# Patient Record
Sex: Female | Born: 1935 | Race: White | Hispanic: No | Smoking: Former smoker
Health system: Southern US, Community
[De-identification: ages and names within clinical notes are randomized; demographics above are authoritative.]

## PROBLEM LIST (undated history)

## (undated) DIAGNOSIS — R0600 Dyspnea, unspecified: Secondary | ICD-10-CM

## (undated) DIAGNOSIS — F419 Anxiety disorder, unspecified: Secondary | ICD-10-CM

## (undated) DIAGNOSIS — I714 Abdominal aortic aneurysm, without rupture, unspecified: Secondary | ICD-10-CM

## (undated) DIAGNOSIS — M1990 Unspecified osteoarthritis, unspecified site: Secondary | ICD-10-CM

## (undated) DIAGNOSIS — I10 Essential (primary) hypertension: Secondary | ICD-10-CM

## (undated) DIAGNOSIS — J302 Other seasonal allergic rhinitis: Secondary | ICD-10-CM

## (undated) DIAGNOSIS — Z8709 Personal history of other diseases of the respiratory system: Secondary | ICD-10-CM

## (undated) DIAGNOSIS — I499 Cardiac arrhythmia, unspecified: Secondary | ICD-10-CM

## (undated) DIAGNOSIS — E785 Hyperlipidemia, unspecified: Secondary | ICD-10-CM

## (undated) HISTORY — PX: ABDOMINAL AORTIC ANEURYSM REPAIR: SUR1152

## (undated) HISTORY — PX: INCONTINENCE SURGERY: SHX676

## (undated) HISTORY — PX: OTHER SURGICAL HISTORY: SHX169

## (undated) HISTORY — DX: Abdominal aortic aneurysm, without rupture, unspecified: I71.40

## (undated) HISTORY — PX: CARDIAC CATHETERIZATION: SHX172

## (undated) HISTORY — DX: Abdominal aortic aneurysm, without rupture: I71.4

---

## 1998-04-27 ENCOUNTER — Ambulatory Visit (HOSPITAL_COMMUNITY): Admission: RE | Admit: 1998-04-27 | Discharge: 1998-04-27 | Payer: Self-pay | Admitting: Unknown Physician Specialty

## 2006-04-16 ENCOUNTER — Encounter: Admission: RE | Admit: 2006-04-16 | Discharge: 2006-04-16 | Payer: Self-pay | Admitting: Vascular Surgery

## 2006-10-08 ENCOUNTER — Ambulatory Visit: Payer: Self-pay | Admitting: Vascular Surgery

## 2006-10-08 ENCOUNTER — Encounter: Admission: RE | Admit: 2006-10-08 | Discharge: 2006-10-08 | Payer: Self-pay | Admitting: Vascular Surgery

## 2007-04-07 ENCOUNTER — Ambulatory Visit: Payer: Self-pay | Admitting: Vascular Surgery

## 2007-09-01 ENCOUNTER — Ambulatory Visit: Payer: Self-pay | Admitting: Vascular Surgery

## 2008-03-15 ENCOUNTER — Encounter: Admission: RE | Admit: 2008-03-15 | Discharge: 2008-03-15 | Payer: Self-pay | Admitting: Vascular Surgery

## 2008-03-15 ENCOUNTER — Ambulatory Visit: Payer: Self-pay | Admitting: Vascular Surgery

## 2008-04-21 ENCOUNTER — Ambulatory Visit: Payer: Self-pay | Admitting: *Deleted

## 2008-05-02 ENCOUNTER — Ambulatory Visit: Admission: RE | Admit: 2008-05-02 | Discharge: 2008-05-02 | Payer: Self-pay | Admitting: *Deleted

## 2008-05-05 ENCOUNTER — Ambulatory Visit: Payer: Self-pay | Admitting: *Deleted

## 2008-07-11 ENCOUNTER — Ambulatory Visit (HOSPITAL_COMMUNITY): Admission: RE | Admit: 2008-07-11 | Discharge: 2008-07-11 | Payer: Self-pay | Admitting: Vascular Surgery

## 2008-07-11 ENCOUNTER — Ambulatory Visit: Payer: Self-pay | Admitting: Vascular Surgery

## 2008-08-31 ENCOUNTER — Inpatient Hospital Stay (HOSPITAL_COMMUNITY): Admission: RE | Admit: 2008-08-31 | Discharge: 2008-09-06 | Payer: Self-pay | Admitting: *Deleted

## 2008-08-31 ENCOUNTER — Ambulatory Visit: Payer: Self-pay | Admitting: Vascular Surgery

## 2008-08-31 ENCOUNTER — Encounter (INDEPENDENT_AMBULATORY_CARE_PROVIDER_SITE_OTHER): Payer: Self-pay | Admitting: *Deleted

## 2008-09-13 ENCOUNTER — Ambulatory Visit: Payer: Self-pay | Admitting: Vascular Surgery

## 2008-10-25 ENCOUNTER — Ambulatory Visit: Payer: Self-pay | Admitting: Vascular Surgery

## 2008-12-13 ENCOUNTER — Ambulatory Visit (HOSPITAL_BASED_OUTPATIENT_CLINIC_OR_DEPARTMENT_OTHER): Admission: RE | Admit: 2008-12-13 | Discharge: 2008-12-13 | Payer: Self-pay | Admitting: Urology

## 2009-04-02 ENCOUNTER — Emergency Department (HOSPITAL_BASED_OUTPATIENT_CLINIC_OR_DEPARTMENT_OTHER): Admission: EM | Admit: 2009-04-02 | Discharge: 2009-04-02 | Payer: Self-pay | Admitting: Emergency Medicine

## 2009-04-02 ENCOUNTER — Ambulatory Visit: Payer: Self-pay | Admitting: Diagnostic Radiology

## 2009-10-26 ENCOUNTER — Ambulatory Visit: Payer: Self-pay | Admitting: Vascular Surgery

## 2010-10-04 LAB — BASIC METABOLIC PANEL
BUN: 13 mg/dL (ref 6–23)
CO2: 27 mEq/L (ref 19–32)
CO2: 28 mEq/L (ref 19–32)
Calcium: 7.4 mg/dL — ABNORMAL LOW (ref 8.4–10.5)
Calcium: 7.6 mg/dL — ABNORMAL LOW (ref 8.4–10.5)
Chloride: 102 mEq/L (ref 96–112)
Chloride: 102 mEq/L (ref 96–112)
Creatinine, Ser: 0.75 mg/dL (ref 0.4–1.2)
Creatinine, Ser: 0.88 mg/dL (ref 0.4–1.2)
GFR calc Af Amer: >60 mL/min (ref >60)
GFR calc Af Amer: >60 mL/min (ref >60)
GFR calc Af Amer: >60 mL/min (ref >60)
Glucose, Bld: 125 mg/dL — ABNORMAL HIGH (ref 70–99)
Potassium: 3.2 mEq/L — ABNORMAL LOW (ref 3.5–5.1)
Sodium: 135 mEq/L (ref 135–145)

## 2010-10-04 LAB — GLUCOSE, CAPILLARY
Glucose-Capillary: 106 mg/dL — ABNORMAL HIGH (ref 70–99)
Glucose-Capillary: 113 mg/dL — ABNORMAL HIGH (ref 70–99)
Glucose-Capillary: 125 mg/dL — ABNORMAL HIGH (ref 70–99)
Glucose-Capillary: 142 mg/dL — ABNORMAL HIGH (ref 70–99)
Glucose-Capillary: 162 mg/dL — ABNORMAL HIGH (ref 70–99)
Glucose-Capillary: 179 mg/dL — ABNORMAL HIGH (ref 70–99)
Glucose-Capillary: 208 mg/dL — ABNORMAL HIGH (ref 70–99)
Glucose-Capillary: 97 mg/dL (ref 70–99)

## 2010-10-04 LAB — BLOOD GAS, ARTERIAL
Bicarbonate: 26.7 mEq/L — ABNORMAL HIGH (ref 20.0–24.0)
FIO2: 0.21 %
pCO2 arterial: 36.1 mmHg (ref 35.0–45.0)
pH, Arterial: 7.482 — ABNORMAL HIGH (ref 7.350–7.400)
pO2, Arterial: 68.6 mmHg — ABNORMAL LOW (ref 80.0–100.0)

## 2010-10-04 LAB — COMPREHENSIVE METABOLIC PANEL
ALT: 14 U/L (ref 0–35)
Alkaline Phosphatase: 39 U/L (ref 39–117)
Alkaline Phosphatase: 63 U/L (ref 39–117)
BUN: 11 mg/dL (ref 6–23)
CO2: 23 mEq/L (ref 19–32)
CO2: 24 mEq/L (ref 19–32)
Chloride: 100 mEq/L (ref 96–112)
Creatinine, Ser: 0.84 mg/dL (ref 0.4–1.2)
GFR calc non Af Amer: >60 mL/min (ref >60)
Glucose, Bld: 101 mg/dL — ABNORMAL HIGH (ref 70–99)
Glucose, Bld: 199 mg/dL — ABNORMAL HIGH (ref 70–99)
Potassium: 3 mEq/L — ABNORMAL LOW (ref 3.5–5.1)
Potassium: 3.6 mEq/L (ref 3.5–5.1)
Sodium: 137 mEq/L (ref 135–145)
Total Bilirubin: 0.7 mg/dL (ref 0.3–1.2)
Total Protein: 6.5 g/dL (ref 6.0–8.3)

## 2010-10-04 LAB — TYPE AND SCREEN

## 2010-10-04 LAB — POCT I-STAT 3, ART BLOOD GAS (G3+)
Bicarbonate: 23.2 mEq/L (ref 20.0–24.0)
O2 Saturation: 97 %
Patient temperature: 35.6
TCO2: 24 mmol/L (ref 0–100)
pCO2 arterial: 33.9 mmHg — ABNORMAL LOW (ref 35.0–45.0)
pCO2 arterial: 40.9 mmHg (ref 35.0–45.0)
pH, Arterial: 7.389 (ref 7.350–7.400)
pH, Arterial: 7.443 — ABNORMAL HIGH (ref 7.350–7.400)
pO2, Arterial: 102 mmHg — ABNORMAL HIGH (ref 80.0–100.0)
pO2, Arterial: 65 mmHg — ABNORMAL LOW (ref 80.0–100.0)

## 2010-10-04 LAB — CBC
HCT: 26.5 % — ABNORMAL LOW (ref 36.0–46.0)
HCT: 33.2 % — ABNORMAL LOW (ref 36.0–46.0)
Hemoglobin: 11.8 g/dL — ABNORMAL LOW (ref 12.0–15.0)
Hemoglobin: 14.3 g/dL (ref 12.0–15.0)
Hemoglobin: 9.2 g/dL — ABNORMAL LOW (ref 12.0–15.0)
MCHC: 34.8 g/dL (ref 30.0–36.0)
MCHC: 35.3 g/dL (ref 30.0–36.0)
MCHC: 35.3 g/dL (ref 30.0–36.0)
MCV: 88.8 fL (ref 78.0–100.0)
MCV: 89.4 fL (ref 78.0–100.0)
MCV: 90.3 fL (ref 78.0–100.0)
Platelets: 165 10*3/uL (ref 150–400)
RBC: 2.94 MIL/uL — ABNORMAL LOW (ref 3.87–5.11)
RBC: 3.38 MIL/uL — ABNORMAL LOW (ref 3.87–5.11)
RBC: 3.73 MIL/uL — ABNORMAL LOW (ref 3.87–5.11)
RBC: 3.8 MIL/uL — ABNORMAL LOW (ref 3.87–5.11)
RBC: 4.55 MIL/uL (ref 3.87–5.11)
RDW: 13.1 % (ref 11.5–15.5)
RDW: 13.8 % (ref 11.5–15.5)
WBC: 10.9 10*3/uL — ABNORMAL HIGH (ref 4.0–10.5)
WBC: 11.7 10*3/uL — ABNORMAL HIGH (ref 4.0–10.5)
WBC: 14.6 10*3/uL — ABNORMAL HIGH (ref 4.0–10.5)

## 2010-10-04 LAB — APTT
aPTT: 34 seconds (ref 24–37)
aPTT: 38 seconds — ABNORMAL HIGH (ref 24–37)

## 2010-10-04 LAB — POCT I-STAT 7, (LYTES, BLD GAS, ICA,H+H)
Acid-Base Excess: 2 mmol/L (ref 0.0–2.0)
Bicarbonate: 27.5 mEq/L — ABNORMAL HIGH (ref 20.0–24.0)
O2 Saturation: 100 %
Sodium: 137 mEq/L (ref 135–145)
TCO2: 29 mmol/L (ref 0–100)

## 2010-10-04 LAB — PROTIME-INR
INR: 1.1 (ref 0.00–1.49)
INR: 1.2 (ref 0.00–1.49)
Prothrombin Time: 14 seconds (ref 11.6–15.2)
Prothrombin Time: 15.7 seconds — ABNORMAL HIGH (ref 11.6–15.2)
Prothrombin Time: 16.2 seconds — ABNORMAL HIGH (ref 11.6–15.2)

## 2010-10-04 LAB — URINALYSIS, ROUTINE W REFLEX MICROSCOPIC
Glucose, UA: NEGATIVE mg/dL
Ketones, ur: NEGATIVE mg/dL
Protein, ur: NEGATIVE mg/dL

## 2010-10-04 LAB — AMYLASE: Amylase: 86 U/L (ref 27–131)

## 2010-10-08 LAB — POCT I-STAT, CHEM 8
BUN: 13 mg/dL (ref 6–23)
Chloride: 102 mEq/L (ref 96–112)
Creatinine, Ser: 0.8 mg/dL (ref 0.4–1.2)
Glucose, Bld: 100 mg/dL — ABNORMAL HIGH (ref 70–99)
HCT: 43 % (ref 36.0–46.0)
Potassium: 3.1 mEq/L — ABNORMAL LOW (ref 3.5–5.1)

## 2010-11-06 NOTE — Assessment & Plan Note (Signed)
OFFICE VISIT   Mary Merritt, Mary Merritt  DOB:  11-19-1927                                       04/21/2008  EAVWU#:[id number]   The patient is a 77 year old female followed by Dr. Edilia Bo since 2006  with a juxtarenal abdominal aortic aneurysm.  At that time her aneurysm  measured 4.2 cm in maximal diameter.  The aneurysm has shown steady  enlargement and now measures 5.7 cm in maximal diameter.  She has  remained free of any symptoms.  Denies significant abdominal or back  discomfort.  No recent chest pain, shortness of breath, palpitations or  chest pressure.   No tobacco use since 1990.   Dr. Edilia Bo did hear a carotid bruit and carotid Doppler carried out at  this time reveals mild 40-59% bilateral carotid stenosis and left  subclavian steal is noted.   PHYSICAL EXAMINATION:  Evaluation today reveals a well-appearing 79-year-  old female.  Moderate obesity.  BP 152/80, pulse 64 per minute.  Heart  sounds normal without murmurs.  Chest is clear.  Abdomen soft,  nontender.  AAA palpable.  No organomegaly or other masses felt.  2+  femoral pulses bilaterally.   I do see significant enlargement in this aneurysm and at 5.7 cm  increasing risk of rupture is becoming a reality.  I have suggested we  go ahead and get some pulmonary function tests and a Cardiolite  evaluation and that once these have been completed to have her return to  the office at that time for review of the results and further plans.   Balinda Quails, M.D.  Electronically Signed   PGH/MEDQ  D:  04/21/2008  T:  04/22/2008  Job:  [id number]   cc:   Di Kindle. Edilia Bo, M.D.

## 2010-11-06 NOTE — Assessment & Plan Note (Signed)
OFFICE VISIT   Mary Merritt, Mary Merritt  DOB:  05/01/1928                                       10/26/2009  ZOXWR#:[id number]   I saw the patient in the office today for continued followup after  previous repair of her pararenal abdominal aortic aneurysm with  reimplantation of left renal artery.  This is a pleasant 77 year old  woman who had presented with a juxtarenal aneurysm that had enlarged to  5.7 cm.  Given the risk of rupture elective repair was recommended.  The  left renal artery actually came off of the aneurysm.  She comes in for a  yearly followup visit.  Since I saw her last she has had no abdominal or  back pain.  She has been doing her normal routine without any specific  complaints.   REVIEW OF SYSTEMS:  She has had no chest pain, chest pressure,  palpitations or arrhythmias.  She has had no productive cough,  bronchitis, asthma or wheezing.   PHYSICAL EXAMINATION:  This is a pleasant 77 year old woman who appears  her stated age.  Her blood pressure is 153/78, heart rate is 55,  saturation 96%.  Her lungs are clear bilaterally to auscultation.  Her  abdominal incision is well-healed.  She has normal pitched bowel sounds.  She has palpable femoral pulses and warm and well-perfused feet with  palpable pedal pulses.   Overall I am pleased with her progress.  I have instructed her that she  will need to receive prophylactic antibiotics for any invasive  procedures to lower her risk of graft infection.  At this point we will  see her back p.r.n.  She knows she can call at any time if any new  vascular issues arise.  Of note, she did have an arterial Doppler study  today which showed biphasic Doppler signals in both feet with an ABI of  92% on the right and 100% on the left.     Di Kindle. Edilia Bo, M.D.  Electronically Signed   CSD/MEDQ  D:  10/26/2009  T:  10/27/2009  Job:  [phone]

## 2010-11-06 NOTE — Assessment & Plan Note (Signed)
OFFICE VISIT   Mary, Merritt  DOB:  1928/01/09                                       09/13/2008  ZOXWR#:[id number]   I saw the patient in the office today for followup after repair of her  pararenal aneurysm with reimplantation of the left renal artery.  I have  been following her with a juxtarenal aneurysm that had enlarged to 5.7  cm.  We felt we might potentially require a thoracoabdominal exposure  and so Dr. Madilyn Fireman assisted on the case.  However, I was able to clamp  between the superior mesenteric artery and the right renal artery.  The  left renal artery came off of the aneurysm and this was reimplanted on  to the graft.  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  She comes in for her first outpatient visit.   Overall she has been doing quite well and has no specific complaint.  Her appetite is returning and she is gradually regaining her strength.   PHYSICAL EXAMINATION:  Lungs are clear bilaterally to auscultation.  On  cardiac exam she has a regular rate and rhythm.  Her incisions are all  healing nicely.  She has normal pitched bowel sounds.  She has no  significant lower extremity swelling.   Overall I am pleased with her progress.  She will follow up with her  primary medical doctor concerning following her blood pressures.  Now  that she has had her left renal artery bypass she may require less of  her blood pressure medication as she did have significant left renal  artery stenosis.  Today in the office her blood pressure was 120/81,  heart rate was 69.   I plan on seeing her back in 6 weeks.  She knows to call sooner if she  has problems.  I did explain the importance of receiving prophylactic  antibiotics for any invasive procedures.   Di Kindle. Edilia Bo, M.D.  Electronically Signed   CSD/MEDQ  D:  09/13/2008  T:  09/14/2008  Job:  [id number]

## 2010-11-06 NOTE — Assessment & Plan Note (Signed)
OFFICE VISIT   NELL, GALES  DOB:  [birth year]                                       04/07/2007  ZOXWR#:[id number]   I saw the patient in the office today for continued followup of her  abdominal aortic aneurysm.  I last saw her in April of this year.  When  I saw her at that time, she had had a CT scan, which showed that the  maximum diameter was 5.2 cm.  This has not changed significantly over  the last 6 months, and we elected to go with an abdominal ultrasound at  this point.  Of note, the aneurysm is noted to be suprarenal, and if she  ultimately requires repair, she would likely require a thoracoabdominal  approach.   Since her last visit, she has had no significant abdominal pain, back  pain, or chest pain.   PAST MEDICAL HISTORY:  Significant for hypertension and  hypercholesterolemia.  She denies any history of diabetes, history of  previous myocardial infarction, history of congestive heart failure, or  history of COPD.   FAMILY HISTORY:  There is no history of premature cardiovascular  disease.   SOCIAL HISTORY:  She is widowed.  She has 3 children.  She quit tobacco  in 1990.   REVIEW OF SYSTEMS:  She has had no recent weight loss, weight gain, or  problems with appetite.  She has had no chest pain, chest pressure, palpitations, or arrhythmias.  She has occasional pain with walking, but has had no rest pain and no  history of non-healing ulcers.  She has had no history of stroke, TIA, expressive or receptive aphasia,  or amaurosis fugax.  Pulmonary, GI, GU, neuro, orhto, skin, psychiatric, and hematologic  review of systems are all unremarkable.   PHYSICAL EXAMINATION:  Her blood pressure is 165/90 on the right, 166/93  on the left.  Heart rate is 61.  She has a right carotid bruit.  Lungs  are clear bilaterally to auscultation.  On cardiac exam, she has a  regular rate and rhythm.  She has diminished femoral pulses.  I could  not  palpate pedal pulses.  However, both feet appear adequately perfused  without ischemic ulcers.  She has no significant lower extremity  swelling.  Neurologic exam is nonfocal.   I reviewed her ultrasound.  It shows the maximum diameter of her  aneurysm is 5.2 cm and is, thus, not changed compared with the study 6  months ago.  She understands that we would generally not consider  elective repair unless it reached 5.5 cm in maximum diameter, and given  her age and the fact that this would likely require a thoracoabdominal  approach, I might even not consider repair at that point.  With regard  to her carotid bruit, she states that she has had a recent duplex scan  within the last few months, and that this is followed by Dr. Andi Devon,  reporting that the duplex did not show any significant stenosis.  I plan  on seeing her back in 6 months with a followup CT scan.  She knows to  call sooner if she has problems.   Di Kindle. Edilia Bo, M.D.  Electronically Signed   CSD/MEDQ  D:  04/07/2007  T:  04/08/2007  Job:  [id number]

## 2010-11-06 NOTE — Op Note (Signed)
Mary Merritt, Mary Merritt                  ACCOUNT NO.:  [id number]   MEDICAL RECORD NO.:  [id number]          PATIENT TYPE:  INP   LOCATION:  2314                         FACILITY:  [facility]   PHYSICIAN:  Di Kindle. Edilia Bo, M.D.DATE OF BIRTH:  May 09, 1928   DATE OF PROCEDURE:  08/31/2008  DATE OF DISCHARGE:                               OPERATIVE REPORT   PREOPERATIVE DIAGNOSIS:  Pararenal abdominal aortic aneurysm.   POSTOPERATIVE DIAGNOSIS:  Pararenal abdominal aortic aneurysm.   PROCEDURE:  Repair of pararenal abdominal aortic aneurysm and  reimplantation of the left renal artery.   SURGEON:  Di Kindle. Edilia Bo, MD   ASSISTANT:  Balinda Quails, MD and Wilmon Arms, PA   INDICATIONS:  This is a 77 year old woman who had been following with a  juxtarenal abdominal aortic aneurysm.  This had enlarged significantly  to 5.7 cm of maximum diameter and given the risk of rupture, elective  repair was recommended.  Of note, the aneurysm extended up to the level  of the right renal artery and the left renal artery actually came off of  the aneurysm and looked like, she would potentially require  thoracoabdominal exposure in order to control above the aneurysm.  To  repair this, initially, we prepared for possible thoracoabdominal  exposure.  The patient was seen preoperatively by Dr. Madilyn Fireman and  evaluated.  The procedure and potential complications had been discussed  with the patient preoperatively.  All of her questions were answered,  and she was agreeable to proceed.   TECHNIQUE:  The patient was taken to the operating room after central  line arterial line and a lumbar drain were placed by Anesthesia.  The  patient was positioned with the left side bumped up in anticipation of  partial thoracotomy.  The abdomen, groins, and thigh were prepped and  draped in usual sterile fashion.  The abdomen was entered through a  midline incision.  Upon careful exploration, there was no other  intra-  abdominal pathology noted short of the juxtarenal aneurysm.  Transverse  colon was reflected superiorly and the small bowel reflected to the  right retroperitoneal tissue was divided and the aneurysm dissected free  up to the level of the renal vein.  The aneurysm extended above this  level and we dissected above the renal vein up to the level of the  superior mesenteric artery.  The right renal artery was identified and  an adrenal artery above this.  There was an area of essentially normal  aorta between the right renal artery and the SMA were we could clamp  proximally.  Distally, the left common iliac artery was controlled with  umbilical tapes that it could be ligated.  There was significant plaque  within the iliacs bilaterally and we elected to do an aortofemoral  graft.  On the right side, the common iliac artery above the bifurcation  of the iliac was controlled with an umbilical tape so that this too  could be ligated.  Again, there was significant plaque here, which was  the reason for doing the aortofemoral bypass.  The left renal artery was  then identified on the left or came off with the aneurysm.  The patient  was heparinized and also received 25 g of mannitol.  The common iliac  arteries were ligated bilaterally with the umbilical tapes.  The  proximal clamp was then placed below the level of the superior  mesenteric artery and above the right renal artery.  The right renal  artery was controlled with blue vessel loop and the left renal artery  was controlled with blue vessel loop.  The aneurysm was then entered and  the laminated clot removed.  There were really no backbleeding lumbars.  The aorta was teed off approximately just at the level of the right  renal artery.  The 18 x 9 graft was then brought to the field, cut to  appropriate length for anastomosis to the juxtarenal aorta.  Of note, in  anticipation of bypass to the left renal artery, we had sewn a 5-mm  PTFE  graft onto the inter lateral aspect of the aortic graft to save ischemic  time on the left kidney.  This had been clamped.  The graft was then  sewn end-to-end to the juxtarenal aorta right at the level of the right  renal artery.  The left renal artery had been divided and clamped for  later bypass.  The stitches on the right side essentially encompass the  orifice of the right renal artery preserving flow into the right renal  artery.  Proximal anastomosis was then tested and was hemostatic.  Attention was then turned to reperfusion of the left kidney.  It looked  like there was enough length on the left renal artery for simple  reimplantation into the graft and the 5 mm graft was a significant size  mismatch and would have been difficult to have this graft lie  appropriately, so we left it to ligate this previously implanted graft,  and then reimplanted the left renal artery onto the aortic graft.  The  aortic graft was clamped proximally and distally.  A 5 mm punch was used  to create the graftotomy, then the vein was sewn end-to-side to the  graft using continuous 6-0 Prolene suture.  At the completion, flow was  reestablished to the left kidney.  We previously reestablished flow to  the right kidney by clamping below this level.  Next, using a two-team  approach, each respective limb of the graft were brought down for  anastomosis to the femoral arteries.  These had previously been exposed  through longitudinal incisions, common femoral, deep femoral artery, and  superficial femoral arteries had all been controlled.  On the right  side, the common femoral, deep femoral artery, superficial femoral  artery were clamped and a longitudinal arteriotomy made extending  slightly onto the superficial femoral artery.  The right limb of the  grafts cut to appropriate length, spatulated and sewn end to side to the  artery using continuous 5-0 Prolene suture.  Prior to completing this   anastomosis, the artery was back bled and flushed appropriately and  anastomosis completed.  Flow was reestablished to the right leg.  Likewise on the left side longitudinal arteriotomy was made over the  proximal superficial femoral as there was a significant plaque in the  common femoral artery.  The grafts cut to appropriate length, spatulated  and sewn end-to-side to the superficial femoral artery proximally using  continuous 5-0 Prolene suture.  Again, prior to completing this  anastomosis, the artery  was back bled and flushed appropriately and the  anastomosis completed.  Flow was reestablished to the left leg, also the  patient tolerated from a hemodynamic standpoint.  Next, the wounds were  inspected.  There appeared to be some backbleeding from the left common  iliac artery, and therefore, this was oversewn with a 2-0 Prolene  suture.  Next, the hemostasis was obtained and the aneurysm sac closed  over the graft using continuous 2-0 Vicryl suture.  There was excellent  Doppler flow in both the right renal artery and left renal artery to  completion.  Colon was inspected and appeared well perfused.  The  abdominal contents returned to normal position after the retroperitoneal  was closed with running #2 Vicryl suture.  Next, the abdominal contents  returned to the normal position.  The NG tube was checked and then the  fascial layer was closed with two #1 PDS sutures.  The subcutaneous  tissue was closed with two 3-0 Vicryls.  The skin was closed with  staples.  Next, hemostasis was obtained in the groins.  The groins were  closed with a deep layer of 2-0 Vicryl.  Subcutaneous layer of 2-0  Vicryl, and the skin closed with 4-0 subcuticular stitch.  Sterile  dressing was applied.  The patient tolerated the procedure well was  transferred to the recovery room in satisfactory condition.  All needle  and sponge counts were correct.      Di Kindle. Edilia Bo, M.D.  Electronically  Signed     CSD/MEDQ  D:  08/31/2008  T:  09/01/2008  Job:  [id number]

## 2010-11-06 NOTE — Discharge Summary (Signed)
Mary Merritt, Mary Merritt NO.:  [id number]   MEDICAL RECORD NO.:  [id number]          PATIENT TYPE:  INP   LOCATION:  2013                         FACILITY:  [facility]   PHYSICIAN:  Di Kindle. Edilia Bo, M.D.DATE OF BIRTH:  [birth year]   DATE OF ADMISSION:  08/31/2008  DATE OF DISCHARGE:  09/06/2008                               DISCHARGE SUMMARY   REASON FOR ADMISSION:  Pararenal abdominal aortic aneurysm.   HISTORY OF PRESENT ILLNESS:  This is a pleasant 77 year old woman who I  have been following with a pararenal aneurysm.  This had enlarged to 6  cm and given the enlargement and the size we elected to proceed with  elective repair.  There was some concern that she might require  thoracoabdominal exposure for the aneurysm which was pararenal.  She was  evaluated by Dr. Madilyn Fireman preoperatively also in anticipation of this.   Her past medical history is significant for hypertension and  hypercholesterolemia.  She underwent preoperative cardiac clearance and  is brought in for elective surgery.  The remainder of the history and  physical is as dictated without addition or deletion.   HOSPITAL COURSE:  The patient was admitted on August 31, 2008, and  underwent repair of pararenal abdominal aortic aneurysm and  reimplantation of left renal artery.  I was able to clamp between the  superior mesenteric artery and the right renal artery.  She, therefore,  did not require a thoracoabdominal exposure.  The left renal artery was  reimplanted onto the graft.   Postoperatively, she went to the Intensive Care Unit and remained  hemodynamically stable.  On postoperative day #1, she had good bowel  sounds and therefore her NG tube was discontinued as it was not draining  much.  She was slowly mobilized and was transferred to a regular bed.  She was continued on Zinacef for 24 hours.  By postoperative #3, she was  started on sips of fulls.  She continued to make good progress.   Her  incisions looked fine.  By postoperative day #6, she was tolerating a  regular diet.  Her bowels were working.  She was ambulating without  difficulty.  She really was not requiring any pain medication.  She was  discharged to home with instructions to follow up in 1 week for staple  removal.   DISCHARGE DIAGNOSIS:  Pararenal abdominal aortic aneurysm.   PROCEDURES:  On August 31, 2008, repair of pararenal abdominal aortic  aneurysm and reimplantation of left renal artery.   SECONDARY DIAGNOSES:  Hypertension and hypercholesterolemia.   DISCHARGE MEDICATIONS:  1. Metoprolol 50 mg p.o. b.i.d.  2. Lovastatin 40 mg p.o. nightly.  3. Hydrochloride 25 mg p.o. daily.  4. Amlodipine 5 mg p.o. daily.  5. Oxybutynin 10 mg p.o. daily.  6. Aspirin 81 mg p.o. daily.  7. Tylox 1 q.4 h. p.r.n. pain.   CONDITION ON DISCHARGE:  Good.   DISPOSITION:  To home.      Di Kindle. Edilia Bo, M.D.  Electronically Signed     CSD/MEDQ  D:  09/06/2008  T:  09/06/2008  Job:  [id number]

## 2010-11-06 NOTE — Assessment & Plan Note (Signed)
OFFICE VISIT   Mary Merritt, Mary Merritt  DOB:  Sep 02, 1927                                       03/15/2008  EAVWU#:[id number]   I saw the patient in the office today for continued followup of her  juxtarenal abdominal aortic aneurysm.  I have been following her with  the aneurysm since September 2006 when her aneurysm was 4.2 cm in  maximum diameter.  On our most recent followup study in March of this  year, I measured the aneurysm at 5.2 cm in maximum diameter.  She comes  in for a 75-month followup study.  Since I saw her last she has had no  history of abdominal pain.  She does have some mild chronic low back  pain.  This has not changed in character all.   REVIEW OF SYSTEMS:  She has had no recent chest pain, chest pressure,  palpitations or arrhythmias.  She has had no bronchitis, asthma or  wheezing.   PAST MEDICAL HISTORY:  Significant for hypertension and  hypercholesterolemia.  She she denies any history of previous myocardial  infarction, history of congestive heart failure history of COPD.   SOCIAL HISTORY:  She quit tobacco in 1990.   PHYSICAL EXAMINATION:  This is a pleasant 77 year old woman who appears  her stated age.  Her blood pressure is 149/82, heart rate is 56.  Her  neck is supple.  She does have soft bilateral carotid bruits.  Lungs are  clear bilaterally to auscultation.  Cardiac exam, she has a regular rate  and rhythm.  Abdomen:  Soft and nontender.  Aneurysm is palpable and  nontender.  She has palpable femoral pulses with a warm well-perfused  feet.   Her CT scan today shows, by my measurement, that the aneurysm has  enlarged to 5.7 cm in maximum diameter.  The aneurysm appears to extend  above the level of the right renal artery and fairly close to the  superior mesenteric artery.  The main left renal artery was not well  visualized.  There appears to be an accessory renal artery on the left  which supplies some of the cortex.   I  have explained to the patient that I think the aneurysm has enlarged  slightly from 5.2 to 5.7 cm.  Unfortunately, I think repair of his  aneurysm would require a thoracoabdominal approach with the significant  risks associated with this.  I think she would also be at increased risk  because of her age, although she is otherwise in fairly good health.  All things considered, given the nature the aneurysm, I would favor  continued observation for now.  However, I have recommended we allow her  see Dr. Madilyn Fireman, who does the thoracoabdominal exposures in our group for  his input.   Of note, she does have carotid bruits and I cannot see that she has had  a recent carotid duplex scan, so will also arrange for her to get a  carotid duplex which she comes for her next visit.   Di Kindle. Edilia Bo, M.D.  Electronically Signed   CSD/MEDQ  D:  03/15/2008  T:  03/16/2008  Job:  [id number]

## 2010-11-06 NOTE — Op Note (Signed)
Mary Merritt, Mary Merritt                  ACCOUNT NO.:  [id number]   MEDICAL RECORD NO.:  [id number]          PATIENT TYPE:  AMB   LOCATION:  SDS                          FACILITY:  [facility]   PHYSICIAN:  Di Kindle. Edilia Bo, M.D.DATE OF BIRTH:  Sep 24, 1927   DATE OF PROCEDURE:  07/11/2008  DATE OF DISCHARGE:  07/11/2008                               OPERATIVE REPORT   PREOPERATIVE DIAGNOSIS:  Juxtarenal abdominal aortic aneurysm.   POSTOPERATIVE DIAGNOSIS:  Juxtarenal abdominal aortic aneurysm.   PROCEDURE:  1. Ultrasound-guided access of the right common femoral artery.  2. Aortogram with bilateral lower extremity runoff.   SURGEON:  Di Kindle. Edilia Bo, MD   ANESTHESIA:  Local.   TECHNIQUE:  The patient was taken to the PV Lab and the groins were  prepped and draped in the usual sterile fashion.  After the skin was  infiltrated with 1% lidocaine and under ultrasound guidance, the right  common femoral artery was cannulated and a guidewire was introduced into  the infrarenal aorta under fluoroscopic control.  A 5-French sheath was  introduced over the wire and then a long pigtail catheter was positioned  in the descending thoracic aorta.  An aortogram was obtained in the  lateral projection to evaluate the mesenteric vessels.  The catheter was  then pulled down at the level of the renal arteries and aortogram was  obtained.  Oblique iliac projections were obtained and then with the  catheter positioned above the bifurcation, bilateral lower extremity  runoff films were obtained.   FINDINGS:  The thoracic aorta is slightly ectatic, but not frankly  aneurysmal.  The celiac axis and superior mesenteric artery are widely  patent.  The inferior mesenteric artery appears patent, although the  origin is not well visualized.  The aneurysm is juxtarenal.  The SMA  originates approximately a centimeter above the right renal artery and  the aneurysm extends up to the level of the right  renal artery.  The  aneurysm again is juxtarenal and it appears that the left renal artery  originates from the aneurysm itself proximally.  There may also be a  small accessory left renal artery.  The size of the aneurysm cannot be  determined by this study.  There is ectasia of the right common iliac  artery with some plaque present or laminated thrombus in the proximal  right common iliac artery.  There is stenosis in the proximal left  common iliac artery.  Below this level, the common iliac arteries are  patent.  The left hypogastric artery is occluded.  The right hypogastric  artery is patent.  Common femoral, superficial femoral, deep femoral,  and popliteal arteries are patent bilaterally.  Tibial vessels are  patent, although the right anterior tibial artery is not visualized  because of the positioning of the leg.   CONCLUSIONS:  Juxtarenal aneurysm as described above with the common  iliac artery disease bilaterally as described above.      Di Kindle. Edilia Bo, M.D.  Electronically Signed     CSD/MEDQ  D:  07/11/2008  T:  07/11/2008  Job:  [id number] 

## 2010-11-06 NOTE — Assessment & Plan Note (Signed)
OFFICE VISIT   GRETEL, CANTU  DOB:  [birth year]                                       05/05/2008  ZOXWR#:[id number]   The patient returned to the office today with results of her recent  Cardiolite and pulmonary function tests.   Cardiolite is low risk with normal myocardial perfusion imaging.   Pulmonary function tests are good with FEV1 2.26 liters and vital  capacity 2.6 liters.   These results certainly are acceptable for a thoracoabdominal operation.  I have reviewed the potential risks of an operative procedure as this  aneurysm continues to enlarge.  These include but are not limited to  permanent or temporary renal failure, hemodialysis, bleeding,  transfusion, DVT, pulmonary embolus, myocardial infarction, stroke,  paraplegia or other major complication.  She is indeed willing to go  ahead with surgery and I will discuss this further with Dr. Edilia Bo.   Balinda Quails, M.D.  Electronically Signed   PGH/MEDQ  D:  05/05/2008  T:  05/06/2008  Job:  [id number]   cc:   Di Kindle. Edilia Bo, M.D.

## 2010-11-06 NOTE — Assessment & Plan Note (Signed)
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  I last saw  her on 09/13/2008 and she was doing quite well except for some poor  appetite.   Since I saw her last this has improved.  She has been gradually  increasing her activities and is eating a regular diet.  Her bowels are  functioning normally.   PHYSICAL EXAMINATION:  On examination her lungs are clear bilaterally to  auscultation.  On cardiac exam she has a regular rate and rhythm.  She  has palpable femoral pulses and warm, well-perfused feet.  She has  normal pitched bowel sounds.   Overall I am pleased with her progress and I will see her back in 1  year.  She knows to call sooner if she has problems.   Di Kindle. Edilia Bo, M.D.  Electronically Signed   CSD/MEDQ  D:  10/25/2008  T:  10/26/2008  Job:  [id number]

## 2010-11-06 NOTE — Procedures (Signed)
CAROTID DUPLEX EXAM   INDICATION:  Bilateral carotid bruit.   HISTORY:  Diabetes:  No.  Cardiac:  Known aortic aneurysm.  Hypertension:  Yes.  Smoking:  No.  Previous Surgery:  CV History:  Patient reports no cerebrovascular symptoms.  Dr. heard a  bruit bilaterally.  Amaurosis Fugax No, Paresthesias No, Hemiparesis No                                       RIGHT             LEFT  Brachial systolic pressure:         198               158  Brachial Doppler waveforms:         Triphasic         Monophasic  Vertebral direction of flow:        Antegrade         Retrograde  DUPLEX VELOCITIES (cm/sec)  CCA peak systolic                   71                77  ECA peak systolic                   78                99  ICA peak systolic                   131               132  ICA end diastolic                   30                32  PLAQUE MORPHOLOGY:                  Calcified         Mixed  PLAQUE AMOUNT:                      Mild              Mild  PLAQUE LOCATION:                    Proximal ICA      Proximal ICA   IMPRESSION:  1. A 40-59% ICA stenosis bilaterally.  2. Left subclavian steal syndrome.       ___________________________________________  P. Liliane Bade, M.D.   MC/MEDQ  D:  04/21/2008  T:  04/21/2008  Job:  [id number]

## 2010-11-06 NOTE — Assessment & Plan Note (Signed)
OFFICE VISIT   Mary Merritt, Mary Merritt  DOB:  01-20-1928                                       09/01/2007  ZOXWR#:[id number]   I saw the patient in the office today for continued followup of her  abdominal aortic aneurysm.  I had originally seen her in consultation in  September 2006, at which time the aneurysm was 4.2 cm in maximum  diameter.  On her most recent followup study in October 2008, the  maximum diameter of her aneurysm was 5.2 cm.  She had a followup CT scan  at [facility], which showed, according to the report, a  fusiform aneurysm of the infrarenal aorta with a maximum AP diameter  distally of 5.2 cm.  This is increased slightly compared to the study  based on a previous ultrasound.  More proximally, the maximum diameter  was noted to be 5.6 cm.  Based on a previous CAT scan, it is known that  she has a juxtarenal aneurysm, which significantly complicates options  for repair.  On her CT scan, she has an absent left renal artery.   Since I saw her last in October of 2008, she has had no significant  abdominal or back pain.  There has been no significant change in her  medical history.  She has a history of hypertension and  hypercholesterolemia.  She denies any history of diabetes, history of  previous myocardial infarction, history of congestive heart failure, or  history of COPD.   SOCIAL HISTORY:  She quit tobacco in 1990.   REVIEW OF SYSTEMS:  She has had no recent chest pain, chest pressure,  palpitations, or arrhythmias.  She has had no bronchitis, asthma, or wheezing.   PHYSICAL EXAMINATION:  This is a pleasant 77 year old woman who appears  her stated age.  Blood pressure is 126/78, heart rate is 72.  HEENT, she  does have a left carotid bruit.  There is no cervical lymphadenopathy.  Neck is supple.  Lungs are clear bilaterally to auscultation.  On  cardiac exam, she has a regular rate and rhythm.  Her abdomen is  somewhat  obese.  Her aneurysm is palpable and nontender.  She has normal-  pitched bowel sounds.  She has palpable femoral pulses and peripheral  pulses bilaterally.  She has no significant lower extremity swelling.  Neurologic exam is nonfocal.   Based on her CT report, it appears that there has been some slight  enlargement of her juxtarenal aneurysm to a maximum diameter of 5.6 cm.  However, I would like to review this study before confirming this.  Sometimes, if there is some tortuosity of the aorta, this can be  misinterpreted in terms of sizing.  The accepted recommend for repair of  an abdominal aortic aneurysm in a normal-risk patient is 5.5 cm.  However, given that her aneurysm is juxtarenal and could potentially  require a thoracoabdominal approach for repair, and given her age, even  given the slight enlargement if this is in fact the case, I probably  would not recommend elective repair at this time.  Clearly, she is not a  candidate for endovascular repair, as this is a juxtarenal aneurysm.  In  reviewing her previous CAT scan, the problem is that there is really no  normal aorta below the level of the right renal artery.  The  superior  mesenteric artery comes off quite near the renal.  In short, I really do  not see any decent area to sew the proximal graft.  If the aneurysm does  continue to enlarge, I will get an opinion from Dr. Madilyn Fireman, who has been  involved with our thoracoabdominal repairs.  I will give her a call once  I have seen her actual films (her home number is [phone]).  If there  has been no significant change in the size of the aneurysm, then I will  simply see her back in 6 months with a followup CT angio.   Di Kindle. Edilia Bo, M.D.  Electronically Signed   CSD/MEDQ  D:  09/01/2007  T:  09/02/2007  Job:  [id number]   cc:   Gabriel Earing, M.D.

## 2010-11-06 NOTE — Op Note (Signed)
[name], Mary Merritt                  ACCOUNT NO.:  [id number]   MEDICAL RECORD NO.:  [id number]          PATIENT TYPE:  AMB   LOCATION:  [facility]                         FACILITY:  [facility]   PHYSICIAN:  Excell Seltzer. Annabell Howells, M.D.    DATE OF BIRTH:  07/08/1927   DATE OF PROCEDURE:  12/13/2008  DATE OF DISCHARGE:                               OPERATIVE REPORT   PROCEDURE:  SPARC sling.   PREOPERATIVE DIAGNOSIS:  Stress incontinence.   POSTOPERATIVE DIAGNOSIS:  Stress incontinence.   SURGEON:  Excell Seltzer. Annabell Howells, M.D.   ANESTHESIA:  General.   DRAIN:  16-French Foley catheter.   COMPLICATIONS:  None.   INDICATIONS:  [name] is a 77 year old white female with stress  incontinence who is to undergo SPARC sling.   FINDINGS/PROCEDURE:  She was given Cipro.  She was taken to the  operating room and general anesthetic was induced.  She was placed in  lithotomy position.  Her mons was shaved.  She was prepped with Betadine  solution and draped in the usual sterile fashion.  A 16-French Foley was  placed and the balloon was filled with 10 mL sterile fluid.  The bladder  was drained.  The anterior vaginal wall was infiltrated with  approximately 5 mL of 1% lidocaine with epinephrine at the mid urethral  level.  An incision was made vertically over the mid urethra and the  mucosa was elevated off the pubic urethral fascia laterally sufficient  to allow placement of a fingertip.  Two small stab wounds were placed at  the upper margin of the pubis, one on each side 2 cm from the midline.  Once the incisions had been made the [facility] trocars were passed, first on  the right then on the left.  The trocar was brought down to the top of  the pubis, walked along the back to the pubis and then brought into the  vaginal incision under finger guidance with care taken to push the  urethra to the contralateral side.  After the trocars were passed,  cystoscopic inspection with a 70 degree lens and a 22-French scope  revealed no evidence of bladder wall or urethral injury.  The bladder  wall was otherwise unremarkable as were the ureters.  After ensuring no  bladder injury, the [facility] mesh was secured to each trocar and pulled up  into proper position.  Repeat cystoscopy once again revealed no evidence  of bladder wall injury.  The sling was tensioned and the sheaths were  removed.  Once the tensioning was confirmed the anterior vaginal wall  was closed with a running locked 2-0 Vicryl suture.  The long ends of  the abdominal ends were trimmed and the abdominal incisions were  cleaned, dried, and closed with Dermabond.  The Foley catheter  was placed to straight drainage.  A 2-inch Iodoform vaginal pack was  placed.  The patient was taken down from lithotomy position.  Her  anesthetic was reversed.  She was moved to the recovery room in stable  condition.  There were no complications.  Excell Seltzer. Annabell Howells, M.D.  Electronically Signed     JJW/MEDQ  D:  12/13/2008  T:  12/13/2008  Job:  [id number]   cc:   Gabriel Earing, M.D.  Fax: [phone]   P. Liliane Bade, M.D.  [address]  [place]  [place] [address]

## 2010-11-06 NOTE — Procedures (Signed)
DUPLEX ULTRASOUND OF ABDOMINAL AORTA   INDICATION:  Followup evaluation of known abdominal aortic aneurysm.   HISTORY:  Diabetes:  No.  Cardiac:  No.  Hypertension:  Yes.  Smoking:  No.  Connective Tissue Disorder:  Family History:  Previous Surgery:   DUPLEX EXAM:         AP (cm)                   TRANSVERSE (cm)  Proximal             4.0 cm.                   4.1 cm.  Mid                  4.8 cm.                   5.2 cm.  Distal               3.7 cm.                   3.2 cm.  Right Iliac          0.9 cm.                   0.9 cm.  Left Iliac           0.7 cm.                   0.9 cm.   PREVIOUS:  Date: October 02, 2005.  AP:  4.5.  TRANSVERSE:  4.5.   IMPRESSION:  Abdominal aortic aneurysm is larger than previously  recorded.  It appears to have large amount of mural thrombus within.   ___________________________________________  Di Kindle. Edilia Bo, M.D.   MC/MEDQ  D:  04/07/2007  T:  04/08/2007  Job:  [id number]

## 2012-01-21 ENCOUNTER — Encounter: Payer: Self-pay | Admitting: Vascular Surgery

## 2013-09-22 ENCOUNTER — Emergency Department (HOSPITAL_BASED_OUTPATIENT_CLINIC_OR_DEPARTMENT_OTHER)
Admission: EM | Admit: 2013-09-22 | Discharge: 2013-09-22 | Disposition: A | Discharge status: Home / Self Care | Payer: Medicare Other | Attending: Emergency Medicine | Admitting: Emergency Medicine

## 2013-09-22 ENCOUNTER — Encounter (HOSPITAL_BASED_OUTPATIENT_CLINIC_OR_DEPARTMENT_OTHER): Payer: Self-pay | Admitting: Emergency Medicine

## 2013-09-22 ENCOUNTER — Emergency Department (HOSPITAL_BASED_OUTPATIENT_CLINIC_OR_DEPARTMENT_OTHER): Payer: Medicare Other

## 2013-09-22 DIAGNOSIS — Y9389 Activity, other specified: Secondary | ICD-10-CM | POA: Insufficient documentation

## 2013-09-22 DIAGNOSIS — Y9289 Other specified places as the place of occurrence of the external cause: Secondary | ICD-10-CM | POA: Insufficient documentation

## 2013-09-22 DIAGNOSIS — R296 Repeated falls: Secondary | ICD-10-CM | POA: Insufficient documentation

## 2013-09-22 DIAGNOSIS — I10 Essential (primary) hypertension: Secondary | ICD-10-CM | POA: Insufficient documentation

## 2013-09-22 DIAGNOSIS — E785 Hyperlipidemia, unspecified: Secondary | ICD-10-CM | POA: Insufficient documentation

## 2013-09-22 DIAGNOSIS — S2249XA Multiple fractures of ribs, unspecified side, initial encounter for closed fracture: Secondary | ICD-10-CM | POA: Insufficient documentation

## 2013-09-22 DIAGNOSIS — S2232XA Fracture of one rib, left side, initial encounter for closed fracture: Secondary | ICD-10-CM

## 2013-09-22 HISTORY — DX: Essential (primary) hypertension: I10

## 2013-09-22 HISTORY — DX: Hyperlipidemia, unspecified: E78.5

## 2013-09-22 MED ORDER — SODIUM CHLORIDE 0.9 % IV BOLUS (SEPSIS): 1000.0000 mL | Freq: Once | INTRAVENOUS | Status: DC

## 2013-09-22 MED ORDER — KETOROLAC TROMETHAMINE 30 MG/ML IJ SOLN
30.0000 mg | Freq: Once | INTRAMUSCULAR | Status: AC
  Administered 2013-09-22: 30 mg via INTRAMUSCULAR
  Filled 2013-09-22: qty 1

## 2013-09-22 MED ORDER — OXYCODONE-ACETAMINOPHEN 5-325 MG PO TABS: 1.0000 | ORAL_TABLET | Freq: Four times a day (QID) | ORAL | Status: DC | PRN

## 2013-09-22 MED ORDER — OXYCODONE-ACETAMINOPHEN 5-325 MG PO TABS
1.0000 | ORAL_TABLET | Freq: Once | ORAL | Status: AC
  Administered 2013-09-22: 1 via ORAL
  Filled 2013-09-22: qty 1

## 2013-09-22 MED ORDER — HYDROCODONE-ACETAMINOPHEN 5-325 MG PO TABS
ORAL_TABLET | ORAL | Status: AC
  Filled 2013-09-22: qty 2

## 2013-09-22 MED ORDER — HYDROCODONE-ACETAMINOPHEN 5-325 MG PO TABS
2.0000 | ORAL_TABLET | Freq: Once | ORAL | Status: AC
  Administered 2013-09-22: 2 via ORAL

## 2013-09-22 NOTE — Discharge Instructions (Signed)
Rib Fracture A rib fracture is a break or crack in one of the bones of the ribs. The ribs are a group of long, curved bones that wrap around your chest and attach to your spine. They protect your lungs and other organs in the chest cavity. A broken or cracked rib is often painful, but most do not cause other problems. Most rib fractures heal on their own over time. However, rib fractures can be more serious if multiple ribs are broken or if broken ribs move out of place and push against other structures. CAUSES   A direct blow to the chest. For example, this could happen during contact sports, a car accident, or a fall against a hard object.  Repetitive movements with high force, such as pitching a baseball or having severe coughing spells. SYMPTOMS   Pain when you breathe in or cough.  Pain when someone presses on the injured area. DIAGNOSIS  Your caregiver will perform a physical exam. Various imaging tests may be ordered to confirm the diagnosis and to look for related injuries. These tests may include a chest X-ray, computed tomography (CT), magnetic resonance imaging (MRI), or a bone scan. TREATMENT  Rib fractures usually heal on their own in 1 3 months. The longer healing period is often associated with a continued cough or other aggravating activities. During the healing period, pain control is very important. Medication is usually given to control pain. Hospitalization or surgery may be needed for more severe injuries, such as those in which multiple ribs are broken or the ribs have moved out of place.  HOME CARE INSTRUCTIONS   Avoid strenuous activity and any activities or movements that cause pain. Be careful during activities and avoid bumping the injured rib.  Gradually increase activity as directed by your caregiver.  Only take over-the-counter or prescription medications as directed by your caregiver. Do not take other medications without asking your caregiver first.  Apply ice  to the injured area for the first 1 2 days after you have been treated or as directed by your caregiver. Applying ice helps to reduce inflammation and pain.  Put ice in a plastic bag.  Place a towel between your skin and the bag.   Leave the ice on for 15 20 minutes at a time, every 2 hours while you are awake.  Perform deep breathing as directed by your caregiver. This will help prevent pneumonia, which is a common complication of a broken rib. Your caregiver may instruct you to:  Take deep breaths several times a day.  Try to cough several times a day, holding a pillow against the injured area.  Use a device called an incentive spirometer to practice deep breathing several times a day.  Drink enough fluids to keep your urine clear or pale yellow. This will help you avoid constipation.   Do not wear a rib belt or binder. These restrict breathing, which can lead to pneumonia.  SEEK IMMEDIATE MEDICAL CARE IF:   You have a fever.   You have difficulty breathing or shortness of breath.   You develop a continual cough, or you cough up thick or bloody sputum.  You feel sick to your stomach (nausea), throw up (vomit), or have abdominal pain.   You have worsening pain not controlled with medications.  MAKE SURE YOU:  Understand these instructions.  Will watch your condition.  Will get help right away if you are not doing well or get worse. Document Released: 06/10/2005 Document Revised:  02/10/2013 Document Reviewed: 08/12/2012 Exit[employer] Patient Information 2014 [place], [place].

## 2013-09-22 NOTE — ED Provider Notes (Addendum)
CSN: [id number]     Arrival date & time 09/22/13  1338 History   First MD Initiated Contact with Patient 09/22/13 1501     Chief Complaint  Patient presents with  . Fall     (Consider location/radiation/quality/duration/timing/severity/associated sxs/prior Treatment) Patient is a 80 y.o. female presenting with fall. The history is provided by the patient.  Fall This is a new (was doing yard work and fell in the driveway on her left side.  thinks she tripped on something) problem. The current episode started 1 to 2 hours ago. The problem occurs constantly. The problem has not changed since onset.Associated symptoms include chest pain. Pertinent negatives include no abdominal pain and no shortness of breath. Associated symptoms comments: No head injury or LOC.  NO pain in the legs or arms.  . Exacerbated by: deep breathing, coughing and moving her left arm. The symptoms are relieved by rest. She has tried nothing for the symptoms. The treatment provided no relief.    Past Medical History  Diagnosis Date  . Hypertension   . Hyperlipidemia    No past surgical history on file. No family history on file. History  Substance Use Topics  . Smoking status: Not on file  . Smokeless tobacco: Not on file  . Alcohol Use: Not on file   OB History   Grav Para Term Preterm Abortions TAB SAB Ect Mult Living                 Review of Systems  Respiratory: Negative for shortness of breath.   Cardiovascular: Positive for chest pain.  Gastrointestinal: Negative for abdominal pain.  All other systems reviewed and are negative.      Allergies  Review of patient's allergies indicates no known allergies.  Home Medications  No current outpatient prescriptions on file. BP 149/53  Pulse 52  Temp(Src) 97.6 F (36.4 C) (Oral)  Resp 18  Ht 5\' 5"  (1.651 m)  Wt 165 lb (74.844 kg)  BMI 27.46 kg/m2  SpO2 96% Physical Exam  Nursing note and vitals reviewed. Constitutional: She is oriented to  person, place, and time. She appears well-developed and well-nourished. No distress.  HENT:  Head: Normocephalic and atraumatic.  Mouth/Throat: Oropharynx is clear and moist.  Eyes: Conjunctivae and EOM are normal. Pupils are equal, round, and reactive to light.  Neck: Normal range of motion. Neck supple. No spinous process tenderness and no muscular tenderness present.  Cardiovascular: Normal rate, regular rhythm and intact distal pulses.   No murmur heard. Pulmonary/Chest: Effort normal and breath sounds normal. No respiratory distress. She has no wheezes. She has no rales.   She exhibits tenderness. She exhibits no crepitus, no deformity and no retraction.    Abdominal: Soft. She exhibits no distension. There is no tenderness. There is no rebound and no guarding.  Musculoskeletal: Normal range of motion. She exhibits no edema and no tenderness.       Thoracic back: Normal.       Lumbar back: Normal.  Neurological: She is alert and oriented to person, place, and time.  Skin: Skin is warm and dry. No rash noted. No erythema.  Psychiatric: She has a normal mood and affect. Her behavior is normal.    ED Course  Procedures (including critical care time) Labs Review Labs Reviewed  URINALYSIS, ROUTINE W REFLEX MICROSCOPIC   Imaging Review Dg Ribs Unilateral W/chest Left  09/22/2013   CLINICAL DATA:  Fall, left-sided rib pain  EXAM: LEFT RIBS AND CHEST -  3+ VIEW  COMPARISON:  Prior radiograph from 04/02/2009  FINDINGS: Cardiac and mediastinal silhouettes are stable in size and contour, and remain within normal limits.  Lungs are normally inflated. Mild left basilar atelectasis is present with probable small left pleural effusion. No focal infiltrate. No pulmonary edema. There is no pneumothorax.  There are acute minimally displaced fractures of the left posterior seventh, eighth, ninth, and tenth ribs.  IMPRESSION: 1. Acute minimally displaced fractures of the left posterior seventh through  tenth ribs. No pneumothorax. 2. Small left pleural effusion with mild left basilar atelectasis.   Electronically Signed   By: Jeannine Boga M.D.   On: 09/22/2013 15:20     EKG Interpretation None      MDM   Final diagnoses:  Left rib fracture    Patient with a mechanical fall in the driveway today on her left ribs with pain in her (and no shortness of breath. She denies pain anywhere else. No LOC or head injury. Patient takes no anticoagulation was able to ambulate without difficulty. Chest x-ray consistent with left-sided rib fractures. Patient given hydrocodone and we'll see if her pain is controlled.  Only minimal improvement of pain so pt given percocet and toradol injection.  Pt sats 100% and given strict return precautions.  Blanchie Dessert, MD 09/22/13 1625  Blanchie Dessert, MD 09/22/13 [phone]

## 2013-09-22 NOTE — ED Notes (Signed)
Pt sts she fell in driveway while working outside. Remembers event, is unsure what made her fall. Pain in left posterior flank area. Scratch to left wrist. Denies hitting her head. Denies anticoagulant medications.

## 2013-09-22 NOTE — ED Notes (Signed)
Report received from Harland Dingwall, RN care assumed.

## 2014-05-10 ENCOUNTER — Telehealth: Payer: Self-pay | Admitting: Internal Medicine

## 2014-05-10 NOTE — Telephone Encounter (Signed)
Records received from [facility] (Dr Thea Silversmith) for appointment with Dr Debara Pickett on 05/31/14.  Records given to [facility] (medical records) for Dr Rod Mae schedule on 05/31/14  lp

## 2014-05-31 ENCOUNTER — Ambulatory Visit (INDEPENDENT_AMBULATORY_CARE_PROVIDER_SITE_OTHER): Payer: Medicare Other | Admitting: Internal Medicine

## 2014-05-31 ENCOUNTER — Encounter: Payer: Self-pay | Admitting: Internal Medicine

## 2014-05-31 VITALS — BP 150/80 | HR 64 | Ht 65.0 in | Wt 169.5 lb

## 2014-05-31 DIAGNOSIS — Z7289 Other problems related to lifestyle: Secondary | ICD-10-CM

## 2014-05-31 DIAGNOSIS — E785 Hyperlipidemia, unspecified: Secondary | ICD-10-CM

## 2014-05-31 DIAGNOSIS — E782 Mixed hyperlipidemia: Secondary | ICD-10-CM | POA: Insufficient documentation

## 2014-05-31 DIAGNOSIS — R011 Cardiac murmur, unspecified: Secondary | ICD-10-CM

## 2014-05-31 DIAGNOSIS — I10 Essential (primary) hypertension: Secondary | ICD-10-CM | POA: Insufficient documentation

## 2014-05-31 DIAGNOSIS — Z9889 Other specified postprocedural states: Secondary | ICD-10-CM

## 2014-05-31 DIAGNOSIS — Z8679 Personal history of other diseases of the circulatory system: Secondary | ICD-10-CM | POA: Insufficient documentation

## 2014-05-31 DIAGNOSIS — I739 Peripheral vascular disease, unspecified: Secondary | ICD-10-CM

## 2014-05-31 DIAGNOSIS — Z01810 Encounter for preprocedural cardiovascular examination: Secondary | ICD-10-CM

## 2014-05-31 DIAGNOSIS — Z789 Other specified health status: Secondary | ICD-10-CM

## 2014-05-31 DIAGNOSIS — R001 Bradycardia, unspecified: Secondary | ICD-10-CM

## 2014-05-31 MED ORDER — AMLODIPINE BESYLATE 10 MG PO TABS
10.0000 mg | ORAL_TABLET | Freq: Every day | ORAL | Status: DC
Start: 2014-05-31 — End: 2015-04-13

## 2014-05-31 MED ORDER — AMLODIPINE BESYLATE 10 MG PO TABS: 10.0000 mg | ORAL_TABLET | Freq: Every day | ORAL | Status: DC

## 2014-05-31 NOTE — Patient Instructions (Signed)
Your physician has requested that you have an echocardiogram. Echocardiography is a painless test that uses sound waves to create images of your heart. It provides your doctor with information about the size and shape of your heart and how well your heart's chambers and valves are working. This procedure takes approximately one hour. There are no restrictions for this procedure.  Your physician has requested that you have a lexiscan myoview. For further information please visit [email or web address]. Please follow instruction sheet, as given.  Your physician has recommended you make the following change in your medication...  1. DECREASE metoprolol tartrate to 25mg  twice daily 2. INCREASE amlodipine to 10mg  daily   Your physician recommends that you schedule a follow-up appointment after your tests.

## 2014-05-31 NOTE — Progress Notes (Signed)
OFFICE NOTE  Chief Complaint:  Bradycardia, preoperative evaluation  Primary Care Physician: Thurman Coyer, MD  HPI:  Mary Merritt is an 80 year old female  Kindly referred to me by Dr. Thea Silversmith. She is recently been having hip problems and is in need of hip replacement. Her past medical history significant for hypertension which is been difficult to control, dyslipidemia and  AAA status post repair by Dr. Scot Dock in 2009.  Recently during preoperative evaluation she was found to be bradycardic with a heart rate in the 40s. It is notable that she is on a beta blocker. She's also had difficult to control hypertension. She has not had follow-up with the vascular surgeons for her abdominal aortic aneurysm. She reportedly had stress testing prior to vascular surgery. She is not complaining of any angina but does get some shortness of breath. She is not particularly active due to her hip problem.   PMHx:  Past Medical History  Diagnosis Date  . Hypertension   . Hyperlipidemia     Past Surgical History  Procedure Laterality Date  . Abdominal aortic aneurysm repair      2009, Dr. Scot Dock    FAMHx:  Family History  Problem Relation Age of Onset  . Heart failure Father   . Stroke Mother     SOCHx:   reports that she quit smoking about 30 years ago. She has never used smokeless tobacco. She reports that she does not drink alcohol or use illicit drugs.  ALLERGIES:  No Known Allergies  ROS: A comprehensive review of systems was negative except for: Respiratory: positive for dyspnea on exertion Cardiovascular: positive for bradycardia Musculoskeletal: positive for stiff joints  HOME MEDS: Current Outpatient Prescriptions  Medication Sig Dispense Refill  . amLODipine (NORVASC) 10 MG tablet Take 1 tablet (10 mg total) by mouth daily. 90 tablet 0  . aspirin 81 MG tablet Take 81 mg by mouth daily.    Marland Kitchen azithromycin (ZITHROMAX) 250 MG tablet Take 2 tablets on day 1, then 1 tablet  daily for 4 days.    . fexofenadine (ALLEGRA) 30 MG tablet Take 30 mg by mouth daily.    Marland Kitchen guaiFENesin-codeine (ROBITUSSIN AC) 100-10 MG/5ML syrup Take 5 mLs by mouth as needed.    . hydrochlorothiazide (HYDRODIURIL) 25 MG tablet Take 25 mg by mouth daily.    Marland Kitchen lovastatin (MEVACOR) 40 MG tablet Take 20 mg by mouth daily.    . metoprolol (LOPRESSOR) 50 MG tablet Take 25 mg by mouth 2 (two) times daily.      No current facility-administered medications for this visit.    LABS/IMAGING: No results found for this or any previous visit (from the past 48 hour(s)). No results found.  VITALS: BP 150/80 mmHg  Pulse 64  Ht 5\' 5"  (1.651 m)  Wt 169 lb 8 oz (76.885 kg)  BMI 28.21 kg/m2  EXAM: General appearance: alert and somewhat flat affect Neck: no carotid bruit and no JVD Lungs: clear to auscultation bilaterally Heart: regular rate and rhythm, S1, S2 normal and systolic murmur: early systolic 3/6, crescendo at 2nd left intercostal space Abdomen: soft, non-tender; bowel sounds normal; no masses,  no organomegaly Extremities: extremities normal, atraumatic, no cyanosis or edema Pulses: 2+ and symmetric Skin: Skin color, texture, turgor normal. No rashes or lesions Neurologic: Grossly normal Psych: Depressed mood, flat affect  EKG:  sinus bradycardia with first-degree AV block, PACs at 64  ASSESSMENT: 1.  indeterminate perioperative cardiovascular risk 2.  bradycardia 3.  dyspnea  on exertion 4.  history of AAA status post repair in 2009 5.  hypertension-poorly controlled 6.  dyslipidemia 7.  murmur  PLAN: 1.    Mrs. Vancleve has a number of cardiovascular risk factors and has not had significant evaluation since prior to her abdominal aortic aneurysm repair in 2009. She is presented recently with pericardia and does have a first-degree AV block and PACs with some mild EKG abnormalities. This could be partially related to her beta blocker. I recommend decreasing the dose in half  To 25  mg twice daily. Because her blood pressure has not been well controlled, we could increase her Norvasc to 10 mg daily. I would recommend a Lexiscan nuclear stress test to further evaluate for ischemia as she is not able to walk and does have multiple cardiac risk factors. She also has a murmur on exam today which needs further evaluation. I would recommend an echocardiogram as an not has not been previously reported.  I will plan to see her back to go over her stress test and echocardiogram in the next few weeks. She ultimately may need to be referred back to vascular surgery or followed by [place] with abdominal ultrasounds or other imaging studies to assure that her abdominal aortic aneurysm is stable.   Thanks again for the kind referral.  Pixie Casino, MD, [facility] Attending Cardiologist [facility]  Kaspar Albornoz C 05/31/2014, 5:14 PM

## 2014-06-02 ENCOUNTER — Telehealth (HOSPITAL_COMMUNITY): Payer: Self-pay

## 2014-06-02 NOTE — Telephone Encounter (Signed)
Encounter complete. 

## 2014-06-07 ENCOUNTER — Encounter (HOSPITAL_COMMUNITY): Payer: Medicare Other

## 2014-06-07 ENCOUNTER — Ambulatory Visit (HOSPITAL_COMMUNITY): Payer: Medicare Other

## 2014-06-14 ENCOUNTER — Telehealth (HOSPITAL_COMMUNITY): Payer: Self-pay

## 2014-06-14 NOTE — Telephone Encounter (Signed)
Encounter complete. 

## 2014-06-15 ENCOUNTER — Telehealth (HOSPITAL_COMMUNITY): Payer: Self-pay

## 2014-06-15 NOTE — Telephone Encounter (Signed)
Encounter complete. 

## 2014-06-21 ENCOUNTER — Ambulatory Visit (HOSPITAL_BASED_OUTPATIENT_CLINIC_OR_DEPARTMENT_OTHER)
Admission: RE | Admit: 2014-06-21 | Discharge: 2014-06-21 | Disposition: A | Discharge status: Home / Self Care | Payer: Medicare Other | Source: Ambulatory Visit | Attending: Internal Medicine | Admitting: Internal Medicine

## 2014-06-21 ENCOUNTER — Ambulatory Visit (HOSPITAL_COMMUNITY)
Admission: RE | Admit: 2014-06-21 | Discharge: 2014-06-21 | Disposition: A | Discharge status: Home / Self Care | Payer: Medicare Other | Source: Ambulatory Visit | Attending: Cardiology | Admitting: Cardiology

## 2014-06-21 DIAGNOSIS — Z87891 Personal history of nicotine dependence: Secondary | ICD-10-CM | POA: Insufficient documentation

## 2014-06-21 DIAGNOSIS — I739 Peripheral vascular disease, unspecified: Secondary | ICD-10-CM | POA: Diagnosis not present

## 2014-06-21 DIAGNOSIS — E785 Hyperlipidemia, unspecified: Secondary | ICD-10-CM | POA: Diagnosis not present

## 2014-06-21 DIAGNOSIS — I10 Essential (primary) hypertension: Secondary | ICD-10-CM

## 2014-06-21 DIAGNOSIS — E663 Overweight: Secondary | ICD-10-CM | POA: Insufficient documentation

## 2014-06-21 DIAGNOSIS — R0609 Other forms of dyspnea: Secondary | ICD-10-CM | POA: Insufficient documentation

## 2014-06-21 DIAGNOSIS — Z8249 Family history of ischemic heart disease and other diseases of the circulatory system: Secondary | ICD-10-CM | POA: Diagnosis not present

## 2014-06-21 DIAGNOSIS — Z01810 Encounter for preprocedural cardiovascular examination: Secondary | ICD-10-CM

## 2014-06-21 DIAGNOSIS — R9431 Abnormal electrocardiogram [ECG] [EKG]: Secondary | ICD-10-CM | POA: Insufficient documentation

## 2014-06-21 DIAGNOSIS — Z789 Other specified health status: Secondary | ICD-10-CM

## 2014-06-21 DIAGNOSIS — R011 Cardiac murmur, unspecified: Secondary | ICD-10-CM

## 2014-06-21 DIAGNOSIS — I498 Other specified cardiac arrhythmias: Secondary | ICD-10-CM

## 2014-06-21 DIAGNOSIS — I519 Heart disease, unspecified: Secondary | ICD-10-CM

## 2014-06-21 MED ORDER — REGADENOSON 0.4 MG/5ML IV SOLN
0.4000 mg | Freq: Once | INTRAVENOUS | Status: AC
  Administered 2014-06-21: 0.4 mg via INTRAVENOUS

## 2014-06-21 MED ORDER — TECHNETIUM TC 99M SESTAMIBI GENERIC - CARDIOLITE
31.2000 | Freq: Once | INTRAVENOUS | Status: AC | PRN
  Administered 2014-06-21: 31.2 via INTRAVENOUS

## 2014-06-21 MED ORDER — TECHNETIUM TC 99M SESTAMIBI GENERIC - CARDIOLITE
10.9000 | Freq: Once | INTRAVENOUS | Status: AC | PRN
  Administered 2014-06-21: 10.9 via INTRAVENOUS

## 2014-06-21 MED ORDER — AMINOPHYLLINE 25 MG/ML IV SOLN
75.0000 mg | Freq: Once | INTRAVENOUS | Status: AC
  Administered 2014-06-21: 75 mg via INTRAVENOUS

## 2014-06-21 NOTE — Progress Notes (Signed)
2D Echocardiogram Complete.  06/21/2014   Mary Merritt, [place]

## 2014-06-21 NOTE — Procedures (Addendum)
[facility] [address] [place] [address] [phone]  Cardiology Nuclear Med Study  Mary Merritt is a 80 y.o. female     MRN : [id number]     DOB: [birth year]  Procedure Date: 06/21/2014  Nuclear Med Background Indication for Stress Test:  Evaluation for Ischemia, Surgical Clearance and Abnormal EKG History:  Cardiac Murmur;No prior respiratory history reported;Pt states she has had two prior NUC MPI's but none in EPIC for comparison. Cardiac Risk Factors: Family History - CAD, History of Smoking, Hypertension, Lipids, Overweight and PVD  Symptoms:  DOE   Nuclear Pre-Procedure Caffeine/Decaff Intake:  7:00pm NPO After: 5:00am   IV Site: R Forearm  IV 0.9% NS with Angio Cath:  22g  Chest Size (in):  n/a IV Started by: Rolene Course, RN  Height: 5\' 5"  (1.651 m)  Cup Size: C  BMI:  Body mass index is 28.12 kg/(m^2). Weight:  169 lb (76.658 kg)   Tech Comments:  n/a    Nuclear Med Study 1 or 2 day study: 1 day  Stress Test Type:  [place] Provider:  Lyman Bishop, MD   Resting Radionuclide: Technetium 66m Sestamibi  Resting Radionuclide Dose: 10.9 mCi   Stress Radionuclide:  Technetium 37m Sestamibi  Stress Radionuclide Dose: 31.2 mCi           Stress Protocol Rest HR: 48 Stress HR: 67  Rest BP: 128/71 Stress BP: 153/63  Exercise Time (min): n/a METS: n/a          Dose of Adenosine (mg):  n/a Dose of Lexiscan: 0.4 mg  Dose of Atropine (mg): n/a Dose of Dobutamine: n/a mcg/kg/min (at max HR)  Stress Test Technologist: Mellody Memos, CCT Nuclear Technologist: Imagene Riches, CNMT   Rest Procedure:  Myocardial perfusion imaging was performed at rest 45 minutes following the intravenous administration of Technetium 20m Sestamibi. Stress Procedure:  The patient received IV Lexiscan 0.4 mg over 15-seconds.  Technetium 7m Sestamibi injected at 30-seconds.  Patient experienced shortness of breath,  throatfullness and was administered 75 mg of Aminophylline IV. There were no significant changes with Lexiscan.  Quantitative spect images were obtained after a 45 minute delay.  Transient Ischemic Dilatation (Normal <1.22):  1.03  QGS EDV:  74 ml QGS ESV:  23 ml LV Ejection Fraction: 69%        Rest ECG: NSR - Normal EKG  Stress ECG: No significant change from baseline ECG  QPS Raw Data Images:  Normal; no motion artifact; normal heart/lung ratio. Stress Images:  Normal homogeneous uptake in all areas of the myocardium. Rest Images:  Normal homogeneous uptake in all areas of the myocardium. Subtraction (SDS):  No evidence of ischemia.  Impression Exercise Capacity:  Lexiscan with no exercise. BP Response:  Normal blood pressure response. Clinical Symptoms:  No significant symptoms noted. ECG Impression:  No significant ST segment change suggestive of ischemia. Comparison with Prior Nuclear Study: No images to compare  Overall Impression:  Normal stress nuclear study.  LV Wall Motion:  NL LV Function; NL Wall Motion   Lorretta Harp, MD  06/21/2014 1:26 PM

## 2014-06-29 ENCOUNTER — Encounter: Payer: Self-pay | Admitting: Internal Medicine

## 2014-06-29 ENCOUNTER — Ambulatory Visit (INDEPENDENT_AMBULATORY_CARE_PROVIDER_SITE_OTHER): Payer: Medicare Other | Admitting: Internal Medicine

## 2014-06-29 VITALS — BP 146/64 | HR 64 | Ht 65.0 in | Wt 167.0 lb

## 2014-06-29 DIAGNOSIS — E785 Hyperlipidemia, unspecified: Secondary | ICD-10-CM

## 2014-06-29 DIAGNOSIS — R011 Cardiac murmur, unspecified: Secondary | ICD-10-CM

## 2014-06-29 DIAGNOSIS — Z01810 Encounter for preprocedural cardiovascular examination: Secondary | ICD-10-CM

## 2014-06-29 DIAGNOSIS — I10 Essential (primary) hypertension: Secondary | ICD-10-CM

## 2014-06-29 DIAGNOSIS — R001 Bradycardia, unspecified: Secondary | ICD-10-CM

## 2014-06-29 NOTE — Progress Notes (Signed)
OFFICE NOTE  Chief Complaint:  Bradycardia, preoperative evaluation  Primary Care Physician: Thurman Coyer, MD  HPI:  Mary Merritt is an 81 year old female  Kindly referred to me by Dr. Thea Silversmith. She is recently been having hip problems and is in need of hip replacement. Her past medical history significant for hypertension which is been difficult to control, dyslipidemia and  AAA status post repair by Dr. Scot Dock in 2009.  Recently during preoperative evaluation she was found to be bradycardic with a heart rate in the 40s. It is notable that she is on a beta blocker. She's also had difficult to control hypertension. She has not had follow-up with the vascular surgeons for her abdominal aortic aneurysm. She reportedly had stress testing prior to vascular surgery. She is not complaining of any angina but does get some shortness of breath. She is not particularly active due to her hip problem.   Mary Merritt returns today for follow-up. She underwent a nuclear stress test and echocardiogram which showed no significant abnormalities. I've adjusted her medications and reduce her beta blocker, but heart rate still remains in the 60s. She reports a family history of low heart rate. She will need to continue to monitor for any progressive bradycardia.  PMHx:  Past Medical History  Diagnosis Date  . Hypertension   . Hyperlipidemia     Past Surgical History  Procedure Laterality Date  . Abdominal aortic aneurysm repair      2009, Dr. Scot Dock    FAMHx:  Family History  Problem Relation Age of Onset  . Heart failure Father   . Stroke Mother     SOCHx:   reports that she quit smoking about 30 years ago. She has never used smokeless tobacco. She reports that she does not drink alcohol or use illicit drugs.  ALLERGIES:  No Known Allergies  ROS: A comprehensive review of systems was negative except for: Respiratory: positive for dyspnea on exertion Cardiovascular: positive for  bradycardia Musculoskeletal: positive for stiff joints  HOME MEDS: Current Outpatient Prescriptions  Medication Sig Dispense Refill  . amLODipine (NORVASC) 10 MG tablet Take 1 tablet (10 mg total) by mouth daily. 90 tablet 0  . aspirin 81 MG tablet Take 81 mg by mouth daily.    . fexofenadine (ALLEGRA) 30 MG tablet Take 30 mg by mouth daily.    . hydrochlorothiazide (HYDRODIURIL) 25 MG tablet Take 25 mg by mouth daily.    Marland Kitchen lovastatin (MEVACOR) 40 MG tablet Take 20 mg by mouth daily.    . metoprolol (LOPRESSOR) 50 MG tablet Take 25 mg by mouth 2 (two) times daily.      No current facility-administered medications for this visit.    LABS/IMAGING: No results found for this or any previous visit (from the past 48 hour(s)). No results found.  VITALS: BP 146/64 mmHg  Pulse 64  Ht 5\' 5"  (1.651 m)  Wt 167 lb (75.751 kg)  BMI 27.79 kg/m2  EXAM: deferred  EKG: deferred  ASSESSMENT: 1.  Low perioperative cardiovascular risk 2.  bradycardia 3.  dyspnea on exertion 4.  history of AAA status post repair in 2009 5.  hypertension-poorly controlled 6.  dyslipidemia 7.  murmur  PLAN: 1.    Mary Merritt is low risk for upcoming hip surgery. Her stress test was negative. I will continue her perioperative beta blocker. Plan to see her back annually or sooner as necessary.  Pixie Casino, MD, [facility] Attending Cardiologist [facility]  Derrika Ruffalo C  06/29/2014, 2:29 PM

## 2014-06-29 NOTE — Patient Instructions (Signed)
Your physician wants you to follow-up in: 1 year with Dr. Hilty. You will receive a reminder letter in the mail two months in advance. If you don't receive a letter, please call our office to schedule the follow-up appointment.  

## 2014-07-18 ENCOUNTER — Other Ambulatory Visit (HOSPITAL_COMMUNITY): Payer: Self-pay | Admitting: Orthopaedic Surgery

## 2014-07-19 ENCOUNTER — Encounter (HOSPITAL_COMMUNITY): Payer: Self-pay | Admitting: Pharmacy Technician

## 2014-07-21 ENCOUNTER — Encounter (HOSPITAL_COMMUNITY): Payer: Self-pay

## 2014-07-21 ENCOUNTER — Encounter (HOSPITAL_COMMUNITY)
Admission: RE | Admit: 2014-07-21 | Discharge: 2014-07-21 | Disposition: A | Discharge status: Home / Self Care | Payer: Medicare Other | Source: Ambulatory Visit | Attending: Orthopaedic Surgery | Admitting: Orthopaedic Surgery

## 2014-07-21 HISTORY — DX: Anxiety disorder, unspecified: F41.9

## 2014-07-21 HISTORY — DX: Cardiac arrhythmia, unspecified: I49.9

## 2014-07-21 HISTORY — DX: Unspecified osteoarthritis, unspecified site: M19.90

## 2014-07-21 LAB — URINALYSIS, ROUTINE W REFLEX MICROSCOPIC
Bilirubin Urine: NEGATIVE
Glucose, UA: NEGATIVE mg/dL
Hgb urine dipstick: NEGATIVE
Ketones, ur: NEGATIVE mg/dL
LEUKOCYTES UA: NEGATIVE
Nitrite: NEGATIVE
PROTEIN: NEGATIVE mg/dL
SPECIFIC GRAVITY, URINE: 1.017 (ref 1.005–1.030)
Urobilinogen, UA: 0.2 mg/dL (ref 0.0–1.0)
pH: 6.5 (ref 5.0–8.0)

## 2014-07-21 LAB — TYPE AND SCREEN
ABO/RH(D): O POS
ANTIBODY SCREEN: NEGATIVE

## 2014-07-21 LAB — SURGICAL PCR SCREEN
MRSA, PCR: NEGATIVE
STAPHYLOCOCCUS AUREUS: NEGATIVE

## 2014-07-21 LAB — CBC WITH DIFFERENTIAL/PLATELET
Basophils Absolute: 0.1 10*3/uL (ref 0.0–0.1)
Basophils Relative: 1 % (ref 0–1)
EOS PCT: 6 % — AB (ref 0–5)
Eosinophils Absolute: 0.5 10*3/uL (ref 0.0–0.7)
HCT: 42.8 % (ref 36.0–46.0)
HEMOGLOBIN: 14.3 g/dL (ref 12.0–15.0)
LYMPHS PCT: 26 % (ref 12–46)
Lymphs Abs: 2.1 10*3/uL (ref 0.7–4.0)
MCH: 29.9 pg (ref 26.0–34.0)
MCHC: 33.4 g/dL (ref 30.0–36.0)
MCV: 89.5 fL (ref 78.0–100.0)
MONOS PCT: 7 % (ref 3–12)
Monocytes Absolute: 0.6 10*3/uL (ref 0.1–1.0)
Neutro Abs: 5 10*3/uL (ref 1.7–7.7)
Neutrophils Relative %: 60 % (ref 43–77)
PLATELETS: 265 10*3/uL (ref 150–400)
RBC: 4.78 MIL/uL (ref 3.87–5.11)
RDW: 13.2 % (ref 11.5–15.5)
WBC: 8.3 10*3/uL (ref 4.0–10.5)

## 2014-07-21 LAB — PROTIME-INR
INR: 0.99 (ref 0.00–1.49)
PROTHROMBIN TIME: 13.2 s (ref 11.6–15.2)

## 2014-07-21 LAB — COMPREHENSIVE METABOLIC PANEL
ALT: 11 U/L (ref 0–35)
AST: 23 U/L (ref 0–37)
Albumin: 4 g/dL (ref 3.5–5.2)
Alkaline Phosphatase: 64 U/L (ref 39–117)
Anion gap: 10 (ref 5–15)
BILIRUBIN TOTAL: 0.6 mg/dL (ref 0.3–1.2)
BUN: 15 mg/dL (ref 6–23)
CO2: 28 mmol/L (ref 19–32)
Calcium: 9.3 mg/dL (ref 8.4–10.5)
Chloride: 102 mmol/L (ref 96–112)
Creatinine, Ser: 0.91 mg/dL (ref 0.50–1.10)
GFR, EST AFRICAN AMERICAN: 65 mL/min — AB (ref >90)
GFR, EST NON AFRICAN AMERICAN: 56 mL/min — AB (ref >90)
Glucose, Bld: 83 mg/dL (ref 70–99)
POTASSIUM: 3.6 mmol/L (ref 3.5–5.1)
SODIUM: 140 mmol/L (ref 135–145)
Total Protein: 7 g/dL (ref 6.0–8.3)

## 2014-07-21 LAB — APTT: aPTT: 32 seconds (ref 24–37)

## 2014-07-21 NOTE — Pre-Procedure Instructions (Signed)
Mary Merritt  07/21/2014   Your procedure is scheduled on:  02-082016  Monday   Report to [facility] Admitting at 10:30 AM.   Call this number if you have problems the morning of surgery: [phone]   Remember:   Do not eat food or drink liquids after midnight.    Take these medicines the morning of surgery with A SIP OF WATER: amlodipine(Norvasc),fexofenadine(allegra),Metoprolol(Lopressor)   Do not wear jewelry, make-up or nail polish.  Do not wear lotions, powders, or perfumes. You may not wear deodorant.    Do not shave 48 hours prior to surgery.   Do not bring valuables to the hospital.  [facility] is not responsible for any belongings or valuables.               Contacts, dentures or bridgework may not be worn into surgery.   Leave suitcase in the car. After surgery it may be brought to your room.   For patients admitted to the hospital, discharge time is determined by your treatment team.               .    Special Instructions: See attached sheet for instuctions on [facility] shower/bath     Please read over the following fact sheets that you were given: Pain Booklet, Coughing and Deep Breathing, Blood Transfusion Information and Surgical Site Infection Prevention

## 2014-07-31 MED ORDER — BUPIVACAINE LIPOSOME 1.3 % IJ SUSP
20.0000 mL | Freq: Once | INTRAMUSCULAR | Status: DC
  Filled 2014-07-31: qty 20

## 2014-07-31 MED ORDER — TRANEXAMIC ACID 100 MG/ML IV SOLN
1000.0000 mg | INTRAVENOUS | Status: AC
  Administered 2014-08-01: 1000 mg via INTRAVENOUS
  Filled 2014-07-31: qty 10

## 2014-07-31 MED ORDER — CEFAZOLIN SODIUM-DEXTROSE 2-3 GM-% IV SOLR
2.0000 g | INTRAVENOUS | Status: AC
  Administered 2014-08-01: 2 g via INTRAVENOUS

## 2014-07-31 NOTE — H&P (Signed)
PREOPERATIVE H&P  Chief Complaint: right hip osteoarthritis  HPI: Mary Merritt is a 82 y.o. female who presents for surgical treatment of right hip osteoarthritis.  She denies any changes in medical history.  Past Medical History  Diagnosis Date  . Hypertension   . Hyperlipidemia   . Dysrhythmia     bradycardia  . Anxiety   . Arthritis    Past Surgical History  Procedure Laterality Date  . Abdominal aortic aneurysm repair      2009, Dr. Scot Dock   History   Social History  . Marital Status: Widowed    Spouse Name: N/A    Number of Children: N/A  . Years of Education: N/A   Social History Main Topics  . Smoking status: Former Smoker -- 30 years    Quit date: 05/31/1984  . Smokeless tobacco: Never Used  . Alcohol Use: No  . Drug Use: No  . Sexual Activity: Not on file   Other Topics Concern  . Not on file   Social History Narrative   Family History  Problem Relation Age of Onset  . Heart failure Father   . Stroke Mother    Allergies  Allergen Reactions  . Percocet [Oxycodone-Acetaminophen] Other (See Comments)    Makes head feel funny   Prior to Admission medications   Medication Sig Start Date End Date Taking? Authorizing Provider  amLODipine (NORVASC) 10 MG tablet Take 1 tablet (10 mg total) by mouth daily. 05/31/14  Yes Pixie Casino, MD  aspirin 81 MG tablet Take 81 mg by mouth daily.   Yes Historical Provider, MD  fexofenadine (ALLEGRA) 30 MG tablet Take 30 mg by mouth daily.   Yes Historical Provider, MD  hydrochlorothiazide (HYDRODIURIL) 25 MG tablet Take 25 mg by mouth daily. 03/15/14  Yes Historical Provider, MD  lovastatin (MEVACOR) 20 MG tablet Take 20 mg by mouth daily.   Yes Historical Provider, MD  metoprolol tartrate (LOPRESSOR) 25 MG tablet Take 25 mg by mouth 2 (two) times daily.   Yes Historical Provider, MD  Omega-3 Fatty Acids (FISH OIL) 1000 MG CAPS Take 1,000 mg by mouth 2 (two) times daily.   Yes Historical Provider, MD     Positive  ROS: All other systems have been reviewed and were otherwise negative with the exception of those mentioned in the HPI and as above.  Physical Exam: General: Alert, no acute distress Cardiovascular: No pedal edema Respiratory: No cyanosis, no use of accessory musculature GI: abdomen soft Skin: No lesions in the area of chief complaint Neurologic: Sensation intact distally Psychiatric: Patient is competent for consent with normal mood and affect Lymphatic: no lymphedema  MUSCULOSKELETAL: exam stable  Assessment: right hip osteoarthritis  Plan: Plan for Procedure(s): RIGHT TOTAL HIP ARTHROPLASTY ANTERIOR APPROACH  The risks benefits and alternatives were discussed with the patient including but not limited to the risks of nonoperative treatment, versus surgical intervention including infection, bleeding, nerve injury,  blood clots, cardiopulmonary complications, morbidity, mortality, among others, and they were willing to proceed.   Marianna Payment, MD   07/31/2014 8:29 PM

## 2014-08-01 ENCOUNTER — Inpatient Hospital Stay (HOSPITAL_COMMUNITY): Payer: Medicare Other | Admitting: Anesthesiology

## 2014-08-01 ENCOUNTER — Inpatient Hospital Stay (HOSPITAL_COMMUNITY): Payer: Medicare Other

## 2014-08-01 ENCOUNTER — Inpatient Hospital Stay (HOSPITAL_COMMUNITY)
Admission: RE | Admit: 2014-08-01 | Discharge: 2014-08-03 | DRG: 470 | Disposition: A | Discharge status: Skilled Nursing Facility | Payer: Medicare Other | Source: Ambulatory Visit | Attending: Orthopaedic Surgery | Admitting: Orthopaedic Surgery

## 2014-08-01 ENCOUNTER — Encounter (HOSPITAL_COMMUNITY): Payer: Self-pay | Admitting: *Deleted

## 2014-08-01 ENCOUNTER — Encounter (HOSPITAL_COMMUNITY)
Admission: RE | Disposition: A | Discharge status: Skilled Nursing Facility | Payer: Self-pay | Source: Ambulatory Visit | Attending: Orthopaedic Surgery

## 2014-08-01 DIAGNOSIS — Z7982 Long term (current) use of aspirin: Secondary | ICD-10-CM | POA: Diagnosis not present

## 2014-08-01 DIAGNOSIS — Z885 Allergy status to narcotic agent status: Secondary | ICD-10-CM | POA: Diagnosis not present

## 2014-08-01 DIAGNOSIS — Z823 Family history of stroke: Secondary | ICD-10-CM | POA: Diagnosis not present

## 2014-08-01 DIAGNOSIS — M1990 Unspecified osteoarthritis, unspecified site: Secondary | ICD-10-CM

## 2014-08-01 DIAGNOSIS — D62 Acute posthemorrhagic anemia: Secondary | ICD-10-CM | POA: Diagnosis not present

## 2014-08-01 DIAGNOSIS — M1611 Unilateral primary osteoarthritis, right hip: Secondary | ICD-10-CM | POA: Diagnosis present

## 2014-08-01 DIAGNOSIS — Z87891 Personal history of nicotine dependence: Secondary | ICD-10-CM

## 2014-08-01 DIAGNOSIS — Z79899 Other long term (current) drug therapy: Secondary | ICD-10-CM | POA: Diagnosis not present

## 2014-08-01 DIAGNOSIS — F419 Anxiety disorder, unspecified: Secondary | ICD-10-CM | POA: Diagnosis present

## 2014-08-01 DIAGNOSIS — E785 Hyperlipidemia, unspecified: Secondary | ICD-10-CM | POA: Diagnosis present

## 2014-08-01 DIAGNOSIS — Z96649 Presence of unspecified artificial hip joint: Secondary | ICD-10-CM

## 2014-08-01 DIAGNOSIS — I10 Essential (primary) hypertension: Secondary | ICD-10-CM | POA: Diagnosis present

## 2014-08-01 DIAGNOSIS — M1610 Unilateral primary osteoarthritis, unspecified hip: Secondary | ICD-10-CM | POA: Diagnosis present

## 2014-08-01 HISTORY — PX: TOTAL HIP ARTHROPLASTY: SHX124

## 2014-08-01 SURGERY — ARTHROPLASTY, HIP, TOTAL, ANTERIOR APPROACH
Anesthesia: Spinal | Site: Hip | Laterality: Right

## 2014-08-01 MED ORDER — GLYCOPYRROLATE 0.2 MG/ML IJ SOLN
INTRAMUSCULAR | Status: AC
  Filled 2014-08-01: qty 1

## 2014-08-01 MED ORDER — ASPIRIN EC 325 MG PO TBEC
325.0000 mg | DELAYED_RELEASE_TABLET | Freq: Two times a day (BID) | ORAL | Status: DC
  Administered 2014-08-01 – 2014-08-03 (×4): 325 mg via ORAL
  Filled 2014-08-01 (×5): qty 1

## 2014-08-01 MED ORDER — CHLORHEXIDINE GLUCONATE 4 % EX LIQD
60.0000 mL | Freq: Once | CUTANEOUS | Status: DC
  Filled 2014-08-01: qty 60

## 2014-08-01 MED ORDER — ONDANSETRON HCL 4 MG PO TABS: 4.0000 mg | ORAL_TABLET | Freq: Four times a day (QID) | ORAL | Status: DC | PRN

## 2014-08-01 MED ORDER — PHENOL 1.4 % MT LIQD: 1.0000 | OROMUCOSAL | Status: DC | PRN

## 2014-08-01 MED ORDER — SENNA 8.6 MG PO TABS
1.0000 | ORAL_TABLET | Freq: Two times a day (BID) | ORAL | Status: DC
  Administered 2014-08-01 – 2014-08-03 (×4): 8.6 mg via ORAL
  Filled 2014-08-01 (×5): qty 1

## 2014-08-01 MED ORDER — KETOROLAC TROMETHAMINE 15 MG/ML IJ SOLN
INTRAMUSCULAR | Status: AC
  Filled 2014-08-01: qty 1

## 2014-08-01 MED ORDER — METOPROLOL TARTRATE 25 MG PO TABS
25.0000 mg | ORAL_TABLET | Freq: Two times a day (BID) | ORAL | Status: DC
  Filled 2014-08-01 (×5): qty 1

## 2014-08-01 MED ORDER — GLYCOPYRROLATE 0.2 MG/ML IJ SOLN
INTRAMUSCULAR | Status: DC | PRN
  Administered 2014-08-01 (×3): 0.2 mg via INTRAVENOUS

## 2014-08-01 MED ORDER — SODIUM CHLORIDE 0.9 % IV SOLN
INTRAVENOUS | Status: DC
  Administered 2014-08-01: 18:00:00 via INTRAVENOUS

## 2014-08-01 MED ORDER — OXYCODONE HCL ER 10 MG PO T12A
10.0000 mg | EXTENDED_RELEASE_TABLET | Freq: Two times a day (BID) | ORAL | Status: DC
  Administered 2014-08-01 – 2014-08-03 (×4): 10 mg via ORAL
  Filled 2014-08-01 (×4): qty 1

## 2014-08-01 MED ORDER — MORPHINE SULFATE 2 MG/ML IJ SOLN
1.0000 mg | INTRAMUSCULAR | Status: DC | PRN
  Administered 2014-08-01: 1 mg via INTRAVENOUS
  Filled 2014-08-01: qty 1

## 2014-08-01 MED ORDER — ALUM & MAG HYDROXIDE-SIMETH 200-200-20 MG/5ML PO SUSP: 30.0000 mL | ORAL | Status: DC | PRN

## 2014-08-01 MED ORDER — ONDANSETRON HCL 4 MG/2ML IJ SOLN: 4.0000 mg | Freq: Four times a day (QID) | INTRAMUSCULAR | Status: DC | PRN

## 2014-08-01 MED ORDER — PROPOFOL 10 MG/ML IV BOLUS
INTRAVENOUS | Status: AC
  Filled 2014-08-01: qty 20

## 2014-08-01 MED ORDER — FENTANYL CITRATE 0.05 MG/ML IJ SOLN: 25.0000 ug | INTRAMUSCULAR | Status: DC | PRN

## 2014-08-01 MED ORDER — MENTHOL 3 MG MT LOZG
1.0000 | LOZENGE | OROMUCOSAL | Status: DC | PRN
  Filled 2014-08-01: qty 9

## 2014-08-01 MED ORDER — ACETAMINOPHEN 650 MG RE SUPP: 650.0000 mg | Freq: Four times a day (QID) | RECTAL | Status: DC | PRN

## 2014-08-01 MED ORDER — ONDANSETRON HCL 4 MG/2ML IJ SOLN
4.0000 mg | Freq: Four times a day (QID) | INTRAMUSCULAR | Status: DC | PRN
Start: 2014-08-01 — End: 2014-08-01

## 2014-08-01 MED ORDER — ACETAMINOPHEN 325 MG PO TABS: 650.0000 mg | ORAL_TABLET | Freq: Four times a day (QID) | ORAL | Status: DC | PRN

## 2014-08-01 MED ORDER — SORBITOL 70 % SOLN: 30.0000 mL | Freq: Every day | Status: DC | PRN

## 2014-08-01 MED ORDER — LACTATED RINGERS IV SOLN: INTRAVENOUS | Status: DC | PRN

## 2014-08-01 MED ORDER — KETOROLAC TROMETHAMINE 15 MG/ML IJ SOLN
7.5000 mg | Freq: Four times a day (QID) | INTRAMUSCULAR | Status: AC
  Administered 2014-08-01 – 2014-08-02 (×3): 7.5 mg via INTRAVENOUS
  Filled 2014-08-01 (×2): qty 1

## 2014-08-01 MED ORDER — CEFAZOLIN SODIUM-DEXTROSE 2-3 GM-% IV SOLR
INTRAVENOUS | Status: AC
  Filled 2014-08-01: qty 50

## 2014-08-01 MED ORDER — METOCLOPRAMIDE HCL 5 MG/ML IJ SOLN: 5.0000 mg | Freq: Three times a day (TID) | INTRAMUSCULAR | Status: DC | PRN

## 2014-08-01 MED ORDER — 0.9 % SODIUM CHLORIDE (POUR BTL) OPTIME
TOPICAL | Status: DC | PRN
Start: 2014-08-01 — End: 2014-08-01
  Administered 2014-08-01: 1000 mL

## 2014-08-01 MED ORDER — BUPIVACAINE IN DEXTROSE 0.75-8.25 % IT SOLN
INTRATHECAL | Status: DC | PRN
  Administered 2014-08-01: 1.4 mL via INTRATHECAL

## 2014-08-01 MED ORDER — METOCLOPRAMIDE HCL 10 MG PO TABS: 5.0000 mg | ORAL_TABLET | Freq: Three times a day (TID) | ORAL | Status: DC | PRN

## 2014-08-01 MED ORDER — LACTATED RINGERS IV SOLN
INTRAVENOUS | Status: DC
  Administered 2014-08-01 (×2): via INTRAVENOUS

## 2014-08-01 MED ORDER — SODIUM CHLORIDE 0.9 % IJ SOLN
INTRAMUSCULAR | Status: AC
  Filled 2014-08-01: qty 10

## 2014-08-01 MED ORDER — FENTANYL CITRATE 0.05 MG/ML IJ SOLN
INTRAMUSCULAR | Status: AC
  Filled 2014-08-01: qty 5

## 2014-08-01 MED ORDER — AMLODIPINE BESYLATE 10 MG PO TABS
10.0000 mg | ORAL_TABLET | Freq: Every day | ORAL | Status: DC
  Administered 2014-08-01 – 2014-08-03 (×3): 10 mg via ORAL
  Filled 2014-08-01 (×3): qty 1

## 2014-08-01 MED ORDER — ACETAMINOPHEN 500 MG PO TABS
1000.0000 mg | ORAL_TABLET | Freq: Four times a day (QID) | ORAL | Status: AC
  Administered 2014-08-01 – 2014-08-02 (×4): 1000 mg via ORAL
  Filled 2014-08-01 (×4): qty 2

## 2014-08-01 MED ORDER — LORATADINE 10 MG PO TABS
10.0000 mg | ORAL_TABLET | Freq: Every day | ORAL | Status: DC
  Administered 2014-08-01 – 2014-08-03 (×3): 10 mg via ORAL
  Filled 2014-08-01 (×3): qty 1

## 2014-08-01 MED ORDER — PRAVASTATIN SODIUM 20 MG PO TABS
20.0000 mg | ORAL_TABLET | Freq: Every day | ORAL | Status: DC
  Administered 2014-08-01 – 2014-08-02 (×2): 20 mg via ORAL
  Filled 2014-08-01 (×3): qty 1

## 2014-08-01 MED ORDER — DIPHENHYDRAMINE HCL 12.5 MG/5ML PO ELIX: 25.0000 mg | ORAL_SOLUTION | ORAL | Status: DC | PRN

## 2014-08-01 MED ORDER — EPHEDRINE SULFATE 50 MG/ML IJ SOLN
INTRAMUSCULAR | Status: AC
  Filled 2014-08-01: qty 1

## 2014-08-01 MED ORDER — PROPOFOL 10 MG/ML IV BOLUS
INTRAVENOUS | Status: DC | PRN
  Administered 2014-08-01 (×2): 20 mg via INTRAVENOUS
  Administered 2014-08-01 (×2): 10 mg via INTRAVENOUS
  Administered 2014-08-01: 30 mg via INTRAVENOUS
  Administered 2014-08-01: 20 mg via INTRAVENOUS
  Administered 2014-08-01 (×3): 10 mg via INTRAVENOUS
  Administered 2014-08-01: 20 mg via INTRAVENOUS
  Administered 2014-08-01 (×4): 10 mg via INTRAVENOUS
  Administered 2014-08-01: 20 mg via INTRAVENOUS
  Administered 2014-08-01 (×2): 10 mg via INTRAVENOUS
  Administered 2014-08-01 (×2): 20 mg via INTRAVENOUS
  Administered 2014-08-01: 10 mg via INTRAVENOUS
  Administered 2014-08-01: 20 mg via INTRAVENOUS
  Administered 2014-08-01 (×3): 10 mg via INTRAVENOUS
  Administered 2014-08-01: 20 mg via INTRAVENOUS
  Administered 2014-08-01: 10 mg via INTRAVENOUS
  Administered 2014-08-01: 20 mg via INTRAVENOUS
  Administered 2014-08-01: 10 mg via INTRAVENOUS
  Administered 2014-08-01: 20 mg via INTRAVENOUS
  Administered 2014-08-01 (×3): 10 mg via INTRAVENOUS

## 2014-08-01 MED ORDER — SODIUM CHLORIDE 0.9 % IV SOLN
INTRAVENOUS | Status: DC | PRN
  Administered 2014-08-01: 60 mL

## 2014-08-01 MED ORDER — HYDROCODONE-ACETAMINOPHEN 7.5-325 MG PO TABS
1.0000 | ORAL_TABLET | ORAL | Status: DC | PRN
  Filled 2014-08-01: qty 2

## 2014-08-01 MED ORDER — CEFAZOLIN SODIUM-DEXTROSE 2-3 GM-% IV SOLR
2.0000 g | Freq: Four times a day (QID) | INTRAVENOUS | Status: AC
  Administered 2014-08-01 (×2): 2 g via INTRAVENOUS
  Filled 2014-08-01 (×2): qty 50

## 2014-08-01 MED ORDER — MAGNESIUM CITRATE PO SOLN: 1.0000 | Freq: Once | ORAL | Status: AC | PRN

## 2014-08-01 MED ORDER — POLYETHYLENE GLYCOL 3350 17 G PO PACK
17.0000 g | PACK | Freq: Every day | ORAL | Status: DC | PRN
  Administered 2014-08-02: 17 g via ORAL

## 2014-08-01 MED ORDER — FENTANYL CITRATE 0.05 MG/ML IJ SOLN
INTRAMUSCULAR | Status: DC | PRN
  Administered 2014-08-01: 25 ug via INTRAVENOUS

## 2014-08-01 SURGICAL SUPPLY — 55 items
BLADE SAW SGTL 18X1.27X75 (BLADE) ×2 IMPLANT
BLADE SAW SGTL 18X1.27X75MM (BLADE) ×1
BNDG COHESIVE 6X5 TAN STRL LF (GAUZE/BANDAGES/DRESSINGS) ×3 IMPLANT
CAPT HIP TOTAL 2 ×2 IMPLANT
CELLS DAT CNTRL 66122 CELL SVR (MISCELLANEOUS) ×1 IMPLANT
DRAPE C-ARM 42X72 X-RAY (DRAPES) ×3 IMPLANT
DRAPE IMP U-DRAPE 54X76 (DRAPES) ×3 IMPLANT
DRAPE STERI IOBAN 125X83 (DRAPES) ×3 IMPLANT
DRAPE U-SHAPE 47X51 STRL (DRAPES) ×9 IMPLANT
DRSG AQUACEL AG ADV 3.5X10 (GAUZE/BANDAGES/DRESSINGS) ×3 IMPLANT
DURAPREP 26ML APPLICATOR (WOUND CARE) ×3 IMPLANT
ELECT BLADE 4.0 EZ CLEAN MEGAD (MISCELLANEOUS) ×3
ELECT REM PT RETURN 9FT ADLT (ELECTROSURGICAL) ×3
ELECTRODE BLDE 4.0 EZ CLN MEGD (MISCELLANEOUS) ×1 IMPLANT
ELECTRODE REM PT RTRN 9FT ADLT (ELECTROSURGICAL) ×1 IMPLANT
FACESHIELD WRAPAROUND (MASK) ×3 IMPLANT
FACESHIELD WRAPAROUND OR TEAM (MASK) ×1 IMPLANT
GLOVE BIO SURGEON STRL SZ7.5 (GLOVE) ×2 IMPLANT
GLOVE INDICATOR 6.5 STRL GRN (GLOVE) ×2 IMPLANT
GLOVE NEODERM STRL 7.5 LF PF (GLOVE) ×2 IMPLANT
GLOVE SURG NEODERM 7.5  LF PF (GLOVE) ×4
GLOVE SURG SYN 7.5  E (GLOVE) ×2
GLOVE SURG SYN 7.5 E (GLOVE) ×1 IMPLANT
GLOVE SURG SYN 7.5 PF PI (GLOVE) ×1 IMPLANT
GOWN SRG XL XLNG 56XLVL 4 (GOWN DISPOSABLE) ×2 IMPLANT
GOWN STRL NON-REIN XL XLG LVL4 (GOWN DISPOSABLE) ×6
HANDPIECE INTERPULSE COAX TIP (DISPOSABLE) ×3
KIT BASIN OR (CUSTOM PROCEDURE TRAY) ×3 IMPLANT
MARKER SKIN DUAL TIP RULER LAB (MISCELLANEOUS) ×3 IMPLANT
NDL HYPO 25GX1X1/2 BEV (NEEDLE) IMPLANT
NDL SPNL 18GX3.5 QUINCKE PK (NEEDLE) IMPLANT
NEEDLE HYPO 25GX1X1/2 BEV (NEEDLE) ×3 IMPLANT
NEEDLE SPNL 18GX3.5 QUINCKE PK (NEEDLE) ×3 IMPLANT
PACK TOTAL JOINT (CUSTOM PROCEDURE TRAY) ×3 IMPLANT
PACK UNIVERSAL I (CUSTOM PROCEDURE TRAY) ×3 IMPLANT
PADDING CAST COTTON 6X4 STRL (CAST SUPPLIES) ×3 IMPLANT
RETRACTOR WND ALEXIS 18 MED (MISCELLANEOUS) ×1 IMPLANT
RTRCTR WOUND ALEXIS 18CM MED (MISCELLANEOUS) ×3
SET HNDPC FAN SPRY TIP SCT (DISPOSABLE) ×1 IMPLANT
SOLUTION BETADINE 4OZ (MISCELLANEOUS) ×1 IMPLANT
SUT ETHIBOND 2 OS 4 DA (SUTURE) ×2 IMPLANT
SUT ETHIBOND NAB CT1 #1 30IN (SUTURE) ×11 IMPLANT
SUT ETHILON 2 0 FS 18 (SUTURE) ×4 IMPLANT
SUT ETHILON 3 0 FSL (SUTURE) ×3 IMPLANT
SUT VIC AB 0 CT1 27 (SUTURE) ×3
SUT VIC AB 0 CT1 27XBRD ANBCTR (SUTURE) ×1 IMPLANT
SUT VIC AB 1 CT1 27 (SUTURE) ×3
SUT VIC AB 1 CT1 27XBRD ANBCTR (SUTURE) ×1 IMPLANT
SUT VIC AB 2-0 CT1 27 (SUTURE) ×6
SUT VIC AB 2-0 CT1 TAPERPNT 27 (SUTURE) ×2 IMPLANT
SYR 20CC LL (SYRINGE) ×3 IMPLANT
SYR 50ML SLIP (SYRINGE) ×2 IMPLANT
SYR CONTROL 10ML LL (SYRINGE) ×2 IMPLANT
TOWEL OR 17X26 10 PK STRL BLUE (TOWEL DISPOSABLE) ×3 IMPLANT
TRAY FOLEY CATH 16FR SILVER (SET/KITS/TRAYS/PACK) ×7 IMPLANT

## 2014-08-01 NOTE — Discharge Instructions (Signed)
1. Remove dressing on 08/08/14.  2. Begin betadine paints to incision twice a day with dry dressing. 3. May get incision wet while showering after 10 days.  Do not submerge incision until notified. 4. WBAT 5. Ice the operative 20 minutes on and 20 minutes off around the clock.  6. Avoid any dental visits including dental cleanings for 6 months after surgery.

## 2014-08-01 NOTE — Anesthesia Preprocedure Evaluation (Addendum)
Anesthesia Evaluation  Patient identified by MRN, date of birth, ID band Patient awake    Reviewed: Allergy & Precautions, NPO status , Patient's Chart, lab work & pertinent test results  Airway Mallampati: II   Neck ROM: full    Dental  (+) Edentulous Upper, Edentulous Lower, Dental Advisory Given   Pulmonary former smoker,          Cardiovascular hypertension,  Normal stress test and TTE recently.   Neuro/Psych Anxiety    GI/Hepatic   Endo/Other    Renal/GU      Musculoskeletal  (+) Arthritis -, Osteoarthritis,    Abdominal   Peds  Hematology   Anesthesia Other Findings   Reproductive/Obstetrics                            Anesthesia Physical Anesthesia Plan  ASA: II  Anesthesia Plan: Spinal   Post-op Pain Management:    Induction: Intravenous  Airway Management Planned: Simple Face Mask  Additional Equipment:   Intra-op Plan:   Post-operative Plan:   Informed Consent: I have reviewed the patients History and Physical, chart, labs and discussed the procedure including the risks, benefits and alternatives for the proposed anesthesia with the patient or authorized representative who has indicated his/her understanding and acceptance.     Plan Discussed with: CRNA, Anesthesiologist and Surgeon  Anesthesia Plan Comments:         Anesthesia Quick Evaluation

## 2014-08-01 NOTE — Anesthesia Postprocedure Evaluation (Signed)
  Anesthesia Post-op Note  Patient: Mary Merritt  Procedure(s) Performed: Procedure(s): RIGHT TOTAL HIP ARTHROPLASTY ANTERIOR APPROACH (Right)  Patient Location: PACU  Anesthesia Type:Spinal  Level of Consciousness: awake and alert   Airway and Oxygen Therapy: Patient Spontanous Breathing  Post-op Pain: none  Post-op Assessment: Post-op Vital signs reviewed  Post-op Vital Signs: Reviewed  Last Vitals:  Filed Vitals:   08/01/14 1615  BP: 124/75  Pulse: 59  Temp:   Resp: 18    Complications: No apparent anesthesia complications

## 2014-08-01 NOTE — Op Note (Signed)
RIGHT TOTAL HIP ARTHROPLASTY ANTERIOR APPROACH  Procedure Note Mary Merritt   [id number]  Pre-op Diagnosis: right hip osteoarthritis     Post-op Diagnosis: same   Operative Procedures  1. Total hip replacement; Right hip; uncemented [id number]   Personnel  Surgeon(s): Naiping Eduard Roux, MD   Anesthesia: spinal, epidural   Prosthesis: Depuy Acetabulum: Pinnacle Gription 52 mm Femur: Corail KA 14 Std Head: 36 size: -2 Bearing Type: Cobalt chrominium on poly  Date of Service: 08/01/2014  Total Hip Arthroplasty (Anterior Approach) Op Note:  After informed consent was obtained and the operative extremity marked in the holding area, the patient was brought back to the operating room and placed supine on the HANA table. Next, the operative extremity was prepped and draped in normal sterile fashion. Surgical timeout occurred verifying patient identification, surgical site, surgical procedure and administration of antibiotics.  A modified anterior Smith-Peterson approach to the hip was performed, using the interval between tensor fascia lata and sartorius.  Dissection was carried bluntly down onto the anterior hip capsule. The lateral femoral circumflex vessels were identified and coagulated. A capsulotomy was performed and the capsular flaps tagged for later repair.  Fluoroscopy was utilized to prepare for the femoral neck cut. The neck osteotomy was performed. The femoral head was removed, the acetabular rim was cleared of soft tissue and attention was turned to reaming the acetabulum.  Sequential reaming was performed under fluoroscopic guidance. We reamed to a size 52 mm, and then impacted the acetabular shell. The liner was then placed after irrigation and attention turned to the femur.  After placing the femoral hook, the leg was taken to externally rotated, extended and adducted position taking care to perform soft tissue releases to allow for adequate mobilization of the femur. Soft  tissue was cleared from the shoulder of the greater trochanter and the hook elevator used to improve exposure of the proximal femur. Sequential broaching performed up to a size 14. Trial neck and head were placed. The leg was brought back up to neutral and the construct reduced. The position and sizing of components, offset and leg lengths were checked using fluoroscopy. Stability of the construct was checked in extension and external rotation without any subluxation or impingement of prosthesis. We dislocated the prosthesis, dropped the leg back into position, removed trial components, and irrigated copiously. The final stem and head was then placed, the leg brought back up, the system reduced and fluoroscopy used to verify positioning.  We irrigated, obtained hemostasis and closed the capsule using #2 ethibond suture.  Dilute betadyne solution was used. The fascia was closed with #1 vicryl plus, the deep fat layer was closed with 0 vicryl, the subcutaneous layers closed with 2.0 Vicryl Plus and the skin closed with 2.0 nylon. A sterile dressing was applied. The patient was awakened in the operating room and taken to recovery in stable condition. All sponge, needle, and instrument counts were correct at the end of the case.   Position: supine  Complications: none.  Time Out: performed   Drains/Packing: none  Estimated blood loss: 250 cc  Returned to Recovery Room: in good condition.   Antibiotics: yes   Mechanical VTE (DVT) Prophylaxis: sequential compression devices, TED thigh-high  Chemical VTE (DVT) Prophylaxis: aspirin (other pharmacologic prophylaxis not indicated - bleeding risk)   Fluid Replacement: see anesthesia record  Specimens Removed: 1 to pathology   Sponge and Instrument Count Correct? yes   PACU: portable radiograph - low AP   Admission:  inpatient status, start PT & OT POD#1  Plan/RTC: Return in 2 weeks for staple removal. Return in 6 weeks to see MD.  Weight  Bearing/Load Lower Extremity: full  Hip precautions: none Suture Removal: 10-14 days  Betadine to incision twice daily once dressing is removed on POD#7  N. Eduard Roux, MD [place] 2:49 PM      Implant Name Type Inv. Item Serial No. Manufacturer Lot No. LRB No. Used  PIN SECTOR W/GRIP ACE CUP 52MM - [id number] Hips PIN SECTOR W/GRIP ACE CUP 52MM  DEPUY  Right 1  SCREW 20MM - [id number] Screw SCREW 20MM  DEPUY  Right 1  LINER NEUTRAL 52X36MM PLUS 4 - [id number] Liner LINER NEUTRAL 52X36MM PLUS 4  DEPUY  Right 1  Corail Hip system cementless femoral stem ka size 14 standard collar     [id number] Right 1  SROM M HEAD 36MM 2 - [id number] Hips SROM M HEAD 36MM 2   DEPUY   Right 1

## 2014-08-01 NOTE — Anesthesia Procedure Notes (Addendum)
Spinal Patient location during procedure: OR Start time: 08/01/2014 12:21 PM End time: 08/01/2014 12:27 PM Staffing Anesthesiologist: HODIERNE, ADAM Performed by: anesthesiologist  Preanesthetic Checklist Completed: patient identified, site marked, surgical consent, pre-op evaluation, timeout performed, IV checked, risks and benefits discussed and monitors and equipment checked Spinal Block Patient position: sitting Prep: Betadine Patient monitoring: heart rate, cardiac monitor, continuous pulse ox and blood pressure Approach: midline Location: L3-4 Injection technique: single-shot Needle Needle type: Pencan  Needle gauge: 24 G Needle length: 10 cm Assessment Sensory level: T6 Additional Notes Pt tolerated the procedure well.  Procedure Name: MAC Date/Time: 08/01/2014 12:22 PM Performed by: Barrington Ellison Pre-anesthesia Checklist: Patient identified, Emergency Drugs available, Suction available, Patient being monitored and Timeout performed Patient Re-evaluated:Patient Re-evaluated prior to inductionOxygen Delivery Method: Nasal cannula

## 2014-08-01 NOTE — Transfer of Care (Signed)
Immediate Anesthesia Transfer of Care Note  Patient: Mary Merritt  Procedure(s) Performed: Procedure(s): RIGHT TOTAL HIP ARTHROPLASTY ANTERIOR APPROACH (Right)  Patient Location: PACU  Anesthesia Type:Spinal  Level of Consciousness: awake, alert  and oriented  Airway & Oxygen Therapy: Patient Spontanous Breathing and Patient connected to nasal cannula oxygen  Post-op Assessment: Report given to RN  Post vital signs: Reviewed and stable  Last Vitals:  Filed Vitals:   08/01/14 1048  BP: 175/61  Pulse: 77  Temp: 36.1 C  Resp: 18    Complications: No apparent anesthesia complications

## 2014-08-01 NOTE — OR Nursing (Signed)
When inserting foley catheter, small posterior vaginal opening tear, small amount of blood, Dr. Erlinda Hong notified, will watch at this time, no orders received. Jene Every, RNFA

## 2014-08-01 NOTE — Progress Notes (Signed)
Utilization review completed.  

## 2014-08-02 ENCOUNTER — Encounter (HOSPITAL_COMMUNITY): Payer: Self-pay | Admitting: Orthopaedic Surgery

## 2014-08-02 LAB — CBC
HEMATOCRIT: 32.7 % — AB (ref 36.0–46.0)
HEMOGLOBIN: 11 g/dL — AB (ref 12.0–15.0)
MCH: 30.3 pg (ref 26.0–34.0)
MCHC: 33.6 g/dL (ref 30.0–36.0)
MCV: 90.1 fL (ref 78.0–100.0)
Platelets: 193 10*3/uL (ref 150–400)
RBC: 3.63 MIL/uL — ABNORMAL LOW (ref 3.87–5.11)
RDW: 13.3 % (ref 11.5–15.5)
WBC: 6.6 10*3/uL (ref 4.0–10.5)

## 2014-08-02 LAB — BASIC METABOLIC PANEL
ANION GAP: 4 — AB (ref 5–15)
BUN: 12 mg/dL (ref 6–23)
CALCIUM: 8.4 mg/dL (ref 8.4–10.5)
CO2: 29 mmol/L (ref 19–32)
Chloride: 102 mmol/L (ref 96–112)
Creatinine, Ser: 0.91 mg/dL (ref 0.50–1.10)
GFR, EST AFRICAN AMERICAN: 65 mL/min — AB (ref >90)
GFR, EST NON AFRICAN AMERICAN: 56 mL/min — AB (ref >90)
Glucose, Bld: 94 mg/dL (ref 70–99)
POTASSIUM: 4.1 mmol/L (ref 3.5–5.1)
SODIUM: 135 mmol/L (ref 135–145)

## 2014-08-02 LAB — GLUCOSE, CAPILLARY: Glucose-Capillary: 142 mg/dL — ABNORMAL HIGH (ref 70–99)

## 2014-08-02 MED ORDER — BACLOFEN 10 MG PO TABS
10.0000 mg | ORAL_TABLET | Freq: Three times a day (TID) | ORAL | Status: DC | PRN
  Administered 2014-08-02: 10 mg via ORAL
  Filled 2014-08-02: qty 1

## 2014-08-02 NOTE — Progress Notes (Signed)
   Subjective:  Patient reports pain as mild.    Objective:   VITALS:   Filed Vitals:   08/01/14 1718 08/01/14 2123 08/02/14 0125 08/02/14 0501  BP: 151/54 114/33 115/47 116/40  Pulse: 58 58 62 67  Temp: 97.5 F (36.4 C) 98.2 F (36.8 C) 98.1 F (36.7 C) 98.2 F (36.8 C)  TempSrc:  Oral Oral Oral  Resp: 16     Height:      Weight:      SpO2: 100% 97% 97% 97%    Neurologically intact Neurovascular intact Sensation intact distally Intact pulses distally Dorsiflexion/Plantar flexion intact Incision: dressing C/D/I and no drainage No cellulitis present Compartment soft   Lab Results  Component Value Date   WBC 6.6 08/02/2014   HGB 11.0* 08/02/2014   HCT 32.7* 08/02/2014   MCV 90.1 08/02/2014   PLT 193 08/02/2014     Assessment/Plan:  1 Day Post-Op   - Expected postop acute blood loss anemia - will monitor for symptoms - Up with PT/OT - DVT ppx - SCDs, ambulation, asa - WBAT right lower extremity, no hip precautions - Pain control - Discharge planning  Marianna Payment 08/02/2014, 7:36 AM [phone]

## 2014-08-02 NOTE — Evaluation (Signed)
Occupational Therapy Evaluation Patient Details Name: Mary Merritt MRN: [id number] DOB: 1927-07-12 Today's Date: 08/02/2014    History of Present Illness s/p RIGHT TOTAL HIP ARTHROPLASTY ANTERIOR APPROACH (Right)   Clinical Impression   Patient independent PTA. Patient currently requires up to mod assist for LB ADLs and min guard for functional mobility/transfers. Patient will benefit from acute OT to increase overall independence in the areas of ADLs, functional mobility, and overall safety in order to safely discharge to venue listed below. Patient stated interest in discharging to [facility], patient will not have the needed 24/7 supervision/assistance once discharged > home.     Follow Up Recommendations  SNF;Supervision/Assistance - 24 hour    Equipment Recommendations  3 in 1 bedside comode    Recommendations for Other Services  None at this time     Precautions / Restrictions Precautions Precautions: Fall Restrictions Weight Bearing Restrictions: Yes RLE Weight Bearing: Weight bearing as tolerated      Mobility Bed Mobility General bed mobility comments: Patient seated in recliner upon OT entering room  Transfers Overall transfer level: Needs assistance Equipment used: Rolling walker (2 wheeled) Transfers: Sit to/from [employer] Sit to Stand: Min guard Stand pivot transfers: Min guard       General transfer comment: Cues for hand placement and RW safety    Balance Overall balance assessment: Needs assistance Sitting-balance support: Feet supported;No upper extremity supported Sitting balance-Leahy Scale: Good     Standing balance support: During functional activity;Bilateral upper extremity supported Standing balance-Leahy Scale: Fair     ADL Overall ADL's : Needs assistance/impaired Eating/Feeding: Independent   Grooming: Supervision/safety;Standing   Upper Body Bathing: Set up;Sitting   Lower Body Bathing: Moderate assistance;Sit  to/from stand   Upper Body Dressing : Set up;Sitting   Lower Body Dressing: Moderate assistance;Sit to/from stand   Toilet Transfer: Minimal assistance;RW;BSC;Ambulation   Toileting- Clothing Manipulation and Hygiene: Total assistance       Functional mobility during ADLs: Min guard General ADL Comments: Patient able to cross LLE for LB ADLs, unable to cross right or bend down to right. Patient will benefit from stretching or AE to increase independence with LB ADLs. Patient with no hip precautions and is WBAT. Verbal cues and min guard required for functional mobility with use of RW. Patient's sats decreased > high 70's, but quickly increased to greater than 90% when educated and encouraged to perfrom pursed lip breathing.                Pertinent Vitals/Pain Pain Assessment: No/denies pain     Hand Dominance Right   Extremity/Trunk Assessment Upper Extremity Assessment Upper Extremity Assessment: Overall WFL for tasks assessed   Lower Extremity Assessment Lower Extremity Assessment: Defer to PT evaluation   Cervical / Trunk Assessment Cervical / Trunk Assessment: Normal   Communication Communication Communication: No difficulties   Cognition Arousal/Alertness: Awake/alert Behavior During Therapy: WFL for tasks assessed/performed Overall Cognitive Status: Within Functional Limits for tasks assessed             Home Living Family/patient expects to be discharged to:: Private residence Living Arrangements: Alone Available Help at Discharge: Family;Available PRN/intermittently Type of Home: House Home Access: Stairs to enter [employer] of Steps: 3 Entrance Stairs-Rails: Right Home Layout: One level     Bathroom Shower/Tub: Walk-in shower;Door   [employer] Toilet: Standard     Home Equipment: Clinical cytogeneticist - 2 wheels          Prior Functioning/Environment Level of  Independence: Independent        Comments: Patient reports the only  thing she needed help with was mowing    OT Diagnosis: Generalized weakness   OT Problem List: Decreased strength;Decreased range of motion;Decreased activity tolerance;Impaired balance (sitting and/or standing);Decreased safety awareness;Decreased knowledge of use of DME or AE;Decreased knowledge of precautions   OT Treatment/Interventions: Self-care/ADL training;Therapeutic exercise;Energy conservation;DME and/or AE instruction;Therapeutic activities;Patient/family education;Balance training    OT Goals(Current goals can be found in the care plan section) Acute Rehab OT Goals Patient Stated Goal: go to [facility] OT Goal Formulation: With patient/family Time For Goal Achievement: 08/09/14 Potential to Achieve Goals: Good ADL Goals Pt Will Perform Lower Body Bathing: with modified independence;sit to/from stand;with adaptive equipment Pt Will Perform Lower Body Dressing: with modified independence;sit to/from stand;with adaptive equipment Pt Will Transfer to Toilet: with modified independence;ambulating;bedside commode Pt Will Perform Tub/Shower Transfer: with modified independence;ambulating;shower seat;Shower transfer Additional ADL Goal #1: Patient will independently perform pursed lip breathing prn to maintain 02 sats -> greater than 90%  OT Frequency: Min 2X/week   Barriers to D/C: Decreased caregiver support          End of Session Equipment Utilized During Treatment: Gait belt;Rolling walker  Activity Tolerance: Patient tolerated treatment well Patient left: in chair;with call bell/phone within reach;with family/visitor present   Time: 1207-1226 OT Time Calculation (min): 19 min Charges:  OT Evaluation $Initial OT Evaluation Tier I: 1 Procedure  Mary Merritt , MS, OTR/L, CLT Pager: [phone]  08/02/2014, 1:10 PM

## 2014-08-02 NOTE — Evaluation (Signed)
Physical Therapy Evaluation Patient Details Name: Mary Merritt MRN: [id number] DOB: 01-12-1928 Today's Date: 08/02/2014   History of Present Illness  81 y.o. female admitted to [facility] on 08/01/14 for R direct anterior THA.  Pt is now WBAT.  Pt has significant PMHx of HTN, anxiety, dysrhythmia, and AAA repair.  Clinical Impression  Pt is POD #1 and is moving well and min guard to min assist level for all mobility.  She was able to walk into the hallway with RW and initiate her hip exercises. She lives at home alone and has intermittent help at d/c.  For this reason we are pursing SNF for rehab to get her to mod I level before going home alone.   PT to follow acutely for deficits listed below.       Follow Up Recommendations SNF    Equipment Recommendations  Other (comment) (per pt she is going to borrow a RW)    Recommendations for Other Services   NA    Precautions / Restrictions Precautions Precautions: Fall Restrictions Weight Bearing Restrictions: Yes RLE Weight Bearing: Weight bearing as tolerated      Mobility  Bed Mobility Overal bed mobility: Needs Assistance Bed Mobility: Supine to Sit;Sit to Supine     Supine to sit: Modified independent (Device/Increase time) Sit to supine: Min assist   General bed mobility comments: HOB elevated and pt using bed rail to pull to sitting EOB. Min assist to help lift both legs back into the bed to get to supine.   Transfers Overall transfer level: Needs assistance Equipment used: Rolling walker (2 wheeled) Transfers: Sit to/from Stand Sit to Stand: Min guard Stand pivot transfers: Min guard       General transfer comment: Min guard assist for safety due to slow and siff transition.  Verbal cues for safe hand placement.   Ambulation/Gait Ambulation/Gait assistance: Min guard Ambulation Distance (Feet): 120 Feet Assistive device: Rolling walker (2 wheeled) Gait Pattern/deviations: Step-through pattern;Antalgic Gait velocity:  decreased Gait velocity interpretation: Below normal speed for age/gender General Gait Details: mildly antalgic gait pattern with verbal cues needed for upright posture.          Balance Overall balance assessment: Needs assistance Sitting-balance support: Feet supported;No upper extremity supported Sitting balance-Leahy Scale: Good     Standing balance support: Bilateral upper extremity supported;No upper extremity supported;Single extremity supported Standing balance-Leahy Scale: Fair                               Pertinent Vitals/Pain Pain Assessment: No/denies pain    Home Living Family/patient expects to be discharged to:: Private residence Living Arrangements: Alone Available Help at Discharge: Family;Available PRN/intermittently Type of Home: House Home Access: Stairs to enter Entrance Stairs-Rails: Right Entrance Stairs-Number of Steps: 3 Home Layout: One level Home Equipment: Clinical cytogeneticist - 2 wheels      Prior Function Level of Independence: Independent         Comments: Patient reports the only thing she needed help with was mowing (per OT note)     Hand Dominance   Dominant Hand: Right    Extremity/Trunk Assessment   Upper Extremity Assessment: Defer to OT evaluation           Lower Extremity Assessment: RLE deficits/detail RLE Deficits / Details: right leg with normal post op pain and weakness.  Ankle 3/5, knee 3/5, hip 2+/5    Cervical / Trunk Assessment: Normal  Communication  Communication: No difficulties  Cognition Arousal/Alertness: Awake/alert Behavior During Therapy: WFL for tasks assessed/performed Overall Cognitive Status: Within Functional Limits for tasks assessed                         Exercises Total Joint Exercises Ankle Circles/Pumps: AROM;Both;20 reps;Supine Quad Sets: AROM;Right;10 reps;Supine Short Arc Quad: AROM;Right;10 reps;Supine Heel Slides: AROM;Right;10 reps;Supine Hip  ABduction/ADduction: AROM;Right;10 reps;Supine      Assessment/Plan    PT Assessment Patient needs continued PT services  PT Diagnosis Difficulty walking;Abnormality of gait;Generalized weakness;Acute pain   PT Problem List Decreased strength;Decreased range of motion;Decreased activity tolerance;Decreased mobility;Decreased balance;Decreased knowledge of use of DME;Pain  PT Treatment Interventions DME instruction;Stair training;Gait training;Functional mobility training;Therapeutic activities;Therapeutic exercise;Balance training;Neuromuscular re-education;Patient/family education;Manual techniques;Modalities   PT Goals (Current goals can be found in the Care Plan section) Acute Rehab PT Goals Patient Stated Goal: go to [facility] PT Goal Formulation: With patient Time For Goal Achievement: 08/09/14 Potential to Achieve Goals: Good    Frequency 7X/week   Barriers to discharge Decreased caregiver support intermittent help at d/c       End of Session   Activity Tolerance: Patient tolerated treatment well Patient left: in bed;with call bell/phone within reach           Time: 1444-1510 PT Time Calculation (min) (ACUTE ONLY): 26 min   Charges:   PT Evaluation $Initial PT Evaluation Tier I: 1 Procedure PT Treatments $Gait Training: 8-22 mins        Oswin Johal B. [place], [place], DPT [phone]   08/02/2014, 4:24 PM

## 2014-08-03 LAB — CBC
HCT: 31.7 % — ABNORMAL LOW (ref 36.0–46.0)
Hemoglobin: 10.7 g/dL — ABNORMAL LOW (ref 12.0–15.0)
MCH: 29.7 pg (ref 26.0–34.0)
MCHC: 33.8 g/dL (ref 30.0–36.0)
MCV: 88.1 fL (ref 78.0–100.0)
PLATELETS: 189 10*3/uL (ref 150–400)
RBC: 3.6 MIL/uL — AB (ref 3.87–5.11)
RDW: 13.3 % (ref 11.5–15.5)
WBC: 8.6 10*3/uL (ref 4.0–10.5)

## 2014-08-03 LAB — GLUCOSE, CAPILLARY: GLUCOSE-CAPILLARY: 86 mg/dL (ref 70–99)

## 2014-08-03 NOTE — Progress Notes (Signed)
   Subjective:  Did well with PT.    Objective:   VITALS:   Filed Vitals:   08/02/14 2200 08/02/14 2349 08/03/14 0326 08/03/14 0600  BP: 118/47   108/56  Pulse: 71   93  Temp: 98.3 F (36.8 C)   98.6 F (37 C)  TempSrc:      Resp: 16 16 16 18   Height:      Weight:      SpO2: 98% 97% 97% 95%    Neurologically intact Neurovascular intact Sensation intact distally Intact pulses distally Dorsiflexion/Plantar flexion intact Incision: dressing C/D/I and no drainage No cellulitis present Compartment soft   Lab Results  Component Value Date   WBC 8.6 08/03/2014   HGB 10.7* 08/03/2014   HCT 31.7* 08/03/2014   MCV 88.1 08/03/2014   PLT 189 08/03/2014     Assessment/Plan:  2 Days Post-Op   - Hgb stable - may dc to SNF today if ok from PT standpoint - Rx in chart  Marianna Payment 08/03/2014, 7:42 AM [phone]

## 2014-08-03 NOTE — Progress Notes (Signed)
Called report to [facility]. Gave report to receiving RN.

## 2014-08-03 NOTE — Clinical Social Work Psychosocial (Signed)
Clinical Social Work Department BRIEF PSYCHOSOCIAL ASSESSMENT 08/03/2014  Patient:  Mary Merritt, Mary Merritt     Account Number:  [id number]     Admit date:  08/01/2014  Clinical Social Worker:  Wylene Men  Date/Time:  08/03/2014 11:05 AM  Referred by:  Physician  Date Referred:  08/03/2014 Referred for  SNF Placement  Psychosocial assessment   Other Referral:   none   Interview type:  Patient Other interview type:   none    PSYCHOSOCIAL DATA Living Status:  ALONE Admitted from facility:   Level of care:   Primary support name:  Wynonia Sours Primary support relationship to patient:  CHILD, ADULT Degree of support available:   adequate    CURRENT CONCERNS Current Concerns  Post-Acute Placement   Other Concerns:   none    SOCIAL WORK ASSESSMENT / PLAN CSW assessed patient at bedside.  Patient was alert and oriented during the course of this assessment.  PT has recommended SNF/STR at time of discharge.  Patient is agreeable for a [facility] search, but first choice is [facility].  Patient is realistic in regards to STR, but would rather not need to go.  CSW normalized these feelings for the patient.  Patient is hopeful to return home after the completion of STR. Patient reports not having supervision asssitance at home at this time.  Patient reports her son and daughter [name] and [name]) are her main supports.  Patient also states that daughter, [name], will be transporting her to SNF once discharged.   Assessment/plan status:  Psychosocial Support/Ongoing Assessment of Needs Other assessment/ plan:   PASARR  FL2   Information/referral to community resources:   SNF/STR    PATIENT'S/FAMILY'S RESPONSE TO PLAN OF CARE: Patient is agreeable to [facility] search and is requesting [facility] as a first choice.       [name], [place] [phone]  Psychiatric & Orthopedics (5N 1-16) Clinical Social Worker

## 2014-08-03 NOTE — Progress Notes (Signed)
Physical Therapy Treatment Patient Details Name: Mary Merritt MRN: [id number] DOB: 30-Jun-1927 Today's Date: 08/03/2014    History of Present Illness 81 y.o. female admitted to [facility] on 08/01/14 for R direct anterior THA.  Pt is now WBAT.  Pt has significant PMHx of HTN, anxiety, dysrhythmia, and AAA repair.    PT Comments    Pt is POD #2 and is progressing well with her mobility despite reports of being more sore and stiff today.  She was able to tolerate standing exercises with one seated rest break and progressed gait nicely further down the hallway.  She is scheduled to d/c to SNF for rehab today to get her to mod I level of mobility before going home alone.  PT will continue to follow acutely.   Follow Up Recommendations  SNF     Equipment Recommendations  Other (comment) (per pt she is going to borrow a RW)    Recommendations for Other Services   NA     Precautions / Restrictions Precautions Precautions: Fall Restrictions RLE Weight Bearing: Weight bearing as tolerated    Mobility   Transfers Overall transfer level: Needs assistance Equipment used: Rolling walker (2 wheeled) Transfers: Sit to/from Stand Sit to Stand: Supervision         General transfer comment: Supervision for safety  Ambulation/Gait Ambulation/Gait assistance: Supervision Ambulation Distance (Feet): 250 Feet Assistive device: Rolling walker (2 wheeled) Gait Pattern/deviations: Step-through pattern;Antalgic     General Gait Details: Mildly antalgic gait pattern and pt self reporting that she is more stiff and sore today than yesterday.  Verbal cues for obstacle navigation in the hallway (she kept running into things on her left side).  Some mild DOE at the end of our long walk 2/4.           Balance Overall balance assessment: Needs assistance Sitting-balance support: Feet supported;No upper extremity supported Sitting balance-Leahy Scale: Good     Standing balance support: Bilateral  upper extremity supported;No upper extremity supported;Single extremity supported Standing balance-Leahy Scale: Fair                      Cognition Arousal/Alertness: Awake/alert Behavior During Therapy: WFL for tasks assessed/performed Overall Cognitive Status: Within Functional Limits for tasks assessed                      Exercises Total Joint Exercises Hip ABduction/ADduction: AROM;Right;10 reps;Standing Knee Flexion: AROM;Right;10 reps;Standing Marching in Standing: AROM;Right;10 reps;Standing Standing Hip Extension: AROM;Right;10 reps;Standing Other Exercises Other Exercises: Bil heel raises x10 reps standing holding to RW.         Pertinent Vitals/Pain Pain Assessment: No/denies pain           PT Goals (current goals can now be found in the care plan section) Acute Rehab PT Goals Patient Stated Goal: go to [facility] Progress towards PT goals: Progressing toward goals    Frequency  7X/week    PT Plan Current plan remains appropriate       End of Session   Activity Tolerance: Patient tolerated treatment well Patient left: in chair;with call bell/phone within reach;with family/visitor present     Time: 1143-1200 PT Time Calculation (min) (ACUTE ONLY): 17 min  Charges:  $Gait Training: 8-22 mins                      Mary Merritt B. Jilliam Bellmore, PT, DPT [phone]   08/03/2014, 1:25 PM

## 2014-08-03 NOTE — Discharge Summary (Signed)
Physician Discharge Summary      Patient ID: Mary Merritt MRN: [id number] DOB/AGE: 24-Jan-1928 81 y.o.  Admit date: 08/01/2014 Discharge date: 08/03/2014  Admission Diagnoses:  Osteoarthritis of right hip  Discharge Diagnoses:  Principal Problem:   Osteoarthritis of right hip Active Problems:   Hip arthritis   Past Medical History  Diagnosis Date  . Hypertension   . Hyperlipidemia   . Dysrhythmia     bradycardia  . Anxiety   . Arthritis     Surgeries: Procedure(s): RIGHT TOTAL HIP ARTHROPLASTY ANTERIOR APPROACH on 08/01/2014   Consultants (if any):    Discharged Condition: Improved  Hospital Course: Mary Merritt is an 81 y.o. female who was admitted 08/01/2014 with a diagnosis of Osteoarthritis of right hip and went to the operating room on 08/01/2014 and underwent the above named procedures.    She was given perioperative antibiotics:      Anti-infectives    Start     Dose/Rate Route Frequency Ordered Stop   08/01/14 1800  ceFAZolin (ANCEF) IVPB 2 g/50 mL premix     2 g 100 mL/hr over 30 Minutes Intravenous Every 6 hours 08/01/14 1703 08/01/14 2338   08/01/14 0600  ceFAZolin (ANCEF) IVPB 2 g/50 mL premix     2 g 100 mL/hr over 30 Minutes Intravenous On call to O.R. 07/31/14 1406 08/01/14 1230    .  She was given sequential compression devices, early ambulation, and aspirin for DVT prophylaxis.  She benefited maximally from the hospital stay and there were no complications.    Recent vital signs:  Filed Vitals:   08/03/14 0600  BP: 108/56  Pulse: 93  Temp: 98.6 F (37 C)  Resp: 18    Recent laboratory studies:  Lab Results  Component Value Date   HGB 10.7* 08/03/2014   HGB 11.0* 08/02/2014   HGB 14.3 07/21/2014   Lab Results  Component Value Date   WBC 8.6 08/03/2014   PLT 189 08/03/2014   Lab Results  Component Value Date   INR 0.99 07/21/2014   Lab Results  Component Value Date   NA 135 08/02/2014   K 4.1 08/02/2014   CL 102 08/02/2014     CO2 29 08/02/2014   BUN 12 08/02/2014   CREATININE 0.91 08/02/2014   GLUCOSE 94 08/02/2014    Discharge Medications:     Medication List    TAKE these medications        amLODipine 10 MG tablet  Commonly known as:  NORVASC  Take 1 tablet (10 mg total) by mouth daily.     aspirin 81 MG tablet  Take 81 mg by mouth daily.     fexofenadine 30 MG tablet  Commonly known as:  ALLEGRA  Take 30 mg by mouth daily.     Fish Oil 1000 MG Caps  Take 1,000 mg by mouth 2 (two) times daily.     hydrochlorothiazide 25 MG tablet  Commonly known as:  HYDRODIURIL  Take 25 mg by mouth daily.     lovastatin 20 MG tablet  Commonly known as:  MEVACOR  Take 20 mg by mouth daily.     metoprolol tartrate 25 MG tablet  Commonly known as:  LOPRESSOR  Take 25 mg by mouth 2 (two) times daily.        Diagnostic Studies: Dg Pelvis Portable  08/01/2014   CLINICAL DATA:  Postop from right hip arthroplasty.  EXAM: PORTABLE PELVIS 1-2 VIEWS  COMPARISON:  None.  FINDINGS:  Bipolar right hip prosthesis is seen in appropriate position. No evidence of fracture or dislocation. Old right pubic rami fractures noted as well as peripheral vascular calcification. Advanced lower lumbar spine degenerative changes also seen.  IMPRESSION: Expected postoperative appearance of right hip prosthesis. No evidence of fracture or dislocation.   Electronically Signed   By: Earle Gell M.D.   On: 08/01/2014 16:22   Dg Hip Operative Unilat With Pelvis Right  08/01/2014   CLINICAL DATA:  Total hip arthroplasty.  EXAM: OPERATIVE right HIP (WITH PELVIS IF PERFORMED)  VIEWS  TECHNIQUE: Fluoroscopic spot image(s) were submitted for interpretation post-operatively.  COMPARISON:  None.  FINDINGS: Total right hip arthroplasty appears well seated without complicating features.  IMPRESSION: Well seated components of a total right hip arthroplasty without complicating features.   Electronically Signed   By: Kalman Jewels M.D.   On:  08/01/2014 14:52    Disposition:   1. Remove dressing on 08/08/14.  2. Begin betadine paints to incision twice a day with dry dressing. 3. May get incision wet while showering after 10 days.  Do not submerge incision until notified. 4. WBAT 5. Ice the operative 20 minutes on and 20 minutes off around the clock.  6. Avoid any dental visits including dental cleanings for 6 months after surgery.  Discharge Instructions    Call MD / Call 911    Complete by:  As directed   If you experience chest pain or shortness of breath, CALL 911 and be transported to the hospital emergency room.  If you develope a fever above 101.5 F, pus (white drainage) or increased drainage or redness at the wound, or calf pain, call your surgeon's office.     Constipation Prevention    Complete by:  As directed   Drink plenty of fluids.  Prune juice may be helpful.  You may use a stool softener, such as Colace (over the counter) 100 mg twice a day.  Use MiraLax (over the counter) for constipation as needed.     Diet - low sodium heart healthy    Complete by:  As directed      Diet general    Complete by:  As directed      Driving restrictions    Complete by:  As directed   No driving while taking narcotic pain meds.     Follow the hip precautions as taught in Physical Therapy    Complete by:  As directed      Increase activity slowly as tolerated    Complete by:  As directed      TED hose    Complete by:  As directed   Use stockings (TED hose) for 6 weeks on both leg(s).  You may remove them at night for sleeping.           Follow-up Information    Follow up with Marianna Payment, MD In 2 weeks.   Specialty:  Orthopedic Surgery   Why:  For suture removal, For wound re-check   Contact information:   [address] [place] [address] [phone]        Signed: Marianna Payment 08/03/2014, 7:43 AM

## 2014-08-03 NOTE — Discharge Planning (Signed)
Patient will discharge today per MD order. Patient will discharge to [facility] RN to call report prior to transportation to [phone] Transportation: daughter, [name] (SNF ready for patient after 1:30pm)  CSW sent discharge summary to SNF for review.  Packet is complete.  RN, patient and family aware of discharge plans.  [name], [place] [phone]  Psychiatric & Orthopedics (5N 1-16) Clinical Social Worker

## 2014-08-03 NOTE — Clinical Social Work Placement (Signed)
Clinical Social Work Department CLINICAL SOCIAL WORK PLACEMENT NOTE 08/03/2014  Patient:  Mary Merritt, Mary Merritt  Account Number:  [id number] Admit date:  08/01/2014  Clinical Social Worker:  Wylene Men  Date/time:  08/03/2014 11:08 AM  Clinical Social Work is seeking post-discharge placement for this patient at the following level of care:   SKILLED NURSING   (*CSW will update this form in Epic as items are completed)   08/03/2014  Patient/family provided with [facility] Department of Clinical Social Work's list of facilities offering this level of care within the geographic area requested by the patient (or if unable, by the patient's family).  08/03/2014  Patient/family informed of their freedom to choose among providers that offer the needed level of care, that participate in Medicare, Medicaid or managed care program needed by the patient, have an available bed and are willing to accept the patient.  08/03/2014  Patient/family informed of [facility]' ownership interest in [facility], as well as of the fact that they are under no obligation to receive care at this facility.  PASARR submitted to EDS on 08/03/2014 PASARR number received on 08/03/2014  FL2 transmitted to all facilities in geographic area requested by pt/family on  08/03/2014 FL2 transmitted to all facilities within larger geographic area on   Patient informed that his/her managed care company has contracts with or will negotiate with  certain facilities, including the following:     Patient/family informed of bed offers received:  08/03/2014 Patient chooses bed at [facility] [place] Physician recommends and patient chooses bed at    Patient to be transferred to [place] on  08/03/2014 Patient to be transferred to facility by daughter, [name] Patient and family notified of transfer on 08/03/2014 Name of family member notified:  patient is alert and  oriented and will relay dc info to dtr  The following physician request were entered in Epic:   Additional Comments:   [name], [place] [phone]  Psychiatric & Orthopedics (5N 1-16) Clinical Social Worker

## 2014-08-25 NOTE — Addendum Note (Signed)
Addendum  created 08/25/14 2162 by Albertha Ghee, MD   Modules edited: Anesthesia Attestations

## 2015-04-13 ENCOUNTER — Other Ambulatory Visit: Payer: Self-pay | Admitting: Internal Medicine

## 2015-04-13 NOTE — Telephone Encounter (Signed)
Rx request sent to pharmacy.  

## 2015-07-03 ENCOUNTER — Ambulatory Visit: Payer: Medicare Other | Admitting: Internal Medicine

## 2015-07-17 ENCOUNTER — Other Ambulatory Visit: Payer: Self-pay | Admitting: *Deleted

## 2015-07-27 ENCOUNTER — Encounter: Payer: Self-pay | Admitting: Internal Medicine

## 2015-07-27 ENCOUNTER — Ambulatory Visit (INDEPENDENT_AMBULATORY_CARE_PROVIDER_SITE_OTHER): Payer: Medicare Other | Admitting: Internal Medicine

## 2015-07-27 VITALS — BP 152/80 | HR 48 | Ht 65.0 in | Wt 170.7 lb

## 2015-07-27 DIAGNOSIS — E785 Hyperlipidemia, unspecified: Secondary | ICD-10-CM

## 2015-07-27 DIAGNOSIS — R011 Cardiac murmur, unspecified: Secondary | ICD-10-CM

## 2015-07-27 DIAGNOSIS — Z96649 Presence of unspecified artificial hip joint: Secondary | ICD-10-CM

## 2015-07-27 DIAGNOSIS — Z9889 Other specified postprocedural states: Secondary | ICD-10-CM

## 2015-07-27 DIAGNOSIS — I10 Essential (primary) hypertension: Secondary | ICD-10-CM

## 2015-07-27 DIAGNOSIS — Z8679 Personal history of other diseases of the circulatory system: Secondary | ICD-10-CM

## 2015-07-27 DIAGNOSIS — Z9660 Presence of unspecified orthopedic joint implant: Secondary | ICD-10-CM

## 2015-07-27 MED ORDER — AMLODIPINE BESYLATE 5 MG PO TABS: 5.0000 mg | ORAL_TABLET | Freq: Every day | ORAL | Status: DC

## 2015-07-27 MED ORDER — CHLORTHALIDONE 25 MG PO TABS: 25.0000 mg | ORAL_TABLET | Freq: Every day | ORAL | Status: DC

## 2015-07-27 NOTE — Patient Instructions (Signed)
Dr Debara Pickett has recommended making the following medication changes: STOP Hydroclorothiazide DECREASE Amlodipine (Norvasc) to 5 mg START Chlorthalidone 25 mg - take 1 tablet (25 mg total) by mouth daily  Your physician recommends that you schedule a follow-up appointment in 2-3 weeks with our pharmacist, [name], for blood pressure check.  Dr Debara Pickett recommends that you schedule a follow-up appointment in 1 year. You will receive a reminder letter in the mail two months in advance. If you don't receive a letter, please call our office to schedule the follow-up appointment.

## 2015-07-27 NOTE — Progress Notes (Signed)
OFFICE NOTE  Chief Complaint:  Leg swelling  Primary Care Physician: Suzanna Obey, MD  HPI:  Mary Merritt is an 82 year old female  Kindly referred to me by Dr. Thea Silversmith. She is recently been having hip problems and is in need of hip replacement. Her past medical history significant for hypertension which is been difficult to control, dyslipidemia and  AAA status post repair by Dr. Scot Dock in 2009.  Recently during preoperative evaluation she was found to be bradycardic with a heart rate in the 40s. It is notable that she is on a beta blocker. She's also had difficult to control hypertension. She has not had follow-up with the vascular surgeons for her abdominal aortic aneurysm. She reportedly had stress testing prior to vascular surgery. She is not complaining of any angina but does get some shortness of breath. She is not particularly active due to her hip problem.   Mary Merritt returns today for follow-up. She underwent a nuclear stress test and echocardiogram which showed no significant abnormalities. I've adjusted her medications and reduce her beta blocker, but heart rate still remains in the 60s. She reports a family history of low heart rate. She will need to continue to monitor for any progressive bradycardia.  I saw Mary Merritt back today in the office. Overall she's doing fairly well. She reports some bilateral leg swelling mostly at the ankles. She said this is worse since I increased her Norvasc from 5-10 mg. Pressure still runs a little high. She did undergo successful hip replacement surgery and is walking better. Heart rate is low today at 48 but she is asymptomatic with this. Heart rate is been in the 40s to 50s before. We discussed possibly decreasing her Lopressor however blood pressure remains high.  PMHx:  Past Medical History  Diagnosis Date  . Hypertension   . Hyperlipidemia   . Dysrhythmia     bradycardia  . Anxiety   . Arthritis     Past Surgical History    Procedure Laterality Date  . Abdominal aortic aneurysm repair      2009, Dr. Scot Dock  . Total hip arthroplasty Right 08/01/2014    Procedure: RIGHT TOTAL HIP ARTHROPLASTY ANTERIOR APPROACH;  Surgeon: Marianna Payment, MD;  Location: [place];  Service: Orthopedics;  Laterality: Right;    FAMHx:  Family History  Problem Relation Age of Onset  . Heart failure Father   . Stroke Mother     SOCHx:   reports that she quit smoking about 31 years ago. She has never used smokeless tobacco. She reports that she does not drink alcohol or use illicit drugs.  ALLERGIES:  Allergies  Allergen Reactions  . Percocet [Oxycodone-Acetaminophen] Other (See Comments)    Makes head feel funny    ROS: A comprehensive review of systems was negative except for: Cardiovascular: positive for lower extremity edema and bradycardia  HOME MEDS: Current Outpatient Prescriptions  Medication Sig Dispense Refill  . amLODipine (NORVASC) 5 MG tablet Take 1 tablet (5 mg total) by mouth daily. 30 tablet 11  . aspirin 81 MG tablet Take 81 mg by mouth daily.    . fexofenadine (ALLEGRA) 30 MG tablet Take 30 mg by mouth daily.    Marland Kitchen lovastatin (MEVACOR) 20 MG tablet Take 20 mg by mouth daily.    . metoprolol tartrate (LOPRESSOR) 25 MG tablet Take 25 mg by mouth 2 (two) times daily.    . Omega-3 Fatty Acids (FISH OIL) 1000 MG CAPS Take 1,000 mg  by mouth 2 (two) times daily.    . chlorthalidone (HYGROTON) 25 MG tablet Take 1 tablet (25 mg total) by mouth daily. 30 tablet 11   No current facility-administered medications for this visit.    LABS/IMAGING: No results found for this or any previous visit (from the past 48 hour(s)). No results found.  VITALS: BP 152/80 mmHg  Pulse 48  Ht '5\' 5"'$  (1.651 m)  Wt 170 lb 11.2 oz (77.429 kg)  BMI 28.41 kg/m2  EXAM: General appearance: alert and no distress Neck: no carotid bruit, no JVD and thyroid not enlarged, symmetric, no tenderness/mass/nodules Lungs: clear to  auscultation bilaterally Heart: regular rate and rhythm, S1, S2 normal, no murmur, click, rub or gallop Abdomen: soft, non-tender; bowel sounds normal; no masses,  no organomegaly Extremities: edema Trace to 1+ bilateral ankle edema Pulses: 2+ and symmetric Skin: Skin color, texture, turgor normal. No rashes or lesions Neurologic: Grossly normal Psych: Pleasant  EKG: Sinus bradycardia at 40  ASSESSMENT: 1. Asymptomatic bradycardia 2.  history of AAA status post repair in 2009 3.  hypertension-poorly controlled 4.  dyslipidemia 5.  murmur 6. Leg edema  PLAN: 1.    Mary Merritt underwent successful hip replacement surgery. She's now struggling with some leg swelling which may be related to amlodipine. I will decrease that back to 5 mg daily. In addition will change her hydrochlorothiazide over to chlorthalidone 25 mg daily. We may need to decrease her metoprolol if her heart rate remains low, especially below 50. I like to have close follow-up with [name] our pharmacist in 2-3 weeks who may further adjust her blood pressure medications. Systolic blood pressure at home is been running between 140 and 160.  Pixie Casino, MD, [facility] Attending Cardiologist [place] 07/27/2015, 1:28 PM

## 2015-08-17 ENCOUNTER — Ambulatory Visit (INDEPENDENT_AMBULATORY_CARE_PROVIDER_SITE_OTHER): Payer: Medicare Other | Admitting: Pharmacist Clinician (PhC)/ Clinical Pharmacy Specialist

## 2015-08-17 VITALS — BP 184/86 | HR 52 | Ht 65.0 in | Wt 170.6 lb

## 2015-08-17 DIAGNOSIS — I10 Essential (primary) hypertension: Secondary | ICD-10-CM | POA: Diagnosis not present

## 2015-08-17 NOTE — Patient Instructions (Signed)
Return for a a follow up appointment in 1 month  Your blood pressure today is 184/86  (goal is < 150/90)  Check your blood pressure at home several days each week and keep record of the readings.  Take your BP meds as follows: switch back to hydrochlorothiazide 25 mg each day;  All other medications are the same  Bring all of your meds, your BP cuff and your record of home blood pressures to your next appointment.  Exercise as you're able, try to walk approximately 30 minutes per day.  Keep salt intake to a minimum, especially watch canned and prepared boxed foods.  Eat more fresh fruits and vegetables and fewer canned items.  Avoid eating in fast food restaurants.    HOW TO TAKE YOUR BLOOD PRESSURE: . Rest 5 minutes before taking your blood pressure. .  Don't smoke or drink caffeinated beverages for at least 30 minutes before. . Take your blood pressure before (not after) you eat. . Sit comfortably with your back supported and both feet on the floor (don't cross your legs). . Elevate your arm to heart level on a table or a desk. . Use the proper sized cuff. It should fit smoothly and snugly around your bare upper arm. There should be enough room to slip a fingertip under the cuff. The bottom edge of the cuff should be 1 inch above the crease of the elbow. . Ideally, take 3 measurements at one sitting and record the average.

## 2015-08-19 ENCOUNTER — Encounter: Payer: Self-pay | Admitting: Pharmacist Clinician (PhC)/ Clinical Pharmacy Specialist

## 2015-08-19 NOTE — Assessment & Plan Note (Signed)
Ms. Dittrich has an elevated pressure in the office despite normal home readings.  She would like to stop the chlorthalidone and go back on the hydrochlorothiazide.  I don't have a problem with that, so she will make the switch.  I am going to see her again in a month, at which time she will bring her home BP cuff, as well as a log of her readings.  At that time we can determine the accuracy of her home readings and better determine if she needs more blood pressure control.  Her most recent labs (earlier this month after switching to chlorthalidone) showed normal potassium and kidney function.

## 2015-08-19 NOTE — Progress Notes (Signed)
     08/19/2015 Mary Merritt 1927-09-12 [id number]   HPI:  Mary Merritt is a 82 y.o. female patient of Dr Debara Pickett, with a PMH below who presents today for hypertension clinic evaluation.  When she saw Dr. Debara Pickett her BP was 152/80 and he switched her from HCTZ to chlorthalidone.  Since switching she has noted increase cramping in her legs and hands.  She has had hypertension since she was in her mid-60's, and a hip replacement last year  Cardiac Hx: HTN, hyperlipidemia, AAA repair in 2009,   Family Hx: mother with HTN, died from stroke at 48, father died at 47 with CHF  Social Hx: no tobacco or alcohol, drinks 1 cup of coffee each monring  Diet: does cook with salt, but adds no extra at the table  Exercise: no formal exercise, does keep her home and yard up on her own  Home BP readings: mostly 650'P systolic, reports no readings > 160.  Diastolic readings all WNL  Current antihypertensive medications: chlorthalidone 25, amlodipine 5 mg, metoprolol 25 mg bid  Intolerances: amlodipine 10 mg caused lower extremity edema  Wt Readings from Last 3 Encounters:  08/19/15 170 lb 9.6 oz (77.384 kg)  07/27/15 170 lb 11.2 oz (77.429 kg)  08/01/14 169 lb 9 oz (76.913 kg)   BP Readings from Last 3 Encounters:  08/19/15 184/86  07/27/15 152/80  08/03/14 108/56   Pulse Readings from Last 3 Encounters:  08/19/15 52  07/27/15 48  08/03/14 93    Current Outpatient Prescriptions  Medication Sig Dispense Refill  . amLODipine (NORVASC) 5 MG tablet Take 1 tablet (5 mg total) by mouth daily. 30 tablet 11  . aspirin 81 MG tablet Take 81 mg by mouth daily.    . fexofenadine (ALLEGRA) 30 MG tablet Take 30 mg by mouth daily.    . hydrochlorothiazide (HYDRODIURIL) 25 MG tablet Take 25 mg by mouth daily.    Marland Kitchen lovastatin (MEVACOR) 20 MG tablet Take 20 mg by mouth daily.    . metoprolol tartrate (LOPRESSOR) 25 MG tablet Take 25 mg by mouth 2 (two) times daily.    . Omega-3 Fatty Acids (FISH OIL) 1000 MG  CAPS Take 1,000 mg by mouth 2 (two) times daily.     No current facility-administered medications for this visit.    Allergies  Allergen Reactions  . Percocet [Oxycodone-Acetaminophen] Other (See Comments)    Makes head feel funny    Past Medical History  Diagnosis Date  . Hypertension   . Hyperlipidemia   . Dysrhythmia     bradycardia  . Anxiety   . Arthritis     Blood pressure 184/86, pulse 52, height '5\' 5"'$  (1.651 m), weight 170 lb 9.6 oz (77.384 kg).    Tommy Medal PharmD CPP [facility]

## 2015-09-21 ENCOUNTER — Other Ambulatory Visit: Payer: Self-pay | Admitting: *Deleted

## 2015-09-21 ENCOUNTER — Ambulatory Visit (INDEPENDENT_AMBULATORY_CARE_PROVIDER_SITE_OTHER): Payer: Medicare Other | Admitting: Pharmacist Clinician (PhC)/ Clinical Pharmacy Specialist

## 2015-09-21 VITALS — BP 162/74 | HR 60 | Ht 65.0 in | Wt 169.8 lb

## 2015-09-21 DIAGNOSIS — I10 Essential (primary) hypertension: Secondary | ICD-10-CM

## 2015-09-21 MED ORDER — METOPROLOL TARTRATE 25 MG PO TABS: 25.0000 mg | ORAL_TABLET | Freq: Two times a day (BID) | ORAL | Status: DC

## 2015-09-21 NOTE — Patient Instructions (Signed)
Return for a a follow up appointment in   Your blood pressure today is 162/74   Check your blood pressure at home several times each week and keep record of the readings.  Take your BP meds as follows: continue with your current medications  Bring all of your meds, your BP cuff and your record of home blood pressures to your next appointment.  Exercise as you're able, try to walk approximately 30 minutes per day.  Keep salt intake to a minimum, especially watch canned and prepared boxed foods.  Eat more fresh fruits and vegetables and fewer canned items.  Avoid eating in fast food restaurants.    HOW TO TAKE YOUR BLOOD PRESSURE: . Rest 5 minutes before taking your blood pressure. .  Don't smoke or drink caffeinated beverages for at least 30 minutes before. . Take your blood pressure before (not after) you eat. . Sit comfortably with your back supported and both feet on the floor (don't cross your legs). . Elevate your arm to heart level on a table or a desk. . Use the proper sized cuff. It should fit smoothly and snugly around your bare upper arm. There should be enough room to slip a fingertip under the cuff. The bottom edge of the cuff should be 1 inch above the crease of the elbow. . Ideally, take 3 measurements at one sitting and record the average.

## 2015-09-22 ENCOUNTER — Encounter: Payer: Self-pay | Admitting: Pharmacist Clinician (PhC)/ Clinical Pharmacy Specialist

## 2015-09-22 NOTE — Assessment & Plan Note (Signed)
Blood pressure elevated in the office today, however home readings all looking good.  Her home cuff actually read close to 15 points higher than the office reading, so I suspect that her home BP average is slightly lower than the 148 recorded.  She is doing well on her current regimen and I see no need to add or adjust her current medications.  I have asked that she continue with home monitoring several days per week and call should she note consistent readings > 150.

## 2015-09-22 NOTE — Progress Notes (Signed)
     09/22/2015 Mary Merritt 09-Jan-1928 [id number]   HPI:  Mary Merritt is a 82 y.o. female patient of Dr Debara Pickett, with a PMH below who presents today for hypertension clinic follow up.  Dr. Debara Pickett saw her in early February and switched her HCTZ to chlorthalidone.  However she noted more cramping in her hands and legs, and switched back to hctz.  She has had hypertension since she was in her mid-60's, and a hip replacement last year.  At her last visit we did not make any changes, as we were unable to validate any of her home readings, although they were mostly listed as WNL.  Today she comes in with her home cuff, a [employer] wrist monitor.    Cardiac Hx: HTN, hyperlipidemia, AAA repair in 2009,   Family Hx: mother with HTN, died from stroke at 49, father died at 49 with CHF  Social Hx: no tobacco or alcohol, drinks 1 cup of coffee each monring  Diet: does cook with salt, but adds no extra at the table  Exercise: no formal exercise, does keep her home and yard up on her own  Home BP readings: home systolic readings average 148, with high of 160 and only 4/16 readings above 150.  Lowest 139.  Diastolic readings all WNL  Current antihypertensive medications: hctz 15 mg, amlodipine 5 mg, metoprolol 25 mg bid  Intolerances: amlodipine 10 mg caused lower extremity edema  Wt Readings from Last 3 Encounters:  09/22/15 169 lb 12.8 oz (77.021 kg)  08/19/15 170 lb 9.6 oz (77.384 kg)  07/27/15 170 lb 11.2 oz (77.429 kg)   BP Readings from Last 3 Encounters:  09/22/15 162/74  08/19/15 184/86  07/27/15 152/80   Pulse Readings from Last 3 Encounters:  09/22/15 60  08/19/15 52  07/27/15 48    Current Outpatient Prescriptions  Medication Sig Dispense Refill  . amLODipine (NORVASC) 5 MG tablet Take 1 tablet (5 mg total) by mouth daily. 30 tablet 11  . aspirin 81 MG tablet Take 81 mg by mouth daily.    . fexofenadine (ALLEGRA) 30 MG tablet Take 30 mg by mouth daily.    . hydrochlorothiazide  (HYDRODIURIL) 25 MG tablet Take 25 mg by mouth daily.    Marland Kitchen lovastatin (MEVACOR) 20 MG tablet Take 20 mg by mouth daily.    . metoprolol tartrate (LOPRESSOR) 25 MG tablet Take 1 tablet (25 mg total) by mouth 2 (two) times daily. 180 tablet 2  . Omega-3 Fatty Acids (FISH OIL) 1000 MG CAPS Take 1,000 mg by mouth 2 (two) times daily.     No current facility-administered medications for this visit.    Allergies  Allergen Reactions  . Percocet [Oxycodone-Acetaminophen] Other (See Comments)    Makes head feel funny    Past Medical History  Diagnosis Date  . Hypertension   . Hyperlipidemia   . Dysrhythmia     bradycardia  . Anxiety   . Arthritis     Blood pressure 162/74, pulse 60, height '5\' 5"'$  (1.651 m), weight 169 lb 12.8 oz (77.021 kg).    Tommy Medal PharmD CPP [facility]

## 2016-02-16 ENCOUNTER — Telehealth: Payer: Self-pay | Admitting: Internal Medicine

## 2016-02-16 NOTE — Telephone Encounter (Signed)
[facility] requests clearance for:   1. Type of surgery: left total hip arthroplasty  2. Date of surgery: TBD 3. Surgeon: Eduard Roux, MD 4. Medications that need to be held & how long: none specified  5. Fax and/or Phone: (p) [phone]  (f) [phone] - Attn: Dondra Spry

## 2016-02-19 NOTE — Telephone Encounter (Signed)
Low risk for hip surgery - recently cleared and successfully had the other hip replaced.  Dr. Lemmie Evens

## 2016-02-19 NOTE — Telephone Encounter (Signed)
Clearance routed via EPIC 

## 2016-03-13 ENCOUNTER — Emergency Department (HOSPITAL_COMMUNITY)
Admission: EM | Admit: 2016-03-13 | Discharge: 2016-03-13 | Disposition: A | Discharge status: Home / Self Care | Payer: Medicare Other | Attending: Emergency Medicine | Admitting: Emergency Medicine

## 2016-03-13 ENCOUNTER — Other Ambulatory Visit: Payer: Self-pay | Admitting: Orthopaedic Surgery

## 2016-03-13 ENCOUNTER — Emergency Department (HOSPITAL_COMMUNITY): Payer: Medicare Other

## 2016-03-13 ENCOUNTER — Encounter (HOSPITAL_COMMUNITY): Payer: Self-pay

## 2016-03-13 DIAGNOSIS — Z87891 Personal history of nicotine dependence: Secondary | ICD-10-CM | POA: Insufficient documentation

## 2016-03-13 DIAGNOSIS — Z7982 Long term (current) use of aspirin: Secondary | ICD-10-CM | POA: Insufficient documentation

## 2016-03-13 DIAGNOSIS — Z96641 Presence of right artificial hip joint: Secondary | ICD-10-CM | POA: Diagnosis not present

## 2016-03-13 DIAGNOSIS — Z79899 Other long term (current) drug therapy: Secondary | ICD-10-CM | POA: Diagnosis not present

## 2016-03-13 DIAGNOSIS — R000 Tachycardia, unspecified: Secondary | ICD-10-CM | POA: Diagnosis present

## 2016-03-13 DIAGNOSIS — I10 Essential (primary) hypertension: Secondary | ICD-10-CM | POA: Insufficient documentation

## 2016-03-13 LAB — I-STAT TROPONIN, ED: TROPONIN I, POC: 0 ng/mL (ref 0.00–0.08)

## 2016-03-13 LAB — CBC
HCT: 44.6 % (ref 36.0–46.0)
Hemoglobin: 15.4 g/dL — ABNORMAL HIGH (ref 12.0–15.0)
MCH: 30.6 pg (ref 26.0–34.0)
MCHC: 34.5 g/dL (ref 30.0–36.0)
MCV: 88.7 fL (ref 78.0–100.0)
Platelets: 266 10*3/uL (ref 150–400)
RBC: 5.03 MIL/uL (ref 3.87–5.11)
RDW: 13.2 % (ref 11.5–15.5)
WBC: 10.3 10*3/uL (ref 4.0–10.5)

## 2016-03-13 LAB — BASIC METABOLIC PANEL
ANION GAP: 12 (ref 5–15)
BUN: 21 mg/dL — AB (ref 6–20)
CALCIUM: 9.4 mg/dL (ref 8.9–10.3)
CO2: 22 mmol/L (ref 22–32)
Chloride: 101 mmol/L (ref 101–111)
Creatinine, Ser: 1.23 mg/dL — ABNORMAL HIGH (ref 0.44–1.00)
GFR calc Af Amer: 45 mL/min — ABNORMAL LOW (ref >60)
GFR, EST NON AFRICAN AMERICAN: 39 mL/min — AB (ref >60)
GLUCOSE: 154 mg/dL — AB (ref 65–99)
POTASSIUM: 3.7 mmol/L (ref 3.5–5.1)
SODIUM: 135 mmol/L (ref 135–145)

## 2016-03-13 NOTE — ED Notes (Signed)
Patient transported to X-ray 

## 2016-03-13 NOTE — ED Provider Notes (Signed)
[facility] DEPT Provider Note   CSN: [id number] Arrival date & time: 03/13/16  1703     History   Chief Complaint Chief Complaint  Patient presents with  . Tachycardia    HPI NYSHA KOPLIN is a 82 y.o. female.  HPI  Pt presenting with c/o not feeling well.   She states she took her HR at home and it was 116 on her blood pressure machine.  She denies feeling palpitations.  No chest pain. No lightheadedness or syncope.  She states she felt more tired than usual today.  No nausea, no leg swelling.  There are no other associated systemic symptoms, there are no other alleviating or modifying factors.   Past Medical History:  Diagnosis Date  . Anxiety   . Arthritis   . Dysrhythmia    bradycardia  . Hyperlipidemia   . Hypertension     Patient Active Problem List   Diagnosis Date Noted  . S/P hip replacement 07/27/2015  . Osteoarthritis of right hip 08/01/2014  . Essential hypertension 05/31/2014  . Hyperlipidemia 05/31/2014  . S/P AAA repair 05/31/2014  . Bradycardia 05/31/2014  . Murmur 05/31/2014    Past Surgical History:  Procedure Laterality Date  . ABDOMINAL AORTIC ANEURYSM REPAIR     2009, Dr. Scot Dock  . TOTAL HIP ARTHROPLASTY Right 08/01/2014   Procedure: RIGHT TOTAL HIP ARTHROPLASTY ANTERIOR APPROACH;  Surgeon: Marianna Payment, MD;  Location: [facility];  Service: Orthopedics;  Laterality: Right;    OB History    No data available       Home Medications    Prior to Admission medications   Medication Sig Start Date End Date Taking? Authorizing Provider  amLODipine (NORVASC) 5 MG tablet Take 1 tablet (5 mg total) by mouth daily. 07/27/15  Yes Pixie Casino, MD  aspirin 81 MG tablet Take 81 mg by mouth daily.   Yes Historical Provider, MD  cetirizine (ZYRTEC) 10 MG tablet Take 10 mg by mouth daily.   Yes Historical Provider, MD  hydrochlorothiazide (HYDRODIURIL) 25 MG tablet Take 25 mg by mouth daily. 07/15/15  Yes Historical Provider, MD  lovastatin  (MEVACOR) 20 MG tablet Take 20 mg by mouth daily.   Yes Historical Provider, MD  metoprolol tartrate (LOPRESSOR) 25 MG tablet Take 1 tablet (25 mg total) by mouth 2 (two) times daily. 09/21/15  Yes Pixie Casino, MD  Omega-3 Fatty Acids (FISH OIL) 1000 MG CAPS Take 1,000 mg by mouth daily.    Yes Historical Provider, MD    Family History Family History  Problem Relation Age of Onset  . Heart failure Father   . Stroke Mother     Social History Social History  Substance Use Topics  . Smoking status: Former Smoker    Years: 30.00    Quit date: 05/31/1984  . Smokeless tobacco: Never Used  . Alcohol use No     Allergies   Percocet [oxycodone-acetaminophen]   Review of Systems Review of Systems  ROS reviewed and all otherwise negative except for mentioned in HPI   Physical Exam Updated Vital Signs BP 177/68 (BP Location: Right Arm)   Pulse 71   Temp 97.9 F (36.6 C) (Oral)   Resp 16   Ht '5\' 5"'$  (1.651 m)   Wt 77.1 kg   SpO2 94%   BMI 28.29 kg/m  Vitals reviewed Physical Exam Physical Examination: General appearance - alert, well appearing, and in no distress Mental status - alert, oriented to person, place, and  time Eyes - no conjunctival injection, no scleral icterus Mouth - mucous membranes moist, pharynx normal without lesions Chest - clear to auscultation, no wheezes, rales or rhonchi, symmetric air entry Heart - normal rate, regular rhythm, normal S1, S2, no murmurs, rubs, clicks or gallops Abdomen - soft, nontender, nondistended, no masses or organomegaly Neurological - alert, oriented, normal speech Extremities - peripheral pulses normal, no pedal edema, no clubbing or cyanosis Skin - normal coloration and turgor, no rashes  ED Treatments / Results  Labs (all labs ordered are listed, but only abnormal results are displayed) Labs Reviewed  BASIC METABOLIC PANEL - Abnormal; Notable for the following:       Result Value   Glucose, Bld 154 (*)    BUN 21 (*)     Creatinine, Ser 1.23 (*)    GFR calc non Af Amer 39 (*)    GFR calc Af Amer 45 (*)    All other components within normal limits  CBC - Abnormal; Notable for the following:    Hemoglobin 15.4 (*)    All other components within normal limits  I-STAT TROPOININ, ED    EKG  EKG Interpretation  Date/Time:  Wednesday March 13 2016 17:08:53 EDT Ventricular Rate:  88 PR Interval:  230 QRS Duration: 80 QT Interval:  350 QTC Calculation: 423 R Axis:   -8 Text Interpretation:  Sinus rhythm with 1st degree A-V block with Premature atrial complexes Low voltage QRS Inferior infarct , age undetermined Cannot rule out Anterior infarct , age undetermined Abnormal ECG Since previous tracing irregular rhythm is new and rate faster Confirmed by Canary Brim  MD, Damoney Julia [phone]) on 03/13/2016 5:23:05 PM       Radiology Dg Chest 2 View  Result Date: 03/13/2016 CLINICAL DATA:  Chest pain for 1 day. EXAM: CHEST  2 VIEW COMPARISON:  06/03/2014 FINDINGS: The cardiac silhouette, mediastinal and hilar contours are within normal limits and stable. There is moderate tortuosity and calcification of the thoracic aorta. The lungs are clear. No pleural effusion. The bony thorax is intact. Remote healed rib fractures are again demonstrated. IMPRESSION: No acute cardiopulmonary findings. Electronically Signed   By: Marijo Sanes M.D.   On: 03/13/2016 19:10    Procedures Procedures (including critical care time)  Medications Ordered in ED Medications - No data to display   Initial Impression / Assessment and Plan / ED Course  I have reviewed the triage vital signs and the nursing notes.  Pertinent labs & imaging results that were available during my care of the patient were reviewed by me and considered in my medical decision making (see chart for details).  Clinical Course   7:25 PM d/w Dr. Meda Coffee, cardiology, she will look at EKGs and let me know what intervention is neeed if any   pt presenting with c/o  elevated heart rate. Initial HR taken on monitor in ED at triage showed HR 160, however it is likely the monitor was picking up p waves.  She has remained in 70s in the ED.  EKG shows PACs.  D/w Dr. Meda Coffee, cardiology - she has reviewed EKGs and states not afib, shows PACs.  No further evaluation needed.  Discharged with strict return precautions.  Pt agreeable with plan.  Final Clinical Impressions(s) / ED Diagnoses   Final diagnoses:  Essential hypertension    New Prescriptions Discharge Medication List as of 03/13/2016  8:11 PM       Alfonzo Beers, MD 03/13/16 2209

## 2016-03-13 NOTE — ED Triage Notes (Signed)
Pt reports she feels like she has heart burn and has had a rapid pulse since lunch. Pt is alert and oriented X4. Pt reports normal HR for her is around 50bpm.

## 2016-03-13 NOTE — Discharge Instructions (Signed)
Return to the ED with any concerns including chest pain, palpitations, fainting, difficulty breathing, decreased level of alertness/lethargy, or any other alarming symptoms

## 2016-03-14 ENCOUNTER — Telehealth: Payer: Self-pay | Admitting: Internal Medicine

## 2016-03-14 NOTE — Telephone Encounter (Signed)
Spoke w patient. She notes she was seen in ER last night for episode of HR elevation, was calling to find out if Dr. Debara Pickett had any advice or if she needs to be seen in f/u.  ED physician consulted w Dr. Meda Coffee, who had reviewed EKGs and noted no concern for A Fib, EKGs did show PACs but no recommendation for further evaluation at this time.  Pt had 2 questions: -How soon should she follow up w Dr. Debara Pickett? -Will any of this affect a hip surgery she has scheduled for October 4th?  Pt aware I will defer to MD to address, I will call back with information, and that she is welcome to call back if further concerns.

## 2016-03-14 NOTE — Telephone Encounter (Signed)
New message    Pt calling reporting that she was in the ER last night for heart palpitations. She calling to speak to the nurse as to what she should do at this point. Please call.

## 2016-03-14 NOTE — Telephone Encounter (Signed)
Advice communicated to patient regarding follow up, she voiced understanding and thanks. Aware we have the option to adjust metoprolol but can leave this up to her. She will call if she has a return of palpitations.

## 2016-03-14 NOTE — Telephone Encounter (Signed)
She should be able to go ahead with surgery. We could increase the b-blocker slightly if she is still having palpitations, from 25 mg BID to 37.5 mg BID.  I can follow-up with her after surgery.  Dr. Lemmie Evens

## 2016-03-15 NOTE — Pre-Procedure Instructions (Signed)
JIAH BARI  03/15/2016      [facility] ([facility]), [place] - [place] [address] [place] ([place]) [place] [address] Phone: [phone] Fax: [phone]    Your procedure is scheduled on October 4  Report to [facility] at 1030 A.M.  Call this number if you have problems the morning of surgery:  [phone]   Remember:  Do not eat food or drink liquids after midnight.   Take these medicines the morning of surgery with A SIP OF WATER amlodipine (norvasc), cetirizine (zyretc), flutticasone (flonase), metoprolol (lopressor)  7 days prior to surgery STOP taking any Aspirin, Aleve, Naproxen, Ibuprofen, Motrin, Advil, Goody's, BC's, all herbal medications, fish oil, and all vitamins    Do not wear jewelry, make-up or nail polish.  Do not wear lotions, powders, or perfumes, or deoderant.  Do not shave 48 hours prior to surgery.  Men may shave face and neck.  Do not bring valuables to the hospital.  [facility] is not responsible for any belongings or valuables.  Contacts, dentures or bridgework may not be worn into surgery.  Leave your suitcase in the car.  After surgery it may be brought to your room.  For patients admitted to the hospital, discharge time will be determined by your treatment team.  Patients discharged the day of surgery will not be allowed to drive home.    Special instructions:   [facility]- Preparing For Surgery  Before surgery, you can play an important role. Because skin is not sterile, your skin needs to be as free of germs as possible. You can reduce the number of germs on your skin by washing with CHG (chlorahexidine gluconate) Soap before surgery.  CHG is an antiseptic cleaner which kills germs and bonds with the skin to continue killing germs even after washing.  Please do not use if you have an allergy to CHG or antibacterial soaps. If your skin becomes reddened/irritated stop using the CHG.  Do not  shave (including legs and underarms) for at least 48 hours prior to first CHG shower. It is OK to shave your face.  Please follow these instructions carefully.   1. Shower the NIGHT BEFORE SURGERY and the MORNING OF SURGERY with CHG.   2. If you chose to wash your hair, wash your hair first as usual with your normal shampoo.  3. After you shampoo, rinse your hair and body thoroughly to remove the shampoo.  4. Use CHG as you would any other liquid soap. You can apply CHG directly to the skin and wash gently with a scrungie or a clean washcloth.   5. Apply the CHG Soap to your body ONLY FROM THE NECK DOWN.  Do not use on open wounds or open sores. Avoid contact with your eyes, ears, mouth and genitals (private parts). Wash genitals (private parts) with your normal soap.  6. Wash thoroughly, paying special attention to the area where your surgery will be performed.  7. Thoroughly rinse your body with warm water from the neck down.  8. DO NOT shower/wash with your normal soap after using and rinsing off the CHG Soap.  9. Pat yourself dry with a CLEAN TOWEL.   10. Wear CLEAN PAJAMAS   11. Place CLEAN SHEETS on your bed the night of your first shower and DO NOT SLEEP WITH PETS.    Day of Surgery: Do not apply any deodorants/lotions. Please wear clean clothes to the  hospital/surgery center.      Please read over the following fact sheets that you were given. Coughing and Deep Breathing, MRSA Information and Surgical Site Infection Prevention

## 2016-03-18 ENCOUNTER — Encounter (HOSPITAL_COMMUNITY)
Admission: RE | Admit: 2016-03-18 | Discharge: 2016-03-18 | Disposition: A | Discharge status: Home / Self Care | Payer: Medicare Other | Source: Ambulatory Visit | Attending: Orthopaedic Surgery | Admitting: Orthopaedic Surgery

## 2016-03-18 ENCOUNTER — Encounter (HOSPITAL_COMMUNITY): Payer: Self-pay

## 2016-03-18 DIAGNOSIS — M1610 Unilateral primary osteoarthritis, unspecified hip: Secondary | ICD-10-CM | POA: Insufficient documentation

## 2016-03-18 DIAGNOSIS — Z01818 Encounter for other preprocedural examination: Secondary | ICD-10-CM | POA: Diagnosis present

## 2016-03-18 HISTORY — DX: Personal history of other diseases of the respiratory system: Z87.09

## 2016-03-18 HISTORY — DX: Other seasonal allergic rhinitis: J30.2

## 2016-03-18 LAB — COMPREHENSIVE METABOLIC PANEL
ALBUMIN: 3.5 g/dL (ref 3.5–5.0)
ALK PHOS: 50 U/L (ref 38–126)
ALT: 13 U/L — AB (ref 14–54)
ANION GAP: 9 (ref 5–15)
AST: 27 U/L (ref 15–41)
BUN: 12 mg/dL (ref 6–20)
CALCIUM: 8.8 mg/dL — AB (ref 8.9–10.3)
CO2: 27 mmol/L (ref 22–32)
CREATININE: 0.98 mg/dL (ref 0.44–1.00)
Chloride: 101 mmol/L (ref 101–111)
GFR calc Af Amer: 59 mL/min — ABNORMAL LOW (ref >60)
GFR calc non Af Amer: 51 mL/min — ABNORMAL LOW (ref >60)
GLUCOSE: 98 mg/dL (ref 65–99)
Potassium: 3.5 mmol/L (ref 3.5–5.1)
SODIUM: 137 mmol/L (ref 135–145)
Total Bilirubin: 0.7 mg/dL (ref 0.3–1.2)
Total Protein: 6.2 g/dL — ABNORMAL LOW (ref 6.5–8.1)

## 2016-03-18 LAB — CBC WITH DIFFERENTIAL/PLATELET
BASOS ABS: 0.1 10*3/uL (ref 0.0–0.1)
BASOS PCT: 1 %
EOS ABS: 0.5 10*3/uL (ref 0.0–0.7)
Eosinophils Relative: 6 %
HCT: 42.2 % (ref 36.0–46.0)
HEMOGLOBIN: 14 g/dL (ref 12.0–15.0)
Lymphocytes Relative: 25 %
Lymphs Abs: 2.2 10*3/uL (ref 0.7–4.0)
MCH: 29.7 pg (ref 26.0–34.0)
MCHC: 33.2 g/dL (ref 30.0–36.0)
MCV: 89.6 fL (ref 78.0–100.0)
Monocytes Absolute: 0.6 10*3/uL (ref 0.1–1.0)
Monocytes Relative: 6 %
NEUTROS PCT: 62 %
Neutro Abs: 5.5 10*3/uL (ref 1.7–7.7)
Platelets: 250 10*3/uL (ref 150–400)
RBC: 4.71 MIL/uL (ref 3.87–5.11)
RDW: 13.3 % (ref 11.5–15.5)
WBC: 8.7 10*3/uL (ref 4.0–10.5)

## 2016-03-18 LAB — URINALYSIS, ROUTINE W REFLEX MICROSCOPIC
Bilirubin Urine: NEGATIVE
Glucose, UA: NEGATIVE mg/dL
HGB URINE DIPSTICK: NEGATIVE
KETONES UR: NEGATIVE mg/dL
Nitrite: NEGATIVE
PROTEIN: NEGATIVE mg/dL
Specific Gravity, Urine: 1.018 (ref 1.005–1.030)
pH: 6 (ref 5.0–8.0)

## 2016-03-18 LAB — C-REACTIVE PROTEIN: CRP: 1 mg/dL — AB (ref <1.0)

## 2016-03-18 LAB — URINE MICROSCOPIC-ADD ON

## 2016-03-18 LAB — PROTIME-INR
INR: 1.07
Prothrombin Time: 13.9 seconds (ref 11.4–15.2)

## 2016-03-18 LAB — SEDIMENTATION RATE: Sed Rate: 5 mm/hr (ref 0–22)

## 2016-03-18 LAB — APTT: aPTT: 35 seconds (ref 24–36)

## 2016-03-18 LAB — TYPE AND SCREEN
ABO/RH(D): O POS
Antibody Screen: NEGATIVE

## 2016-03-18 LAB — SURGICAL PCR SCREEN
MRSA, PCR: NEGATIVE
Staphylococcus aureus: NEGATIVE

## 2016-03-18 NOTE — Progress Notes (Signed)
PCP - Suzanna Obey Cardiologist - [facility]  Chest x-ray - 03-13-16 EKG - 03/13/16 Stress Test - 06/21/14 ECHO - 06/21/16 Cardiac Cath - patient is unsure    Patient denies shortness of breath, fever, cough and chest pain at PAT appointment

## 2016-03-19 NOTE — Progress Notes (Signed)
Anesthesia Chart Review: Patient is a 82 year old female scheduled for left THA on 03/27/16 by Dr. Erlinda Hong.   History includes former smoker, HTN, HLD, bradycardia, anxiety, arthritis, open AAA repair '10 (Dr. Scot Dock), right THA 08/01/14. She was seen in the ED on 03/13/16 for tachycardia (reported home machine read 116 bpm). Initial HR on monitor in ED triage showed 160, however, it was felt to likely to be picking up p waves. She otherwise remained in the 70's. Cardiologist Dr. Meda Coffee was called to review EKG that showed showed PACs but no afib. Patient contacted her primary cardiologist Dr. Debara Pickett the next day regarding follow-up recommendations and if ED events would affect the timing of her 03/27/16 surgery. He stated, "She should be able to go ahead with surgery. We could increase the b-blocker slightly if she is still having palpitations, from 25 mg BID to 37.5 mg BID.  I can follow-up with her after surgery." PCP is Dr. Suzanna Obey with [facility].  Meds include amlodipine, ASA '81mg'$ , Zyrtec, Flonase, HCTZ, lovastatin, Lopressor, fish oil.  Pulse (!) (P) 55   Temp (P) 36.8 C   Resp (P) 18   Ht (P) '5\' 5"'$  (1.651 m)   Wt (P) 167 lb 14.4 oz (76.2 kg)   SpO2 (P) 95%   BMI (P) 27.94 kg/m  No BP was recorded.   03/13/16 EKG: SR, PACs, low voltage extremity and precordial leads, consider anterior infarct. No significant change since last tracing.  06/21/14 Echo: Study Conclusions - Left ventricle: The cavity size was normal. Wall thickness was normal. Systolic function was normal. The estimated ejection fraction was in the range of 60% to 65%. Doppler parameters are consistent with abnormal left ventricular relaxation (grade 1 diastolic dysfunction). - Pericardium, extracardiac: A trivial pericardial effusion was identified.  06/21/14 Nuclear stress test: Overall Impression:  Normal stress nuclear study. LV Wall Motion:  NL LV Function; NL Wall Motion. LVEF 69%.  03/13/16 CXR:  IMPRESSION: 1.No acute cardiopulmonary findings.   Preoperative labs noted.   If no acute changes then I would anticipate that she could proceed as planned.  George Hugh [facility]/Anesthesiology Phone [phone] 03/19/2016 4:58 PM

## 2016-03-19 NOTE — Progress Notes (Signed)
Left voice message with Sherrie, Surgical Coordinator, to make MD aware of abnormal UA.

## 2016-03-26 MED ORDER — TRANEXAMIC ACID 1000 MG/10ML IV SOLN
1000.0000 mg | INTRAVENOUS | Status: AC
  Administered 2016-03-27: 1000 mg via INTRAVENOUS
  Filled 2016-03-26: qty 10

## 2016-03-26 NOTE — Anesthesia Preprocedure Evaluation (Addendum)
Anesthesia Evaluation  Patient identified by MRN, date of birth, ID band Patient awake    Reviewed: Allergy & Precautions, H&P , NPO status , Patient's Chart, lab work & pertinent test results, reviewed documented beta blocker date and time   Airway Mallampati: II   Neck ROM: full    Dental   Pulmonary former smoker,    breath sounds clear to auscultation       Cardiovascular hypertension, Pt. on medications and Pt. on home beta blockers + dysrhythmias (bradycardia, 1st degree AV block) + Valvular Problems/Murmurs  Rhythm:regular Rate:Normal  HLD, s/p AAA repair in 2009  03/13/16 EKG: SR, PACs, low voltage extremity and precordial leads, consider anterior infarct. No significant change since last tracing.  06/21/14 Echo: Study Conclusions - Left ventricle: The cavity size was normal. Wall thickness was normal. Systolic function was normal. The estimated ejection fraction was in the range of 60% to 65%. Doppler parameters are consistent with abnormal left ventricular relaxation (grade 1 diastolic dysfunction). - Pericardium, extracardiac: A trivial pericardial effusion was identified.  06/21/14 Nuclear stress test: Overall Impression: Normal stress nuclear study. LV Wall Motion: NL LV Function; NL Wall Motion. LVEF 69%.  03/13/16 CXR: IMPRESSION: 1.No acute cardiopulmonary findings.    Neuro/Psych PSYCHIATRIC DISORDERS Anxiety    GI/Hepatic   Endo/Other    Renal/GU      Musculoskeletal  (+) Arthritis ,   Abdominal   Peds  Hematology   Anesthesia Other Findings - Left ventricle: The cavity size was normal. Wall thickness was normal. Systolic function was normal. The estimated ejection fraction was in the range of 60% to 65%. Doppler parameters are consistent with abnormal left ventricular relaxation (grade 1 diastolic dysfunction). - Pericardium, extracardiac: A trivial pericardial effusion  was identified.  Reproductive/Obstetrics                           Anesthesia Physical Anesthesia Plan  ASA: III  Anesthesia Plan: Spinal   Post-op Pain Management:    Induction: Intravenous  Airway Management Planned: Natural Airway and Simple Face Mask  Additional Equipment:   Intra-op Plan:   Post-operative Plan:   Informed Consent: I have reviewed the patients History and Physical, chart, labs and discussed the procedure including the risks, benefits and alternatives for the proposed anesthesia with the patient or authorized representative who has indicated his/her understanding and acceptance.   Dental advisory given  Plan Discussed with: CRNA, Anesthesiologist and Surgeon  Anesthesia Plan Comments: (I have discussed risks of neuraxial anesthesia including but not limited to infection, bleeding, nerve injury, back pain, headache, seizures, and failure of block. Patient denies bleeding disorders and is not currently anticoagulated. Labs have been reviewed. Risks and benefits discussed. All patient's questions answered.   Platelets 250 INR 1.07)       Anesthesia Quick Evaluation

## 2016-03-27 ENCOUNTER — Inpatient Hospital Stay (HOSPITAL_COMMUNITY)
Admission: RE | Admit: 2016-03-27 | Discharge: 2016-03-29 | DRG: 470 | Disposition: A | Discharge status: Home Health | Payer: Medicare Other | Source: Ambulatory Visit | Attending: Orthopaedic Surgery | Admitting: Orthopaedic Surgery

## 2016-03-27 ENCOUNTER — Inpatient Hospital Stay (HOSPITAL_COMMUNITY): Payer: Medicare Other

## 2016-03-27 ENCOUNTER — Inpatient Hospital Stay (HOSPITAL_COMMUNITY): Payer: Medicare Other | Admitting: Vascular Surgery

## 2016-03-27 ENCOUNTER — Encounter (HOSPITAL_COMMUNITY)
Admission: RE | Disposition: A | Discharge status: Home Health | Payer: Self-pay | Source: Ambulatory Visit | Attending: Orthopaedic Surgery

## 2016-03-27 ENCOUNTER — Inpatient Hospital Stay (HOSPITAL_COMMUNITY): Payer: Medicare Other | Admitting: Anesthesiology

## 2016-03-27 ENCOUNTER — Encounter (HOSPITAL_COMMUNITY): Payer: Self-pay | Admitting: Surgery

## 2016-03-27 DIAGNOSIS — Z96641 Presence of right artificial hip joint: Secondary | ICD-10-CM | POA: Diagnosis present

## 2016-03-27 DIAGNOSIS — M25552 Pain in left hip: Secondary | ICD-10-CM | POA: Diagnosis present

## 2016-03-27 DIAGNOSIS — Z87891 Personal history of nicotine dependence: Secondary | ICD-10-CM

## 2016-03-27 DIAGNOSIS — Z7982 Long term (current) use of aspirin: Secondary | ICD-10-CM

## 2016-03-27 DIAGNOSIS — M1612 Unilateral primary osteoarthritis, left hip: Principal | ICD-10-CM | POA: Diagnosis present

## 2016-03-27 DIAGNOSIS — I10 Essential (primary) hypertension: Secondary | ICD-10-CM | POA: Diagnosis present

## 2016-03-27 DIAGNOSIS — Z7951 Long term (current) use of inhaled steroids: Secondary | ICD-10-CM | POA: Diagnosis not present

## 2016-03-27 DIAGNOSIS — D62 Acute posthemorrhagic anemia: Secondary | ICD-10-CM | POA: Diagnosis not present

## 2016-03-27 DIAGNOSIS — E785 Hyperlipidemia, unspecified: Secondary | ICD-10-CM | POA: Diagnosis present

## 2016-03-27 DIAGNOSIS — Z96649 Presence of unspecified artificial hip joint: Secondary | ICD-10-CM

## 2016-03-27 DIAGNOSIS — F419 Anxiety disorder, unspecified: Secondary | ICD-10-CM | POA: Diagnosis present

## 2016-03-27 DIAGNOSIS — M1990 Unspecified osteoarthritis, unspecified site: Secondary | ICD-10-CM

## 2016-03-27 DIAGNOSIS — Z823 Family history of stroke: Secondary | ICD-10-CM

## 2016-03-27 DIAGNOSIS — Z8249 Family history of ischemic heart disease and other diseases of the circulatory system: Secondary | ICD-10-CM

## 2016-03-27 HISTORY — PX: TOTAL HIP ARTHROPLASTY: SHX124

## 2016-03-27 SURGERY — ARTHROPLASTY, HIP, TOTAL, ANTERIOR APPROACH
Anesthesia: Spinal | Site: Hip | Laterality: Left

## 2016-03-27 MED ORDER — GLYCOPYRROLATE 0.2 MG/ML IV SOSY
PREFILLED_SYRINGE | INTRAVENOUS | Status: AC
  Filled 2016-03-27: qty 3

## 2016-03-27 MED ORDER — ONDANSETRON HCL 4 MG/2ML IJ SOLN
4.0000 mg | Freq: Four times a day (QID) | INTRAMUSCULAR | Status: AC | PRN
  Administered 2016-03-27: 4 mg via INTRAVENOUS

## 2016-03-27 MED ORDER — EPHEDRINE SULFATE 50 MG/ML IJ SOLN
INTRAMUSCULAR | Status: DC | PRN
  Administered 2016-03-27 (×2): 10 mg via INTRAVENOUS

## 2016-03-27 MED ORDER — HYDROCODONE-ACETAMINOPHEN 7.5-325 MG PO TABS
1.0000 | ORAL_TABLET | Freq: Four times a day (QID) | ORAL | Status: DC
  Administered 2016-03-28 – 2016-03-29 (×6): 1 via ORAL
  Filled 2016-03-27 (×6): qty 1

## 2016-03-27 MED ORDER — PHENYLEPHRINE HCL 10 MG/ML IJ SOLN
INTRAVENOUS | Status: DC | PRN
  Administered 2016-03-27: 25 ug/min via INTRAVENOUS

## 2016-03-27 MED ORDER — LIDOCAINE 2% (20 MG/ML) 5 ML SYRINGE
INTRAMUSCULAR | Status: AC
  Filled 2016-03-27: qty 15

## 2016-03-27 MED ORDER — SODIUM CHLORIDE 0.9% FLUSH
INTRAVENOUS | Status: DC | PRN
  Administered 2016-03-27: 40 mL

## 2016-03-27 MED ORDER — FENTANYL CITRATE (PF) 100 MCG/2ML IJ SOLN
INTRAMUSCULAR | Status: AC
  Filled 2016-03-27: qty 2

## 2016-03-27 MED ORDER — PROPOFOL 500 MG/50ML IV EMUL
INTRAVENOUS | Status: DC | PRN
  Administered 2016-03-27: 55 ug/kg/min via INTRAVENOUS

## 2016-03-27 MED ORDER — CEFAZOLIN SODIUM-DEXTROSE 2-4 GM/100ML-% IV SOLN
2.0000 g | Freq: Four times a day (QID) | INTRAVENOUS | Status: AC
  Administered 2016-03-27 – 2016-03-28 (×2): 2 g via INTRAVENOUS
  Filled 2016-03-27 (×2): qty 100

## 2016-03-27 MED ORDER — ASPIRIN EC 325 MG PO TBEC
325.0000 mg | DELAYED_RELEASE_TABLET | Freq: Two times a day (BID) | ORAL | Status: DC
  Administered 2016-03-28 – 2016-03-29 (×3): 325 mg via ORAL
  Filled 2016-03-27 (×3): qty 1

## 2016-03-27 MED ORDER — METOCLOPRAMIDE HCL 5 MG PO TABS: 5.0000 mg | ORAL_TABLET | Freq: Three times a day (TID) | ORAL | Status: DC | PRN

## 2016-03-27 MED ORDER — EPHEDRINE 5 MG/ML INJ
INTRAVENOUS | Status: AC
  Filled 2016-03-27: qty 10

## 2016-03-27 MED ORDER — TRANEXAMIC ACID 1000 MG/10ML IV SOLN
2000.0000 mg | Freq: Once | INTRAVENOUS | Status: DC
  Filled 2016-03-27: qty 20

## 2016-03-27 MED ORDER — ACETAMINOPHEN 650 MG RE SUPP: 650.0000 mg | Freq: Four times a day (QID) | RECTAL | Status: DC | PRN

## 2016-03-27 MED ORDER — KETOROLAC TROMETHAMINE 30 MG/ML IJ SOLN: 30.0000 mg | Freq: Four times a day (QID) | INTRAMUSCULAR | Status: DC | PRN

## 2016-03-27 MED ORDER — ONDANSETRON HCL 4 MG PO TABS
4.0000 mg | ORAL_TABLET | Freq: Three times a day (TID) | ORAL | 0 refills | Status: DC | PRN
Start: 2016-03-27 — End: 2016-07-29

## 2016-03-27 MED ORDER — HYDROCHLOROTHIAZIDE 25 MG PO TABS
25.0000 mg | ORAL_TABLET | Freq: Every day | ORAL | Status: DC
  Administered 2016-03-28: 25 mg via ORAL
  Filled 2016-03-27 (×2): qty 1

## 2016-03-27 MED ORDER — CHLORHEXIDINE GLUCONATE 4 % EX LIQD: 60.0000 mL | Freq: Once | CUTANEOUS | Status: DC

## 2016-03-27 MED ORDER — METHOCARBAMOL 1000 MG/10ML IJ SOLN
500.0000 mg | Freq: Four times a day (QID) | INTRAMUSCULAR | Status: DC | PRN
  Filled 2016-03-27: qty 5

## 2016-03-27 MED ORDER — CEFAZOLIN SODIUM-DEXTROSE 2-4 GM/100ML-% IV SOLN
2.0000 g | INTRAVENOUS | Status: AC
  Administered 2016-03-27: 2 g via INTRAVENOUS
  Filled 2016-03-27: qty 100

## 2016-03-27 MED ORDER — LIDOCAINE HCL (CARDIAC) 20 MG/ML IV SOLN
INTRAVENOUS | Status: DC | PRN
  Administered 2016-03-27: 40 mg via INTRAVENOUS

## 2016-03-27 MED ORDER — MAGNESIUM CITRATE PO SOLN: 1.0000 | Freq: Once | ORAL | Status: DC | PRN

## 2016-03-27 MED ORDER — SUGAMMADEX SODIUM 200 MG/2ML IV SOLN
INTRAVENOUS | Status: AC
  Filled 2016-03-27: qty 2

## 2016-03-27 MED ORDER — PROPOFOL 10 MG/ML IV BOLUS
INTRAVENOUS | Status: AC
  Filled 2016-03-27: qty 20

## 2016-03-27 MED ORDER — 0.9 % SODIUM CHLORIDE (POUR BTL) OPTIME
TOPICAL | Status: DC | PRN
  Administered 2016-03-27: 1000 mL

## 2016-03-27 MED ORDER — FENTANYL CITRATE (PF) 100 MCG/2ML IJ SOLN
25.0000 ug | INTRAMUSCULAR | Status: DC | PRN
  Administered 2016-03-27 (×3): 50 ug via INTRAVENOUS

## 2016-03-27 MED ORDER — FENTANYL CITRATE (PF) 100 MCG/2ML IJ SOLN
INTRAMUSCULAR | Status: DC | PRN
  Administered 2016-03-27 (×2): 50 ug via INTRAVENOUS

## 2016-03-27 MED ORDER — POLYETHYLENE GLYCOL 3350 17 G PO PACK: 17.0000 g | PACK | Freq: Every day | ORAL | Status: DC | PRN

## 2016-03-27 MED ORDER — AMLODIPINE BESYLATE 5 MG PO TABS
5.0000 mg | ORAL_TABLET | Freq: Every day | ORAL | Status: DC
  Administered 2016-03-28: 5 mg via ORAL
  Filled 2016-03-27 (×2): qty 1

## 2016-03-27 MED ORDER — HYDROCODONE-ACETAMINOPHEN 10-325 MG PO TABS
1.0000 | ORAL_TABLET | ORAL | Status: DC | PRN
  Administered 2016-03-27 – 2016-03-28 (×3): 2 via ORAL
  Filled 2016-03-27 (×4): qty 2

## 2016-03-27 MED ORDER — METOPROLOL TARTRATE 25 MG PO TABS
25.0000 mg | ORAL_TABLET | Freq: Two times a day (BID) | ORAL | Status: DC
  Administered 2016-03-27 – 2016-03-29 (×4): 25 mg via ORAL
  Filled 2016-03-27 (×4): qty 1

## 2016-03-27 MED ORDER — DIPHENHYDRAMINE HCL 12.5 MG/5ML PO ELIX: 25.0000 mg | ORAL_SOLUTION | ORAL | Status: DC | PRN

## 2016-03-27 MED ORDER — MORPHINE SULFATE (PF) 2 MG/ML IV SOLN: 1.0000 mg | INTRAVENOUS | Status: DC | PRN

## 2016-03-27 MED ORDER — ROCURONIUM BROMIDE 10 MG/ML (PF) SYRINGE
PREFILLED_SYRINGE | INTRAVENOUS | Status: AC
  Filled 2016-03-27: qty 20

## 2016-03-27 MED ORDER — SORBITOL 70 % SOLN: 30.0000 mL | Freq: Every day | Status: DC | PRN

## 2016-03-27 MED ORDER — ALUM & MAG HYDROXIDE-SIMETH 200-200-20 MG/5ML PO SUSP: 30.0000 mL | ORAL | Status: DC | PRN

## 2016-03-27 MED ORDER — POVIDONE-IODINE 10 % EX SOLN
CUTANEOUS | Status: DC | PRN
  Administered 2016-03-27: 1 via TOPICAL

## 2016-03-27 MED ORDER — SODIUM CHLORIDE 0.9 % IR SOLN
Status: DC | PRN
  Administered 2016-03-27: 3000 mL
  Administered 2016-03-27: 1000 mL

## 2016-03-27 MED ORDER — LACTATED RINGERS IV SOLN
INTRAVENOUS | Status: DC
  Administered 2016-03-27: 12:00:00 via INTRAVENOUS

## 2016-03-27 MED ORDER — METOCLOPRAMIDE HCL 5 MG/ML IJ SOLN: 5.0000 mg | Freq: Three times a day (TID) | INTRAMUSCULAR | Status: DC | PRN

## 2016-03-27 MED ORDER — BUPIVACAINE LIPOSOME 1.3 % IJ SUSP
20.0000 mL | Freq: Once | INTRAMUSCULAR | Status: DC
  Filled 2016-03-27: qty 20

## 2016-03-27 MED ORDER — HYDROCODONE-ACETAMINOPHEN 7.5-325 MG PO TABS: 1.0000 | ORAL_TABLET | Freq: Four times a day (QID) | ORAL | 0 refills | Status: DC | PRN

## 2016-03-27 MED ORDER — METHOCARBAMOL 500 MG PO TABS
ORAL_TABLET | ORAL | Status: AC
  Administered 2016-03-27: 500 mg via ORAL
  Filled 2016-03-27: qty 1

## 2016-03-27 MED ORDER — PHENYLEPHRINE HCL 10 MG/ML IJ SOLN
INTRAMUSCULAR | Status: AC
  Filled 2016-03-27: qty 1

## 2016-03-27 MED ORDER — ACETAMINOPHEN 325 MG PO TABS: 650.0000 mg | ORAL_TABLET | Freq: Four times a day (QID) | ORAL | Status: DC | PRN

## 2016-03-27 MED ORDER — SODIUM CHLORIDE 0.9 % IV SOLN
INTRAVENOUS | Status: DC | PRN
  Administered 2016-03-27: 2000 mg via TOPICAL

## 2016-03-27 MED ORDER — ASPIRIN 81 MG PO TABS: 81.0000 mg | ORAL_TABLET | Freq: Every day | ORAL | Status: DC

## 2016-03-27 MED ORDER — SENNOSIDES-DOCUSATE SODIUM 8.6-50 MG PO TABS: 1.0000 | ORAL_TABLET | Freq: Every evening | ORAL | 1 refills | Status: DC | PRN

## 2016-03-27 MED ORDER — DEXAMETHASONE SODIUM PHOSPHATE 10 MG/ML IJ SOLN
10.0000 mg | Freq: Once | INTRAMUSCULAR | Status: AC
  Administered 2016-03-28: 10 mg via INTRAVENOUS
  Filled 2016-03-27: qty 1

## 2016-03-27 MED ORDER — PHENOL 1.4 % MT LIQD: 1.0000 | OROMUCOSAL | Status: DC | PRN

## 2016-03-27 MED ORDER — BUPIVACAINE LIPOSOME 1.3 % IJ SUSP: INTRAMUSCULAR | Status: DC | PRN

## 2016-03-27 MED ORDER — SODIUM CHLORIDE 0.9 % IV SOLN
INTRAVENOUS | Status: DC
  Administered 2016-03-27: 20:00:00 via INTRAVENOUS

## 2016-03-27 MED ORDER — ASPIRIN EC 325 MG PO TBEC
325.0000 mg | DELAYED_RELEASE_TABLET | Freq: Two times a day (BID) | ORAL | 0 refills | Status: DC
Start: 2016-03-27 — End: 2017-11-24

## 2016-03-27 MED ORDER — ONDANSETRON HCL 4 MG/2ML IJ SOLN: 4.0000 mg | Freq: Four times a day (QID) | INTRAMUSCULAR | Status: DC | PRN

## 2016-03-27 MED ORDER — METHOCARBAMOL 500 MG PO TABS
500.0000 mg | ORAL_TABLET | Freq: Four times a day (QID) | ORAL | Status: DC | PRN
  Administered 2016-03-27 – 2016-03-28 (×4): 500 mg via ORAL
  Filled 2016-03-27 (×3): qty 1

## 2016-03-27 MED ORDER — FLUTICASONE PROPIONATE 50 MCG/ACT NA SUSP
1.0000 | Freq: Every evening | NASAL | Status: DC | PRN
  Filled 2016-03-27: qty 16

## 2016-03-27 MED ORDER — GLYCOPYRROLATE 0.2 MG/ML IJ SOLN
INTRAMUSCULAR | Status: DC | PRN
  Administered 2016-03-27: .2 mg via INTRAVENOUS

## 2016-03-27 MED ORDER — MENTHOL 3 MG MT LOZG
1.0000 | LOZENGE | OROMUCOSAL | Status: DC | PRN
  Filled 2016-03-27: qty 9

## 2016-03-27 MED ORDER — PROPOFOL 10 MG/ML IV BOLUS
INTRAVENOUS | Status: DC | PRN
  Administered 2016-03-27: 20 mg via INTRAVENOUS
  Administered 2016-03-27 (×2): 10 mg via INTRAVENOUS

## 2016-03-27 MED ORDER — LACTATED RINGERS IV SOLN
INTRAVENOUS | Status: DC | PRN
  Administered 2016-03-27 (×2): via INTRAVENOUS

## 2016-03-27 MED ORDER — BACLOFEN 10 MG PO TABS: 10.0000 mg | ORAL_TABLET | Freq: Three times a day (TID) | ORAL | 2 refills | Status: DC | PRN

## 2016-03-27 MED ORDER — ONDANSETRON HCL 4 MG PO TABS: 4.0000 mg | ORAL_TABLET | Freq: Four times a day (QID) | ORAL | Status: DC | PRN

## 2016-03-27 MED ORDER — FENTANYL CITRATE (PF) 100 MCG/2ML IJ SOLN
INTRAMUSCULAR | Status: AC
Start: 2016-03-27 — End: 2016-03-27
  Administered 2016-03-27: 50 ug via INTRAVENOUS
  Filled 2016-03-27: qty 2

## 2016-03-27 MED ORDER — PHENYLEPHRINE 40 MCG/ML (10ML) SYRINGE FOR IV PUSH (FOR BLOOD PRESSURE SUPPORT)
PREFILLED_SYRINGE | INTRAVENOUS | Status: AC
  Filled 2016-03-27: qty 10

## 2016-03-27 MED ORDER — ACETAMINOPHEN 500 MG PO TABS
1000.0000 mg | ORAL_TABLET | Freq: Four times a day (QID) | ORAL | Status: AC
  Filled 2016-03-27: qty 2

## 2016-03-27 SURGICAL SUPPLY — 66 items
BAG DECANTER FOR FLEXI CONT (MISCELLANEOUS) ×3 IMPLANT
BLADE SURG 10 STRL SS (BLADE) ×2 IMPLANT
CABLE CERLAGE W/CRIMP 1.8 (Cable) ×1 IMPLANT
CABLE CERLAGE W/CRIMP 1.8MM (Cable) ×1 IMPLANT
CAPT HIP TOTAL 2 ×2 IMPLANT
CELLS DAT CNTRL 66122 CELL SVR (MISCELLANEOUS) ×1 IMPLANT
CLOSURE STERI-STRIP 1/2X4 (GAUZE/BANDAGES/DRESSINGS) ×1
CLSR STERI-STRIP ANTIMIC 1/2X4 (GAUZE/BANDAGES/DRESSINGS) ×1 IMPLANT
COVER SURGICAL LIGHT HANDLE (MISCELLANEOUS) ×3 IMPLANT
DECANTER SPIKE VIAL GLASS SM (MISCELLANEOUS) ×2 IMPLANT
DRAPE C-ARM 42X72 X-RAY (DRAPES) ×3 IMPLANT
DRAPE STERI IOBAN 125X83 (DRAPES) ×3 IMPLANT
DRAPE U-SHAPE 47X51 STRL (DRAPES) ×9 IMPLANT
DRSG AQUACEL AG ADV 3.5X10 (GAUZE/BANDAGES/DRESSINGS) ×3 IMPLANT
DURAPREP 26ML APPLICATOR (WOUND CARE) ×3 IMPLANT
ELECT BLADE 4.0 EZ CLEAN MEGAD (MISCELLANEOUS) ×3
ELECT REM PT RETURN 9FT ADLT (ELECTROSURGICAL) ×3
ELECTRODE BLDE 4.0 EZ CLN MEGD (MISCELLANEOUS) ×1 IMPLANT
ELECTRODE REM PT RTRN 9FT ADLT (ELECTROSURGICAL) ×1 IMPLANT
GLOVE BIOGEL PI IND STRL 6.5 (GLOVE) IMPLANT
GLOVE BIOGEL PI INDICATOR 6.5 (GLOVE) ×2
GLOVE ECLIPSE 7.0 STRL STRAW (GLOVE) ×2 IMPLANT
GLOVE SKINSENSE NS SZ7.5 (GLOVE) ×2
GLOVE SKINSENSE STRL SZ7.5 (GLOVE) ×1 IMPLANT
GLOVE SURG SS PI 6.0 STRL IVOR (GLOVE) ×2 IMPLANT
GLOVE SURG SS PI 6.5 STRL IVOR (GLOVE) ×2 IMPLANT
GLOVE SURG SYN 7.5  E (GLOVE) ×10
GLOVE SURG SYN 7.5 E (GLOVE) ×5 IMPLANT
GLOVE SURG SYN 7.5 PF PI (GLOVE) ×2 IMPLANT
GOWN SRG XL XLNG 56XLVL 4 (GOWN DISPOSABLE) ×1 IMPLANT
GOWN STRL NON-REIN XL XLG LVL4 (GOWN DISPOSABLE) ×3
GOWN STRL REUS W/ TWL LRG LVL3 (GOWN DISPOSABLE) IMPLANT
GOWN STRL REUS W/ TWL XL LVL3 (GOWN DISPOSABLE) IMPLANT
GOWN STRL REUS W/TWL LRG LVL3 (GOWN DISPOSABLE)
GOWN STRL REUS W/TWL XL LVL3 (GOWN DISPOSABLE) ×3
HANDPIECE INTERPULSE COAX TIP (DISPOSABLE) ×3
HOOD PEEL AWAY FLYTE STAYCOOL (MISCELLANEOUS) ×6 IMPLANT
IV NS 1000ML (IV SOLUTION) ×3
IV NS 1000ML BAXH (IV SOLUTION) ×1 IMPLANT
IV NS IRRIG 3000ML ARTHROMATIC (IV SOLUTION) ×3 IMPLANT
KIT BASIN OR (CUSTOM PROCEDURE TRAY) ×3 IMPLANT
MARKER SKIN DUAL TIP RULER LAB (MISCELLANEOUS) ×3 IMPLANT
NDL SPNL 18GX3.5 QUINCKE PK (NEEDLE) ×1 IMPLANT
NEEDLE SPNL 18GX3.5 QUINCKE PK (NEEDLE) ×3 IMPLANT
PACK TOTAL JOINT (CUSTOM PROCEDURE TRAY) ×3 IMPLANT
PACK UNIVERSAL I (CUSTOM PROCEDURE TRAY) ×3 IMPLANT
RETRACTOR WND ALEXIS 18 MED (MISCELLANEOUS) ×1 IMPLANT
RTRCTR WOUND ALEXIS 18CM MED (MISCELLANEOUS) ×3
SAW OSC TIP CART 19.5X105X1.3 (SAW) ×3 IMPLANT
SEALER BIPOLAR AQUA 6.0 (INSTRUMENTS) ×3 IMPLANT
SET HNDPC FAN SPRY TIP SCT (DISPOSABLE) ×1 IMPLANT
SPONGE LAP 18X18 X RAY DECT (DISPOSABLE) ×2 IMPLANT
STAPLER VISISTAT 35W (STAPLE) IMPLANT
SUT ETHIBOND 2 V 37 (SUTURE) ×3 IMPLANT
SUT ETHIBOND NAB CT1 #1 30IN (SUTURE) ×9 IMPLANT
SUT VIC AB 0 CT1 27 (SUTURE) ×3
SUT VIC AB 0 CT1 27XBRD ANBCTR (SUTURE) IMPLANT
SUT VIC AB 1 CT1 27 (SUTURE) ×3
SUT VIC AB 1 CT1 27XBRD ANBCTR (SUTURE) ×1 IMPLANT
SUT VIC AB 2-0 CT1 27 (SUTURE) ×3
SUT VIC AB 2-0 CT1 TAPERPNT 27 (SUTURE) ×1 IMPLANT
SYR 20CC LL (SYRINGE) ×3 IMPLANT
SYR 50ML LL SCALE MARK (SYRINGE) ×3 IMPLANT
TOWEL OR 17X26 10 PK STRL BLUE (TOWEL DISPOSABLE) ×3 IMPLANT
TRAY CATH 16FR W/PLASTIC CATH (SET/KITS/TRAYS/PACK) ×3 IMPLANT
YANKAUER SUCT BULB TIP NO VENT (SUCTIONS) ×3 IMPLANT

## 2016-03-27 NOTE — Discharge Instructions (Signed)

## 2016-03-27 NOTE — Progress Notes (Signed)
Patient arrived to unit in stable condition, oriented to room, call light within reach.

## 2016-03-27 NOTE — Op Note (Signed)
LEFT TOTAL HIP ARTHROPLASTY ANTERIOR APPROACH  Procedure Note BRITTNIE LEWEY   [id number]  Pre-op Diagnosis: left hip degenerative joint disease     Post-op Diagnosis: same   Operative Procedures  1. Total hip replacement; Left hip; uncemented [id number]   Personnel  Surgeon(s): Leandrew Koyanagi, MD   Anesthesia: spinal  Prosthesis: Depuy Acetabulum: Pinnacle 52 mm Femur: Corail KA 12 Head: 36 mm size: +12 Liner: neutral Bearing Type: Metal on poly  Date of Service: 03/27/2016  Total Hip Arthroplasty (Anterior Approach) Op Note:  After informed consent was obtained and the operative extremity marked in the holding area, the patient was brought back to the operating room and placed supine on the HANA table. Next, the operative extremity was prepped and draped in normal sterile fashion. Surgical timeout occurred verifying patient identification, surgical site, surgical procedure and administration of antibiotics.  A modified anterior Smith-Peterson approach to the hip was performed, using the interval between tensor fascia lata and sartorius.  Dissection was carried bluntly down onto the anterior hip capsule. The lateral femoral circumflex vessels were identified and coagulated. A capsulotomy was performed and the capsular flaps tagged for later repair.  Fluoroscopy was utilized to prepare for the femoral neck cut. The neck osteotomy was performed. The femoral head was removed, the acetabular rim was cleared of soft tissue and attention was turned to reaming the acetabulum.  Sequential reaming was performed under fluoroscopic guidance. We reamed to a size 51 mm, and then impacted the acetabular shell. The liner was then placed after irrigation and attention turned to the femur.  After placing the femoral hook, the leg was taken to externally rotated, extended and adducted position taking care to perform soft tissue releases to allow for adequate mobilization of the femur. Soft tissue was  cleared from the shoulder of the greater trochanter and the hook elevator used to improve exposure of the proximal femur. Sequential broaching performed up to a size 12. I did recognize an area of the posterior calcar that was thin but di not have a fracture.  Because of her age and bone quality I decided to place a cable as added security against a fracture.  Trial neck and head were placed. The leg was brought back up to neutral and the construct reduced. The position and sizing of components, offset and leg lengths were checked using fluoroscopy. Stability of the construct was checked in extension and external rotation without any subluxation or impingement of prosthesis. We dislocated the prosthesis, dropped the leg back into position, removed trial components, and irrigated copiously. The final stem and head was then placed, the leg brought back up, the system reduced and fluoroscopy used to verify positioning.  We irrigated, obtained hemostasis and closed the capsule using #2 ethibond suture.  Dilute betadyne solution was used. The fascia was closed with #1 vicryl plus, the deep fat layer was closed with 0 vicryl, the subcutaneous layers closed with 2.0 Vicryl Plus and the skin closed with 3.0 monocryl and steri strips. A sterile dressing was applied. The patient was awakened in the operating room and taken to recovery in stable condition.  All sponge, needle, and instrument counts were correct at the end of the case.   Position: supine  Complications: none.  Time Out: performed   Drains/Packing: none  Estimated blood loss: 200 cc  Returned to Recovery Room: in good condition.   Antibiotics: yes   Mechanical VTE (DVT) Prophylaxis: sequential compression devices, TED thigh-high  Chemical VTE (  DVT) Prophylaxis: aspirin   Fluid Replacement: see anesthesia record  Specimens Removed: 1 to pathology   Sponge and Instrument Count Correct? yes   PACU: portable radiograph - low AP    Admission: inpatient status, start PT & OT POD#1  Plan/RTC: Return in 2 weeks for staple removal. Return in 6 weeks to see MD.  Weight Bearing/Load Lower Extremity: full  Hip precautions: none Suture Removal: 10-14 days  Betadine to incision twice daily once dressing is removed on POD#7  N. Eduard Roux, MD [facility] [phone] 3:23 PM      Implant Name Type Inv. Item Serial No. Manufacturer Lot No. LRB No. Used  PIN SECTOR W/GRIP ACE CUP 52MM - [id number] Hips PIN SECTOR W/GRIP ACE CUP 52MM  DEPUY [id number] Left 1  LINER ACETAB NEUTRAL 36ID 520D - [id number] Liner LINER ACETAB NEUTRAL 36ID 520D  DEPUY [id number] Left 1  SCREW 6.5MMX25MM - [id number] Screw SCREW 6.5MMX25MM  DEPUY [id number] Left 1  STEM CORAIL KA12 - [id number] Stem STEM CORAIL KA12  DEPUY [id number] Left 1  ARTICULEZE HEAD 36MM 12 - [id number] Hips ARTICULEZE HEAD 36MM 12  [id number]  Left 1  cable ready cable grip system, 1.52m dia 25  in. (6356m     2230-10-10 Zimmer [id number] 1

## 2016-03-27 NOTE — Anesthesia Procedure Notes (Signed)
Spinal  Patient location during procedure: OR Start time: 03/27/2016 12:55 PM End time: 03/27/2016 1:03 PM Staffing Anesthesiologist: Marcie Bal, ADAM Performed: anesthesiologist  Preanesthetic Checklist Completed: patient identified, site marked, surgical consent, pre-op evaluation, timeout performed, IV checked, risks and benefits discussed and monitors and equipment checked Spinal Block Patient position: sitting Prep: Betadine and site prepped and draped Patient monitoring: heart rate, cardiac monitor, continuous pulse ox and blood pressure Approach: midline Location: L3-4 Injection technique: single-shot Needle Needle type: Pencan  Needle gauge: 24 G Needle length: 10 cm Assessment Sensory level: T8 Additional Notes Pt tolerated the procedure well.

## 2016-03-27 NOTE — Anesthesia Procedure Notes (Signed)
Procedure Name: MAC Date/Time: 03/27/2016 1:00 PM Performed by: Neldon Newport Pre-anesthesia Checklist: Timeout performed, Patient being monitored, Suction available, Emergency Drugs available and Patient identified Oxygen Delivery Method: Simple face mask Placement Confirmation: positive ETCO2 Dental Injury: Teeth and Oropharynx as per pre-operative assessment

## 2016-03-27 NOTE — H&P (Signed)
PREOPERATIVE H&P  Chief Complaint: left hip degenerative joint disease  HPI: Mary Merritt is a 82 y.o. female who presents for surgical treatment of left hip degenerative joint disease.  She denies any changes in medical history.  Past Medical History:  Diagnosis Date  . Anxiety   . Arthritis   . Dysrhythmia    bradycardia  . History of bronchitis   . Hyperlipidemia   . Hypertension   . Seasonal allergies    Past Surgical History:  Procedure Laterality Date  . ABDOMINAL AORTIC ANEURYSM REPAIR     2009, Dr. Scot Dock  . CARDIAC CATHETERIZATION     patient thinks it was in 09 with dr Scot Dock  . cataracts removed    . INCONTINENCE SURGERY    . TOTAL HIP ARTHROPLASTY Right 08/01/2014   Procedure: RIGHT TOTAL HIP ARTHROPLASTY ANTERIOR APPROACH;  Surgeon: Marianna Payment, MD;  Location: [facility];  Service: Orthopedics;  Laterality: Right;   Social History   Social History  . Marital status: Widowed    Spouse name: N/A  . Number of children: N/A  . Years of education: N/A   Social History Main Topics  . Smoking status: Former Smoker    Years: 30.00    Quit date: 05/31/1984  . Smokeless tobacco: Never Used  . Alcohol use No  . Drug use: No  . Sexual activity: Not Asked   Other Topics Concern  . None   Social History Narrative  . None   Family History  Problem Relation Age of Onset  . Heart failure Father   . Stroke Mother    Allergies  Allergen Reactions  . Percocet [Oxycodone-Acetaminophen] Other (See Comments)    Makes head feel funny   Prior to Admission medications   Medication Sig Start Date End Date Taking? Authorizing Provider  amLODipine (NORVASC) 5 MG tablet Take 1 tablet (5 mg total) by mouth daily. Patient taking differently: Take 5 mg by mouth at bedtime.  07/27/15  Yes Pixie Casino, MD  aspirin 81 MG tablet Take 81 mg by mouth daily.   Yes Historical Provider, MD  cetirizine (ZYRTEC) 10 MG tablet Take 10 mg by mouth at bedtime.    Yes  Historical Provider, MD  fluticasone (FLONASE) 50 MCG/ACT nasal spray Place 1 spray into the nose at bedtime as needed for allergies.  02/13/16 02/12/17 Yes Historical Provider, MD  hydrochlorothiazide (HYDRODIURIL) 25 MG tablet Take 25 mg by mouth daily. 07/15/15  Yes Historical Provider, MD  ibuprofen (ADVIL,MOTRIN) 200 MG tablet Take 400 mg by mouth daily as needed for moderate pain.   Yes Historical Provider, MD  lovastatin (MEVACOR) 20 MG tablet Take 20 mg by mouth at bedtime.    Yes Historical Provider, MD  metoprolol tartrate (LOPRESSOR) 25 MG tablet Take 1 tablet (25 mg total) by mouth 2 (two) times daily. 09/21/15  Yes Pixie Casino, MD  Omega-3 Fatty Acids (FISH OIL) 1000 MG CAPS Take 2,000 mg by mouth at bedtime.    Yes Historical Provider, MD     Positive ROS: All other systems have been reviewed and were otherwise negative with the exception of those mentioned in the HPI and as above.  Physical Exam: General: Alert, no acute distress Cardiovascular: No pedal edema Respiratory: No cyanosis, no use of accessory musculature GI: abdomen soft Skin: No lesions in the area of chief complaint Neurologic: Sensation intact distally Psychiatric: Patient is competent for consent with normal mood and affect Lymphatic: no lymphedema  MUSCULOSKELETAL: exam stable  Assessment: left hip degenerative joint disease  Plan: Plan for Procedure(s): LEFT TOTAL HIP ARTHROPLASTY ANTERIOR APPROACH  The risks benefits and alternatives were discussed with the patient including but not limited to the risks of nonoperative treatment, versus surgical intervention including infection, bleeding, nerve injury,  blood clots, cardiopulmonary complications, morbidity, mortality, among others, and they were willing to proceed.   Marianna Payment, MD   03/27/2016 12:09 PM

## 2016-03-27 NOTE — Transfer of Care (Signed)
Immediate Anesthesia Transfer of Care Note  Patient: Mary Merritt  Procedure(s) Performed: Procedure(s): LEFT TOTAL HIP ARTHROPLASTY ANTERIOR APPROACH (Left)  Patient Location: PACU  Anesthesia Type:Spinal  Level of Consciousness: awake, alert , oriented and patient cooperative  Airway & Oxygen Therapy: Patient Spontanous Breathing and Patient connected to face mask oxygen  Post-op Assessment: Report given to RN and Post -op Vital signs reviewed and stable  Post vital signs: Reviewed and stable  Last Vitals:  Vitals:   03/27/16 1108 03/27/16 1601  BP: (!) 203/61 (!) (P) 124/107  Pulse: (!) 50 63  Resp: 17 (!) 23  Temp: 36.6 C 36.2 C    Last Pain:  Vitals:   03/27/16 1601  TempSrc:   PainSc: (P) 0-No pain         Complications: No apparent anesthesia complications

## 2016-03-28 ENCOUNTER — Encounter (HOSPITAL_COMMUNITY): Payer: Self-pay | Admitting: Orthopaedic Surgery

## 2016-03-28 LAB — BASIC METABOLIC PANEL
Anion gap: 8 (ref 5–15)
BUN: 9 mg/dL (ref 6–20)
CALCIUM: 8.4 mg/dL — AB (ref 8.9–10.3)
CHLORIDE: 98 mmol/L — AB (ref 101–111)
CO2: 27 mmol/L (ref 22–32)
CREATININE: 0.88 mg/dL (ref 0.44–1.00)
GFR calc Af Amer: >60 mL/min (ref >60)
GFR calc non Af Amer: 58 mL/min — ABNORMAL LOW (ref >60)
Glucose, Bld: 117 mg/dL — ABNORMAL HIGH (ref 65–99)
Potassium: 3.1 mmol/L — ABNORMAL LOW (ref 3.5–5.1)
SODIUM: 133 mmol/L — AB (ref 135–145)

## 2016-03-28 LAB — CBC
HCT: 34.7 % — ABNORMAL LOW (ref 36.0–46.0)
Hemoglobin: 11.5 g/dL — ABNORMAL LOW (ref 12.0–15.0)
MCH: 29.3 pg (ref 26.0–34.0)
MCHC: 33.1 g/dL (ref 30.0–36.0)
MCV: 88.3 fL (ref 78.0–100.0)
PLATELETS: 211 10*3/uL (ref 150–400)
RBC: 3.93 MIL/uL (ref 3.87–5.11)
RDW: 13.1 % (ref 11.5–15.5)
WBC: 9.5 10*3/uL (ref 4.0–10.5)

## 2016-03-28 MED ORDER — POTASSIUM CHLORIDE CRYS ER 20 MEQ PO TBCR
40.0000 meq | EXTENDED_RELEASE_TABLET | Freq: Two times a day (BID) | ORAL | Status: AC
  Administered 2016-03-28 – 2016-03-29 (×2): 40 meq via ORAL
  Filled 2016-03-28 (×2): qty 2

## 2016-03-28 MED ORDER — POTASSIUM CHLORIDE 20 MEQ PO PACK
40.0000 meq | PACK | Freq: Two times a day (BID) | ORAL | Status: DC
  Administered 2016-03-28: 40 meq via ORAL
  Filled 2016-03-28 (×3): qty 2

## 2016-03-28 NOTE — Evaluation (Signed)
Physical Therapy Evaluation Patient Details Name: Mary Merritt MRN: [id number] DOB: [birth year] Today's Date: 03/28/2016   History of Present Illness  pt presents post L THR.  pt with hx of R THR, Anxiety, HTN, and AAA Repair.    Clinical Impression  Pt very motivated and moving quite well.  Pt with good recall of safe technique from previous THR.  Pt indicates family will be taking turns to A her at home and that she has all needed DME.  Will continue to follow and anticipate pt will make great progress to return to home at D/C.      Follow Up Recommendations Home health PT;Supervision/Assistance - 24 hour    Equipment Recommendations  None recommended by PT    Recommendations for Other Services       Precautions / Restrictions Precautions Precautions: Fall Restrictions Weight Bearing Restrictions: Yes LLE Weight Bearing: Weight bearing as tolerated      Mobility  Bed Mobility               General bed mobility comments: pt sitting in recliner  Transfers Overall transfer level: Needs assistance Equipment used: Rolling walker (2 wheeled) Transfers: Sit to/from Stand Sit to Stand: Supervision         General transfer comment: pt demonstrates good technique with use of UEs and good awareness of safety.  Ambulation/Gait Ambulation/Gait assistance: Min guard Ambulation Distance (Feet): 120 Feet Assistive device: Rolling walker (2 wheeled) Gait Pattern/deviations: Step-to pattern;Step-through pattern;Decreased stride length;Trunk flexed     General Gait Details: pt working towards mroe step through gait.  cues for mroe upright posture and encouragement.    Stairs            Wheelchair Mobility    Modified Rankin (Stroke Patients Only)       Balance Overall balance assessment: Needs assistance Sitting-balance support: No upper extremity supported;Feet supported Sitting balance-Leahy Scale: Good     Standing balance support: Bilateral upper  extremity supported;Single extremity supported;During functional activity Standing balance-Leahy Scale: Poor                               Pertinent Vitals/Pain Pain Assessment: 0-10 Pain Score: 4  Pain Location: L hip Pain Descriptors / Indicators: Sore;Tightness Pain Intervention(s): Monitored during session;Premedicated before session;Repositioned    Home Living Family/patient expects to be discharged to:: Private residence Living Arrangements: Alone Available Help at Discharge: Family;Available 24 hours/day (Son and daughter to take turns) Type of Home: House Home Access: Stairs to enter Entrance Stairs-Rails: Right Entrance Stairs-Number of Steps: 3 Home Layout: One level Home Equipment: Walker - 2 wheels;Shower seat      Prior Function Level of Independence: Independent               Hand Dominance        Extremity/Trunk Assessment   Upper Extremity Assessment: Defer to OT evaluation           Lower Extremity Assessment: LLE deficits/detail   LLE Deficits / Details: AROM WFL, Sensation intact, Strength grossly 3+/5.  Cervical / Trunk Assessment: Kyphotic  Communication   Communication: No difficulties  Cognition Arousal/Alertness: Awake/alert Behavior During Therapy: WFL for tasks assessed/performed Overall Cognitive Status: Within Functional Limits for tasks assessed                      General Comments      Exercises Total Joint Exercises Quad Sets:  AROM;Both;15 reps Hip ABduction/ADduction: AROM;Both;10 reps Long Arc Quad: AROM;Both;10 reps Marching in Standing: AROM;Both;15 reps;Seated   Assessment/Plan    PT Assessment Patient needs continued PT services  PT Problem List Decreased strength;Decreased activity tolerance;Decreased balance;Decreased mobility;Decreased knowledge of use of DME;Pain          PT Treatment Interventions DME instruction;Gait training;Stair training;Functional mobility  training;Therapeutic activities;Therapeutic exercise;Balance training;Patient/family education    PT Goals (Current goals can be found in the Care Plan section)  Acute Rehab PT Goals Patient Stated Goal: Walk more PT Goal Formulation: With patient Time For Goal Achievement: 04/04/16 Potential to Achieve Goals: Good    Frequency 7X/week   Barriers to discharge        Co-evaluation               End of Session Equipment Utilized During Treatment: Gait belt Activity Tolerance: Patient tolerated treatment well Patient left: in chair;with call bell/phone within reach Nurse Communication: Mobility status         Time: 7414-2395 PT Time Calculation (min) (ACUTE ONLY): 17 min   Charges:   PT Evaluation $PT Eval Moderate Complexity: 1 Procedure     PT G CodesCatarina Hartshorn, Ben[place]ides 03/28/2016, 9:15 AM

## 2016-03-28 NOTE — Anesthesia Postprocedure Evaluation (Signed)
Anesthesia Post Note  Patient: Mary Merritt  Procedure(s) Performed: Procedure(s) (LRB): LEFT TOTAL HIP ARTHROPLASTY ANTERIOR APPROACH (Left)  Patient location during evaluation: PACU Anesthesia Type: Spinal Level of consciousness: oriented and awake and alert Pain management: pain level controlled Vital Signs Assessment: post-procedure vital signs reviewed and stable Respiratory status: spontaneous breathing, respiratory function stable and patient connected to nasal cannula oxygen Cardiovascular status: blood pressure returned to baseline and stable Postop Assessment: no headache and no backache Anesthetic complications: no    Last Vitals:  Vitals:   03/28/16 0020 03/28/16 0442  BP: (!) 143/62 (!) 134/48  Pulse: 75 69  Resp:    Temp: 36.3 C 37 C    Last Pain:  Vitals:   03/28/16 0645  TempSrc:   PainSc: [place]

## 2016-03-28 NOTE — Clinical Social Work Note (Signed)
Clinical Social Work Assessment  Patient Details  Name: Mary Merritt MRN: [id number] Date of Birth: 01/01/1928  Date of referral:  03/28/16               Reason for consult:  Facility Placement                Permission sought to share information with:  Guardian Permission granted to share information::  Yes, Verbal Permission Granted  Name::     [name]  Agency::     Relationship::  Son  Contact Information:  [phone]  Housing/Transportation Living arrangements for the past 2 months:  [place] of Information:  Patient, Adult Children Patient Interpreter Needed:  None Criminal Activity/Legal Involvement Pertinent to Current Situation/Hospitalization:  No - Comment as needed Significant Relationships:  Adult Children Lives with:  Self Do you feel safe going back to the place where you live?  Yes Need for family participation in patient care:  Yes (Comment)  Care giving concerns:  CSW met with pt and pt's son at bedside. Pt's son states pt will have someone in the home with pt 24/7 following discharge. Pt's son has no further concerns at this time.   Social Worker assessment / plan: CSW spoke with patient and patient's son at bedside. Patient was alert and oriented. Patient was living alone prior to admission.  Patient plans to return home with home health PT when medically stable for discharge. CSW will continue to provide support and will facility any PT discharge needs. CSW signing off at this time.   Employment status:  Retired Forensic scientist:  Programmer, applications ([employer]) PT Recommendations:  Home with [place] / Referral to community resources:  Man  Patient/Family's Response to care:   Patient and patients son verbalize understanding of CSW role and appreciation of support.   Patient/Family's Understanding of and Emotional Response to Diagnosis, Current Treatment, and Prognosis:  Patient and  patients son realistic about patients current physical limitations and medical needs. Pt's son states there will be someone to assist patient in the home 24/7 following discharge.   Emotional Assessment Appearance:  Appears stated age Attitude/Demeanor/Rapport:   (Patient was appropriate.) Affect (typically observed):  Accepting, Pleasant, Appropriate Orientation:  Oriented to  Time, Oriented to Situation, Oriented to Place, Oriented to Self Alcohol / Substance use:  Not Applicable Psych involvement (Current and /or in the community):  No (Comment)  Discharge Needs  Concerns to be addressed:  No discharge needs identified Readmission within the last 30 days:  Yes Current discharge risk:  Dependent with Mobility Barriers to Discharge:  Continued Medical Work up   [employer], LCSW 03/28/2016, 10:49 AM

## 2016-03-28 NOTE — Evaluation (Signed)
Occupational Therapy Evaluation Patient Details Name: Mary Merritt MRN: [id number] DOB: 1928/01/03 Today's Date: 03/28/2016    History of Present Illness pt presents post L THR.  pt with hx of R THR, Anxiety, HTN, and AAA Repair.     Clinical Impression   Pt is at mod A level with LB ADLs and sup - on min guard A with ADL mobility. Pt with previous ortho surgeries and has necessary DME at home as well as she is familiar with LB ADL techniques for safety. Pt will have 24 hour sup/assist for her children. All education completed and no further acute OT indicated    Follow Up Recommendations  No OT follow up;Supervision - Intermittent    Equipment Recommendations  Other (comment) (reacherm sock aid)    Recommendations for Other Services       Precautions / Restrictions Precautions Precautions: Fall Restrictions Weight Bearing Restrictions: Yes LLE Weight Bearing: Weight bearing as tolerated      Mobility Bed Mobility               General bed mobility comments: pt sitting in recliner  Transfers Overall transfer level: Needs assistance Equipment used: Rolling walker (2 wheeled) Transfers: Sit to/from Stand Sit to Stand: Supervision         General transfer comment: pt demonstrates good technique with use of UEs and good awareness of safety.    Balance     Sitting balance-Leahy Scale: Good       Standing balance-Leahy Scale: Poor                              ADL Overall ADL's : Needs assistance/impaired     Grooming: Wash/dry hands;Wash/dry face;Min guard;Standing   Upper Body Bathing: Set up   Lower Body Bathing: Moderate assistance   Upper Body Dressing : Set up   Lower Body Dressing: Moderate assistance   Toilet Transfer: Supervision/safety;RW;Comfort height toilet;Ambulation   Toileting- Clothing Manipulation and Hygiene: Minimal assistance;Sit to/from stand   Tub/ Shower Transfer: Min guard;Supervision/safety;3 in  1;Ambulation;Grab bars   Functional mobility during ADLs: Supervision/safety;Min guard General ADL Comments: pt and her son educated on use of ADL A/E for home use     Vision Vision Assessment?: No apparent visual deficits          Pertinent Vitals/Pain Pain Assessment: 0-10 Pain Score: 5  Pain Location: L hip Pain Descriptors / Indicators: Sore Pain Intervention(s): Monitored during session;Premedicated before session;Repositioned;Ice applied     Hand Dominance Right   Extremity/Trunk Assessment Upper Extremity Assessment Upper Extremity Assessment: Overall WFL for tasks assessed   Lower Extremity Assessment Lower Extremity Assessment: Defer to PT evaluation   Cervical / Trunk Assessment Cervical / Trunk Assessment: Kyphotic   Communication Communication Communication: No difficulties   Cognition Arousal/Alertness: Awake/alert Behavior During Therapy: WFL for tasks assessed/performed Overall Cognitive Status: Within Functional Limits for tasks assessed                     General Comments   pt very pleasant and cooperative                 Home Living Family/patient expects to be discharged to:: Private residence Living Arrangements: Alone Available Help at Discharge: Family;Available 24 hours/day Type of Home: House Home Access: Stairs to enter [employer] of Steps: 3 Entrance Stairs-Rails: Right Home Layout: One level     Bathroom Shower/Tub: Walk-in shower  Bathroom Toilet: Handicapped height     Home Equipment: Environmental consultant - 2 wheels;Shower seat          Prior Functioning/Environment Level of Independence: Independent                 OT Problem List: Pain;Decreased activity tolerance   OT Treatment/Interventions:      OT Goals(Current goals can be found in the care plan section) Acute Rehab OT Goals Patient Stated Goal: Walk more  OT Frequency:     Barriers to D/C:    no barriers, son and daughter will take  turns assisting                      End of Session Equipment Utilized During Treatment: Gait belt;Rolling walker;Other (comment) (3 in 1)  Activity Tolerance: Patient tolerated treatment well Patient left: in chair;with call bell/phone within reach;with family/visitor present   Time: 5701-7793 OT Time Calculation (min): 37 min Charges:  OT General Charges $OT Visit: 1 Procedure OT Evaluation $OT Eval Moderate Complexity: 1 Procedure OT Treatments $Self Care/Home Management : 8-22 mins $Therapeutic Activity: 8-22 mins     Britt Bottom 03/28/2016, 1:53 PM

## 2016-03-28 NOTE — Progress Notes (Addendum)
   Subjective:  Patient reports pain as mild.    Objective:   VITALS:   Vitals:   03/27/16 1836 03/27/16 1938 03/28/16 0020 03/28/16 0442  BP: (!) 164/68 (!) 146/52 (!) 143/62 (!) 134/48  Pulse: 66 71 75 69  Resp:  15    Temp: 97.6 F (36.4 C) 97.4 F (36.3 C) 97.4 F (36.3 C) 98.6 F (37 C)  TempSrc: Axillary Oral Oral Oral  SpO2: 100% 98% 98% 99%  Weight:      Height:        Neurologically intact Neurovascular intact Sensation intact distally Intact pulses distally Dorsiflexion/Plantar flexion intact Incision: dressing C/D/I and no drainage No cellulitis present Compartment soft   Lab Results  Component Value Date   WBC 9.5 03/28/2016   HGB 11.5 (L) 03/28/2016   HCT 34.7 (L) 03/28/2016   MCV 88.3 03/28/2016   PLT 211 03/28/2016     Assessment/Plan:  1 Day Post-Op   - Expected postop acute blood loss anemia - will monitor for symptoms - Up with PT/OT - DVT ppx - SCDs, ambulation, aspirin - WBAT operative extremity - Pain control - Discharge planning - she wishes to go home with HHPT  Marianna Payment 03/28/2016, 7:57 AM [phone]

## 2016-03-28 NOTE — Progress Notes (Signed)
Physical Therapy Treatment Patient Details Name: Mary Merritt MRN: [id number] DOB: [birth year] Today's Date: 03/28/2016    History of Present Illness pt presents post L THR.  pt with hx of R THR, Anxiety, HTN, and AAA Repair.      PT Comments    Patient continues to progress well toward mobility goals. Pt tolerated increased gait distance and stair training this session with no increase in pain. Current plan remains appropriate.   Follow Up Recommendations  Home health PT;Supervision/Assistance - 24 hour     Equipment Recommendations  None recommended by PT    Recommendations for Other Services       Precautions / Restrictions Precautions Precautions: Fall Restrictions Weight Bearing Restrictions: Yes LLE Weight Bearing: Weight bearing as tolerated    Mobility  Bed Mobility Overal bed mobility: Needs Assistance Bed Mobility: Supine to Sit;Sit to Supine     Supine to sit: Supervision Sit to supine: Min assist   General bed mobility comments: assist to bring L LE into bed; cues for technique  Transfers Overall transfer level: Needs assistance Equipment used: Rolling walker (2 wheeled) Transfers: Sit to/from Stand Sit to Stand: Supervision         General transfer comment: cues for hand placement  Ambulation/Gait Ambulation/Gait assistance: Supervision Ambulation Distance (Feet): 320 Feet Assistive device: Rolling walker (2 wheeled) Gait Pattern/deviations: Step-through pattern;Decreased stride length     General Gait Details: cues for posture initially; pt with steady gait; worked on step length symmetry; no increase in pain with mobility   Stairs Stairs: Yes Stairs assistance: Min guard Stair Management: One rail Left;Sideways Number of Stairs: 3 General stair comments: cues for sequencing/technique; L hand rail to simulate home  Wheelchair Mobility    Modified Rankin (Stroke Patients Only)       Balance     Sitting balance-Leahy Scale:  Good       Standing balance-Leahy Scale: Fair                      Cognition Arousal/Alertness: Awake/alert Behavior During Therapy: WFL for tasks assessed/performed Overall Cognitive Status: Within Functional Limits for tasks assessed                      Exercises      General Comments        Pertinent Vitals/Pain Pain Assessment: 0-10 Pain Score: 4  Pain Location: L hip Pain Descriptors / Indicators: Burning;Sore Pain Intervention(s): Monitored during session;Premedicated before session;Repositioned    Home Living Family/patient expects to be discharged to:: Private residence Living Arrangements: Alone Available Help at Discharge: Family;Available 24 hours/day Type of Home: House Home Access: Stairs to enter Entrance Stairs-Rails: Right Home Layout: One level Home Equipment: Walker - 2 wheels;Shower seat      Prior Function Level of Independence: Independent          PT Goals (current goals can now be found in the care plan section) Acute Rehab PT Goals Patient Stated Goal: go home Progress towards PT goals: Progressing toward goals    Frequency    7X/week      PT Plan Current plan remains appropriate    Co-evaluation             End of Session Equipment Utilized During Treatment: Gait belt Activity Tolerance: Patient tolerated treatment well Patient left: with call bell/phone within reach;in bed;with family/visitor present     Time: 1400-1432 PT Time Calculation (min) (ACUTE ONLY): 32 min  Charges:  $Gait Training: 8-22 mins $Therapeutic Activity: 8-22 mins                    G Codes:      [place] April, PTA Pager: [phone]   03/28/2016, 2:43 PM

## 2016-03-29 LAB — CBC
HCT: 34.3 % — ABNORMAL LOW (ref 36.0–46.0)
Hemoglobin: 11.6 g/dL — ABNORMAL LOW (ref 12.0–15.0)
MCH: 29.4 pg (ref 26.0–34.0)
MCHC: 33.8 g/dL (ref 30.0–36.0)
MCV: 87.1 fL (ref 78.0–100.0)
PLATELETS: 218 10*3/uL (ref 150–400)
RBC: 3.94 MIL/uL (ref 3.87–5.11)
RDW: 13.1 % (ref 11.5–15.5)
WBC: 17.8 10*3/uL — AB (ref 4.0–10.5)

## 2016-03-29 NOTE — Progress Notes (Signed)
Patient discharged to home with son. Was provided with discharged education. Pt demonstrated understanding of education.     Paulla Fore, RN

## 2016-03-29 NOTE — Progress Notes (Signed)
   Subjective:  Patient reports pain as mild.    Objective:   VITALS:   Vitals:   03/28/16 0442 03/28/16 1300 03/28/16 2053 03/29/16 0500  BP: (!) 134/48 (!) 152/51 (!) 129/46 (!) 119/47  Pulse: 69 61 61 (!) 57  Resp:    17  Temp: 98.6 F (37 C) 98.8 F (37.1 C) 98 F (36.7 C) 98.5 F (36.9 C)  TempSrc: Oral Oral Oral Oral  SpO2: 99% 95% 96% 96%  Weight:      Height:        Neurologically intact Neurovascular intact Sensation intact distally Intact pulses distally Dorsiflexion/Plantar flexion intact Incision: dressing C/D/I and no drainage No cellulitis present Compartment soft   Lab Results  Component Value Date   WBC 17.8 (H) 03/29/2016   HGB 11.6 (L) 03/29/2016   HCT 34.3 (L) 03/29/2016   MCV 87.1 03/29/2016   PLT 218 03/29/2016     Assessment/Plan:  2 Days Post-Op   - home after PT today - has done very well with pain and PT  Macen Joslin Ephriam Jenkins 03/29/2016, 6:57 AM [phone]

## 2016-03-29 NOTE — Care Management Note (Signed)
Case Management Note  Patient Details  Name: Mary Merritt MRN: [id number] Date of Birth: Oct 06, 1927  Subjective/Objective:   82 yr old female s/p left total hip arthroplasty.                 Action/Plan: Patient preoperatively setup with [facility]), no changes. Patient has rolling walker and chower chair at home, will have family support.    Expected Discharge Date:   03/29/16               Expected Discharge Plan:  [place]  In-House Referral:     Discharge planning Services  CM Consult  Post Acute Care Choice:    Choice offered to:  Patient  DME Arranged:  N/A DME Agency:     HH Arranged:  PT [place]:  [facility] ([facility])  Status of Service:  Completed, signed off  If discussed at [place] of Stay Meetings, dates discussed:    Additional Comments:  Ninfa Meeker, RN 03/29/2016, 11:30 AM

## 2016-03-29 NOTE — Plan of Care (Signed)
Problem: Safety: Goal: Ability to remain free from injury will improve Outcome: Progressing No fall or injury noted,    Problem: Pain Managment: Goal: General experience of comfort will improve Outcome: Progressing Medicated twice for pain with full relief  Problem: Physical Regulation: Goal: Ability to maintain clinical measurements within normal limits will improve Outcome: Progressing Patient ambulated around the unit with her daughter's assistance using the walker, she tolerated very well  Problem: Tissue Perfusion: Goal: Risk factors for ineffective tissue perfusion will decrease Outcome: Progressing Denies S/S of DVT  Problem: Bowel/Gastric: Goal: Will not experience complications related to bowel motility Outcome: Progressing No bowel issues noted

## 2016-03-29 NOTE — Progress Notes (Signed)
Physical Therapy Treatment Patient Details Name: Mary Merritt MRN: [id number] DOB: 1927-06-25 Today's Date: 03/29/2016    History of Present Illness pt presents post L THR.  pt with hx of R THR, Anxiety, HTN, and AAA Repair.      PT Comments    Patient is making good progress with PT.  From a mobility standpoint anticipate patient will be ready for DC home with assist from family.     Follow Up Recommendations  Home health PT;Supervision/Assistance - 24 hour     Equipment Recommendations  None recommended by PT    Recommendations for Other Services       Precautions / Restrictions Precautions Precautions: Fall Restrictions Weight Bearing Restrictions: Yes LLE Weight Bearing: Weight bearing as tolerated    Mobility  Bed Mobility               General bed mobility comments: Pt OOB in chair upon arrival  Transfers Overall transfer level: Needs assistance Equipment used: Rolling walker (2 wheeled) Transfers: Sit to/from Stand Sit to Stand: Supervision         General transfer comment: supervision for safety  Ambulation/Gait Ambulation/Gait assistance: Supervision Ambulation Distance (Feet): 400 Feet Assistive device: Rolling walker (2 wheeled) Gait Pattern/deviations: Step-through pattern     General Gait Details: pt with improved gait mechanics and ability to WB on L LE; steady gait   Stairs            Wheelchair Mobility    Modified Rankin (Stroke Patients Only)       Balance     Sitting balance-Leahy Scale: Good       Standing balance-Leahy Scale: Fair                      Cognition Arousal/Alertness: Awake/alert Behavior During Therapy: WFL for tasks assessed/performed Overall Cognitive Status: Within Functional Limits for tasks assessed                      Exercises Total Joint Exercises Quad Sets: AROM;Both;15 reps Heel Slides: AROM;Left;15 reps Hip ABduction/ADduction: AROM;Left;15  reps;Seated;Standing Long Arc Quad: AROM;Left;15 reps;Seated Knee Flexion: AROM;Left;15 reps;Standing    General Comments General comments (skin integrity, edema, etc.): pt given HEP handout      Pertinent Vitals/Pain Pain Location: L hip Pain Descriptors / Indicators: Discomfort Pain Intervention(s): Monitored during session;Premedicated before session    Home Living                      Prior Function            PT Goals (current goals can now be found in the care plan section) Acute Rehab PT Goals Patient Stated Goal: go home Progress towards PT goals: Progressing toward goals    Frequency    7X/week      PT Plan Current plan remains appropriate    Co-evaluation             End of Session Equipment Utilized During Treatment: Gait belt Activity Tolerance: Patient tolerated treatment well Patient left: with call bell/phone within reach;with family/visitor present (in bathroom)     Time: 9323-5573 PT Time Calculation (min) (ACUTE ONLY): 32 min  Charges:  $Gait Training: 8-22 mins $Therapeutic Exercise: 8-22 mins                    G Codes:      Salina April, PTA Pager: [phone])  [phone]   03/29/2016, 9:57 AM

## 2016-03-31 NOTE — Discharge Summary (Signed)
Physician Discharge Summary      Patient ID: Mary Merritt MRN: [id number] DOB/AGE: 01-24-1928 82 y.o.  Admit date: 03/27/2016 Discharge date: 03/31/2016  Admission Diagnoses:  <principal problem not specified>  Discharge Diagnoses:  Active Problems:   Hip joint replacement status   Past Medical History:  Diagnosis Date  . Anxiety   . Arthritis   . Dysrhythmia    bradycardia  . History of bronchitis   . Hyperlipidemia   . Hypertension   . Seasonal allergies     Surgeries: Procedure(s): LEFT TOTAL HIP ARTHROPLASTY ANTERIOR APPROACH on 03/27/2016   Consultants (if any):   Discharged Condition: Improved  Hospital Course: Mary Merritt is an 82 y.o. female who was admitted 03/27/2016 with a diagnosis of <principal problem not specified> and went to the operating room on 03/27/2016 and underwent the above named procedures.    She was given perioperative antibiotics:  Anti-infectives    Start     Dose/Rate Route Frequency Ordered Stop   03/27/16 2000  ceFAZolin (ANCEF) IVPB 2g/100 mL premix     2 g 200 mL/hr over 30 Minutes Intravenous Every 6 hours 03/27/16 1837 03/28/16 0249   03/27/16 1100  ceFAZolin (ANCEF) IVPB 2g/100 mL premix     2 g 200 mL/hr over 30 Minutes Intravenous To ShortStay [facility] 03/27/16 1051 03/27/16 1310    .  She was given sequential compression devices, early ambulation, and aspirin for DVT prophylaxis.  She benefited maximally from the hospital stay and there were no complications.    Recent vital signs:  Vitals:   03/29/16 1020 03/29/16 1218  BP: (!) 128/51 (!) 150/45  Pulse: (!) 55 (!) 54  Resp:  16  Temp:  98.6 F (37 C)    Recent laboratory studies:  Lab Results  Component Value Date   HGB 11.6 (L) 03/29/2016   HGB 11.5 (L) 03/28/2016   HGB 14.0 03/18/2016   Lab Results  Component Value Date   WBC 17.8 (H) 03/29/2016   PLT 218 03/29/2016   Lab Results  Component Value Date   INR 1.07 03/18/2016   Lab Results    Component Value Date   NA 133 (L) 03/28/2016   K 3.1 (L) 03/28/2016   CL 98 (L) 03/28/2016   CO2 27 03/28/2016   BUN 9 03/28/2016   CREATININE 0.88 03/28/2016   GLUCOSE 117 (H) 03/28/2016    Discharge Medications:     Medication List    STOP taking these medications   aspirin 81 MG tablet Replaced by:  aspirin EC 325 MG tablet     TAKE these medications   amLODipine 5 MG tablet Commonly known as:  NORVASC Take 1 tablet (5 mg total) by mouth daily. What changed:  when to take this   aspirin EC 325 MG tablet Take 1 tablet (325 mg total) by mouth 2 (two) times daily. Replaces:  aspirin 81 MG tablet   baclofen 10 MG tablet Commonly known as:  LIORESAL Take 1 tablet (10 mg total) by mouth 3 (three) times daily as needed for muscle spasms.   cetirizine 10 MG tablet Commonly known as:  ZYRTEC Take 10 mg by mouth at bedtime.   Fish Oil 1000 MG Caps Take 2,000 mg by mouth at bedtime.   fluticasone 50 MCG/ACT nasal spray Commonly known as:  FLONASE Place 1 spray into the nose at bedtime as needed for allergies.   hydrochlorothiazide 25 MG tablet Commonly known as:  HYDRODIURIL Take 25 mg by  mouth daily.   HYDROcodone-acetaminophen 7.5-325 MG tablet Commonly known as:  NORCO Take 1-2 tablets by mouth every 6 (six) hours as needed for moderate pain.   ibuprofen 200 MG tablet Commonly known as:  ADVIL,MOTRIN Take 400 mg by mouth daily as needed for moderate pain.   lovastatin 20 MG tablet Commonly known as:  MEVACOR Take 20 mg by mouth at bedtime.   metoprolol tartrate 25 MG tablet Commonly known as:  LOPRESSOR Take 1 tablet (25 mg total) by mouth 2 (two) times daily.   ondansetron 4 MG tablet Commonly known as:  ZOFRAN Take 1-2 tablets (4-8 mg total) by mouth every 8 (eight) hours as needed for nausea or vomiting.   senna-docusate 8.6-50 MG tablet Commonly known as:  SENOKOT S Take 1 tablet by mouth at bedtime as needed.       Diagnostic Studies: Dg  Chest 2 View  Result Date: 03/13/2016 CLINICAL DATA:  Chest pain for 1 day. EXAM: CHEST  2 VIEW COMPARISON:  06/03/2014 FINDINGS: The cardiac silhouette, mediastinal and hilar contours are within normal limits and stable. There is moderate tortuosity and calcification of the thoracic aorta. The lungs are clear. No pleural effusion. The bony thorax is intact. Remote healed rib fractures are again demonstrated. IMPRESSION: No acute cardiopulmonary findings. Electronically Signed   By: Marijo Sanes M.D.   On: 03/13/2016 19:10   Dg Pelvis Portable  Result Date: 03/27/2016 CLINICAL DATA:  Hip joint replacement EXAM: PORTABLE PELVIS 1-2 VIEWS COMPARISON:  08/01/2014 FINDINGS: Interval placement of femoral and acetabular components of left hip arthroplasty, projecting in expected location. Cerclage wire around the proximal left femoral shaft. Right hip arthroplasty components stable. Negative for fracture or dislocation. Degenerative changes suggested in the visualized lower lumbar spine. Extensive iliofemoral arterial calcifications. IMPRESSION: 1. Interval left hip arthroplasty without apparent complication. Electronically Signed   By: Lucrezia Europe M.D.   On: 03/27/2016 16:18   Dg C-arm 61-120 Min  Result Date: 03/27/2016 CLINICAL DATA:  Anterior approach left hip replacement. EXAM: OPERATIVE LEFT HIP (WITH PELVIS IF PERFORMED) ONE VIEW TECHNIQUE: Fluoroscopic spot image(s) were submitted for interpretation post-operatively. COMPARISON:  Pelvis radiographs 08/01/2014 FINDINGS: Two frontal projection intraoperative spot fluoroscopic images are provided centered over the pubic symphysis and left femur. These demonstrate interval performance of a left-sided total hip arthroplasty, with the prosthetic components appearing normally located on this single projection. A right total hip arthroplasty is partially visualized. Vascular calcifications are noted. IMPRESSION: Intraoperative images during left total hip  arthroplasty. Electronically Signed   By: Logan Bores M.D.   On: 03/27/2016 15:59   Dg Hip Operative Unilat With Pelvis Left  Result Date: 03/27/2016 CLINICAL DATA:  Anterior approach left hip replacement. EXAM: OPERATIVE LEFT HIP (WITH PELVIS IF PERFORMED) ONE VIEW TECHNIQUE: Fluoroscopic spot image(s) were submitted for interpretation post-operatively. COMPARISON:  Pelvis radiographs 08/01/2014 FINDINGS: Two frontal projection intraoperative spot fluoroscopic images are provided centered over the pubic symphysis and left femur. These demonstrate interval performance of a left-sided total hip arthroplasty, with the prosthetic components appearing normally located on this single projection. A right total hip arthroplasty is partially visualized. Vascular calcifications are noted. IMPRESSION: Intraoperative images during left total hip arthroplasty. Electronically Signed   By: Logan Bores M.D.   On: 03/27/2016 15:59    Disposition: 01-Home or Self Care  Discharge Instructions    Call MD / Call 911    Complete by:  As directed    If you experience chest pain or shortness  of breath, CALL 911 and be transported to the hospital emergency room.  If you develope a fever above 101.5 F, pus (white drainage) or increased drainage or redness at the wound, or calf pain, call your surgeon's office.   Constipation Prevention    Complete by:  As directed    Drink plenty of fluids.  Prune juice may be helpful.  You may use a stool softener, such as Colace (over the counter) 100 mg twice a day.  Use MiraLax (over the counter) for constipation as needed.   Diet - low sodium heart healthy    Complete by:  As directed    Diet general    Complete by:  As directed    Driving restrictions    Complete by:  As directed    No driving while taking narcotic pain meds.   Increase activity slowly as tolerated    Complete by:  As directed       Follow-up Information    Naiping Ephriam Jenkins, MD Follow up in 2 week(s).     Specialty:  Orthopedic Surgery Why:  For suture removal, For wound re-check Contact information: [place] [address] [phone]        [facility] .   Why:  Someone from [facility] (  someone from [facility]) will contact you to arrange dtart date and time for therapy. Contact information: [address] [place] [address] [phone]            Signed: Leandrew Koyanagi 03/31/2016, 7:06 PM

## 2016-04-09 ENCOUNTER — Ambulatory Visit (INDEPENDENT_AMBULATORY_CARE_PROVIDER_SITE_OTHER): Payer: Medicare Other | Admitting: Orthopaedic Surgery

## 2016-04-09 DIAGNOSIS — M1612 Unilateral primary osteoarthritis, left hip: Secondary | ICD-10-CM | POA: Diagnosis not present

## 2016-05-02 ENCOUNTER — Ambulatory Visit (INDEPENDENT_AMBULATORY_CARE_PROVIDER_SITE_OTHER): Payer: Medicare Other | Admitting: Orthopaedic Surgery

## 2016-05-02 ENCOUNTER — Encounter (INDEPENDENT_AMBULATORY_CARE_PROVIDER_SITE_OTHER): Payer: Self-pay | Admitting: Orthopaedic Surgery

## 2016-05-02 ENCOUNTER — Ambulatory Visit (INDEPENDENT_AMBULATORY_CARE_PROVIDER_SITE_OTHER): Payer: Medicare Other

## 2016-05-02 ENCOUNTER — Telehealth (INDEPENDENT_AMBULATORY_CARE_PROVIDER_SITE_OTHER): Payer: Self-pay | Admitting: Orthopaedic Surgery

## 2016-05-02 DIAGNOSIS — Z96642 Presence of left artificial hip joint: Secondary | ICD-10-CM | POA: Diagnosis not present

## 2016-05-02 NOTE — Telephone Encounter (Signed)
Patient called stating that the RX for a muscle relaxer was not at the pharmacy when she went to get it.  Pharmacy: [place]: [phone]

## 2016-05-02 NOTE — Progress Notes (Signed)
Office Visit Note   Patient: Mary Merritt           Date of Birth: 1927-08-25           MRN: [id number] Visit Date: 05/02/2016              Requested by: Katherina Mires, MD [address] [place], [place] [address] PCP: Suzanna Obey, MD   Assessment & Plan: Visit Diagnoses:  1. Status post left hip replacement     Plan: X-rays confirm no interval changes or complications related to the left total hip replacement. Reassurances given. I believe that she may have strained or irritated her iliopsoas tendon. I recommend scheduled ibuprofen for 2 weeks and then as needed. Robaxin was also recommended. I'll like to see her back in 6 weeks with repeat AP pelvis.  Follow-Up Instructions: Return in about 6 weeks (around 06/13/2016) for recheck tha.   Orders:  Orders Placed This Encounter  Procedures  . XR HIP UNILAT W OR W/O PELVIS 2-3 VIEWS LEFT   No orders of the defined types were placed in this encounter.     Procedures: No procedures performed   Clinical Data: No additional findings.   Subjective: Chief Complaint  Patient presents with  . Left Hip - Pain, Injury    Twisted leg    HPI Mary Merritt is 5 weeks status post left total hip replacement with uneventful postoperative course. She did have an episode yesterday where she planted her left foot and twisted to the right and had immediate pain in the groin. The pain has improved with weightbearing. The pain is 3 out of 10. She is already able to ambulate back into the office today without any assistive devices. She wants to get it checked out today. She denies any fevers or chills. Review of Systems   Objective: Vital Signs: There were no vitals taken for this visit.  Physical Exam  Left Hip Exam   Comments:  She has no pain with range of motion of the hip. She does have groin pain with straight leg raise. The surgical incision has healed nicely.      Specialty Comments:  No specialty comments  available.  Imaging: Xr Hip Unilat W Or W/o Pelvis 2-3 Views Left  Result Date: 05/02/2016 Stable left total hip replacement.  No acute findings    PMFS History: Patient Active Problem List   Diagnosis Date Noted  . Hip joint replacement status 03/27/2016  . S/P hip replacement 07/27/2015  . Osteoarthritis of right hip 08/01/2014  . Essential hypertension 05/31/2014  . Hyperlipidemia 05/31/2014  . S/P AAA repair 05/31/2014  . Bradycardia 05/31/2014  . Murmur 05/31/2014   Past Medical History:  Diagnosis Date  . Anxiety   . Arthritis   . Dysrhythmia    bradycardia  . History of bronchitis   . Hyperlipidemia   . Hypertension   . Seasonal allergies     Family History  Problem Relation Age of Onset  . Heart failure Father   . Stroke Mother     Past Surgical History:  Procedure Laterality Date  . ABDOMINAL AORTIC ANEURYSM REPAIR     2009, Dr. Scot Dock  . CARDIAC CATHETERIZATION     patient thinks it was in 09 with dr Scot Dock  . cataracts removed    . INCONTINENCE SURGERY    . TOTAL HIP ARTHROPLASTY Right 08/01/2014   Procedure: RIGHT TOTAL HIP ARTHROPLASTY ANTERIOR APPROACH;  Surgeon: Marianna Payment,  MD;  Location: [facility];  Service: Orthopedics;  Laterality: Right;  . TOTAL HIP ARTHROPLASTY Left 03/27/2016   Procedure: LEFT TOTAL HIP ARTHROPLASTY ANTERIOR APPROACH;  Surgeon: Leandrew Koyanagi, MD;  Location: [place];  Service: Orthopedics;  Laterality: Left;   Social History   Occupational History  . Not on file.   Social History Main Topics  . Smoking status: Former Smoker    Years: 30.00    Quit date: 05/31/1984  . Smokeless tobacco: Never Used  . Alcohol use No  . Drug use: No  . Sexual activity: Not on file

## 2016-05-02 NOTE — Telephone Encounter (Signed)
Which Rx was suppose to be sent in to pharm?

## 2016-05-03 MED ORDER — METHOCARBAMOL 500 MG PO TABS: ORAL_TABLET | ORAL | 0 refills | Status: DC

## 2016-05-03 NOTE — Telephone Encounter (Signed)
Rx sent to pharm

## 2016-05-03 NOTE — Telephone Encounter (Signed)
Robaxin 500 mg bid prn spasm.  She said that she already had the script but never filled it.  We can send it in.  #30.  Thanks.

## 2016-05-07 ENCOUNTER — Ambulatory Visit (INDEPENDENT_AMBULATORY_CARE_PROVIDER_SITE_OTHER): Payer: Medicare Other | Admitting: Orthopaedic Surgery

## 2016-05-08 ENCOUNTER — Telehealth (INDEPENDENT_AMBULATORY_CARE_PROVIDER_SITE_OTHER): Payer: Self-pay | Admitting: Orthopaedic Surgery

## 2016-05-08 NOTE — Telephone Encounter (Signed)
Mary Merritt with [facility] request a call, pt states she was told by Dr Erlinda Hong to but therapy on hold due to an injury and Mary Merritt needs better clarification.

## 2016-05-08 NOTE — Telephone Encounter (Signed)
She's ok to resume PT if her hip feels better now.

## 2016-05-08 NOTE — Telephone Encounter (Signed)
Please advise 

## 2016-05-08 NOTE — Telephone Encounter (Signed)
Called to advise on dr xu's response. LMOM

## 2016-05-20 ENCOUNTER — Telehealth (INDEPENDENT_AMBULATORY_CARE_PROVIDER_SITE_OTHER): Payer: Self-pay | Admitting: Orthopaedic Surgery

## 2016-05-20 NOTE — Telephone Encounter (Signed)
Charisse called from [facility] asking for a verbal order for one Physical Therapy visit for Mary Merritt. The PT had to be placed on hold due to her straining her hip but Charisse thinks she's better now and can handle the PT. Please give her a phone call regarding this.  Charisse's ph# [phone] Thank you.

## 2016-05-21 NOTE — Telephone Encounter (Signed)
See message below °

## 2016-05-22 NOTE — Telephone Encounter (Signed)
Yes she can resume PT.  I thought I had already answered this message

## 2016-05-22 NOTE — Telephone Encounter (Signed)
Please advise 

## 2016-05-27 ENCOUNTER — Telehealth (INDEPENDENT_AMBULATORY_CARE_PROVIDER_SITE_OTHER): Payer: Self-pay | Admitting: Orthopaedic Surgery

## 2016-05-28 NOTE — Telephone Encounter (Signed)
Called charis back to advise

## 2016-05-30 ENCOUNTER — Ambulatory Visit (INDEPENDENT_AMBULATORY_CARE_PROVIDER_SITE_OTHER): Payer: Medicare Other | Admitting: Orthopaedic Surgery

## 2016-05-30 ENCOUNTER — Encounter (INDEPENDENT_AMBULATORY_CARE_PROVIDER_SITE_OTHER): Payer: Self-pay | Admitting: Orthopaedic Surgery

## 2016-05-30 ENCOUNTER — Ambulatory Visit (INDEPENDENT_AMBULATORY_CARE_PROVIDER_SITE_OTHER): Payer: Medicare Other

## 2016-05-30 DIAGNOSIS — Z96642 Presence of left artificial hip joint: Secondary | ICD-10-CM

## 2016-05-30 NOTE — Progress Notes (Signed)
Patient is 9 weeks status post left total hip replacement she is doing great. Her pain in her groin resolved with rest. She is not having any issues. Pelvis x-ray show stable bilateral total hip replacements. At this point patient is doing very well I will see her back in 10 months for her 1 year visit with low AP pelvis x-ray

## 2016-06-19 ENCOUNTER — Other Ambulatory Visit: Payer: Self-pay | Admitting: Internal Medicine

## 2016-06-19 NOTE — Telephone Encounter (Signed)
Rx(s) sent to pharmacy electronically.  

## 2016-07-29 ENCOUNTER — Ambulatory Visit (INDEPENDENT_AMBULATORY_CARE_PROVIDER_SITE_OTHER): Payer: Medicare Other | Admitting: Internal Medicine

## 2016-07-29 ENCOUNTER — Encounter: Payer: Self-pay | Admitting: Internal Medicine

## 2016-07-29 VITALS — BP 163/67 | HR 54 | Ht 64.0 in | Wt 171.0 lb

## 2016-07-29 DIAGNOSIS — Z9889 Other specified postprocedural states: Secondary | ICD-10-CM

## 2016-07-29 DIAGNOSIS — E782 Mixed hyperlipidemia: Secondary | ICD-10-CM | POA: Diagnosis not present

## 2016-07-29 DIAGNOSIS — I10 Essential (primary) hypertension: Secondary | ICD-10-CM | POA: Diagnosis not present

## 2016-07-29 DIAGNOSIS — Z96643 Presence of artificial hip joint, bilateral: Secondary | ICD-10-CM

## 2016-07-29 DIAGNOSIS — Z8679 Personal history of other diseases of the circulatory system: Secondary | ICD-10-CM

## 2016-07-29 DIAGNOSIS — R011 Cardiac murmur, unspecified: Secondary | ICD-10-CM

## 2016-07-29 MED ORDER — AMLODIPINE BESYLATE 5 MG PO TABS: 5.0000 mg | ORAL_TABLET | Freq: Every day | ORAL | 3 refills | Status: DC

## 2016-07-29 MED ORDER — HYDROCHLOROTHIAZIDE 25 MG PO TABS: 25.0000 mg | ORAL_TABLET | Freq: Every day | ORAL | 3 refills | Status: DC

## 2016-07-29 MED ORDER — METOPROLOL TARTRATE 25 MG PO TABS: 25.0000 mg | ORAL_TABLET | Freq: Two times a day (BID) | ORAL | 3 refills | Status: DC

## 2016-07-29 NOTE — Patient Instructions (Signed)
Your physician wants you to follow-up in: ONE YEAR with Dr. Hilty. You will receive a reminder letter in the mail two months in advance. If you don't receive a letter, please call our office to schedule the follow-up appointment.  

## 2016-07-29 NOTE — Progress Notes (Signed)
OFFICE NOTE  Chief Complaint:  No complaints  Primary Care Physician: Suzanna Obey, MD  HPI:  Mary Merritt is an 83 year old female  Kindly referred to me by Dr. Thea Silversmith. She is recently been having hip problems and is in need of hip replacement. Her past medical history significant for hypertension which is been difficult to control, dyslipidemia and  AAA status post repair by Dr. Scot Dock in 2009.  Recently during preoperative evaluation she was found to be bradycardic with a heart rate in the 40s. It is notable that she is on a beta blocker. She's also had difficult to control hypertension. She has not had follow-up with the vascular surgeons for her abdominal aortic aneurysm. She reportedly had stress testing prior to vascular surgery. She is not complaining of any angina but does get some shortness of breath. She is not particularly active due to her hip problem.   Mary Merritt returns today for follow-up. She underwent a nuclear stress test and echocardiogram which showed no significant abnormalities. I've adjusted her medications and reduce her beta blocker, but heart rate still remains in the 60s. She reports a family history of low heart rate. She will need to continue to monitor for any progressive bradycardia.  I saw Mary Merritt back today in the office. Overall she's doing fairly well. She reports some bilateral leg swelling mostly at the ankles. She said this is worse since I increased her Norvasc from 5-10 mg. Pressure still runs a little high. She did undergo successful hip replacement surgery and is walking better. Heart rate is low today at 48 but she is asymptomatic with this. Heart rate is been in the 40s to 50s before. We discussed possibly decreasing her Lopressor however blood pressure remains high.  07/29/2016  Left total hip arthroplasty, previously having undergone right total hip arthroplasty in 2016. She now reports feeling very well with good mobility and no pain. She  denies any significant leg swelling. Recently she's had some elevated heart rates but this was thought to be related to some anxiety. PACs were noted. She is on Lopressor. Heart rate is in the 50s and shows a stable sinus bradycardia with first-degree AV block. She denies any chest pain or shortness of breath.  PMHx:  Past Medical History:  Diagnosis Date  . Anxiety   . Arthritis   . Dysrhythmia    bradycardia  . History of bronchitis   . Hyperlipidemia   . Hypertension   . Seasonal allergies     Past Surgical History:  Procedure Laterality Date  . ABDOMINAL AORTIC ANEURYSM REPAIR     2009, Dr. Scot Dock  . CARDIAC CATHETERIZATION     patient thinks it was in 09 with dr Scot Dock  . cataracts removed    . INCONTINENCE SURGERY    . TOTAL HIP ARTHROPLASTY Right 08/01/2014   Procedure: RIGHT TOTAL HIP ARTHROPLASTY ANTERIOR APPROACH;  Surgeon: Marianna Payment, MD;  Location: [facility];  Service: Orthopedics;  Laterality: Right;  . TOTAL HIP ARTHROPLASTY Left 03/27/2016   Procedure: LEFT TOTAL HIP ARTHROPLASTY ANTERIOR APPROACH;  Surgeon: Leandrew Koyanagi, MD;  Location: [facility];  Service: Orthopedics;  Laterality: Left;    FAMHx:  Family History  Problem Relation Age of Onset  . Heart failure Father   . Stroke Mother     SOCHx:   reports that she quit smoking about 32 years ago. She quit after 30.00 years of use. She has never used smokeless tobacco. She reports  that she does not drink alcohol or use drugs.  ALLERGIES:  Allergies  Allergen Reactions  . Percocet [Oxycodone-Acetaminophen] Other (See Comments)    Makes head feel funny    ROS: Pertinent items noted in HPI and remainder of comprehensive ROS otherwise negative.  HOME MEDS: Current Outpatient Prescriptions  Medication Sig Dispense Refill  . amLODipine (NORVASC) 5 MG tablet Take 1 tablet (5 mg total) by mouth daily. (Patient taking differently: Take 5 mg by mouth at bedtime. ) 30 tablet 11  . aspirin EC 325 MG tablet Take  1 tablet (325 mg total) by mouth 2 (two) times daily. 84 tablet 0  . cetirizine (ZYRTEC) 10 MG tablet Take 10 mg by mouth at bedtime.     . hydrochlorothiazide (HYDRODIURIL) 25 MG tablet Take 25 mg by mouth daily.    Marland Kitchen ibuprofen (ADVIL,MOTRIN) 200 MG tablet Take 400 mg by mouth daily as needed for moderate pain.    Marland Kitchen lovastatin (MEVACOR) 20 MG tablet Take 20 mg by mouth at bedtime.     . metoprolol tartrate (LOPRESSOR) 25 MG tablet TAKE ONE TABLET BY MOUTH TWICE DAILY 180 tablet 0  . Omega-3 Fatty Acids (FISH OIL) 1000 MG CAPS Take 2,000 mg by mouth at bedtime.      No current facility-administered medications for this visit.     LABS/IMAGING: No results found for this or any previous visit (from the past 48 hour(s)). No results found.  VITALS: BP (!) 163/67 (BP Location: Right Arm)   Pulse (!) 54   Ht '5\' 4"'$  (1.626 m)   Wt 171 lb (77.6 kg)   BMI 29.35 kg/m   EXAM: General appearance: alert and no distress Neck: no carotid bruit, no JVD and thyroid not enlarged, symmetric, no tenderness/mass/nodules Lungs: clear to auscultation bilaterally Heart: regular rate and rhythm, S1, S2 normal, no murmur, click, rub or gallop Abdomen: soft, non-tender; bowel sounds normal; no masses,  no organomegaly Extremities: edema Trace to 1+ bilateral ankle edema Pulses: 2+ and symmetric Skin: Skin color, texture, turgor normal. No rashes or lesions Neurologic: Grossly normal Psych: Pleasant  EKG: Sinus bradycardia with first-degree AV block at 54  ASSESSMENT: 1.  History of AAA status post repair in 2009 2.  Hypertension 3.  dyslipidemia 4.  murmur 5. Leg edema 6. Status post bilateral total hip arthroplasty  PLAN: 1.    Mrs. [place] is doing well status post left total hip arthroplasty. She also had previously had her right hip replaced. Blood pressure is elevated today however at home seems to be better control. She denies any palpitations or chest pain. Her leg edema has improved. We'll  refill medications today and follow-up with her annually or sooner as necessary.  Pixie Casino, MD, [facility] Attending Cardiologist [place] 07/29/2016, 2:07 PM

## 2017-04-03 ENCOUNTER — Encounter (INDEPENDENT_AMBULATORY_CARE_PROVIDER_SITE_OTHER): Payer: Self-pay | Admitting: Orthopaedic Surgery

## 2017-04-03 ENCOUNTER — Ambulatory Visit (INDEPENDENT_AMBULATORY_CARE_PROVIDER_SITE_OTHER): Payer: Medicare Other | Admitting: Orthopaedic Surgery

## 2017-04-03 ENCOUNTER — Ambulatory Visit (INDEPENDENT_AMBULATORY_CARE_PROVIDER_SITE_OTHER): Payer: Medicare Other

## 2017-04-03 DIAGNOSIS — Z96642 Presence of left artificial hip joint: Secondary | ICD-10-CM

## 2017-04-03 DIAGNOSIS — M1611 Unilateral primary osteoarthritis, right hip: Secondary | ICD-10-CM | POA: Diagnosis not present

## 2017-04-03 DIAGNOSIS — M1612 Unilateral primary osteoarthritis, left hip: Secondary | ICD-10-CM | POA: Diagnosis not present

## 2017-04-03 NOTE — Progress Notes (Signed)
Office Visit Note   Patient: Mary Merritt           Date of Birth: [birth year]           MRN: [id number] Visit Date: 04/03/2017              Requested by: Katherina Mires, MD [address] [place], [place] [address] PCP: Katherina Mires, MD   Assessment & Plan: Visit Diagnoses:  1. Primary osteoarthritis of right hip   2. Primary osteoarthritis of left hip   3. Presence of left artificial hip joint   4. Status post left hip replacement     Plan: Patient is doing well from her hip replacements. She is encouraged and answered. Follow-up in 2 years for recheck. She'll knee standing AP pelvis on return. I reminded her of her dental prophylaxis.  Follow-Up Instructions: Return in about 2 years (around 04/04/2019).   Orders:  Orders Placed This Encounter  Procedures  . XR Pelvis 1-2 Views   No orders of the defined types were placed in this encounter.     Procedures: No procedures performed   Clinical Data: No additional findings.   Subjective: Chief Complaint  Patient presents with  . Left Hip - Pain, Follow-up    Patient is following up for her total hip replacements. She is 1 year status post left total hip replacement 3 years status post right total hip replacement. She is doing well. She has no real complaints. She has been very active and happy with her hip replacements.    Review of Systems  Constitutional: Negative.   HENT: Negative.   Eyes: Negative.   Respiratory: Negative.   Cardiovascular: Negative.   Endocrine: Negative.   Musculoskeletal: Negative.   Neurological: Negative.   Hematological: Negative.   Psychiatric/Behavioral: Negative.   All other systems reviewed and are negative.    Objective: Vital Signs: There were no vitals taken for this visit.  Physical Exam  Constitutional: She is oriented to person, place, and time. She appears well-developed and well-nourished.  Pulmonary/Chest: Effort normal.  Neurological: She  is alert and oriented to person, place, and time.  Skin: Skin is warm. Capillary refill takes less than 2 seconds.  Psychiatric: She has a normal mood and affect. Her behavior is normal. Judgment and thought content normal.  Nursing note and vitals reviewed.   Ortho Exam Leg lengths are equal. Bilateral hip range of motion is full and painless full healed surgical scars. Specialty Comments:  No specialty comments available.  Imaging: No results found.   PMFS History: Patient Active Problem List   Diagnosis Date Noted  . Presence of left artificial hip joint 05/30/2016  . Hip joint replacement status 03/27/2016  . S/P hip replacement 07/27/2015  . Osteoarthritis of right hip 08/01/2014  . Essential hypertension 05/31/2014  . Mixed hyperlipidemia 05/31/2014  . S/P AAA repair 05/31/2014  . Bradycardia 05/31/2014  . Murmur 05/31/2014   Past Medical History:  Diagnosis Date  . Anxiety   . Arthritis   . Dysrhythmia    bradycardia  . History of bronchitis   . Hyperlipidemia   . Hypertension   . Seasonal allergies     Family History  Problem Relation Age of Onset  . Heart failure Father   . Stroke Mother     Past Surgical History:  Procedure Laterality Date  . ABDOMINAL AORTIC ANEURYSM REPAIR     2009, Dr. Scot Dock  . CARDIAC CATHETERIZATION  patient thinks it was in 09 with dr Scot Dock  . cataracts removed    . INCONTINENCE SURGERY    . TOTAL HIP ARTHROPLASTY Right 08/01/2014   Procedure: RIGHT TOTAL HIP ARTHROPLASTY ANTERIOR APPROACH;  Surgeon: Marianna Payment, MD;  Location: [facility];  Service: Orthopedics;  Laterality: Right;  . TOTAL HIP ARTHROPLASTY Left 03/27/2016   Procedure: LEFT TOTAL HIP ARTHROPLASTY ANTERIOR APPROACH;  Surgeon: Leandrew Koyanagi, MD;  Location: [facility];  Service: Orthopedics;  Laterality: Left;   Social History   Occupational History  . Not on file.   Social History Main Topics  . Smoking status: Former Smoker    Years: 30.00    Quit date:  05/31/1984  . Smokeless tobacco: Never Used  . Alcohol use No  . Drug use: No  . Sexual activity: Not on file

## 2017-07-21 ENCOUNTER — Encounter (INDEPENDENT_AMBULATORY_CARE_PROVIDER_SITE_OTHER): Payer: Self-pay | Admitting: Orthopaedic Surgery

## 2017-07-21 ENCOUNTER — Ambulatory Visit (INDEPENDENT_AMBULATORY_CARE_PROVIDER_SITE_OTHER): Payer: Medicare Other | Admitting: Orthopaedic Surgery

## 2017-07-21 ENCOUNTER — Ambulatory Visit (INDEPENDENT_AMBULATORY_CARE_PROVIDER_SITE_OTHER): Payer: Medicare Other

## 2017-07-21 ENCOUNTER — Other Ambulatory Visit: Payer: Self-pay | Admitting: Internal Medicine

## 2017-07-21 DIAGNOSIS — M25562 Pain in left knee: Secondary | ICD-10-CM

## 2017-07-21 DIAGNOSIS — S82112A Displaced fracture of left tibial spine, initial encounter for closed fracture: Secondary | ICD-10-CM

## 2017-07-21 DIAGNOSIS — G8929 Other chronic pain: Secondary | ICD-10-CM | POA: Insufficient documentation

## 2017-07-21 NOTE — Telephone Encounter (Signed)
Rx request sent to pharmacy.  

## 2017-07-21 NOTE — Progress Notes (Signed)
Office Visit Note   Patient: Mary Merritt           Date of Birth: [birth year]           MRN: [id number] Visit Date: 07/21/2017              Requested by: Katherina Mires, MD [address] [address] [place], [place] [address] PCP: Katherina Mires, MD   Assessment & Plan: Visit Diagnoses:  1. Closed displaced fracture of spine of left tibia, initial encounter     Plan: Impression is left knee tibial spine fracture.  At this point, I believe it is appropriate to place Mary Merritt in a hinged knee brace weightbearing as tolerated.  She will follow-up with [place] in 4 weeks time for repeat evaluation and x-ray.  Follow-Up Instructions: Return in about 4 weeks (around 08/18/2017).   Orders:  Orders Placed This Encounter  Procedures  . XR KNEE 3 VIEW LEFT   No orders of the defined types were placed in this encounter.     Procedures: No procedures performed   Clinical Data: No additional findings.   Subjective: Chief Complaint  Patient presents with  . Left Knee - Pain    HPI Mary Merritt is a pleasant 84 year old female who presents our clinic today with left knee pain.  She states about 2 weeks ago she was moving a big box when the front of her knee hit the box causing excruciating pain.  The pain she has is located to the medial and lateral aspect of the left knee.  This is intermittent sharp shooting in nature.  Worse with walking and turning.  She is not taking any pain medication for this.  No previous history of knee pain cortisone injection or surgical intervention.  Review of Systems as detailed in HPI.  All others reviewed and are negative.   Objective: Vital Signs: There were no vitals taken for this visit.  Physical Exam well-developed well-nourished female in no acute distress.  Alert and oriented x3.  Ortho Exam examination of her left knee reveals a trace effusion.  Range of motion from 0-105 degrees.  Moderate medial joint line tenderness.  Moderate tenderness over  the pes bursa.  No lateral joint line tenderness no tibial spine tenderness.  Moderate patellofemoral crepitus ligaments are stable.  Specialty Comments:  No specialty comments available.  Imaging: Xr Knee 3 View Left  Result Date: 07/21/2017 X-rays of the left knee reveal a tibial spine fracture off of the lateral border without evidence of healing. x-rays also show moderate medial and patellofemoral changes.    PMFS History: Patient Active Problem List   Diagnosis Date Noted  . Chronic pain of left knee 07/21/2017  . Presence of left artificial hip joint 05/30/2016  . Hip joint replacement status 03/27/2016  . S/P hip replacement 07/27/2015  . Osteoarthritis of right hip 08/01/2014  . Essential hypertension 05/31/2014  . Mixed hyperlipidemia 05/31/2014  . S/P AAA repair 05/31/2014  . Bradycardia 05/31/2014  . Murmur 05/31/2014   Past Medical History:  Diagnosis Date  . Anxiety   . Arthritis   . Dysrhythmia    bradycardia  . History of bronchitis   . Hyperlipidemia   . Hypertension   . Seasonal allergies     Family History  Problem Relation Age of Onset  . Heart failure Father   . Stroke Mother     Past Surgical History:  Procedure Laterality Date  . ABDOMINAL AORTIC ANEURYSM REPAIR  2009, Dr. Scot Dock  . CARDIAC CATHETERIZATION     patient thinks it was in 09 with dr Scot Dock  . cataracts removed    . INCONTINENCE SURGERY    . TOTAL HIP ARTHROPLASTY Right 08/01/2014   Procedure: RIGHT TOTAL HIP ARTHROPLASTY ANTERIOR APPROACH;  Surgeon: Marianna Payment, MD;  Location: [facility];  Service: Orthopedics;  Laterality: Right;  . TOTAL HIP ARTHROPLASTY Left 03/27/2016   Procedure: LEFT TOTAL HIP ARTHROPLASTY ANTERIOR APPROACH;  Surgeon: Leandrew Koyanagi, MD;  Location: [facility];  Service: Orthopedics;  Laterality: Left;   Social History   Occupational History  . Not on file  Tobacco Use  . Smoking status: Former Smoker    Years: 30.00    Last attempt to quit: 05/31/1984      Years since quitting: 33.1  . Smokeless tobacco: Never Used  Substance and Sexual Activity  . Alcohol use: No    Alcohol/week: 0.0 oz  . Drug use: No  . Sexual activity: Not on file

## 2017-08-04 ENCOUNTER — Ambulatory Visit: Payer: Medicare Other | Admitting: Internal Medicine

## 2017-08-04 ENCOUNTER — Encounter: Payer: Self-pay | Admitting: Internal Medicine

## 2017-08-04 VITALS — BP 172/74 | HR 49 | Ht 65.0 in | Wt 167.0 lb

## 2017-08-04 DIAGNOSIS — Z8679 Personal history of other diseases of the circulatory system: Secondary | ICD-10-CM

## 2017-08-04 DIAGNOSIS — Z9889 Other specified postprocedural states: Secondary | ICD-10-CM

## 2017-08-04 DIAGNOSIS — E782 Mixed hyperlipidemia: Secondary | ICD-10-CM

## 2017-08-04 DIAGNOSIS — R011 Cardiac murmur, unspecified: Secondary | ICD-10-CM | POA: Diagnosis not present

## 2017-08-04 DIAGNOSIS — I10 Essential (primary) hypertension: Secondary | ICD-10-CM | POA: Diagnosis not present

## 2017-08-04 NOTE — Patient Instructions (Signed)
Your physician wants you to follow-up in: ONE YEAR with Dr. Hilty. You will receive a reminder letter in the mail two months in advance. If you don't receive a letter, please call our office to schedule the follow-up appointment.  

## 2017-08-05 ENCOUNTER — Ambulatory Visit (INDEPENDENT_AMBULATORY_CARE_PROVIDER_SITE_OTHER): Payer: Medicare Other

## 2017-08-05 ENCOUNTER — Encounter (INDEPENDENT_AMBULATORY_CARE_PROVIDER_SITE_OTHER): Payer: Self-pay | Admitting: Orthopaedic Surgery

## 2017-08-05 ENCOUNTER — Ambulatory Visit (INDEPENDENT_AMBULATORY_CARE_PROVIDER_SITE_OTHER): Payer: Medicare Other | Admitting: Orthopaedic Surgery

## 2017-08-05 DIAGNOSIS — M25562 Pain in left knee: Secondary | ICD-10-CM

## 2017-08-05 DIAGNOSIS — G8929 Other chronic pain: Secondary | ICD-10-CM

## 2017-08-05 DIAGNOSIS — S82115D Nondisplaced fracture of left tibial spine, subsequent encounter for closed fracture with routine healing: Secondary | ICD-10-CM

## 2017-08-05 MED ORDER — TRAMADOL HCL 50 MG PO TABS: 50.0000 mg | ORAL_TABLET | Freq: Two times a day (BID) | ORAL | 0 refills | Status: DC | PRN

## 2017-08-05 MED ORDER — VITAMIN D (ERGOCALCIFEROL) 1.25 MG (50000 UNIT) PO CAPS: 50000.0000 [IU] | ORAL_CAPSULE | ORAL | 0 refills | Status: AC

## 2017-08-05 NOTE — Progress Notes (Signed)
Office Visit Note   Patient: Mary Merritt           Date of Birth: [birth year]           MRN: [id number] Visit Date: 08/05/2017              Requested by: Katherina Mires, MD [place] [place], [place] [address] PCP: Katherina Mires, MD   Assessment & Plan: Visit Diagnoses:  1. Nondisplaced fracture of left tibial spine, subsequent encounter for closed fracture with routine healing   2. Chronic pain of left knee     Plan: Impression is displaced left lateral tibial spine fracture without further displacement.  At this point, we are going to have willing to continue using her walker with ambulation due to her left knee instability.  I have given her a prescription for tramadol as well as vitamin D.  She will follow-up with [place] at her regularly scheduled appointment on 08/21/2017.  At that point, we will re-x-ray her left knee and likely start her in physical therapy.  She will call with concerns or questions in the meantime.  Follow-Up Instructions: Return in about 2 weeks (around 08/21/2017).   Orders:  Orders Placed This Encounter  Procedures  . XR KNEE 3 VIEW LEFT  . XR FEMUR MIN 2 VIEWS LEFT  . XR HIP UNILAT W OR W/O PELVIS 2-3 VIEWS LEFT   Meds ordered this encounter  Medications  . traMADol (ULTRAM) 50 MG tablet    Sig: Take 1 tablet (50 mg total) by mouth every 12 (twelve) hours as needed.    Dispense:  30 tablet    Refill:  0    Order Specific Question:   Supervising Provider    Answer:   Leandrew Koyanagi [[id number]]  . Vitamin D, Ergocalciferol, (DRISDOL) 50000 units CAPS capsule    Sig: Take 1 capsule (50,000 Units total) by mouth every 7 (seven) days for 6 doses.    Dispense:  6 capsule    Refill:  0    Order Specific Question:   Supervising Provider    Answer:   Leandrew Koyanagi [[id number]]      Procedures: No procedures performed   Clinical Data: No additional findings.   Subjective: Chief Complaint  Patient presents with  . Left Knee - Pain     HPI Mary Merritt is a pleasant 84 year old female who presents to our clinic today with recurrent left knee pain.  We saw her on 07/21/2017 where she was found to have a displaced tibial spine fracture.  We placed her in a Bledsoe weightbearing as tolerated.  She says this is been uncomfortable so she has not been wearing it.  She was doing well with no pain until yesterday.  While at her PCPs office, her left knee buckled.  She had marked pain to the left thigh and was unable to weight-bear.  She took ibuprofen last night with moderate relief of symptoms.  She feels as though if she walks without her leg in full extension that it will buckle at all times.  She has been utilizing her walker at home.  She comes in today in a wheelchair.  Of note she is status post left total hip replacement 03/27/2016.  Review of Systems well-developed well-nourished female no acute distress.  Alert and oriented x3.   Objective: Vital Signs: There were no vitals taken for this visit.  Physical Exam well-developed well-nourished female in no acute distress.  Alert and oriented x3.  Ortho Exam examination of the left knee reveals a trace effusion.  Minimal joint line tenderness.  Negative logroll.   Specialty Comments:  No specialty comments available.  Imaging: Xr Femur Min 2 Views Left  Result Date: 08/05/2017 X-rays of the femur are negative for fracture  Xr Hip Unilat W Or W/o Pelvis 2-3 Views Left  Result Date: 08/05/2017 X-rays of the left hip are negative for fracture.  She does have a well-seated prosthesis without evidence of subsidence or osteolysis  Xr Knee 3 View Left  Result Date: 08/05/2017 X-rays of the left knee reveals a displaced tibial spine fracture lateral aspect without further displacement compared to images from 07/21/2017    PMFS History: Patient Active Problem List   Diagnosis Date Noted  . Nondisplaced fracture of left tibial spine, subsequent encounter for closed fracture with  routine healing 08/05/2017  . Chronic pain of left knee 07/21/2017  . Presence of left artificial hip joint 05/30/2016  . Hip joint replacement status 03/27/2016  . S/P hip replacement 07/27/2015  . Osteoarthritis of right hip 08/01/2014  . Essential hypertension 05/31/2014  . Mixed hyperlipidemia 05/31/2014  . S/P AAA repair 05/31/2014  . Bradycardia 05/31/2014  . Murmur 05/31/2014   Past Medical History:  Diagnosis Date  . Anxiety   . Arthritis   . Dysrhythmia    bradycardia  . History of bronchitis   . Hyperlipidemia   . Hypertension   . Seasonal allergies     Family History  Problem Relation Age of Onset  . Heart failure Father   . Stroke Mother     Past Surgical History:  Procedure Laterality Date  . ABDOMINAL AORTIC ANEURYSM REPAIR     2009, Dr. Scot Dock  . CARDIAC CATHETERIZATION     patient thinks it was in 09 with dr Scot Dock  . cataracts removed    . INCONTINENCE SURGERY    . TOTAL HIP ARTHROPLASTY Right 08/01/2014   Procedure: RIGHT TOTAL HIP ARTHROPLASTY ANTERIOR APPROACH;  Surgeon: Marianna Payment, MD;  Location: [facility];  Service: Orthopedics;  Laterality: Right;  . TOTAL HIP ARTHROPLASTY Left 03/27/2016   Procedure: LEFT TOTAL HIP ARTHROPLASTY ANTERIOR APPROACH;  Surgeon: Leandrew Koyanagi, MD;  Location: [facility];  Service: Orthopedics;  Laterality: Left;   Social History   Occupational History  . Not on file  Tobacco Use  . Smoking status: Former Smoker    Years: 30.00    Last attempt to quit: 05/31/1984    Years since quitting: 33.2  . Smokeless tobacco: Never Used  Substance and Sexual Activity  . Alcohol use: No    Alcohol/week: 0.0 oz  . Drug use: No  . Sexual activity: Not on file

## 2017-08-06 ENCOUNTER — Encounter: Payer: Self-pay | Admitting: Internal Medicine

## 2017-08-06 NOTE — Progress Notes (Signed)
OFFICE NOTE  Chief Complaint:  Knee pain  Primary Care Physician: Katherina Mires, MD  HPI:  Mary Merritt is an 84 year old female  Kindly referred to me by Dr. Thea Silversmith. She is recently been having hip problems and is in need of hip replacement. Her past medical history significant for hypertension which is been difficult to control, dyslipidemia and  AAA status post repair by Dr. Scot Dock in 2009.  Recently during preoperative evaluation she was found to be bradycardic with a heart rate in the 40s. It is notable that she is on a beta blocker. She's also had difficult to control hypertension. She has not had follow-up with the vascular surgeons for her abdominal aortic aneurysm. She reportedly had stress testing prior to vascular surgery. She is not complaining of any angina but does get some shortness of breath. She is not particularly active due to her hip problem.   Mary Merritt returns today for follow-up. She underwent a nuclear stress test and echocardiogram which showed no significant abnormalities. I've adjusted her medications and reduce her beta blocker, but heart rate still remains in the 60s. She reports a family history of low heart rate. She will need to continue to monitor for any progressive bradycardia.  I saw Mary Merritt back today in the office. Overall she's doing fairly well. She reports some bilateral leg swelling mostly at the ankles. She said this is worse since I increased her Norvasc from 5-10 mg. Pressure still runs a little high. She did undergo successful hip replacement surgery and is walking better. Heart rate is low today at 48 but she is asymptomatic with this. Heart rate is been in the 40s to 50s before. We discussed possibly decreasing her Lopressor however blood pressure remains high.  07/29/2016  Left total hip arthroplasty, previously having undergone right total hip arthroplasty in 2016. She now reports feeling very well with good mobility and no pain. She  denies any significant leg swelling. Recently she's had some elevated heart rates but this was thought to be related to some anxiety. PACs were noted. She is on Lopressor. Heart rate is in the 50s and shows a stable sinus bradycardia with first-degree AV block. She denies any chest pain or shortness of breath.  08/06/2017  Mary Merritt returns today for follow-up.  Overall she seems to be well except she is now complaining of left knee pain.  She apparently underwent hip replacements but now is struggling with knee problems.  She is undergoing injections at [facility].  She denies any chest pain or worsening shortness of breath.  Her blood pressure was markedly elevated today however she reports is better controlled at home.    PMHx:  Past Medical History:  Diagnosis Date  . Anxiety   . Arthritis   . Dysrhythmia    bradycardia  . History of bronchitis   . Hyperlipidemia   . Hypertension   . Seasonal allergies     Past Surgical History:  Procedure Laterality Date  . ABDOMINAL AORTIC ANEURYSM REPAIR     2009, Dr. Scot Dock  . CARDIAC CATHETERIZATION     patient thinks it was in 09 with dr Scot Dock  . cataracts removed    . INCONTINENCE SURGERY    . TOTAL HIP ARTHROPLASTY Right 08/01/2014   Procedure: RIGHT TOTAL HIP ARTHROPLASTY ANTERIOR APPROACH;  Surgeon: Marianna Payment, MD;  Location: [facility];  Service: Orthopedics;  Laterality: Right;  . TOTAL HIP ARTHROPLASTY Left 03/27/2016   Procedure:  LEFT TOTAL HIP ARTHROPLASTY ANTERIOR APPROACH;  Surgeon: Leandrew Koyanagi, MD;  Location: [facility];  Service: Orthopedics;  Laterality: Left;    FAMHx:  Family History  Problem Relation Age of Onset  . Heart failure Father   . Stroke Mother     SOCHx:   reports that she quit smoking about 33 years ago. She quit after 30.00 years of use. she has never used smokeless tobacco. She reports that she does not drink alcohol or use drugs.  ALLERGIES:  Allergies  Allergen Reactions  . Percocet  [Oxycodone-Acetaminophen] Other (See Comments)    Makes head feel funny    ROS: Pertinent items noted in HPI and remainder of comprehensive ROS otherwise negative.  HOME MEDS: Current Outpatient Medications  Medication Sig Dispense Refill  . amLODipine (NORVASC) 5 MG tablet Take 1 tablet (5 mg total) by mouth daily. 90 tablet 3  . aspirin EC 325 MG tablet Take 1 tablet (325 mg total) by mouth 2 (two) times daily. 84 tablet 0  . cetirizine (ZYRTEC) 10 MG tablet Take 10 mg by mouth at bedtime.     . hydrochlorothiazide (HYDRODIURIL) 25 MG tablet Take 1 tablet (25 mg total) by mouth daily. 90 tablet 3  . ibuprofen (ADVIL,MOTRIN) 200 MG tablet Take 400 mg by mouth daily as needed for moderate pain.    Marland Kitchen lovastatin (MEVACOR) 20 MG tablet Take 20 mg by mouth at bedtime.     . metoprolol tartrate (LOPRESSOR) 25 MG tablet TAKE ONE TABLET BY MOUTH TWICE DAILY 180 tablet 3  . Omega-3 Fatty Acids (FISH OIL) 1000 MG CAPS Take 2,000 mg by mouth at bedtime.     . traMADol (ULTRAM) 50 MG tablet Take 1 tablet (50 mg total) by mouth every 12 (twelve) hours as needed. 30 tablet 0  . Vitamin D, Ergocalciferol, (DRISDOL) 50000 units CAPS capsule Take 1 capsule (50,000 Units total) by mouth every 7 (seven) days for 6 doses. 6 capsule 0   No current facility-administered medications for this visit.     LABS/IMAGING: No results found for this or any previous visit (from the past 48 hour(s)). Xr Femur Min 2 Views Left  Result Date: 08/05/2017 X-rays of the femur are negative for fracture  Xr Hip Unilat W Or W/o Pelvis 2-3 Views Left  Result Date: 08/05/2017 X-rays of the left hip are negative for fracture.  She does have a well-seated prosthesis without evidence of subsidence or osteolysis  Xr Knee 3 View Left  Result Date: 08/05/2017 X-rays of the left knee reveals a displaced tibial spine fracture lateral aspect without further displacement compared to images from 07/21/2017   VITALS: BP (!) 172/74    Pulse (!) 49   Ht 5\' 5"  (1.651 m)   Wt 167 lb (75.8 kg)   BMI 27.79 kg/m   EXAM: General appearance: alert and no distress Neck: no carotid bruit, no JVD and thyroid not enlarged, symmetric, no tenderness/mass/nodules Lungs: clear to auscultation bilaterally Heart: regular rate and rhythm, S1, S2 normal, no murmur, click, rub or gallop Abdomen: soft, non-tender; bowel sounds normal; no masses,  no organomegaly Extremities: edema Trace to 1+ bilateral ankle edema Pulses: 2+ and symmetric Skin: Skin color, texture, turgor normal. No rashes or lesions Neurologic: Grossly normal Psych: Pleasant  EKG: Sinus bradycardia with first-degree AV block and PACs at 49  ASSESSMENT: 1.  History of AAA status post repair in 2009 2.  Hypertension 3.  dyslipidemia 4.  murmur 5. Leg edema 6. Status  post bilateral total hip arthroplasty 7. Asymptomatic bradycardia  PLAN: 1.    Mary Merritt denies any new cardiac symptoms but is struggling with knee pain.  It does not sound like she will need knee surgery.  Blood pressure was elevated today however she says it is always high in the doctor's office and she reports is lower at home.  I advised her to keep track of her numbers and contact us with those and we may adjust her medications accordingly.  She is noted to be bradycardic today however is asymptomatic with this.  She is on metoprolol tartrate 25 mg twice daily. This could be decreased if she became symptomatic.  Follow-up with me annually or sooner as necessary.   Pixie Casino, MD, [facility], [place] Director of the [facility]  [facility] Diplomate of the American Board of Clinical Lipidology Attending Cardiologist  Direct Dial: [phone]  Fax: [phone]  Website:  [email or web address] 08/06/2017, 11:23 AM

## 2017-08-21 ENCOUNTER — Ambulatory Visit (INDEPENDENT_AMBULATORY_CARE_PROVIDER_SITE_OTHER): Payer: Medicare Other | Admitting: Orthopaedic Surgery

## 2017-08-21 ENCOUNTER — Ambulatory Visit (INDEPENDENT_AMBULATORY_CARE_PROVIDER_SITE_OTHER): Payer: Medicare Other

## 2017-08-21 ENCOUNTER — Encounter (INDEPENDENT_AMBULATORY_CARE_PROVIDER_SITE_OTHER): Payer: Self-pay | Admitting: Orthopaedic Surgery

## 2017-08-21 DIAGNOSIS — G8929 Other chronic pain: Secondary | ICD-10-CM

## 2017-08-21 DIAGNOSIS — M25562 Pain in left knee: Secondary | ICD-10-CM

## 2017-08-21 DIAGNOSIS — S82115D Nondisplaced fracture of left tibial spine, subsequent encounter for closed fracture with routine healing: Secondary | ICD-10-CM

## 2017-08-21 NOTE — Progress Notes (Signed)
Patient is 6 weeks status post minimally depressed tibial spine fracture.  She is feeling much better now.  Overall she is doing better and improving.  Her activity is improving.  Physical exam shows no joint effusion.  She has very good range of motion.  She does not have any real tenderness over the proximal tibia.  Her x-rays demonstrate a healed fracture.  At this point we will have her come back as needed.  Activity as tolerated.

## 2017-08-27 ENCOUNTER — Other Ambulatory Visit: Payer: Self-pay | Admitting: Internal Medicine

## 2017-08-27 NOTE — Telephone Encounter (Signed)
Already sent.

## 2017-08-27 NOTE — Telephone Encounter (Signed)
°*  STAT* If patient is at the pharmacy, call can be transferred to refill team.   1. Which medications need to be refilled? (please list name of each medication and dose if known) Amlodipine  2. Which pharmacy/location (including street and city if local pharmacy) is medication to be sent to?[facility] [phone]  3. Do they need a 30 day or 90 day supply? 90 and refills

## 2017-09-11 ENCOUNTER — Telehealth: Payer: Self-pay | Admitting: Internal Medicine

## 2017-09-11 NOTE — Telephone Encounter (Signed)
Routed to [place], [place] to follow up on 3/22

## 2017-09-11 NOTE — Telephone Encounter (Signed)
New message   Wants Mary Merritt to call 09/12/17  Patient is suppose to be documenting her bp readings, she refused to give a list, she only wants to talk to [place].  States she is not having any problems

## 2017-09-12 MED ORDER — AMLODIPINE BESYLATE 5 MG PO TABS: 5.0000 mg | ORAL_TABLET | Freq: Two times a day (BID) | ORAL | 11 refills | Status: DC

## 2017-09-12 NOTE — Telephone Encounter (Signed)
Returned call to patient of Dr. Debara Pickett last seen in Feb 2019. She reports she has been checking her BP at home but it made her nervous. She reports her knee is causing her trouble/pain. She has been to Dr. Erlinda Hong (orthopaedics) and was told her she has arthritis in her knee.   Takes amlodipine around 8pm Takes hctz in AM Takes metoprolol BID  3/4 -   7:45pm - 145/62 HR 58 3/5 -   9:15pm - 140/65 HR 58 3/6 -   9:15pm - 135/61 HR 57 3/7 -   9:00pm - 126/53 HR 55 3/7 -   9:15pm - 129/53 HR 56 3/8 -   9:15pm - 133/62 HR 56 3/9 -   9:00pm - 147/61 HR 54 3/11 - 8:45pm - 153/59 HR 58 3/12 - 8:50pm - 144/56 HR 57 3/13 - 9:15pm - 137/54 HR 62 3/14 - 9:20pm - 132/59 HR 57 3/15 - 9:30pm - 159/64 HR 55 3/21 - 9:50pm - 148/63 HR 50  Only took BP twice in AM since her last visit 3/13 - 1-1.5 hrs after meds 161/75 HR 45 3/14 - 10:20am - 170/73 HR 47  Routed to MD to review and advise patient  She has seen [name] in the past for HTN clinic

## 2017-09-12 NOTE — Telephone Encounter (Signed)
Patient aware of MD recommendations. Agrees w/plan. Rx(s) sent to pharmacy electronically.

## 2017-09-12 NOTE — Telephone Encounter (Signed)
Ok to increase amlodipine to 10 mg daily.  Dr. Lemmie Evens

## 2017-10-06 ENCOUNTER — Other Ambulatory Visit: Payer: Self-pay | Admitting: Internal Medicine

## 2017-10-06 ENCOUNTER — Telehealth: Payer: Self-pay | Admitting: Internal Medicine

## 2017-10-06 MED ORDER — AMLODIPINE BESYLATE 5 MG PO TABS: 5.0000 mg | ORAL_TABLET | Freq: Every day | ORAL | 11 refills | Status: DC

## 2017-10-06 NOTE — Telephone Encounter (Signed)
New Message:    Please call,she wants to give you some blood pressure readings.

## 2017-10-06 NOTE — Telephone Encounter (Signed)
Patient aware and verbalized understanding. °

## 2017-10-06 NOTE — Telephone Encounter (Signed)
BP readings look good.  Unfortunately will need to cut back the amlodipine to 5 mg once daily due to increased swelling in her ankles.  Have her continue with BP monitoring 3-4 times per week and let us know in about 3-4 weeks if her pressure has increased since cutting back on the amlodipine.

## 2017-10-06 NOTE — Telephone Encounter (Signed)
Returned call to patient who is reporting BP readings since last medication change 3/22, amlodipine was increased to 5 mg BID.    BP HR 3/22 153/68   52 3/23 136/61   58 3/24 148/67  64 3/25 134/57  49 3/26 145/57  62 3/27 136/62  49 3/28 142/65  43 AM         140/54  51 PM 3/30 139/61  49 4/2   128/54  59 4/3   145/63  56 AM         146/59  51 PM 4/6   145/71  53 AM         139/61  54 PM 4/7   130/53  55 4/10 147/66 59 AM   Patient reports concerned with increased swelling in ankles since medication change as well.    meds:  Amlodipine 5 mg BID HCTZ AM Metoprolol BID   Advised would have pharmD review and return call with further recommendations.  Patient aware.

## 2017-10-30 ENCOUNTER — Telehealth: Payer: Self-pay | Admitting: Internal Medicine

## 2017-10-30 NOTE — Telephone Encounter (Signed)
Pt call with updated BP readings since  medication change on 4/15. Amlodipine was decreased to 5 mg daily.    BP   HR 4/18 142/59 53 4/19 149/66 51 4/22 142/57 65 4/27 136/53 57 5/2 142/66 58 5/4 131/56 51 5/6 137/59 54 5/8 135/60 56  Routed to Pharm D for an update.

## 2017-10-30 NOTE — Telephone Encounter (Signed)
Pt update with recommendation. Verbalized understanding.

## 2017-10-30 NOTE — Telephone Encounter (Signed)
Blood pressure is looking fine for now. Please continue current medication regimens. Follow low sodium diet, and call back if blood pressure persistent > 150.

## 2017-10-30 NOTE — Telephone Encounter (Signed)
New Message:      Pt is wanting a nurse to give her a call back. Pt would not tell me what it is in reference to.

## 2017-11-22 ENCOUNTER — Other Ambulatory Visit: Payer: Self-pay

## 2017-11-22 ENCOUNTER — Observation Stay (HOSPITAL_BASED_OUTPATIENT_CLINIC_OR_DEPARTMENT_OTHER)
Admission: EM | Admit: 2017-11-22 | Discharge: 2017-11-24 | Disposition: A | Discharge status: Home / Self Care | Payer: Medicare Other | Attending: Internal Medicine | Admitting: Internal Medicine

## 2017-11-22 ENCOUNTER — Emergency Department (HOSPITAL_BASED_OUTPATIENT_CLINIC_OR_DEPARTMENT_OTHER): Payer: Medicare Other

## 2017-11-22 ENCOUNTER — Encounter (HOSPITAL_BASED_OUTPATIENT_CLINIC_OR_DEPARTMENT_OTHER): Payer: Self-pay | Admitting: Adult Health

## 2017-11-22 DIAGNOSIS — I110 Hypertensive heart disease with heart failure: Secondary | ICD-10-CM | POA: Insufficient documentation

## 2017-11-22 DIAGNOSIS — I10 Essential (primary) hypertension: Secondary | ICD-10-CM | POA: Diagnosis present

## 2017-11-22 DIAGNOSIS — Z66 Do not resuscitate: Secondary | ICD-10-CM | POA: Insufficient documentation

## 2017-11-22 DIAGNOSIS — Z79899 Other long term (current) drug therapy: Secondary | ICD-10-CM | POA: Insufficient documentation

## 2017-11-22 DIAGNOSIS — Z96643 Presence of artificial hip joint, bilateral: Secondary | ICD-10-CM | POA: Insufficient documentation

## 2017-11-22 DIAGNOSIS — Z7982 Long term (current) use of aspirin: Secondary | ICD-10-CM | POA: Diagnosis not present

## 2017-11-22 DIAGNOSIS — J189 Pneumonia, unspecified organism: Secondary | ICD-10-CM | POA: Diagnosis not present

## 2017-11-22 DIAGNOSIS — R001 Bradycardia, unspecified: Secondary | ICD-10-CM | POA: Insufficient documentation

## 2017-11-22 DIAGNOSIS — E871 Hypo-osmolality and hyponatremia: Principal | ICD-10-CM

## 2017-11-22 DIAGNOSIS — E876 Hypokalemia: Secondary | ICD-10-CM

## 2017-11-22 DIAGNOSIS — E782 Mixed hyperlipidemia: Secondary | ICD-10-CM | POA: Diagnosis not present

## 2017-11-22 DIAGNOSIS — I5032 Chronic diastolic (congestive) heart failure: Secondary | ICD-10-CM | POA: Diagnosis not present

## 2017-11-22 DIAGNOSIS — J181 Lobar pneumonia, unspecified organism: Secondary | ICD-10-CM

## 2017-11-22 DIAGNOSIS — Z87891 Personal history of nicotine dependence: Secondary | ICD-10-CM | POA: Diagnosis not present

## 2017-11-22 LAB — BASIC METABOLIC PANEL
ANION GAP: 10 (ref 5–15)
ANION GAP: 9 (ref 5–15)
ANION GAP: 11 (ref 5–15)
ANION GAP: 11 (ref 5–15)
BUN: 11 mg/dL (ref 6–20)
BUN: 10 mg/dL (ref 6–20)
BUN: 9 mg/dL (ref 6–20)
BUN: 10 mg/dL (ref 6–20)
CALCIUM: 8.3 mg/dL — AB (ref 8.9–10.3)
CALCIUM: 8.5 mg/dL — AB (ref 8.9–10.3)
CO2: 26 mmol/L (ref 22–32)
CO2: 27 mmol/L (ref 22–32)
CO2: 26 mmol/L (ref 22–32)
CO2: 25 mmol/L (ref 22–32)
Calcium: 8.2 mg/dL — ABNORMAL LOW (ref 8.9–10.3)
Calcium: 8.6 mg/dL — ABNORMAL LOW (ref 8.9–10.3)
Chloride: 87 mmol/L — ABNORMAL LOW (ref 101–111)
Chloride: 92 mmol/L — ABNORMAL LOW (ref 101–111)
Chloride: 92 mmol/L — ABNORMAL LOW (ref 101–111)
Chloride: 94 mmol/L — ABNORMAL LOW (ref 101–111)
Creatinine, Ser: 0.69 mg/dL (ref 0.44–1.00)
Creatinine, Ser: 0.68 mg/dL (ref 0.44–1.00)
Creatinine, Ser: 0.74 mg/dL (ref 0.44–1.00)
Creatinine, Ser: 0.6 mg/dL (ref 0.44–1.00)
GFR calc Af Amer: >60 mL/min (ref >60)
GFR calc Af Amer: >60 mL/min (ref >60)
GFR calc Af Amer: >60 mL/min (ref >60)
GLUCOSE: 122 mg/dL — AB (ref 65–99)
Glucose, Bld: 112 mg/dL — ABNORMAL HIGH (ref 65–99)
Glucose, Bld: 111 mg/dL — ABNORMAL HIGH (ref 65–99)
Glucose, Bld: 142 mg/dL — ABNORMAL HIGH (ref 65–99)
POTASSIUM: 3.1 mmol/L — AB (ref 3.5–5.1)
POTASSIUM: 3.6 mmol/L (ref 3.5–5.1)
POTASSIUM: 3.7 mmol/L (ref 3.5–5.1)
Potassium: 3.1 mmol/L — ABNORMAL LOW (ref 3.5–5.1)
SODIUM: 123 mmol/L — AB (ref 135–145)
SODIUM: 128 mmol/L — AB (ref 135–145)
Sodium: 129 mmol/L — ABNORMAL LOW (ref 135–145)
Sodium: 130 mmol/L — ABNORMAL LOW (ref 135–145)

## 2017-11-22 LAB — COMPREHENSIVE METABOLIC PANEL
ALT: 14 U/L (ref 14–54)
ANION GAP: 13 (ref 5–15)
AST: 25 U/L (ref 15–41)
Albumin: 3.5 g/dL (ref 3.5–5.0)
Alkaline Phosphatase: 76 U/L (ref 38–126)
BUN: 14 mg/dL (ref 6–20)
CHLORIDE: 84 mmol/L — AB (ref 101–111)
CO2: 23 mmol/L (ref 22–32)
CREATININE: 0.74 mg/dL (ref 0.44–1.00)
Calcium: 8.5 mg/dL — ABNORMAL LOW (ref 8.9–10.3)
GFR calc Af Amer: >60 mL/min (ref >60)
GFR calc non Af Amer: >60 mL/min (ref >60)
Glucose, Bld: 109 mg/dL — ABNORMAL HIGH (ref 65–99)
POTASSIUM: 2.7 mmol/L — AB (ref 3.5–5.1)
SODIUM: 120 mmol/L — AB (ref 135–145)
Total Bilirubin: 1.1 mg/dL (ref 0.3–1.2)
Total Protein: 6.5 g/dL (ref 6.5–8.1)

## 2017-11-22 LAB — MAGNESIUM: MAGNESIUM: 1.1 mg/dL — AB (ref 1.7–2.4)

## 2017-11-22 LAB — CBC WITH DIFFERENTIAL/PLATELET
BASOS ABS: 0.1 10*3/uL (ref 0.0–0.1)
BASOS PCT: 1 %
EOS ABS: 0.3 10*3/uL (ref 0.0–0.7)
Eosinophils Relative: 3 %
HCT: 38 % (ref 36.0–46.0)
Hemoglobin: 14.1 g/dL (ref 12.0–15.0)
Lymphocytes Relative: 24 %
Lymphs Abs: 2.1 10*3/uL (ref 0.7–4.0)
MCH: 30.6 pg (ref 26.0–34.0)
MCHC: 36.7 g/dL — ABNORMAL HIGH (ref 30.0–36.0)
MCV: 82.4 fL (ref 78.0–100.0)
MONO ABS: 0.9 10*3/uL (ref 0.1–1.0)
Monocytes Relative: 10 %
NEUTROS ABS: 5.6 10*3/uL (ref 1.7–7.7)
NEUTROS PCT: 62 %
PLATELETS: 335 10*3/uL (ref 150–400)
RBC: 4.61 MIL/uL (ref 3.87–5.11)
RDW: 11.6 % (ref 11.5–15.5)
WBC: 9 10*3/uL (ref 4.0–10.5)

## 2017-11-22 LAB — OSMOLALITY, URINE: OSMOLALITY UR: 172 mosm/kg — AB (ref 300–900)

## 2017-11-22 LAB — OSMOLALITY: OSMOLALITY: 259 mosm/kg — AB (ref 275–295)

## 2017-11-22 LAB — SODIUM, URINE, RANDOM: Sodium, Ur: 64 mmol/L

## 2017-11-22 MED ORDER — SODIUM CHLORIDE 0.9 % IV SOLN
1.0000 g | Freq: Once | INTRAVENOUS | Status: AC
  Administered 2017-11-22: 1 g via INTRAVENOUS
  Filled 2017-11-22: qty 10

## 2017-11-22 MED ORDER — LORATADINE 10 MG PO TABS
10.0000 mg | ORAL_TABLET | Freq: Every day | ORAL | Status: DC
Start: 2017-11-22 — End: 2017-11-24
  Administered 2017-11-22 – 2017-11-24 (×3): 10 mg via ORAL
  Filled 2017-11-22 (×3): qty 1

## 2017-11-22 MED ORDER — ENOXAPARIN SODIUM 40 MG/0.4ML ~~LOC~~ SOLN
40.0000 mg | SUBCUTANEOUS | Status: DC
  Administered 2017-11-22 – 2017-11-24 (×3): 40 mg via SUBCUTANEOUS
  Filled 2017-11-22 (×3): qty 0.4

## 2017-11-22 MED ORDER — SODIUM CHLORIDE 0.9 % IV SOLN
INTRAVENOUS | Status: DC
  Administered 2017-11-22: 09:00:00 via INTRAVENOUS

## 2017-11-22 MED ORDER — SODIUM CHLORIDE 0.9 % IV BOLUS
500.0000 mL | Freq: Once | INTRAVENOUS | Status: AC
  Administered 2017-11-22: 500 mL via INTRAVENOUS

## 2017-11-22 MED ORDER — ASPIRIN EC 81 MG PO TBEC
81.0000 mg | DELAYED_RELEASE_TABLET | Freq: Every day | ORAL | Status: DC
  Administered 2017-11-22 – 2017-11-24 (×3): 81 mg via ORAL
  Filled 2017-11-22 (×3): qty 1

## 2017-11-22 MED ORDER — AZITHROMYCIN 250 MG PO TABS: 250.0000 mg | ORAL_TABLET | Freq: Every day | ORAL | Status: DC

## 2017-11-22 MED ORDER — CLONIDINE HCL 0.1 MG PO TABS
0.1000 mg | ORAL_TABLET | Freq: Once | ORAL | Status: AC
  Administered 2017-11-22: 0.1 mg via ORAL
  Filled 2017-11-22: qty 1

## 2017-11-22 MED ORDER — METOPROLOL TARTRATE 25 MG PO TABS: 25.0000 mg | ORAL_TABLET | Freq: Two times a day (BID) | ORAL | Status: DC

## 2017-11-22 MED ORDER — PRAVASTATIN SODIUM 20 MG PO TABS
20.0000 mg | ORAL_TABLET | Freq: Every day | ORAL | Status: DC
  Administered 2017-11-22 – 2017-11-23 (×2): 20 mg via ORAL
  Filled 2017-11-22 (×2): qty 1

## 2017-11-22 MED ORDER — POTASSIUM CHLORIDE CRYS ER 20 MEQ PO TBCR
40.0000 meq | EXTENDED_RELEASE_TABLET | Freq: Once | ORAL | Status: AC
  Administered 2017-11-22: 40 meq via ORAL
  Filled 2017-11-22: qty 2

## 2017-11-22 MED ORDER — HYDRALAZINE HCL 20 MG/ML IJ SOLN: 5.0000 mg | Freq: Four times a day (QID) | INTRAMUSCULAR | Status: DC | PRN

## 2017-11-22 MED ORDER — AMLODIPINE BESYLATE 10 MG PO TABS
10.0000 mg | ORAL_TABLET | Freq: Every day | ORAL | Status: DC
  Administered 2017-11-22 – 2017-11-24 (×3): 10 mg via ORAL
  Filled 2017-11-22 (×3): qty 1

## 2017-11-22 MED ORDER — SODIUM CHLORIDE 0.9 % IV SOLN
500.0000 mg | Freq: Once | INTRAVENOUS | Status: DC
  Filled 2017-11-22: qty 500

## 2017-11-22 MED ORDER — AMLODIPINE BESYLATE 5 MG PO TABS: 5.0000 mg | ORAL_TABLET | Freq: Every day | ORAL | Status: DC

## 2017-11-22 MED ORDER — METOPROLOL TARTRATE 25 MG PO TABS
12.5000 mg | ORAL_TABLET | Freq: Two times a day (BID) | ORAL | Status: DC
  Administered 2017-11-22 – 2017-11-24 (×4): 12.5 mg via ORAL
  Filled 2017-11-22 (×5): qty 1

## 2017-11-22 MED ORDER — POTASSIUM CHLORIDE 10 MEQ/100ML IV SOLN
10.0000 meq | Freq: Once | INTRAVENOUS | Status: AC
  Administered 2017-11-22: 10 meq via INTRAVENOUS
  Filled 2017-11-22: qty 100

## 2017-11-22 NOTE — ED Notes (Signed)
Potassium level 2.7 Dr Stark Jock and Shelbie Proctor RN notified

## 2017-11-22 NOTE — ED Triage Notes (Signed)
PT presents with hyponatremia. She was called by her physician this evening and told to come to the hospital because her sodium is 120. She is currently being treated for pneumonia. Her BP in traige is 206/70. She endorses weakness.

## 2017-11-22 NOTE — H&P (Addendum)
History and Physical    Mary Merritt NIO:[id number] DOB: Oct 30, 1927 DOA: 11/22/2017  PCP: Katherina Mires, MD Patient coming from: Home  Chief Complaint: Abnormal lab  HPI: Mary Merritt is a 84 y.o. female with medical history significant of essential hypertension, hyperlipidemia, AAA status post repair, bradycardia. Patient was at her PCPs office for a routine physical and she was called late that night secondary to a low sodium and told to go to the ED for evaluation. She has some fatigue, but attributes that to her pneumonia. She reports being told she was dehydrated by her PCP and was drinking about 4-5 6-8 oz glasses of water for the last two days.  ED Course: Vitals: Afebrile, pulse of 40s to low 50s, normal respirations, hypertensive, on room air Labs: Sodium of 120, potassium is 2.7, chloride of 84 Imaging: Chest x-ray significant for small consolidation left lower lobe Medications/Course: Clonidine, ceftriaxone, azithromycin, potassium, 1 L normal saline bolus  Review of Systems: Review of Systems  Constitutional: Negative for chills and fever.  Respiratory: Positive for cough. Negative for sputum production, shortness of breath and wheezing.   Cardiovascular: Negative for chest pain and palpitations.  Gastrointestinal: Negative for abdominal pain, constipation, diarrhea, nausea and vomiting.  Endo/Heme/Allergies: Negative for polydipsia.  All other systems reviewed and are negative.   Past Medical History:  Diagnosis Date  . Anxiety   . Arthritis   . Dysrhythmia    bradycardia  . History of bronchitis   . Hyperlipidemia   . Hypertension   . Seasonal allergies     Past Surgical History:  Procedure Laterality Date  . ABDOMINAL AORTIC ANEURYSM REPAIR     2009, Dr. Scot Dock  . CARDIAC CATHETERIZATION     patient thinks it was in 09 with dr Scot Dock  . cataracts removed    . INCONTINENCE SURGERY    . TOTAL HIP ARTHROPLASTY Right 08/01/2014   Procedure: RIGHT TOTAL HIP  ARTHROPLASTY ANTERIOR APPROACH;  Surgeon: Marianna Payment, MD;  Location: [facility];  Service: Orthopedics;  Laterality: Right;  . TOTAL HIP ARTHROPLASTY Left 03/27/2016   Procedure: LEFT TOTAL HIP ARTHROPLASTY ANTERIOR APPROACH;  Surgeon: Leandrew Koyanagi, MD;  Location: [facility];  Service: Orthopedics;  Laterality: Left;     reports that she quit smoking about 33 years ago. She quit after 30.00 years of use. She has never used smokeless tobacco. She reports that she does not drink alcohol or use drugs.  Allergies  Allergen Reactions  . Percocet [Oxycodone-Acetaminophen] Other (See Comments)    Makes head feel funny    Family History  Problem Relation Age of Onset  . Heart failure Father   . Stroke Mother     Prior to Admission medications   Medication Sig Start Date End Date Taking? Authorizing Provider  amLODipine (NORVASC) 5 MG tablet Take 1 tablet (5 mg total) by mouth daily. Patient taking differently: Take 10 mg by mouth daily.  10/06/17  Yes Alvstad, Kristin L, RPH-CPP  aspirin EC 325 MG tablet Take 1 tablet (325 mg total) by mouth 2 (two) times daily. Patient taking differently: Take 81 mg by mouth daily.  03/27/16  Yes Leandrew Koyanagi, MD  azithromycin (ZITHROMAX) 250 MG tablet Take 250 mg by mouth 2 (two) times daily. 11/21/17  Yes [provider]  cetirizine (ZYRTEC) 10 MG tablet Take 10 mg by mouth at bedtime.    Yes [provider]  chlorthalidone (HYGROTON) 25 MG tablet Take 25 mg by mouth  daily.   Yes [provider]  lovastatin (MEVACOR) 20 MG tablet Take 20 mg by mouth at bedtime.    Yes [provider]  metoprolol tartrate (LOPRESSOR) 25 MG tablet TAKE ONE TABLET BY MOUTH TWICE DAILY 07/21/17  Yes Hilty, Nadean Corwin, MD  Omega-3 Fatty Acids (FISH OIL) 1000 MG CAPS Take 2,000 mg by mouth at bedtime.    Yes [provider]  ibuprofen (ADVIL,MOTRIN) 200 MG tablet Take 400 mg by mouth daily as needed for moderate pain.    [provider]  traMADol (ULTRAM) 50 MG tablet Take 1 tablet (50 mg total) by mouth every 12 (twelve) hours as needed. 08/05/17   Aundra Dubin, PA-C    Physical Exam: Vitals:   11/22/17 0411 11/22/17 0430 11/22/17 0551 11/22/17 0555  BP: (!) 115/58 (!) 109/50  (!) 155/58  Pulse: (!) 49 (!) 44  (!) 44  Resp: 15 14  16   Temp:    97.9 F (36.6 C)  TempSrc:      SpO2: 97% 96%  99%  Weight:   72.3 kg (159 lb 6.3 oz)   Height:   5\' 5"  (1.651 m)     Physical Exam  Constitutional: She is oriented to person, place, and time. She appears well-developed and well-nourished.  HENT:  Mouth/Throat: Oropharynx is clear and moist. No oropharyngeal exudate.  Eyes: Pupils are equal, round, and reactive to light. Conjunctivae are normal.  Neck: Normal range of motion. No thyromegaly present.  Cardiovascular: Regular rhythm, normal heart sounds and intact distal pulses. Bradycardia present.  No murmur heard. Pulmonary/Chest: No respiratory distress. She has decreased breath sounds in the right lower field and the left lower field. She has no wheezes. She has no rhonchi. She has rales in the right lower field and the left lower field.  Abdominal: Soft. She exhibits no mass. There is no tenderness. There is no rebound and no guarding.  Musculoskeletal: Normal range of motion. She exhibits no edema.  Neurological: She is alert and oriented to person, place, and time.  Skin: Skin is warm and dry.  Psychiatric: She has a normal mood and affect. Her behavior is normal.  Nursing note and vitals reviewed.    Labs on Admission: I have personally reviewed following labs and imaging studies  CBC: Recent Labs  Lab 11/22/17 0053  WBC 9.0  NEUTROABS 5.6  HGB 14.1  HCT 38.0  MCV 82.4  PLT 517   Basic Metabolic Panel: Recent Labs  Lab 11/22/17 0053  NA 120*  K 2.7*  CL 84*  CO2 23  GLUCOSE 109*  BUN 14  CREATININE 0.74  CALCIUM 8.5*   GFR: Estimated Creatinine Clearance: 49.4 mL/min (by C-G formula  based on SCr of 0.74 mg/dL). Liver Function Tests: Recent Labs  Lab 11/22/17 0053  AST 25  ALT 14  ALKPHOS 76  BILITOT 1.1  PROT 6.5  ALBUMIN 3.5   No results for input(s): LIPASE, AMYLASE in the last 168 hours. No results for input(s): AMMONIA in the last 168 hours. Coagulation Profile: No results for input(s): INR, PROTIME in the last 168 hours. Cardiac Enzymes: No results for input(s): CKTOTAL, CKMB, CKMBINDEX, TROPONINI in the last 168 hours. BNP (last 3 results) No results for input(s): PROBNP in the last 8760 hours. HbA1C: No results for input(s): HGBA1C in the last 72 hours. CBG: No results for input(s): GLUCAP in the last 168 hours. Lipid Profile: No results for input(s): CHOL, HDL, LDLCALC, TRIG, CHOLHDL, LDLDIRECT  in the last 72 hours. Thyroid Function Tests: No results for input(s): TSH, T4TOTAL, FREET4, T3FREE, THYROIDAB in the last 72 hours. Anemia Panel: No results for input(s): VITAMINB12, FOLATE, FERRITIN, TIBC, IRON, RETICCTPCT in the last 72 hours. Urine analysis:    Component Value Date/Time   COLORURINE YELLOW 03/18/2016 1432   APPEARANCEUR CLEAR 03/18/2016 1432   LABSPEC 1.018 03/18/2016 1432   PHURINE 6.0 03/18/2016 1432   GLUCOSEU NEGATIVE 03/18/2016 1432   HGBUR NEGATIVE 03/18/2016 1432   BILIRUBINUR NEGATIVE 03/18/2016 1432   KETONESUR NEGATIVE 03/18/2016 1432   PROTEINUR NEGATIVE 03/18/2016 1432   UROBILINOGEN 0.2 07/21/2014 1356   NITRITE NEGATIVE 03/18/2016 1432   LEUKOCYTESUR SMALL (A) 03/18/2016 1432    Radiological Exams on Admission: Dg Chest 2 View  Result Date: 11/22/2017 CLINICAL DATA:  84 y/o  F; hyponatremia.  One month of cough. EXAM: CHEST - 2 VIEW COMPARISON:  02/24/2016 chest radiograph FINDINGS: Normal cardiac silhouette. Aortic atherosclerosis with calcification. Small consolidation within the dependent left lower lobe. Possible small left effusion. No acute osseous abnormality is evident. IMPRESSION: Small consolidation  within the dependent left lower lobe and possible small left effusion may represent pneumonia. Electronically Signed   By: Kristine Garbe M.D.   On: 11/22/2017 01:16    EKG: Independently reviewed. Sinus bradycardia with prolonged PR and flat t-waves in anterior leads. Compared to prior EKG in 2017, similar with more pronounced t-wave flattening.  Assessment/Plan Active Problems:   Hyponatremia  Hyponatremia Patient appears euvolemic on my exam, but was dehydrated per report prior to admission.  She has responded to normal saline so far.  She take quite chlorthalidone, which is not new medication. -Hold chlorthalidone.  Could consider restarting as an outpatient and watching sodium levels closely to ensure no recurrence -Continue normal saline IV fluids -BMP every 4 hours -Urine/serum osmolality pending  Essential hypertension Difficult to control per history.  Hypertensive.  Given clonidine in the ED. -Continue home metoprolol and increase to amlodipine 10 mg daily  Sinus bradycardia Likely secondary to metoprolol.  Patient states that she has had bradycardia chronically and is asymptomatic. -Monitor on telemetry -Will reduce to 12.5 mg BID  Community-acquired pneumonia Patient has been treated for almost 2 weeks initially with doxycycline and now starting azithromycin.  Her cough is improved and is nonproductive.  She has no fevers.  Legionella urine antigen pending. -Discontinue antibiotics and watch clinically  Hyperlipidemia -Continue Statin  Diastolic heart dysfunction Seen on 2015 Transthoracic Echocardiogram. Stable.   DVT prophylaxis: Lovenox Code Status: DNR Family Communication: None at bedside Disposition Plan: Discharge home tomorrow once sodium improved. Consults called: None Admission status: Observation, telemetry   Cordelia Poche, MD [facility] Pager [phone]  If 7PM-7AM, please contact night-coverage [email or web address] Password  [facility]  11/22/2017, 7:49 AM

## 2017-11-22 NOTE — Plan of Care (Signed)
Patient stable during 7 a to 7 p shift, states she feels much improved from admission.  NA steadily on the rise, K+ WNL.  Patient up to chair with standby assist, ambulatory around unit accompanied by daughter.

## 2017-11-22 NOTE — ED Provider Notes (Signed)
[facility] EMERGENCY DEPARTMENT Provider Note   CSN: [id number] Arrival date & time: 11/22/17  0019     History   Chief Complaint Chief Complaint  Patient presents with  . Abnormal Lab    HPI Mary Merritt is a 84 y.o. female.  Patient is an 84 year old female with past medical history of hypertension, arthritis, and hypercholesterolemia.  She was told to come to the ER by her primary doctor's office after she was told she has a low sodium.  She has been treated for pneumonia for the past 2 weeks.  She had a follow-up appointment today and was called this evening and informed of this abnormal lab.  The history is provided by the patient.  Abnormal Lab  Time since result:  1 hour Patient referred by:  PCP Resulting agency:  External Result type: chemistry   Chemistry:    Sodium:  Low   Past Medical History:  Diagnosis Date  . Anxiety   . Arthritis   . Dysrhythmia    bradycardia  . History of bronchitis   . Hyperlipidemia   . Hypertension   . Seasonal allergies     Patient Active Problem List   Diagnosis Date Noted  . Nondisplaced fracture of left tibial spine, subsequent encounter for closed fracture with routine healing 08/05/2017  . Chronic pain of left knee 07/21/2017  . Presence of left artificial hip joint 05/30/2016  . Hip joint replacement status 03/27/2016  . S/P hip replacement 07/27/2015  . Osteoarthritis of right hip 08/01/2014  . Essential hypertension 05/31/2014  . Mixed hyperlipidemia 05/31/2014  . S/P AAA repair 05/31/2014  . Bradycardia 05/31/2014  . Murmur 05/31/2014    Past Surgical History:  Procedure Laterality Date  . ABDOMINAL AORTIC ANEURYSM REPAIR     2009, Dr. Scot Dock  . CARDIAC CATHETERIZATION     patient thinks it was in 09 with dr Scot Dock  . cataracts removed    . INCONTINENCE SURGERY    . TOTAL HIP ARTHROPLASTY Right 08/01/2014   Procedure: RIGHT TOTAL HIP ARTHROPLASTY ANTERIOR APPROACH;  Surgeon: Marianna Payment, MD;  Location: [facility];  Service: Orthopedics;  Laterality: Right;  . TOTAL HIP ARTHROPLASTY Left 03/27/2016   Procedure: LEFT TOTAL HIP ARTHROPLASTY ANTERIOR APPROACH;  Surgeon: Leandrew Koyanagi, MD;  Location: [facility];  Service: Orthopedics;  Laterality: Left;     OB History   None      Home Medications    Prior to Admission medications   Medication Sig Start Date End Date Taking? Authorizing Provider  amLODipine (NORVASC) 5 MG tablet Take 1 tablet (5 mg total) by mouth daily. 10/06/17   Alvstad, Casimiro Needle, RPH-CPP  aspirin EC 325 MG tablet Take 1 tablet (325 mg total) by mouth 2 (two) times daily. 03/27/16   Leandrew Koyanagi, MD  cetirizine (ZYRTEC) 10 MG tablet Take 10 mg by mouth at bedtime.     [provider]  hydrochlorothiazide (HYDRODIURIL) 25 MG tablet TAKE ONE TABLET BY MOUTH ONCE DAILY 10/07/17   Hilty, Nadean Corwin, MD  ibuprofen (ADVIL,MOTRIN) 200 MG tablet Take 400 mg by mouth daily as needed for moderate pain.    [provider]  lovastatin (MEVACOR) 20 MG tablet Take 20 mg by mouth at bedtime.     [provider]  metoprolol tartrate (LOPRESSOR) 25 MG tablet TAKE ONE TABLET BY MOUTH TWICE DAILY 07/21/17   Hilty, Nadean Corwin, MD  Omega-3 Fatty Acids (FISH OIL) 1000 MG CAPS Take  2,000 mg by mouth at bedtime.     [provider]  traMADol (ULTRAM) 50 MG tablet Take 1 tablet (50 mg total) by mouth every 12 (twelve) hours as needed. 08/05/17   Aundra Dubin, PA-C    Family History Family History  Problem Relation Age of Onset  . Heart failure Father   . Stroke Mother     Social History Social History   Tobacco Use  . Smoking status: Former Smoker    Years: 30.00    Last attempt to quit: 05/31/1984    Years since quitting: 33.5  . Smokeless tobacco: Never Used  Substance Use Topics  . Alcohol use: No    Alcohol/week: 0.0 oz  . Drug use: No     Allergies   Percocet [oxycodone-acetaminophen]   Review of Systems Review of Systems    All other systems reviewed and are negative.    Physical Exam Updated Vital Signs BP (!) 206/70   Pulse (!) 52   Temp 97.6 F (36.4 C) (Oral)   Resp 16   Ht 5\' 5"  (1.651 m)   Wt 72.1 kg (159 lb)   SpO2 99%   BMI 26.46 kg/m   Physical Exam  Constitutional: She is oriented to person, place, and time. She appears well-developed and well-nourished. No distress.  HENT:  Head: Normocephalic and atraumatic.  Neck: Normal range of motion. Neck supple.  Cardiovascular: Normal rate and regular rhythm. Exam reveals no gallop and no friction rub.  No murmur heard. Pulmonary/Chest: Effort normal and breath sounds normal. No respiratory distress. She has no wheezes.  Abdominal: Soft. Bowel sounds are normal. She exhibits no distension. There is no tenderness.  Musculoskeletal: Normal range of motion. She exhibits no edema or tenderness.  Neurological: She is alert and oriented to person, place, and time.  Skin: Skin is warm and dry. She is not diaphoretic.  Nursing note and vitals reviewed.    ED Treatments / Results  Labs (all labs ordered are listed, but only abnormal results are displayed) Labs Reviewed  CBC WITH DIFFERENTIAL/PLATELET  COMPREHENSIVE METABOLIC PANEL    ED ECG REPORT   Date: 11/22/2017  Rate: 46  Rhythm: sinus bradycardia  QRS Axis: normal  Intervals: normal  ST/T Wave abnormalities: normal  Conduction Disutrbances:none  Narrative Interpretation:   Old EKG Reviewed: unchanged  I have personally reviewed the EKG tracing and agree with the computerized printout as noted.   Radiology No results found.  Procedures Procedures (including critical care time)  Medications Ordered in ED Medications  sodium chloride 0.9 % bolus 500 mL (has no administration in time range)     Initial Impression / Assessment and Plan / ED Course  I have reviewed the triage vital signs and the nursing notes.  Pertinent labs & imaging results that were available during  my care of the patient were reviewed by me and considered in my medical decision making (see chart for details).  Patient presents with low sodium at the recommendation of her primary doctor.  Her sodium was repeated and is 120 here.  She also has low potassium of 2.7 and persistent infiltrate in the left lower lobe.  I have discussed the patient with Dr. Hal Hope from the hospitalist service who agrees with me that the patient should be admitted for correction of her sodium and potassium and IV antibiotics.  She will be transferred to [facility] long for admission.  Final Clinical Impressions(s) / ED Diagnoses   Final diagnoses:  None  ED Discharge Orders    None       Veryl Speak, MD 11/22/17 [phone]

## 2017-11-22 NOTE — ED Notes (Signed)
Patient transported to X-ray 

## 2017-11-23 DIAGNOSIS — I10 Essential (primary) hypertension: Secondary | ICD-10-CM | POA: Diagnosis not present

## 2017-11-23 DIAGNOSIS — R001 Bradycardia, unspecified: Secondary | ICD-10-CM

## 2017-11-23 DIAGNOSIS — E876 Hypokalemia: Secondary | ICD-10-CM

## 2017-11-23 DIAGNOSIS — E871 Hypo-osmolality and hyponatremia: Secondary | ICD-10-CM

## 2017-11-23 DIAGNOSIS — E782 Mixed hyperlipidemia: Secondary | ICD-10-CM | POA: Diagnosis not present

## 2017-11-23 DIAGNOSIS — J181 Lobar pneumonia, unspecified organism: Secondary | ICD-10-CM

## 2017-11-23 LAB — BASIC METABOLIC PANEL
ANION GAP: 10 (ref 5–15)
ANION GAP: 9 (ref 5–15)
BUN: 7 mg/dL (ref 6–20)
BUN: 9 mg/dL (ref 6–20)
CALCIUM: 8.6 mg/dL — AB (ref 8.9–10.3)
CO2: 23 mmol/L (ref 22–32)
CO2: 25 mmol/L (ref 22–32)
CREATININE: 0.67 mg/dL (ref 0.44–1.00)
Calcium: 8.5 mg/dL — ABNORMAL LOW (ref 8.9–10.3)
Chloride: 95 mmol/L — ABNORMAL LOW (ref 101–111)
Chloride: 96 mmol/L — ABNORMAL LOW (ref 101–111)
Creatinine, Ser: 0.8 mg/dL (ref 0.44–1.00)
GFR calc Af Amer: >60 mL/min (ref >60)
GFR calc non Af Amer: >60 mL/min (ref >60)
GLUCOSE: 116 mg/dL — AB (ref 65–99)
Glucose, Bld: 92 mg/dL (ref 65–99)
POTASSIUM: 3.5 mmol/L (ref 3.5–5.1)
Potassium: 3.7 mmol/L (ref 3.5–5.1)
Sodium: 128 mmol/L — ABNORMAL LOW (ref 135–145)
Sodium: 130 mmol/L — ABNORMAL LOW (ref 135–145)

## 2017-11-23 LAB — SODIUM, URINE, RANDOM: Sodium, Ur: 14 mmol/L

## 2017-11-23 LAB — CBC
HEMATOCRIT: 35 % — AB (ref 36.0–46.0)
Hemoglobin: 12.2 g/dL (ref 12.0–15.0)
MCH: 30.3 pg (ref 26.0–34.0)
MCHC: 34.9 g/dL (ref 30.0–36.0)
MCV: 87.1 fL (ref 78.0–100.0)
Platelets: 269 10*3/uL (ref 150–400)
RBC: 4.02 MIL/uL (ref 3.87–5.11)
RDW: 12.5 % (ref 11.5–15.5)
WBC: 8.3 10*3/uL (ref 4.0–10.5)

## 2017-11-23 LAB — TSH: TSH: 2.445 u[IU]/mL (ref 0.350–4.500)

## 2017-11-23 NOTE — Evaluation (Signed)
Physical Therapy Evaluation Patient Details Name: Mary Merritt MRN: [id number] DOB: Dec 15, 1927 Today's Date: 11/23/2017   History of Present Illness  Pt admitted with hyponatremia and hx of AAA repair and bil THR  Clinical Impression  Pt admitted as above and presents with functional mobility limitations 2* mild generalized weakness and ambulatory balance deficits.  Pt should progress to dc home with intermittent family assist.    Follow Up Recommendations No PT follow up    Equipment Recommendations  None recommended by PT    Recommendations for Other Services       Precautions / Restrictions Precautions Precautions: Fall Restrictions Weight Bearing Restrictions: No      Mobility  Bed Mobility               General bed mobility comments: Pt up in chair and requests back to same  Transfers Overall transfer level: Modified independent Equipment used: None Transfers: Sit to/from Stand Sit to Stand: Modified independent (Device/Increase time)         General transfer comment: unassisted  Ambulation/Gait Ambulation/Gait assistance: Min assist;Supervision Ambulation Distance (Feet): 700 Feet Assistive device: None;1 person hand held assist Gait Pattern/deviations: Step-through pattern;Decreased step length - right;Decreased step length - left;Shuffle;Wide base of support     General Gait Details: Pt ambulated 450' with mild intermitent instability but no LOB.  Pt ambulated additioinal 250' with shoes on with marked improvement in stability noted.  Stairs            Wheelchair Mobility    Modified Rankin (Stroke Patients Only)       Balance Overall balance assessment: Needs assistance Sitting-balance support: No upper extremity supported;Feet supported Sitting balance-Leahy Scale: Normal     Standing balance support: No upper extremity supported Standing balance-Leahy Scale: Good                               Pertinent  Vitals/Pain Pain Assessment: No/denies pain    Home Living Family/patient expects to be discharged to:: Private residence Living Arrangements: Alone Available Help at Discharge: Available PRN/intermittently Type of Home: House Home Access: Stairs to enter Entrance Stairs-Rails: Right Entrance Stairs-Number of Steps: 2 Home Layout: One level Home Equipment: Walker - 2 wheels;Cane - single point;Shower seat - built in      Prior Function Level of Independence: Independent               Journalist, newspaper        Extremity/Trunk Assessment   Upper Extremity Assessment Upper Extremity Assessment: Overall WFL for tasks assessed    Lower Extremity Assessment Lower Extremity Assessment: Generalized weakness       Communication   Communication: HOH  Cognition Arousal/Alertness: Awake/alert Behavior During Therapy: WFL for tasks assessed/performed Overall Cognitive Status: Within Functional Limits for tasks assessed                                        General Comments      Exercises     Assessment/Plan    PT Assessment Patient needs continued PT services  PT Problem List Decreased strength;Decreased balance       PT Treatment Interventions DME instruction;Gait training;Functional mobility training;Stair training;Therapeutic activities;Therapeutic exercise;Balance training;Patient/family education    PT Goals (Current goals can be found in the Care Plan section)  Acute Rehab PT Goals Patient Stated  Goal: Home PT Goal Formulation: With patient Time For Goal Achievement: 12/07/17 Potential to Achieve Goals: Good    Frequency Min 3X/week   Barriers to discharge        Co-evaluation               AM-PAC PT "6 Clicks" Daily Activity  Outcome Measure Difficulty turning over in bed (including adjusting bedclothes, sheets and blankets)?: None Difficulty moving from lying on back to sitting on the side of the bed? : A Little Difficulty  sitting down on and standing up from a chair with arms (e.g., wheelchair, bedside commode, etc,.)?: A Little Help needed moving to and from a bed to chair (including a wheelchair)?: A Little Help needed walking in hospital room?: A Little Help needed climbing 3-5 steps with a railing? : A Little 6 Click Score: 19    End of Session Equipment Utilized During Treatment: Gait belt Activity Tolerance: Patient tolerated treatment well Patient left: in chair;with call bell/phone within reach Nurse Communication: Mobility status PT Visit Diagnosis: Unsteadiness on feet (R26.81)    Time: 9794-8016 PT Time Calculation (min) (ACUTE ONLY): 20 min   Charges:   PT Evaluation $PT Eval Low Complexity: 1 Low     PT G Codes:        Pg [phone]   Argentina Kosch 11/23/2017, 3:34 PM

## 2017-11-23 NOTE — Progress Notes (Signed)
PROGRESS NOTE  ARMA REINING WUJ:[id number] DOB: Dec 04, 1927 DOA: 11/22/2017 PCP: Katherina Mires, MD  HPI/Recap of past 24 hours: Mary Merritt is a 84 y.o. female with medical history significant of essential hypertension, hyperlipidemia, AAA status post repair, bradycardia. Patient was at her PCPs office for a routine physical and she was called late that night secondary to a low sodium and told to go to the ED for evaluation. She has some fatigue, but attributes that to her pneumonia. She reports being told she was dehydrated by her PCP and was drinking about 4-5 6-8 oz glasses of water for the last two days.  11/23/17: No new complaints. Denies headache or dizziness. Na+ dropped from 130 to 128. Repeat urine lytes.  Assessment/Plan: Active Problems:   Essential hypertension   Mixed hyperlipidemia   Bradycardia   Hyponatremia  Euvolemic hyponatremia On NS Na+ 128 from 130 this am Repeat urine lytes Repeat BMP in the am  HTN BP is well controlled C/w metoprolol, amlodipine  Sinus bradycardia Suspect from BB asymtomatic  Residual CAP Afebrile, no leukocytosis Completed course on antibiotic   HLD C/w statin  Chronic dCHF No acute issues   Code Status: full   Family Communication: none at bedside  Disposition Plan: home when clinically stable.    Consultants:  none  Procedures:  none  Antimicrobials:  none  DVT prophylaxis:  sq lovenox   Objective: Vitals:   11/22/17 1419 11/22/17 2123 11/23/17 0415 11/23/17 0850  BP: 106/60 (!) 145/64 (!) 134/57   Pulse: (!) 52 60 (!) 42 73  Resp: 17 18 18    Temp: 97.9 F (36.6 C) 98.6 F (37 C) 98.8 F (37.1 C)   TempSrc: Oral Oral Oral   SpO2: 98% 96% 95%   Weight:      Height:        Intake/Output Summary (Last 24 hours) at 11/23/2017 1533 Last data filed at 11/23/2017 0940 Gross per 24 hour  Intake 240 ml  Output 550 ml  Net -310 ml   Filed Weights   11/22/17 0025 11/22/17 0551  Weight: 72.1 kg  (159 lb) 72.3 kg (159 lb 6.3 oz)    Exam:  . General: 84 y.o. year-old female well developed well nourished in no acute distress.  Alert and oriented x3. . Cardiovascular: Regular rate and rhythm with no rubs or gallops.  No thyromegaly or JVD noted.   Marland Kitchen Respiratory: Clear to auscultation with no wheezes or rales. Good inspiratory effort. . Abdomen: Soft nontender nondistended with normal bowel sounds x4 quadrants. . Musculoskeletal: No lower extremity edema. 2/4 pulses in all 4 extremities. . Skin: No ulcerative lesions noted or rashes, . Psychiatry: Mood is appropriate for condition and setting   Data Reviewed: CBC: Recent Labs  Lab 11/22/17 0053 11/23/17 0429  WBC 9.0 8.3  NEUTROABS 5.6  --   HGB 14.1 12.2  HCT 38.0 35.0*  MCV 82.4 87.1  PLT 335 956   Basic Metabolic Panel: Recent Labs  Lab 11/22/17 1135 11/22/17 1528 11/22/17 1903 11/22/17 2342 11/23/17 0848  NA 128* 129* 130* 130* 128*  K 3.1* 3.6 3.7 3.7 3.5  CL 92* 92* 94* 95* 96*  CO2 27 26 25 25 23   GLUCOSE 111* 142* 122* 92 116*  BUN 10 9 10 9 7   CREATININE 0.68 0.74 0.60 0.67 0.80  CALCIUM 8.2* 8.5* 8.6* 8.6* 8.5*  MG  --  1.1*  --   --   --    GFR:  Estimated Creatinine Clearance: 49.4 mL/min (by C-G formula based on SCr of 0.8 mg/dL). Liver Function Tests: Recent Labs  Lab 11/22/17 0053  AST 25  ALT 14  ALKPHOS 76  BILITOT 1.1  PROT 6.5  ALBUMIN 3.5   No results for input(s): LIPASE, AMYLASE in the last 168 hours. No results for input(s): AMMONIA in the last 168 hours. Coagulation Profile: No results for input(s): INR, PROTIME in the last 168 hours. Cardiac Enzymes: No results for input(s): CKTOTAL, CKMB, CKMBINDEX, TROPONINI in the last 168 hours. BNP (last 3 results) No results for input(s): PROBNP in the last 8760 hours. HbA1C: No results for input(s): HGBA1C in the last 72 hours. CBG: No results for input(s): GLUCAP in the last 168 hours. Lipid Profile: No results for input(s):  CHOL, HDL, LDLCALC, TRIG, CHOLHDL, LDLDIRECT in the last 72 hours. Thyroid Function Tests: No results for input(s): TSH, T4TOTAL, FREET4, T3FREE, THYROIDAB in the last 72 hours. Anemia Panel: No results for input(s): VITAMINB12, FOLATE, FERRITIN, TIBC, IRON, RETICCTPCT in the last 72 hours. Urine analysis:    Component Value Date/Time   COLORURINE YELLOW 03/18/2016 1432   APPEARANCEUR CLEAR 03/18/2016 1432   LABSPEC 1.018 03/18/2016 1432   PHURINE 6.0 03/18/2016 1432   GLUCOSEU NEGATIVE 03/18/2016 1432   HGBUR NEGATIVE 03/18/2016 1432   BILIRUBINUR NEGATIVE 03/18/2016 1432   KETONESUR NEGATIVE 03/18/2016 1432   PROTEINUR NEGATIVE 03/18/2016 1432   UROBILINOGEN 0.2 07/21/2014 1356   NITRITE NEGATIVE 03/18/2016 1432   LEUKOCYTESUR SMALL (A) 03/18/2016 1432   Sepsis Labs: @LABRCNTIP (procalcitonin:4,lacticidven:4)  )No results found for this or any previous visit (from the past 240 hour(s)).    Studies: No results found.  Scheduled Meds: . amLODipine  10 mg Oral Daily  . aspirin EC  81 mg Oral Daily  . enoxaparin (LOVENOX) injection  40 mg Subcutaneous Q24H  . loratadine  10 mg Oral Daily  . metoprolol tartrate  12.5 mg Oral BID  . pravastatin  20 mg Oral q1800    Continuous Infusions: . azithromycin       LOS: 0 days     Kayleen Memos, MD [facility] Pager [phone]  If 7PM-7AM, please contact night-coverage [email or web address] Password [facility] 11/23/2017, 3:33 PM

## 2017-11-24 DIAGNOSIS — E782 Mixed hyperlipidemia: Secondary | ICD-10-CM

## 2017-11-24 DIAGNOSIS — E871 Hypo-osmolality and hyponatremia: Secondary | ICD-10-CM | POA: Diagnosis not present

## 2017-11-24 DIAGNOSIS — J181 Lobar pneumonia, unspecified organism: Secondary | ICD-10-CM | POA: Diagnosis not present

## 2017-11-24 DIAGNOSIS — R001 Bradycardia, unspecified: Secondary | ICD-10-CM | POA: Diagnosis not present

## 2017-11-24 DIAGNOSIS — I10 Essential (primary) hypertension: Secondary | ICD-10-CM | POA: Diagnosis not present

## 2017-11-24 LAB — BASIC METABOLIC PANEL
ANION GAP: 12 (ref 5–15)
BUN: 9 mg/dL (ref 6–20)
CHLORIDE: 93 mmol/L — AB (ref 101–111)
CO2: 24 mmol/L (ref 22–32)
Calcium: 8.9 mg/dL (ref 8.9–10.3)
Creatinine, Ser: 0.71 mg/dL (ref 0.44–1.00)
GFR calc Af Amer: >60 mL/min (ref >60)
Glucose, Bld: 159 mg/dL — ABNORMAL HIGH (ref 65–99)
POTASSIUM: 3.2 mmol/L — AB (ref 3.5–5.1)
SODIUM: 129 mmol/L — AB (ref 135–145)

## 2017-11-24 LAB — LEGIONELLA PNEUMOPHILA SEROGP 1 UR AG: L. pneumophila Serogp 1 Ur Ag: NEGATIVE

## 2017-11-24 LAB — OSMOLALITY, URINE: Osmolality, Ur: 429 mOsm/kg (ref 300–900)

## 2017-11-24 LAB — OSMOLALITY: OSMOLALITY: 262 mosm/kg — AB (ref 275–295)

## 2017-11-24 MED ORDER — POTASSIUM CHLORIDE CRYS ER 20 MEQ PO TBCR
40.0000 meq | EXTENDED_RELEASE_TABLET | Freq: Once | ORAL | Status: AC
  Administered 2017-11-24: 40 meq via ORAL

## 2017-11-24 MED ORDER — AMLODIPINE BESYLATE 10 MG PO TABS: 10.0000 mg | ORAL_TABLET | Freq: Every day | ORAL | 0 refills | Status: DC

## 2017-11-24 MED ORDER — METOPROLOL TARTRATE 25 MG PO TABS: 12.5000 mg | ORAL_TABLET | Freq: Two times a day (BID) | ORAL | 0 refills | Status: DC

## 2017-11-24 NOTE — Discharge Summary (Addendum)
Discharge Summary  Mary Merritt GGE:[id number] DOB: 08/24/1927  PCP: Katherina Mires, MD  Admit date: 11/22/2017 Discharge date: 11/24/2017  Time spent: 25 minutes  Recommendations for Outpatient Follow-up:  1. Follow up with PCP 2. Follow up with cardiology 3. Take your medications as prescribed 4. Repeat BMP on 11/26/17.  Discharge Diagnoses:  Active Hospital Problems   Diagnosis Date Noted  . Hyponatremia 11/22/2017  . Bradycardia 05/31/2014  . Mixed hyperlipidemia 05/31/2014  . Essential hypertension 05/31/2014    Resolved Hospital Problems  No resolved problems to display.    Discharge Condition: Stable  Diet recommendation: Heart healthy diet  Vitals:   11/23/17 2108 11/24/17 0600  BP: (!) 147/58 (!) 152/59  Pulse: 64 (!) 57  Resp: 18 18  Temp: 97.9 F (36.6 C) 98 F (36.7 C)  SpO2: 99% 97%    History of present illness:  Mary Merritt a 84 y.o.femalewith medical history significant of essential hypertension, hyperlipidemia, AAA status post repair, bradycardia. Patient was at her PCPs office for a routine physical and she was called late that night secondary to a low sodium and told to go to the ED for evaluation. She has some fatigue, but attributes that to her pneumonia. She reports being told she was dehydrated by her PCP and was drinking about 4-5 6-8 oz glasses of water for the last two days.  11/24/17: NO new complaints. Denies headaches, dizziness, nausea, chest pain, dyspnea or fatigue.  On the day of discharge the patient was hemodynamically stable. She will need to follow up with her PCP, cardiology post hospitalization.    Hospital Course:  Active Problems:   Essential hypertension   Mixed hyperlipidemia   Bradycardia   Hyponatremia  Euvolemic hyponatremia, improving Na+ 129 from 128  Na+ 120 on admission Asymptomatic Repeat BMP on 11/26/17  HTN BP is well controlled C/w metoprolol, amlodipine  Sinus bradycardia Suspect from  BB asymtomatic  Residual CAP Afebrile, no leukocytosis Completed course on antibiotic   HLD C/w statin  Chronic dCHF No acute issues     Procedures: None  Consultations:  None  Discharge Exam: BP (!) 152/59 (BP Location: Right Arm)   Pulse (!) 57   Temp 98 F (36.7 C) (Oral)   Resp 18   Ht 5\' 5"  (1.651 m)   Wt 72.3 kg (159 lb 6.3 oz)   SpO2 97%   BMI 26.52 kg/m  . General: 84 y.o. year-old female well developed well nourished in no acute distress.  Alert and oriented x3. . Cardiovascular: Regular rate and rhythm with no rubs or gallops.  No thyromegaly or JVD noted.   Marland Kitchen Respiratory: Clear to auscultation with no wheezes or rales. Good inspiratory effort. . Abdomen: Soft nontender nondistended with normal bowel sounds x4 quadrants. . Musculoskeletal: No lower extremity edema. 2/4 pulses in all 4 extremities. . Skin: No ulcerative lesions noted or rashes, . Psychiatry: Mood is appropriate for condition and setting  Discharge Instructions You were cared for by a hospitalist during your hospital stay. If you have any questions about your discharge medications or the care you received while you were in the hospital after you are discharged, you can call the unit and asked to speak with the hospitalist on call if the hospitalist that took care of you is not available. Once you are discharged, your primary care physician will handle any further medical issues. Please note that NO REFILLS for any discharge medications will be authorized once you are discharged, as  it is imperative that you return to your primary care physician (or establish a relationship with a primary care physician if you do not have one) for your aftercare needs so that they can reassess your need for medications and monitor your lab values.   Allergies as of 11/24/2017      Reactions   Percocet [oxycodone-acetaminophen] Other (See Comments)   Makes head feel funny      Medication List    STOP  taking these medications   azithromycin 250 MG tablet Commonly known as:  ZITHROMAX   hydrochlorothiazide 25 MG tablet Commonly known as:  HYDRODIURIL   ibuprofen 200 MG tablet Commonly known as:  ADVIL,MOTRIN   traMADol 50 MG tablet Commonly known as:  ULTRAM     TAKE these medications   amLODipine 10 MG tablet Commonly known as:  NORVASC Take 1 tablet (10 mg total) by mouth daily. Start taking on:  11/25/2017 What changed:    medication strength  how much to take   aspirin EC 81 MG tablet Take 81 mg by mouth daily. What changed:  Another medication with the same name was removed. Continue taking this medication, and follow the directions you see here.   cetirizine 10 MG tablet Commonly known as:  ZYRTEC Take 10 mg by mouth at bedtime.   Fish Oil 1000 MG Caps Take 2,000 mg by mouth at bedtime.   lovastatin 20 MG tablet Commonly known as:  MEVACOR Take 20 mg by mouth at bedtime.   metoprolol tartrate 25 MG tablet Commonly known as:  LOPRESSOR Take 0.5 tablets (12.5 mg total) by mouth 2 (two) times daily. What changed:  how much to take   TURMERIC PO Take 1 capsule by mouth daily.      Allergies  Allergen Reactions  . Percocet [Oxycodone-Acetaminophen] Other (See Comments)    Makes head feel funny   Follow-up Information    Katherina Mires, MD. Call in 1 day(s).   Specialty:  Family Medicine Why:  please call for appointment. Contact information: [address] [place] [address] [phone]            The results of significant diagnostics from this hospitalization (including imaging, microbiology, ancillary and laboratory) are listed below for reference.    Significant Diagnostic Studies: Dg Chest 2 View  Result Date: 11/22/2017 CLINICAL DATA:  84 y/o  F; hyponatremia.  One month of cough. EXAM: CHEST - 2 VIEW COMPARISON:  02/24/2016 chest radiograph FINDINGS: Normal cardiac silhouette. Aortic atherosclerosis with  calcification. Small consolidation within the dependent left lower lobe. Possible small left effusion. No acute osseous abnormality is evident. IMPRESSION: Small consolidation within the dependent left lower lobe and possible small left effusion may represent pneumonia. Electronically Signed   By: Kristine Garbe M.D.   On: 11/22/2017 01:16    Microbiology: No results found for this or any previous visit (from the past 240 hour(s)).   Labs: Basic Metabolic Panel: Recent Labs  Lab 11/22/17 1528 11/22/17 1903 11/22/17 2342 11/23/17 0848 11/24/17 0907  NA 129* 130* 130* 128* 129*  K 3.6 3.7 3.7 3.5 3.2*  CL 92* 94* 95* 96* 93*  CO2 26 25 25 23 24   GLUCOSE 142* 122* 92 116* 159*  BUN 9 10 9 7 9   CREATININE 0.74 0.60 0.67 0.80 0.71  CALCIUM 8.5* 8.6* 8.6* 8.5* 8.9  MG 1.1*  --   --   --   --    Liver Function Tests: Recent  Labs  Lab 11/22/17 0053  AST 25  ALT 14  ALKPHOS 76  BILITOT 1.1  PROT 6.5  ALBUMIN 3.5   No results for input(s): LIPASE, AMYLASE in the last 168 hours. No results for input(s): AMMONIA in the last 168 hours. CBC: Recent Labs  Lab 11/22/17 0053 11/23/17 0429  WBC 9.0 8.3  NEUTROABS 5.6  --   HGB 14.1 12.2  HCT 38.0 35.0*  MCV 82.4 87.1  PLT 335 269   Cardiac Enzymes: No results for input(s): CKTOTAL, CKMB, CKMBINDEX, TROPONINI in the last 168 hours. BNP: BNP (last 3 results) No results for input(s): BNP in the last 8760 hours.  ProBNP (last 3 results) No results for input(s): PROBNP in the last 8760 hours.  CBG: No results for input(s): GLUCAP in the last 168 hours.     Signed:  Kayleen Memos, MD [facility] 11/24/2017, 12:07 PM

## 2017-11-24 NOTE — Discharge Instructions (Signed)
Hyponatremia Hyponatremia is when the amount of salt (sodium) in your blood is too low. When salt levels are low, your cells absorb extra water and they swell. The swelling happens throughout the body, but it mostly affects the brain. Follow these instructions at home:  Take medicines only as told by your doctor. Many medicines can make this condition worse. Talk with your doctor about any medicines that you are currently taking.  Carefully follow a recommended diet as told by your doctor.  Carefully follow instructions from your doctor about fluid restrictions.  Keep all follow-up visits as told by your doctor. This is important.  Do not drink alcohol. Contact a doctor if:  You feel sicker to your stomach (nauseous).  You feel more confused.  You feel more tired (fatigued).  Your headache gets worse.  You feel weaker.  Your symptoms go away and then they come back.  You have trouble following the diet instructions. Get help right away if:  You start to twitch and shake (have a seizure).  You pass out (faint).  You keep having watery poop (diarrhea).  You keep throwing up (vomiting). This information is not intended to replace advice given to you by your health care provider. Make sure you discuss any questions you have with your health care provider. Document Released: 02/20/2011 Document Revised: 11/16/2015 Document Reviewed: 06/06/2014 Elsevier Interactive Patient Education  2018 [employer]  

## 2017-11-24 NOTE — Progress Notes (Signed)
Pts IV removed with a clean and dry dressing intact. Pt denies pain at this time with no s/s of distress noted. Pt taken to main entrance by nursing staff.

## 2017-11-25 ENCOUNTER — Telehealth: Payer: Self-pay | Admitting: Internal Medicine

## 2017-11-25 NOTE — Telephone Encounter (Signed)
Returned call to patient. Notified her that Dr. Debara Pickett agreed w/med changes. Advised that she follow her discharge summary med list. Suggested that she track BP & HR 1-2 hours after each lopressor dose to ensure she is doing OK on the meds. Notified her I will reach out to scheduling dept to get her an appt (staff message sent)

## 2017-11-25 NOTE — Telephone Encounter (Signed)
Her meds were changed for appropriate reasons - I suggest following the hospital recommendations and she will need a follow-up appointment soon - APP is ok if she does not mind, I don't have appts until August.  Dr. Lemmie Evens

## 2017-11-25 NOTE — Telephone Encounter (Signed)
New Message:   Please call,she needs to talk to you about her medicine. She have been in the hospital and they changed a lot of her medicine.

## 2017-11-25 NOTE — Telephone Encounter (Signed)
Received call back from Mary Merritt. Mary Merritt reports she is upset because they changed all of her blood pressure medications at the hospital and she wants Dr. Debara Pickett to review her records and see what he recommends.   She states they decreased her metoprolol to 12.5 mg bid, increased amlodipine to 10 mg, stopped HCTZ.  She states she has returned to taking her medication (metoprolol and amlodipine) like Dr. Debara Pickett had prescribed prior to her being in the hospital.   Tried to explain to Mary Merritt why her medications were changed while she was at the hospital and she should take as it was changed until further evaluation.   She states she took metoprolol 25 mg this AM and has not had amlodipine since yesterday AM (at hospital) and she will take tonight as her usual schedule.   Mary Merritt took BP and HR while on phone 196/91 HR 63.   Mary Merritt states she gets herself so upset and runs her BP up.   She states she fells well, no symptoms.   She states she will try to calm herself down and recheck her BP in a little while.    Advised I would route to Dr. Debara Pickett to review but would more than likely need appt to assess.      Mary Merritt aware.

## 2017-11-25 NOTE — Telephone Encounter (Signed)
Left message to call back  

## 2017-12-08 ENCOUNTER — Encounter: Payer: Self-pay | Admitting: Physician Assistant

## 2017-12-08 ENCOUNTER — Ambulatory Visit: Payer: Medicare Other | Admitting: Physician Assistant

## 2017-12-08 VITALS — BP 132/70 | HR 64 | Ht 65.0 in | Wt 161.4 lb

## 2017-12-08 DIAGNOSIS — R001 Bradycardia, unspecified: Secondary | ICD-10-CM

## 2017-12-08 DIAGNOSIS — Z79899 Other long term (current) drug therapy: Secondary | ICD-10-CM | POA: Diagnosis not present

## 2017-12-08 DIAGNOSIS — E871 Hypo-osmolality and hyponatremia: Secondary | ICD-10-CM | POA: Diagnosis not present

## 2017-12-08 DIAGNOSIS — I10 Essential (primary) hypertension: Secondary | ICD-10-CM

## 2017-12-08 MED ORDER — LOSARTAN POTASSIUM 25 MG PO TABS: 25.0000 mg | ORAL_TABLET | Freq: Every day | ORAL | 3 refills | Status: DC

## 2017-12-08 MED ORDER — AMLODIPINE BESYLATE 5 MG PO TABS: 5.0000 mg | ORAL_TABLET | Freq: Every day | ORAL | Status: DC

## 2017-12-08 NOTE — Progress Notes (Signed)
Cardiology Office Note:    Date:  12/08/2017   ID:  Mary Merritt, DOB 19-Jul-1927, MRN [id number]  PCP:  Katherina Mires, MD  Cardiologist:  Pixie Casino, MD   Referring MD: Katherina Mires, MD   Chief Complaint  Patient presents with  . Hospitalization Follow-up    hypertension    History of Present Illness:    Mary Merritt is a 84 y.o. female with a hx of HTN, HLD, s/p AAA repair, and bradycardia. She was seen in clinic with Dr. Debara Pickett for preoperative clearance for orthopedic surgery. She was seen for clearance because of some noted bradycardia with heart rates in the 40s. She underwent nuclear stress test and echocardiogram in 2015, which were normal. Beta blocker was adjusted for bradycardia with improvement of HR in the 60s. She did have some increased lower extremity swelling with increased norvasc. Per notes, she has had difficult to control hypertension.   She was recenlty seen in her PCP office for routine visit and was found to have hyponatremia of 120 and was referred to [facility].  She was hospitalized 11/22/17-11/24/17. She responded to NS. Chlorthalidone was D/C'ed.  She received clonidine in the hospital. Na improved to 129 and she was discharged. Etiology was thought to be the antibiotics she was taking for her PNA.   She presents today for hospital follow up. Her BMP was checked by PCP on 11/26/17, Na was 130. She continues to complain of lower extremity swelling. She is also having trouble taking her lopressor - cutting the pills in half is difficult for her. Her pressures have been [phone] systolic at home. She would very much like to simplify her medication regimen. She is improving from her respiratory illness. She denies anginal symptoms, SOB, palpitations, headache, changes in vision or speech, dizziness, and syncope. She still has a cough.    Past Medical History:  Diagnosis Date  . Anxiety   . Arthritis   . Dysrhythmia    bradycardia  . History of bronchitis   .  Hyperlipidemia   . Hypertension   . Seasonal allergies     Past Surgical History:  Procedure Laterality Date  . ABDOMINAL AORTIC ANEURYSM REPAIR     2009, Dr. Scot Dock  . CARDIAC CATHETERIZATION     patient thinks it was in 09 with dr Scot Dock  . cataracts removed    . INCONTINENCE SURGERY    . TOTAL HIP ARTHROPLASTY Right 08/01/2014   Procedure: RIGHT TOTAL HIP ARTHROPLASTY ANTERIOR APPROACH;  Surgeon: Marianna Payment, MD;  Location: [place];  Service: Orthopedics;  Laterality: Right;  . TOTAL HIP ARTHROPLASTY Left 03/27/2016   Procedure: LEFT TOTAL HIP ARTHROPLASTY ANTERIOR APPROACH;  Surgeon: Leandrew Koyanagi, MD;  Location: [place];  Service: Orthopedics;  Laterality: Left;    Current Medications: Current Meds  Medication Sig  . amLODipine (NORVASC) 5 MG tablet Take 1 tablet (5 mg total) by mouth daily.  Marland Kitchen aspirin EC 81 MG tablet Take 81 mg by mouth daily.  . cetirizine (ZYRTEC) 10 MG tablet Take 10 mg by mouth at bedtime.   . lovastatin (MEVACOR) 20 MG tablet Take 20 mg by mouth at bedtime.   . Omega-3 Fatty Acids (FISH OIL) 1000 MG CAPS Take 2,000 mg by mouth at bedtime.   . TURMERIC PO Take 1 capsule by mouth daily.  . [DISCONTINUED] amLODipine (NORVASC) 10 MG tablet Take 1 tablet (10 mg total) by mouth daily.  . [DISCONTINUED] metoprolol tartrate (LOPRESSOR) 25  MG tablet Take 0.5 tablets (12.5 mg total) by mouth 2 (two) times daily.     Allergies:   Percocet [oxycodone-acetaminophen]   Social History   Socioeconomic History  . Marital status: Widowed    Spouse name: Not on file  . Number of children: Not on file  . Years of education: Not on file  . Highest education level: Not on file  Occupational History  . Not on file  Social Needs  . Financial resource strain: Not on file  . Food insecurity:    Worry: Not on file    Inability: Not on file  . Transportation needs:    Medical: Not on file    Non-medical: Not on file  Tobacco Use  . Smoking status: Former Smoker      Packs/day: 1.00    Years: 30.00    Pack years: 30.00    Last attempt to quit: 05/31/1984    Years since quitting: 33.5  . Smokeless tobacco: Never Used  Substance and Sexual Activity  . Alcohol use: No    Alcohol/week: 0.0 oz  . Drug use: No  . Sexual activity: Not on file  Lifestyle  . Physical activity:    Days per week: Not on file    Minutes per session: Not on file  . Stress: Not on file  Relationships  . Social connections:    Talks on phone: Not on file    Gets together: Not on file    Attends religious service: Not on file    Active member of club or organization: Not on file    Attends meetings of clubs or organizations: Not on file    Relationship status: Not on file  Other Topics Concern  . Not on file  Social History Narrative  . Not on file     Family History: The patient's family history includes Heart failure in her father; Stroke in her mother.  ROS:   Please see the history of present illness.     All other systems reviewed and are negative.  EKGs/Labs/Other Studies Reviewed:    The following studies were reviewed today:  Myoview 06/21/2014 Normal stress nuclear study  Echo 06/21/14 Study Conclusions - Left ventricle: The cavity size was normal. Wall thickness was normal. Systolic function was normal. The estimated ejection fraction was in the range of 60% to 65%. Doppler parameters are consistent with abnormal left ventricular relaxation (grade 1 diastolic dysfunction). - Pericardium, extracardiac: A trivial pericardial effusion was identified.  EKG:  EKG is not ordered today.   Recent Labs: 11/22/2017: ALT 14; Magnesium 1.1 11/23/2017: Hemoglobin 12.2; Platelets 269; TSH 2.445 11/24/2017: BUN 9; Creatinine, Ser 0.71; Potassium 3.2; Sodium 129  Recent Lipid Panel No results found for: CHOL, TRIG, HDL, CHOLHDL, VLDL, LDLCALC, LDLDIRECT  Physical Exam:    VS:  BP 132/70   Pulse 64   Ht 5\' 5"  (1.651 m)   Wt 161 lb 6.4 oz (73.2  kg)   SpO2 96%   BMI 26.86 kg/m     Wt Readings from Last 3 Encounters:  12/08/17 161 lb 6.4 oz (73.2 kg)  11/22/17 159 lb 6.3 oz (72.3 kg)  08/04/17 167 lb (75.8 kg)     GEN: Well nourished, well developed in no acute distress HEENT: Normal NECK: No JVD; No carotid bruits CARDIAC: RRR, no murmurs, rubs, gallops RESPIRATORY:  Clear to auscultation without rales, wheezing or rhonchi  ABDOMEN: Soft, non-tender, non-distended MUSCULOSKELETAL:  Trace B LE edema; No deformity  SKIN: Warm and dry NEUROLOGIC:  Alert and oriented x 3 PSYCHIATRIC:  Normal affect   ASSESSMENT:    1. Essential hypertension   2. Medication management   3. Hyponatremia   4. Bradycardia    PLAN:    In order of problems listed above:  Essential hypertension Medication management Per notes, patient has a history of difficult to control hypertension. She presents today for hospital follow up. Pressures at home have been running [phone] systolic. She complains of lower extremity swelling that has worsened when her norvasc was increased. She states she has been on lopressor since she was 83 yo. HR today is 64 bpm and BP was 132/70. She denies headache, changes in vision, and changes in speech. She has no history of heart disease. I will decrease her noravasc to 5 mg, D/C lopressor, and add losartan 25 mg. I will check a BMP in 1 week. She will return to hypertension clinic in 4 weeks for further medication titration. Hopefully she can come off the norvasc altogether with an increase in losartan. This will hopefully resolve her lower extremity swelling and simplify her medication regimen.   Hyponatremia Will check a BMP in 1 week after starting the losartan. Will defer to PCP for consideration of SIADH if her sodium remains low.   Bradycardia She is asymptomatic. D/C lopressor will likely have little effect on her BP and HR.   Follow up in 4 weeks with Hypertension Clinic. Follow up in 6 months with Dr.  Debara Pickett.   Medication Adjustments/Labs and Tests Ordered: Current medicines are reviewed at length with the patient today.  Concerns regarding medicines are outlined above.  Orders Placed This Encounter  Procedures  . Basic metabolic panel   Meds ordered this encounter  Medications  . amLODipine (NORVASC) 5 MG tablet    Sig: Take 1 tablet (5 mg total) by mouth daily.  Marland Kitchen losartan (COZAAR) 25 MG tablet    Sig: Take 1 tablet (25 mg total) by mouth daily.    Dispense:  90 tablet    Refill:  3    Signed, Ledora Bottcher, [place]  12/08/2017 3:37 PM     [facility]

## 2017-12-08 NOTE — Patient Instructions (Signed)
Medication Instructions: Decrease: Norvasc 5 mg   Start: Losartan 25 mg  Stop: Lopressor 25 mg  If you need a refill on your cardiac medications before your next appointment, please call your pharmacy.   Labwork: Your physician recommends that you return for lab work in: 1 week (BMP)     Follow-Up: Your physician wants you to follow-up in 6 month with Dr. Theresa Duty will receive a reminder letter in the mail two months in advance. If you don't receive a letter, please call our office at [phone] to schedule this follow-up appointment.  Your physician recommends that you schedule a follow-up appointment in: 1 month with Hypertension Clinic.   Special Instructions:    Thank you for choosing [facility] at [facility]!!

## 2017-12-11 ENCOUNTER — Telehealth: Payer: Self-pay | Admitting: Internal Medicine

## 2017-12-11 NOTE — Telephone Encounter (Signed)
  Spoke with pt and pt  was recently in office and there was some med changes .Lopressor was stopped due to bradycardia and Amlodipine was cut back to 5 mg due to edema and losartan was started The patient called complaining of elevated heart rate ranging 93 - 114 and last B/p was 160/70.Per pt feels okay other than being anxious edema has improved, as well .Discussed with Almyra Deforest Pa  and pt should have some elevated heart rate after stopping med this should improve .Have pt continue to monitor and call back if no improvement the patient agrees with plan ./cy

## 2017-12-11 NOTE — Telephone Encounter (Signed)
New Message:        STAT if HR is under 50 or over 120 (normal HR is 60-100 beats per minute)  1) What is your heart rate? 109  2) Do you have a log of your heart rate readings (document readings)?   3) Do you have any other symptoms? No    Pt c/o BP issue: STAT if pt c/o blurred vision, one-sided weakness or slurred speech  1. What are your last 5 BP readings? 173/84  2. Are you having any other symptoms (ex. Dizziness, headache, blurred vision, passed out)? No  3. What is your BP issue? Pt states he BP is up and has a increase in HR

## 2017-12-12 ENCOUNTER — Telehealth: Payer: Self-pay | Admitting: Internal Medicine

## 2017-12-12 MED ORDER — LOSARTAN POTASSIUM 50 MG PO TABS: 50.0000 mg | ORAL_TABLET | Freq: Every day | ORAL | 3 refills | Status: DC

## 2017-12-12 MED ORDER — METOPROLOL TARTRATE 25 MG PO TABS: 12.5000 mg | ORAL_TABLET | Freq: Two times a day (BID) | ORAL | 3 refills | Status: DC

## 2017-12-12 NOTE — Telephone Encounter (Signed)
STAT if HR is under 50 or over 120 (normal HR is 60-100 beats per minute)  1) What is your heart rate? 105 0-she thinks the medicine is causing it 2) Do you have a log of your heart rate readings (document readings)?  Nit stays between 95 to 105  3) Do you have any other symptoms? no

## 2017-12-12 NOTE — Telephone Encounter (Signed)
Please restart her lopressor at 12.5 mg BID. Increase losartan to 50 mg daily and continue checking her blood pressure daily.

## 2017-12-12 NOTE — Telephone Encounter (Signed)
Spoke with pt and advised of Fabian Sharp, [place] recommendation. Pt verbalized understanding.

## 2017-12-12 NOTE — Telephone Encounter (Signed)
Spoke with Pt who reports she was seen on 12/08/17 and at that time she was started on losartan 25 mg, amlodipine was decreased to 5 mg and metoprolol was stopped. She reports that her HR has since increased ranging from 95-105 at rest. BP reading for today was 151/73 and 162/75. She also has c/o feeling jittery/shaky. Routing to [employer], [place] and [place], [place] for recommendations.

## 2017-12-12 NOTE — Addendum Note (Signed)
Addended by: Meryl Crutch on: 12/12/2017 04:12 PM   Modules accepted: Orders

## 2017-12-16 ENCOUNTER — Encounter (HOSPITAL_COMMUNITY): Payer: Self-pay | Admitting: Emergency Medicine

## 2017-12-16 ENCOUNTER — Emergency Department (HOSPITAL_COMMUNITY)
Admission: EM | Admit: 2017-12-16 | Discharge: 2017-12-16 | Disposition: A | Discharge status: Home / Self Care | Payer: Medicare Other | Attending: Emergency Medicine | Admitting: Emergency Medicine

## 2017-12-16 DIAGNOSIS — Z7982 Long term (current) use of aspirin: Secondary | ICD-10-CM | POA: Insufficient documentation

## 2017-12-16 DIAGNOSIS — R609 Edema, unspecified: Secondary | ICD-10-CM | POA: Insufficient documentation

## 2017-12-16 DIAGNOSIS — R61 Generalized hyperhidrosis: Secondary | ICD-10-CM | POA: Diagnosis not present

## 2017-12-16 DIAGNOSIS — I10 Essential (primary) hypertension: Secondary | ICD-10-CM

## 2017-12-16 DIAGNOSIS — Z87891 Personal history of nicotine dependence: Secondary | ICD-10-CM | POA: Insufficient documentation

## 2017-12-16 DIAGNOSIS — Z79899 Other long term (current) drug therapy: Secondary | ICD-10-CM | POA: Insufficient documentation

## 2017-12-16 LAB — CBC WITH DIFFERENTIAL/PLATELET
ABS IMMATURE GRANULOCYTES: 0 10*3/uL (ref 0.0–0.1)
BASOS PCT: 1 %
Basophils Absolute: 0.1 10*3/uL (ref 0.0–0.1)
Eosinophils Absolute: 0.2 10*3/uL (ref 0.0–0.7)
Eosinophils Relative: 4 %
HCT: 41.9 % (ref 36.0–46.0)
HEMOGLOBIN: 13.8 g/dL (ref 12.0–15.0)
IMMATURE GRANULOCYTES: 0 %
LYMPHS ABS: 1.7 10*3/uL (ref 0.7–4.0)
LYMPHS PCT: 26 %
MCH: 30.3 pg (ref 26.0–34.0)
MCHC: 32.9 g/dL (ref 30.0–36.0)
MCV: 92.1 fL (ref 78.0–100.0)
MONO ABS: 0.6 10*3/uL (ref 0.1–1.0)
MONOS PCT: 9 %
NEUTROS ABS: 4 10*3/uL (ref 1.7–7.7)
Neutrophils Relative %: 60 %
Platelets: 291 10*3/uL (ref 150–400)
RBC: 4.55 MIL/uL (ref 3.87–5.11)
RDW: 13.1 % (ref 11.5–15.5)
WBC: 6.5 10*3/uL (ref 4.0–10.5)

## 2017-12-16 LAB — BASIC METABOLIC PANEL
ANION GAP: 12 (ref 5–15)
BUN/Creatinine Ratio: 14 (ref 12–28)
BUN: 11 mg/dL (ref 8–27)
BUN: 5 mg/dL — AB (ref 8–23)
CALCIUM: 9.7 mg/dL (ref 8.7–10.3)
CO2: 24 mmol/L (ref 20–29)
CO2: 23 mmol/L (ref 22–32)
CREATININE: 0.76 mg/dL (ref 0.57–1.00)
Calcium: 9.3 mg/dL (ref 8.9–10.3)
Chloride: 102 mmol/L (ref 96–106)
Chloride: 103 mmol/L (ref 98–111)
Creatinine, Ser: 0.72 mg/dL (ref 0.44–1.00)
GFR calc Af Amer: >60 mL/min (ref >60)
GFR calc non Af Amer: >60 mL/min (ref >60)
GFR, EST AFRICAN AMERICAN: 82 mL/min/{1.73_m2} (ref >59)
GFR, EST NON AFRICAN AMERICAN: 71 mL/min/{1.73_m2} (ref >59)
GLUCOSE: 106 mg/dL — AB (ref 70–99)
Glucose: 102 mg/dL — ABNORMAL HIGH (ref 65–99)
POTASSIUM: 4.5 mmol/L (ref 3.5–5.2)
POTASSIUM: 3.5 mmol/L (ref 3.5–5.1)
Sodium: 139 mmol/L (ref 134–144)
Sodium: 138 mmol/L (ref 135–145)

## 2017-12-16 NOTE — ED Provider Notes (Signed)
[facility] EMERGENCY DEPARTMENT Provider Note   CSN: [id number] Arrival date & time: 12/16/17  0450     History   Chief Complaint Chief Complaint  Patient presents with  . Hypertension    HPI Mary Merritt is a 84 y.o. female.  HPI  This is an 84 year old female with a history of anxiety, hypertension, hyperlipidemia who presents with hypertension.  Patient reports that she had some adjustment in her blood pressure medications on Friday.  Her amlodipine was decreased and she was started on losartan.  She states that since that time she has noted her blood pressures at home to be in the 160 range.  This morning she did "not feel well" and took her blood pressure.  Systolic was 856.  She denies any headache, chest pain, nausea, vomiting.  She did feel sweaty.  She has not had any recent fevers or infectious symptoms.  Patient states that she is taking losartan, amlodipine, and metoprolol for her blood pressures.  She attempted to titrate her metoprolol but reported that she got palpitations.  She currently reports that she feels much better.  She is without chest pain or shortness of breath or any active symptoms at this time.  I have reviewed the patient's chart.  She has a history of bradycardia associated with metoprolol as well as poorly controlled hypertension.  She has had multiple medication changes recently as she had an admission for hyponatremia.  Medications have been titrated.  Based on chart review, metoprolol was discontinued early last week and losartan was added.  However, patient had palpitations and hypertension at home.  Low-dose metoprolol was restarted and losartan was increased to 50.  Patient confirms dosing.  Past Medical History:  Diagnosis Date  . Anxiety   . Arthritis   . Dysrhythmia    bradycardia  . History of bronchitis   . Hyperlipidemia   . Hypertension   . Seasonal allergies     Patient Active Problem List   Diagnosis Date Noted    . Hyponatremia 11/22/2017  . Nondisplaced fracture of left tibial spine, subsequent encounter for closed fracture with routine healing 08/05/2017  . Chronic pain of left knee 07/21/2017  . Presence of left artificial hip joint 05/30/2016  . Hip joint replacement status 03/27/2016  . S/P hip replacement 07/27/2015  . Osteoarthritis of right hip 08/01/2014  . Essential hypertension 05/31/2014  . Mixed hyperlipidemia 05/31/2014  . S/P AAA repair 05/31/2014  . Bradycardia 05/31/2014  . Murmur 05/31/2014    Past Surgical History:  Procedure Laterality Date  . ABDOMINAL AORTIC ANEURYSM REPAIR     2009, Dr. Scot Dock  . CARDIAC CATHETERIZATION     patient thinks it was in 09 with dr Scot Dock  . cataracts removed    . INCONTINENCE SURGERY    . TOTAL HIP ARTHROPLASTY Right 08/01/2014   Procedure: RIGHT TOTAL HIP ARTHROPLASTY ANTERIOR APPROACH;  Surgeon: Marianna Payment, MD;  Location: [place];  Service: Orthopedics;  Laterality: Right;  . TOTAL HIP ARTHROPLASTY Left 03/27/2016   Procedure: LEFT TOTAL HIP ARTHROPLASTY ANTERIOR APPROACH;  Surgeon: Leandrew Koyanagi, MD;  Location: [place];  Service: Orthopedics;  Laterality: Left;     OB History   None      Home Medications    Prior to Admission medications   Medication Sig Start Date End Date Taking? Authorizing Provider  amLODipine (NORVASC) 5 MG tablet Take 1 tablet (5 mg total) by mouth daily. 12/08/17  Yes Fabian Sharp  Elmyra Ricks, [place]  aspirin EC 81 MG tablet Take 81 mg by mouth daily.   Yes [provider]  cetirizine (ZYRTEC) 10 MG tablet Take 10 mg by mouth at bedtime.    Yes [provider]  losartan (COZAAR) 50 MG tablet Take 1 tablet (50 mg total) by mouth daily. 12/12/17 03/12/18 Yes [facility], Tami Lin, PA  lovastatin (MEVACOR) 20 MG tablet Take 20 mg by mouth at bedtime.    Yes [provider]  metoprolol tartrate (LOPRESSOR) 25 MG tablet Take 0.5 tablets (12.5 mg total) by mouth 2 (two) times daily. 12/12/17  03/12/18 Yes [facility], Tami Lin, PA  Omega-3 Fatty Acids (FISH OIL) 1000 MG CAPS Take 2,000 mg by mouth at bedtime.    Yes [provider]  TURMERIC PO Take 1 capsule by mouth daily.   Yes [provider]    Family History Family History  Problem Relation Age of Onset  . Heart failure Father   . Stroke Mother     Social History Social History   Tobacco Use  . Smoking status: Former Smoker    Packs/day: 1.00    Years: 30.00    Pack years: 30.00    Last attempt to quit: 05/31/1984    Years since quitting: 33.5  . Smokeless tobacco: Never Used  Substance Use Topics  . Alcohol use: No    Alcohol/week: 0.0 oz  . Drug use: No     Allergies   Percocet [oxycodone-acetaminophen]   Review of Systems Review of Systems  Constitutional: Positive for diaphoresis. Negative for fever.  Respiratory: Negative for shortness of breath.   Cardiovascular: Negative for chest pain.  Gastrointestinal: Negative for abdominal pain, diarrhea, nausea and vomiting.  Genitourinary: Negative for dysuria.  Musculoskeletal: Negative for back pain.  Skin: Negative for rash.  Neurological: Negative for headaches.  All other systems reviewed and are negative.    Physical Exam Updated Vital Signs BP (!) 169/73   Pulse (!) 58   Temp 98.4 F (36.9 C) (Oral)   Resp 11   Ht 5\' 5"  (1.651 m)   Wt 74.8 kg (165 lb)   SpO2 95%   BMI 27.46 kg/m   Physical Exam  Constitutional: She is oriented to person, place, and time. She appears well-developed and well-nourished.  Elderly, nontoxic-appearing  HENT:  Head: Normocephalic and atraumatic.  Eyes: Pupils are equal, round, and reactive to light.  Cardiovascular: Normal rate, regular rhythm and normal heart sounds.  No murmur heard. Pulmonary/Chest: Effort normal and breath sounds normal. No respiratory distress. She has no wheezes.  Abdominal: Soft. Bowel sounds are normal.  Musculoskeletal:  Trace bilateral lower extremity edema   Neurological: She is alert and oriented to person, place, and time.  Skin: Skin is warm and dry.  Psychiatric: She has a normal mood and affect.  Nursing note and vitals reviewed.    ED Treatments / Results  Labs (all labs ordered are listed, but only abnormal results are displayed) Labs Reviewed  BASIC METABOLIC PANEL - Abnormal; Notable for the following components:      Result Value   Glucose, Bld 106 (*)    BUN 5 (*)    All other components within normal limits  CBC WITH DIFFERENTIAL/PLATELET    EKG EKG Interpretation  Date/Time:  Tuesday December 16 2017 05:20:30 EDT Ventricular Rate:  67 PR Interval:    QRS Duration: 95 QT Interval:  416 QTC Calculation: 440 R Axis:   -20 Text Interpretation:  Sinus rhythm  Borderline left axis deviation Low voltage, precordial leads Consider anterior infarct No significant change since last tracing Confirmed by Thayer Jew [phone]) on 12/16/2017 5:43:25 AM   Radiology No results found.  Procedures Procedures (including critical care time)  Medications Ordered in ED Medications - No data to display   Initial Impression / Assessment and Plan / ED Course  I have reviewed the triage vital signs and the nursing notes.  Pertinent labs & imaging results that were available during my care of the patient were reviewed by me and considered in my medical decision making (see chart for details).     Patient presents with concerns for high blood pressure.  States that she woke up feeling bad this morning.  She denies chest pain or headache.  She is overall nontoxic-appearing.  Initially hypertensive to 193/87.  However on multiple rechecks she now is 150s to 160s over 70s.  She states that this is been her recent normal.  She has not had any symptoms while in the ED and her exam is reassuring.  Doubt hypertensive urgency or emergency.  EKG is nonischemic.  Basic lab work-up is largely reassuring.  I hesitate to make any further titration in  her blood pressure medications given recent multiple changes.  I encouraged her to call Dr. Lysbeth Penner office today for a close follow-up and further discussion regarding titration of medication.  She was advised that if she develops chest pain, shortness of breath, headache, any new or worsening symptoms she should be reevaluated.  After history, exam, and medical workup I feel the patient has been appropriately medically screened and is safe for discharge home. Pertinent diagnoses were discussed with the patient. Patient was given return precautions.  Final Clinical Impressions(s) / ED Diagnoses   Final diagnoses:  Essential hypertension    ED Discharge Orders    None       Merryl Hacker, MD 12/16/17 [phone]

## 2017-12-16 NOTE — Discharge Instructions (Addendum)
He was seen today for concerns for high blood pressure.  Your work-up is reassuring.  You need to follow-up closely with Dr. Debara Pickett in his office.  You have had multiple recent changes in your medications.  If you develop chest pain, shortness of breath, headache, any new or worsening symptoms you should be reevaluated.

## 2017-12-16 NOTE — ED Triage Notes (Addendum)
Pt reports hypertension since Friday, reports recent change in BP meds. States this morning it was 190/140. Denies pain, blurred vision but states she doesn't feel good.

## 2017-12-16 NOTE — Progress Notes (Signed)
Cardiology Office Note   Date:  12/17/2017   ID:  Mary Merritt, DOB 10-21-1927, MRN [id number]  PCP:  Katherina Mires, MD  Cardiologist: Dr. Debara Pickett   Chief Complaint  Patient presents with  . Follow-up  . Hypertension     History of Present Illness: Mary Merritt is a 84 y.o. female who presents for ongoing assessment and management of hypertension, hyperlipidemia, history of AAA repair, and bradycardia.  The patient was last seen in the office by Fabian Sharp, PA, on 12/08/2017 for posthospitalization follow-up after admission for hyponatremia.  Chlorthalidone was discontinued.  She was also treated for pneumonia on that follow-up visit she complained of lower extremity edema, trouble swallowing Lopressor and cutting them in half to difficult for her.  During the office visit amlodipine was decreased to 5 mg daily blood pressure was discontinued and losartan was started at 25 mg daily.  She called our office on 12/12/2017 complaining of a racing heart rate and stating that it ran between 95 bpm and 105 bpm.  Fabian Sharp, PA, restarted her on Lopressor 12.5 mg twice daily, and she was to increase her losartan to 50 mg daily.  The patient was to keep track of her blood pressures and bring them with her on follow-up appointment.  She is very anxious. States she worries all of the time especially about her health and her BP. Was in the ER 12/16/2017 due to elevated BP which normalized on its own without intervention. Labs were completed. Potassium was WNL at 3.5, creatinine 0.72. She was not found to be anemic. She states she takes her BP several times a day and becomes more anxious each time she takes it.   Past Medical History:  Diagnosis Date  . Anxiety   . Arthritis   . Dysrhythmia    bradycardia  . History of bronchitis   . Hyperlipidemia   . Hypertension   . Seasonal allergies     Past Surgical History:  Procedure Laterality Date  . ABDOMINAL AORTIC ANEURYSM REPAIR     2009, Dr.  Scot Dock  . CARDIAC CATHETERIZATION     patient thinks it was in 09 with dr Scot Dock  . cataracts removed    . INCONTINENCE SURGERY    . TOTAL HIP ARTHROPLASTY Right 08/01/2014   Procedure: RIGHT TOTAL HIP ARTHROPLASTY ANTERIOR APPROACH;  Surgeon: Marianna Payment, MD;  Location: [facility];  Service: Orthopedics;  Laterality: Right;  . TOTAL HIP ARTHROPLASTY Left 03/27/2016   Procedure: LEFT TOTAL HIP ARTHROPLASTY ANTERIOR APPROACH;  Surgeon: Leandrew Koyanagi, MD;  Location: [facility];  Service: Orthopedics;  Laterality: Left;     Current Outpatient Medications  Medication Sig Dispense Refill  . amLODipine (NORVASC) 10 MG tablet Take 1 tablet (10 mg total) by mouth daily. 30 tablet 5  . aspirin EC 81 MG tablet Take 81 mg by mouth daily.    . cetirizine (ZYRTEC) 10 MG tablet Take 10 mg by mouth at bedtime.     Marland Kitchen losartan (COZAAR) 50 MG tablet Take 1 tablet (50 mg total) by mouth daily. 90 tablet 3  . lovastatin (MEVACOR) 20 MG tablet Take 20 mg by mouth at bedtime.     . metoprolol tartrate (LOPRESSOR) 25 MG tablet Take 0.5 tablets (12.5 mg total) by mouth 2 (two) times daily. 180 tablet 3  . Omega-3 Fatty Acids (FISH OIL) 1000 MG CAPS Take 2,000 mg by mouth at bedtime.     . TURMERIC PO Take 1 capsule  by mouth daily.     No current facility-administered medications for this visit.     Allergies:   Percocet [oxycodone-acetaminophen]    Social History:  The patient  reports that she quit smoking about 33 years ago. She has a 30.00 pack-year smoking history. She has never used smokeless tobacco. She reports that she does not drink alcohol or use drugs.   Family History:  The patient's family history includes Heart failure in her father; Stroke in her mother.    ROS: All other systems are reviewed and negative. Unless otherwise mentioned in H&P    PHYSICAL EXAM: VS:  BP (!) 190/84   Pulse 68   Ht 5\' 5"  (1.651 m)   Wt 159 lb 12.8 oz (72.5 kg)   BMI 26.59 kg/m  , BMI Body mass index is 26.59  kg/m. GEN: Well nourished, well developed, in no acute distress  HEENT: normal  Neck: no JVD, carotid bruits, or masses Cardiac: RRR soft S4, ; no murmurs, rubs, or gallops,no edema  Respiratory:  clear to auscultation bilaterally, normal work of breathing GI: soft, nontender, nondistended, + BS MS: no deformity or atrophy  Skin: warm and dry, no rash Neuro:  Strength and sensation are intact Psych: euthymic mood, full affect, anxious    EKG:  Not completed during this office visit.   Recent Labs: 11/22/2017: ALT 14; Magnesium 1.1 11/23/2017: TSH 2.445 12/16/2017: BUN 5; Creatinine, Ser 0.72; Hemoglobin 13.8; Platelets 291; Potassium 3.5; Sodium 138    Lipid Panel No results found for: CHOL, TRIG, HDL, CHOLHDL, VLDL, LDLCALC, LDLDIRECT    Wt Readings from Last 3 Encounters:  12/17/17 159 lb 12.8 oz (72.5 kg)  12/16/17 165 lb (74.8 kg)  12/08/17 161 lb 6.4 oz (73.2 kg)      Other studies Reviewed: Echocardiogram  06/21/2014 Left ventricle: The cavity size was normal. Wall thickness was normal. Systolic function was normal. The estimated ejection fraction was in the range of 60% to 65%. Doppler parameters are consistent with abnormal left ventricular relaxation (grade 1 diastolic dysfunction). - Pericardium, extracardiac: A trivial pericardial effusion was identified.  ASSESSMENT AND PLAN:  1. Hypertension: She is quite anxious about this and takes her blood pressure often throughout the day. She was documented to have hypertension during ER visit with normal HR. She is hypertensive today. I will increase her amlodipine to 10 mg in the am. She has a lot of 5 mg tablets, and therefore will take two tablets each am to equal 10 mg. She will continue metoprolol 12.5 mg BID as directed. I will have a 24 hour ambulatory BP device placed. She will have BP check in a couple of weeks. I will see her again in one month.   2. Anxiety about health: I have advised her to speak  with her PCP about possibility of low dose anti-anxiety medication if PCP feels this will be helpful to her.   3. Hyperlipidemia: Currently not on statin. This will need to be checked on next office visit.   Current medicines are reviewed at length with the patient today.    Labs/ tests ordered today include: none  Phill Myron. West Pugh, ANP, AACC   12/17/2017 11:35 AM    [place]. [address] [place], [place] [address] Phone: [phone]; Fax: [phone]

## 2017-12-17 ENCOUNTER — Ambulatory Visit: Payer: Medicare Other | Admitting: Adult Health

## 2017-12-17 ENCOUNTER — Encounter: Payer: Self-pay | Admitting: Adult Health

## 2017-12-17 VITALS — BP 190/84 | HR 68 | Ht 65.0 in | Wt 159.8 lb

## 2017-12-17 DIAGNOSIS — I10 Essential (primary) hypertension: Secondary | ICD-10-CM

## 2017-12-17 DIAGNOSIS — F418 Other specified anxiety disorders: Secondary | ICD-10-CM | POA: Diagnosis not present

## 2017-12-17 MED ORDER — AMLODIPINE BESYLATE 10 MG PO TABS: 10.0000 mg | ORAL_TABLET | Freq: Every day | ORAL | 5 refills | Status: DC

## 2017-12-17 NOTE — Patient Instructions (Signed)
Medication Instructions:  INCREASE AMLODIPINE 10MG  DAILY  If you need a refill on your cardiac medications before your next appointment, please call your pharmacy.  Testing/Procedures: Your physician has recommended that you wear an 24 HOUR BP monitor(please call and schedule [facility]). This will be performed at our [facility] location - [address], [address].  Event monitors are medical devices that record the heart's electrical activity. Doctors most often [place] these monitors to diagnose arrhythmias. Arrhythmias are problems with the speed or rhythm of the heartbeat. The monitor is a small, portable device. You can wear one while you do your normal daily activities. This is usually used to diagnose what is causing palpitations/syncope (passing out).  Follow-Up: Your physician wants you to follow-up in: 2 [place] BP CHECK Your physician wants you to follow-up in: [place] (NURSE PRACTIONIER), DNP,AACC IF PRIMARY CARDIOLOGIST IS UNAVAILABLE.    Thank you for choosing [facility] at [facility]!!

## 2018-01-08 ENCOUNTER — Ambulatory Visit (INDEPENDENT_AMBULATORY_CARE_PROVIDER_SITE_OTHER): Payer: Medicare Other | Admitting: Pharmacist

## 2018-01-08 VITALS — BP 160/74 | HR 54

## 2018-01-08 DIAGNOSIS — I10 Essential (primary) hypertension: Secondary | ICD-10-CM

## 2018-01-08 MED ORDER — HYDROCHLOROTHIAZIDE 25 MG PO TABS: 25.0000 mg | ORAL_TABLET | ORAL | 1 refills | Status: DC

## 2018-01-08 NOTE — Patient Instructions (Addendum)
Return for a follow up appointment in 2 weeks with K. Lawrance  Check your blood pressure at home daily (if able) and keep record of the readings.  Take your BP meds as follows: *START taking HCTZ 25mg  every other day* - 1st dose  *Repeat BMET during next office visit*  - July/31  Exercise as you're able, try to walk approximately 30 minutes per day.  Keep salt intake to a minimum, especially watch canned and prepared boxed foods.  Eat more fresh fruits and vegetables and fewer canned items.  Avoid eating in fast food restaurants.    HOW TO TAKE YOUR BLOOD PRESSURE: . Rest 5 minutes before taking your blood pressure. .  Don't smoke or drink caffeinated beverages for at least 30 minutes before. . Take your blood pressure before (not after) you eat. . Sit comfortably with your back supported and both feet on the floor (don't cross your legs). . Elevate your arm to heart level on a table or a desk. . Use the proper sized cuff. It should fit smoothly and snugly around your bare upper arm. There should be enough room to slip a fingertip under the cuff. The bottom edge of the cuff should be 1 inch above the crease of the elbow. . Ideally, take 3 measurements at one sitting and record the average.

## 2018-01-08 NOTE — Progress Notes (Signed)
Patient ID: Mary Merritt                 DOB: 1927-10-14                      MRN: [id number]     HPI: Mary Merritt is a 84 y.o. female patient of Dr Debara Pickett referred by Arnold Long NP  to HTN clinic. PMH includes uncontrolled hypertension, osteoarthritis, hyperlipidemia, AAA repair, bradycardia, murmur and hyponatremia.  Patient's blood pressure fairly well controlled until about 2 months ago. Amlodipine dose was decreased from 10mg  to 5mg  during 6/17 office visit due to low extremity edema and losartan 25mg  was initiated for BP control. NP also discontinue metoprolol during same office visit after noticing decreased HR and no recurrent palpitations for the past few years. Mary Merritt, Mary Merritt then experienced severe anxiety after experiencing an episode of "palpitations" and elevated blood pressure. On follow up her metoprolol was resumed at 12.5mg  twice daily and amlodipine increased back to 10mg  daily.   Patient presents today for HTN clinic evaluation and medication titration. She is somehow reluctant to change medications at this time due to recent events. She recently started on escitalopram 10mg  by PCP to manage her anxiety. Patient reports feeling "a lot better", is not checking her BP at home to avoid anxiety, and denies chest pain, dizziness or blurry vision. Her low extremity edema is back after increasing amlodipineto 10mg  daily but she is reluctant to decrease the dose during this visit. Also noted, patient was on HCTZ 25mg  daily for over 3 years without any problems. HCTZ was discontinued after recent hospital admission due to hyponatremia. Cause of her hyponatremia was determined to be antibiotic related, but her diuretics was never resumed. Recent BMET, from 3 weeks ago, shows Scr and electrolytes within normal limits.  Current HTN meds:  Amlodipine 10mg  daily Losartan 50mg  dily Metoprolol tartrate 12.5mg  twice daily  BP goal: 130/80  Family History: Heart failure in her father; Stroke in her  mother.  Social History: she quit smoking about 33 years ago. She has a 30.00 pack-year smoking history. She has never used smokeless tobacco. She reports that she does not drink alcohol or use drugs.   Diet: family history includes Heart failure in her father; Stroke in her mother.   Exercise: stationary bike at home. Uses daily for ~30 minutes  Home BP readings: none provided today. No checking her BP at home to avoid anxiety  Wt Readings from Last 3 Encounters:  12/17/17 159 lb 12.8 oz (72.5 kg)  12/16/17 165 lb (74.8 kg)  12/08/17 161 lb 6.4 oz (73.2 kg)   BP Readings from Last 3 Encounters:  01/08/18 (!) 160/74  12/17/17 (!) 190/84  12/16/17 (!) 169/73   Pulse Readings from Last 3 Encounters:  01/08/18 (!) 54  12/17/17 68  12/16/17 (!) 58    Past Medical History:  Diagnosis Date  . Anxiety   . Arthritis   . Dysrhythmia    bradycardia  . History of bronchitis   . Hyperlipidemia   . Hypertension   . Seasonal allergies     Current Outpatient Medications on File Prior to Visit  Medication Sig Dispense Refill  . amLODipine (NORVASC) 10 MG tablet Take 1 tablet (10 mg total) by mouth daily. 30 tablet 5  . aspirin EC 81 MG tablet Take 81 mg by mouth daily.    . cetirizine (ZYRTEC) 10 MG tablet Take 10 mg by mouth at bedtime.     Marland Kitchen  escitalopram (LEXAPRO) 10 MG tablet Take 10 mg by mouth daily.    Marland Kitchen losartan (COZAAR) 50 MG tablet Take 1 tablet (50 mg total) by mouth daily. 90 tablet 3  . lovastatin (MEVACOR) 20 MG tablet Take 20 mg by mouth at bedtime.     . metoprolol tartrate (LOPRESSOR) 25 MG tablet Take 0.5 tablets (12.5 mg total) by mouth 2 (two) times daily. 180 tablet 3  . Omega-3 Fatty Acids (FISH OIL) 1000 MG CAPS Take 2,000 mg by mouth at bedtime.     . TURMERIC PO Take 1 capsule by mouth daily.     No current facility-administered medications on file prior to visit.     Allergies  Allergen Reactions  . Percocet [Oxycodone-Acetaminophen] Other (See  Comments)    Makes head feel funny    Blood pressure (!) 160/74, pulse (!) 54.  Essential hypertension Blood pressure remains elevated but shows significant improvement since last office visit. Patient is reluctant to decrease amlodipine today. Will add HCTZ 25mg  every other day to her therapy to manage BP and improve edema. Plan to decrease amlodipine to 5mg  daily and change losartan to valsartan 80mg  during next office visit. Patient blames losartan for her increase palpitations and will like to stop this medication but will need additional BP control once amlodipine dose is decreased to 5mg .  She already has a follow up appointment to get 24 hours BP monitor and f/u with NP 2 weeks after. Will schedule f/u with HTN clinic as needed.   Mary Merritt PharmD, BCPS, [place] [id number] 01/09/2018 8:31 AM

## 2018-01-09 ENCOUNTER — Ambulatory Visit (INDEPENDENT_AMBULATORY_CARE_PROVIDER_SITE_OTHER): Payer: Medicare Other

## 2018-01-09 ENCOUNTER — Encounter: Payer: Self-pay | Admitting: Pharmacist

## 2018-01-09 DIAGNOSIS — I10 Essential (primary) hypertension: Secondary | ICD-10-CM | POA: Diagnosis not present

## 2018-01-09 NOTE — Assessment & Plan Note (Signed)
Blood pressure remains elevated but shows significant improvement since last office visit. Patient is reluctant to decrease amlodipine today. Will add HCTZ 25mg  every other day to her therapy to manage BP and improve edema. Plan to decrease amlodipine to 5mg  daily and change losartan to valsartan 80mg  during next office visit. Patient blames losartan for her increase palpitations and will like to stop this medication but will need additional BP control once amlodipine dose is decreased to 5mg .  She already has a follow up appointment to get 24 hours BP monitor and f/u with NP 2 weeks after. Will schedule f/u with HTN clinic as needed.

## 2018-01-14 ENCOUNTER — Telehealth: Payer: Self-pay | Admitting: Internal Medicine

## 2018-01-14 NOTE — Telephone Encounter (Signed)
Received copy of 24 hr BP monitor ordered by K. Lawrence, DNP  Both she and Dr. Debara Pickett reviewed - BP during sleep time noted to be higher but no med changes recommended.   Patient called with this info, advised that she follow the recommendations she received at 01/09/18 office visit with clinical pharmacist for BP management. She verbalized understanding.

## 2018-01-19 NOTE — Progress Notes (Signed)
Cardiology Office Note   Date:  01/21/2018   ID:  Mary Merritt, DOB February 05, 1928, MRN [id number]  PCP:  Katherina Mires, MD  Cardiologist: Dr. Debara Pickett   Chief Complaint  Patient presents with  . Follow-up    one month, repeat lab work     History of Present Illness: Mary Merritt is a 84 y.o. female who presents for ongoing assessment and management of HTN, hyperlipidemia, hx of AAA repair, and bradycardia.  She was last seen in the office on 12/17/2017 after medication adjustments for racing HR, with lopressor restarted on previous appointment by Fabian Sharp. She was again in the ER due to anxiety and racing heart rate. She takes her BP daily.   On last office visit she was anxious and hypertensive. I did not change her medications as she was upset. I had an ambulatory BP device placed to evaluate home BP's when she is not anxious. She did not have significant BP elevations but was noted to have some elevations during sleep. She was to follow recommendations by addition of HCTZ 25 mg every other day and decrease amlodipine to 5 mg daily, change losartan to valsartan 80 mg.   She comes today feeling much better. BP at home is in the 123/60 range. She is not having any significant dizziness, except if she stands too quickly. She denies chest pressure. She is feeling more energetic.   Past Medical History:  Diagnosis Date  . Anxiety   . Arthritis   . Dysrhythmia    bradycardia  . History of bronchitis   . Hyperlipidemia   . Hypertension   . Seasonal allergies     Past Surgical History:  Procedure Laterality Date  . ABDOMINAL AORTIC ANEURYSM REPAIR     2009, Dr. Scot Dock  . CARDIAC CATHETERIZATION     patient thinks it was in 09 with dr Scot Dock  . cataracts removed    . INCONTINENCE SURGERY    . TOTAL HIP ARTHROPLASTY Right 08/01/2014   Procedure: RIGHT TOTAL HIP ARTHROPLASTY ANTERIOR APPROACH;  Surgeon: Marianna Payment, MD;  Location: [facility];  Service: Orthopedics;  Laterality: Right;    . TOTAL HIP ARTHROPLASTY Left 03/27/2016   Procedure: LEFT TOTAL HIP ARTHROPLASTY ANTERIOR APPROACH;  Surgeon: Leandrew Koyanagi, MD;  Location: [facility];  Service: Orthopedics;  Laterality: Left;     Current Outpatient Medications  Medication Sig Dispense Refill  . amLODipine (NORVASC) 10 MG tablet Take 1 tablet (10 mg total) by mouth daily. 30 tablet 5  . aspirin EC 81 MG tablet Take 81 mg by mouth daily.    . cetirizine (ZYRTEC) 10 MG tablet Take 10 mg by mouth at bedtime.     Marland Kitchen escitalopram (LEXAPRO) 10 MG tablet Take 10 mg by mouth daily.    . hydrochlorothiazide (HYDRODIURIL) 25 MG tablet Take 1 tablet (25 mg total) by mouth every other day. 15 tablet 1  . losartan (COZAAR) 50 MG tablet Take 1 tablet (50 mg total) by mouth daily. 90 tablet 3  . lovastatin (MEVACOR) 20 MG tablet Take 20 mg by mouth at bedtime.     . metoprolol tartrate (LOPRESSOR) 25 MG tablet Take 0.5 tablets (12.5 mg total) by mouth 2 (two) times daily. 180 tablet 3  . Omega-3 Fatty Acids (FISH OIL) 1000 MG CAPS Take 2,000 mg by mouth at bedtime.     . TURMERIC PO Take 1 capsule by mouth daily.     No current facility-administered medications for this  visit.     Allergies:   Percocet [oxycodone-acetaminophen]    Social History:  The patient  reports that she quit smoking about 33 years ago. She has a 30.00 pack-year smoking history. She has never used smokeless tobacco. She reports that she does not drink alcohol or use drugs.   Family History:  The patient's family history includes Heart failure in her father; Stroke in her mother.    ROS: All other systems are reviewed and negative. Unless otherwise mentioned in H&P    PHYSICAL EXAM: VS:  BP (!) 142/72 (BP Location: Right Arm, Patient Position: Sitting)   Pulse (!) 105   Ht 5\' 5"  (1.651 m)   Wt 158 lb 9.6 oz (71.9 kg)   SpO2 98%   BMI 26.39 kg/m  , BMI Body mass index is 26.39 kg/m. GEN: Well nourished, well developed, in no acute distress  HEENT: normal   Neck: no JVD, carotid bruits, or masses Cardiac: RRR; no murmurs, rubs, or gallops,no edema  Respiratory:  clear to auscultation bilaterally, normal work of breathing GI: soft, nontender, nondistended, + BS MS: no deformity or atrophy  Skin: warm and dry, no rash Neuro:  Strength and sensation are intact Psych: euthymic mood, full affect   EKG:  Not completed this office visit.   Recent Labs: 11/22/2017: ALT 14; Magnesium 1.1 11/23/2017: TSH 2.445 12/16/2017: BUN 5; Creatinine, Ser 0.72; Hemoglobin 13.8; Platelets 291; Potassium 3.5; Sodium 138    Lipid Panel No results found for: CHOL, TRIG, HDL, CHOLHDL, VLDL, LDLCALC, LDLDIRECT    Wt Readings from Last 3 Encounters:  01/21/18 158 lb 9.6 oz (71.9 kg)  12/17/17 159 lb 12.8 oz (72.5 kg)  12/16/17 165 lb (74.8 kg)      Other studies Reviewed:  Echocardiogram 06/21/2014 Left ventricle: The cavity size was normal. Wall thickness was normal. Systolic function was normal. The estimated ejection fraction was in the range of 60% to 65%. Doppler parameters are consistent with abnormal left ventricular relaxation (grade 1 diastolic dysfunction). - Pericardium, extracardiac: A trivial pericardial effusion was identified.   ASSESSMENT AND PLAN:  1.  Hypertension: Much better controlled on medication adjustments. She is feeling better as well. BP is lower at home compared to office visit. Will continue her on current regimen as she has responded so well. Recheck BMET and Mg+.  2. Tachycardia: HR is well controlled currently. No changes in her BB dose.   3.Anxiety: Better with BP control. See PCP for additional recommendations should this be necessary.    Current medicines are reviewed at length with the patient today.    Labs/ tests ordered today include: BMET and Mg.   Phill Myron. West Pugh, ANP, AACC   01/21/2018 10:50 AM    [place]. [address] [place], [place] [address] Phone:  [phone]; Fax: [phone]

## 2018-01-21 ENCOUNTER — Encounter: Payer: Self-pay | Admitting: Adult Health

## 2018-01-21 ENCOUNTER — Encounter (HOSPITAL_BASED_OUTPATIENT_CLINIC_OR_DEPARTMENT_OTHER): Payer: Self-pay

## 2018-01-21 ENCOUNTER — Ambulatory Visit: Payer: Medicare Other | Admitting: Adult Health

## 2018-01-21 ENCOUNTER — Other Ambulatory Visit: Payer: Self-pay

## 2018-01-21 ENCOUNTER — Emergency Department (HOSPITAL_BASED_OUTPATIENT_CLINIC_OR_DEPARTMENT_OTHER)
Admission: EM | Admit: 2018-01-21 | Discharge: 2018-01-21 | Disposition: A | Discharge status: Home / Self Care | Payer: Medicare Other | Attending: Emergency Medicine | Admitting: Emergency Medicine

## 2018-01-21 VITALS — BP 142/72 | HR 105 | Ht 65.0 in | Wt 158.6 lb

## 2018-01-21 DIAGNOSIS — I10 Essential (primary) hypertension: Secondary | ICD-10-CM | POA: Diagnosis not present

## 2018-01-21 DIAGNOSIS — Z79899 Other long term (current) drug therapy: Secondary | ICD-10-CM

## 2018-01-21 DIAGNOSIS — Z87891 Personal history of nicotine dependence: Secondary | ICD-10-CM | POA: Insufficient documentation

## 2018-01-21 DIAGNOSIS — R000 Tachycardia, unspecified: Secondary | ICD-10-CM

## 2018-01-21 DIAGNOSIS — Z7982 Long term (current) use of aspirin: Secondary | ICD-10-CM | POA: Insufficient documentation

## 2018-01-21 DIAGNOSIS — Z96643 Presence of artificial hip joint, bilateral: Secondary | ICD-10-CM | POA: Diagnosis not present

## 2018-01-21 DIAGNOSIS — R790 Abnormal level of blood mineral: Secondary | ICD-10-CM | POA: Diagnosis present

## 2018-01-21 DIAGNOSIS — F419 Anxiety disorder, unspecified: Secondary | ICD-10-CM | POA: Insufficient documentation

## 2018-01-21 LAB — BASIC METABOLIC PANEL
BUN/Creatinine Ratio: 19 (ref 12–28)
BUN: 16 mg/dL (ref 8–27)
CALCIUM: 9.8 mg/dL (ref 8.7–10.3)
CO2: 24 mmol/L (ref 20–29)
Chloride: 92 mmol/L — ABNORMAL LOW (ref 96–106)
Creatinine, Ser: 0.85 mg/dL (ref 0.57–1.00)
GFR calc non Af Amer: 62 mL/min/{1.73_m2} (ref >59)
GFR, EST AFRICAN AMERICAN: 71 mL/min/{1.73_m2} (ref >59)
GLUCOSE: 101 mg/dL — AB (ref 65–99)
Potassium: 5 mmol/L (ref 3.5–5.2)
Sodium: 131 mmol/L — ABNORMAL LOW (ref 134–144)

## 2018-01-21 LAB — MAGNESIUM: MAGNESIUM: 1.3 mg/dL — AB (ref 1.6–2.3)

## 2018-01-21 MED ORDER — LOVASTATIN 20 MG PO TABS: 20.0000 mg | ORAL_TABLET | Freq: Every day | ORAL | 1 refills | Status: DC

## 2018-01-21 MED ORDER — SODIUM CHLORIDE 0.9 % IV SOLN
INTRAVENOUS | Status: DC | PRN
  Administered 2018-01-21: 1000 mL via INTRAVENOUS

## 2018-01-21 MED ORDER — MAGNESIUM SULFATE 2 GM/50ML IV SOLN
2.0000 g | Freq: Once | INTRAVENOUS | Status: AC
  Administered 2018-01-21: 2 g via INTRAVENOUS
  Filled 2018-01-21: qty 50

## 2018-01-21 MED ORDER — LOSARTAN POTASSIUM 50 MG PO TABS: 50.0000 mg | ORAL_TABLET | Freq: Every day | ORAL | 1 refills | Status: DC

## 2018-01-21 MED ORDER — AMLODIPINE BESYLATE 10 MG PO TABS: 10.0000 mg | ORAL_TABLET | Freq: Every day | ORAL | 1 refills | Status: DC

## 2018-01-21 MED ORDER — METOPROLOL TARTRATE 25 MG PO TABS: 12.5000 mg | ORAL_TABLET | Freq: Two times a day (BID) | ORAL | 1 refills | Status: DC

## 2018-01-21 MED ORDER — HYDROCHLOROTHIAZIDE 25 MG PO TABS: 25.0000 mg | ORAL_TABLET | ORAL | 1 refills | Status: DC

## 2018-01-21 NOTE — ED Provider Notes (Signed)
[facility] EMERGENCY DEPARTMENT Provider Note   CSN: [id number] Arrival date & time: 01/21/18  1731     History   Chief Complaint Chief Complaint  Patient presents with  . Abnormal Lab    HPI Mary Merritt is a 84 y.o. female.  She had a recent history of low sodium and low potassium and so she was getting some routine blood drawn at her doctor's office today.  They called her and recommended that she come here because her magnesium level was low.  They actually called me to let her know she was coming in and asked that we give her some IV magnesium.  They said when she received that that she could go home and they would arrange follow-up for her.  The patient herself does not have any complaints.  She said there is been no nausea vomiting or diarrhea no urinary symptoms.  No syncope.  She states she is been feeling easily tired.  A few months ago her magnesium was low and she tried taking oral magnesium but that caused her to feel dizzy.  The history is provided by the patient.  Abnormal Lab  Time since result:  This am Patient referred by:  PCP and specialist Result type: chemistry   Chemistry:    Other abnormal chemistry result:  Magnesium low   Past Medical History:  Diagnosis Date  . Anxiety   . Arthritis   . Dysrhythmia    bradycardia  . History of bronchitis   . Hyperlipidemia   . Hypertension   . Seasonal allergies     Patient Active Problem List   Diagnosis Date Noted  . Hyponatremia 11/22/2017  . Nondisplaced fracture of left tibial spine, subsequent encounter for closed fracture with routine healing 08/05/2017  . Chronic pain of left knee 07/21/2017  . Presence of left artificial hip joint 05/30/2016  . Hip joint replacement status 03/27/2016  . S/P hip replacement 07/27/2015  . Osteoarthritis of right hip 08/01/2014  . Essential hypertension 05/31/2014  . Mixed hyperlipidemia 05/31/2014  . S/P AAA repair 05/31/2014  . Bradycardia 05/31/2014    . Murmur 05/31/2014    Past Surgical History:  Procedure Laterality Date  . ABDOMINAL AORTIC ANEURYSM REPAIR     2009, Dr. Scot Dock  . CARDIAC CATHETERIZATION     patient thinks it was in 09 with dr Scot Dock  . cataracts removed    . INCONTINENCE SURGERY    . TOTAL HIP ARTHROPLASTY Right 08/01/2014   Procedure: RIGHT TOTAL HIP ARTHROPLASTY ANTERIOR APPROACH;  Surgeon: Marianna Payment, MD;  Location: [facility];  Service: Orthopedics;  Laterality: Right;  . TOTAL HIP ARTHROPLASTY Left 03/27/2016   Procedure: LEFT TOTAL HIP ARTHROPLASTY ANTERIOR APPROACH;  Surgeon: Leandrew Koyanagi, MD;  Location: [facility];  Service: Orthopedics;  Laterality: Left;     OB History   None      Home Medications    Prior to Admission medications   Medication Sig Start Date End Date Taking? Authorizing Provider  amLODipine (NORVASC) 10 MG tablet Take 1 tablet (10 mg total) by mouth daily. 01/21/18   Lendon Colonel, NP  aspirin EC 81 MG tablet Take 81 mg by mouth daily.    [provider]  cetirizine (ZYRTEC) 10 MG tablet Take 10 mg by mouth at bedtime.     [provider]  escitalopram (LEXAPRO) 10 MG tablet Take 10 mg by mouth daily.    [provider]  hydrochlorothiazide (HYDRODIURIL) 25  MG tablet Take 1 tablet (25 mg total) by mouth every other day. 01/21/18 04/21/18  Lendon Colonel, NP  losartan (COZAAR) 50 MG tablet Take 1 tablet (50 mg total) by mouth daily. 01/21/18 04/21/18  Lendon Colonel, NP  lovastatin (MEVACOR) 20 MG tablet Take 1 tablet (20 mg total) by mouth at bedtime. 01/21/18   Lendon Colonel, NP  metoprolol tartrate (LOPRESSOR) 25 MG tablet Take 0.5 tablets (12.5 mg total) by mouth 2 (two) times daily. 01/21/18 04/21/18  Lendon Colonel, NP  Omega-3 Fatty Acids (FISH OIL) 1000 MG CAPS Take 2,000 mg by mouth at bedtime.     [provider]  TURMERIC PO Take 1 capsule by mouth daily.    [provider]    Family History Family  History  Problem Relation Age of Onset  . Heart failure Father   . Stroke Mother     Social History Social History   Tobacco Use  . Smoking status: Former Smoker    Packs/day: 1.00    Years: 30.00    Pack years: 30.00    Last attempt to quit: 05/31/1984    Years since quitting: 33.6  . Smokeless tobacco: Never Used  Substance Use Topics  . Alcohol use: No    Alcohol/week: 0.0 oz  . Drug use: No     Allergies   Percocet [oxycodone-acetaminophen]   Review of Systems Review of Systems  Constitutional: Positive for fatigue. Negative for chills and fever.  HENT: Negative for sore throat.   Eyes: Negative for visual disturbance.  Respiratory: Negative for shortness of breath.   Cardiovascular: Negative for chest pain.  Gastrointestinal: Negative for abdominal pain.  Genitourinary: Negative for dysuria.  Musculoskeletal: Negative for neck pain.  Skin: Negative for rash.  Neurological: Negative for syncope and headaches.     Physical Exam Updated Vital Signs BP (!) 160/69   Pulse 60   Temp 98.3 F (36.8 C) (Oral)   Resp 20   Ht 5\' 6"  (1.676 m)   Wt 72.1 kg (159 lb)   SpO2 95%   BMI 25.66 kg/m   Physical Exam  Constitutional: She is oriented to person, place, and time. She appears well-developed and well-nourished.  HENT:  Head: Normocephalic and atraumatic.  Eyes: Conjunctivae are normal.  Neck: Neck supple.  Cardiovascular: Normal rate, regular rhythm, normal heart sounds and intact distal pulses.  Pulmonary/Chest: Effort normal. No stridor. She has no wheezes. She has no rales.  Abdominal: Soft. She exhibits no mass. There is no tenderness. There is no guarding.  Musculoskeletal: Normal range of motion.  Neurological: She is alert and oriented to person, place, and time. She has normal strength. Gait normal. GCS eye subscore is 4. GCS verbal subscore is 5. GCS motor subscore is 6.  Skin: Skin is warm and dry. Capillary refill takes less than 2 seconds.    Psychiatric: She has a normal mood and affect.  Nursing note and vitals reviewed.    ED Treatments / Results  Labs (all labs ordered are listed, but only abnormal results are displayed) Labs Reviewed - No data to display  EKG None  Radiology No results found.  Procedures Procedures (including critical care time)  Medications Ordered in ED Medications  magnesium sulfate IVPB 2 g 50 mL ( Intravenous Stopped 01/21/18 1924)     Initial Impression / Assessment and Plan / ED Course  I have reviewed the triage vital signs and the nursing notes.  Pertinent labs & imaging  results that were available during my care of the patient were reviewed by me and considered in my medical decision making (see chart for details).  Clinical Course as of Jan 22 1701  Wed Jan 21, 2018  1758 Reviewed the test that she had at the doctor's office.  Her magnesium was 1.3 rest of the electrolytes look fairly unremarkable.  She also had a CBC which 6 days ago that was unremarkable. doctor's office asked that we give her some IV magnesium.  Got an EKG.    [MB]    Clinical Course User Index [MB] Hayden Rasmussen, MD     Final Clinical Impressions(s) / ED Diagnoses   Final diagnoses:  Hypomagnesemia    ED Discharge Orders    None       Hayden Rasmussen, MD 01/22/18 [phone]

## 2018-01-21 NOTE — Patient Instructions (Signed)
Medication Instructions:  NO CHANGES- Your physician recommends that you continue on your current medications as directed. Please refer to the Current Medication list given to you today.  If you need a refill on your cardiac medications before your next appointment, please call your pharmacy.  Labwork: BMET AND MAG TODAY HERE IN OUR OFFICE AT [facility]  Take the provided lab slips with you to the lab for your blood draw.   Follow-Up: Your physician wants you to follow-up in: December WITH DR HILTY  You should receive a reminder letter in the mail two months in advance. If you do not receive a letter, please call our office October 2019 to schedule the December 2019 follow-up appointment.   Thank you for choosing [facility]!!

## 2018-01-21 NOTE — ED Triage Notes (Signed)
Pt states she was seen by Cards today-had labs drawn-was called approx 5pm and advised magnesium level is low-pt NAD-steady gait

## 2018-01-21 NOTE — Discharge Instructions (Signed)
You were seen in the emergency department for a low magnesium.  You were given IV magnesium.  Will be important for you to follow-up with your doctor for repeat lab work.  Please return if any concerns.

## 2018-01-21 NOTE — ED Notes (Signed)
Pt on monitor 

## 2018-01-21 NOTE — ED Notes (Signed)
Pt reports she had blood work at her doctor's office today and she was told her Magnesium is low and she needed to go to the hospital and get an infusion. Denies Sx other then "feeling a little tired"

## 2018-01-22 ENCOUNTER — Other Ambulatory Visit: Payer: Self-pay

## 2018-01-22 NOTE — Progress Notes (Signed)
Pt notified to start taking K+ supplement slo-mag and get mag lab re-drawn next week. Informed of K+ containing foods.will mail K+ list.

## 2018-01-30 LAB — BASIC METABOLIC PANEL
BUN/Creatinine Ratio: 21 (ref 12–28)
BUN: 18 mg/dL (ref 8–27)
CALCIUM: 9.3 mg/dL (ref 8.7–10.3)
CHLORIDE: 99 mmol/L (ref 96–106)
CO2: 24 mmol/L (ref 20–29)
Creatinine, Ser: 0.86 mg/dL (ref 0.57–1.00)
GFR calc Af Amer: 70 mL/min/{1.73_m2} (ref >59)
GFR calc non Af Amer: 61 mL/min/{1.73_m2} (ref >59)
GLUCOSE: 95 mg/dL (ref 65–99)
Potassium: 4.8 mmol/L (ref 3.5–5.2)
Sodium: 138 mmol/L (ref 134–144)

## 2018-01-30 LAB — MAGNESIUM: MAGNESIUM: 1.5 mg/dL — AB (ref 1.6–2.3)

## 2018-02-26 ENCOUNTER — Telehealth: Payer: Self-pay | Admitting: Adult Health

## 2018-02-26 NOTE — Telephone Encounter (Signed)
Follow up:    Patient calling about a

## 2018-03-12 ENCOUNTER — Other Ambulatory Visit: Payer: Medicare Other

## 2018-03-13 LAB — BASIC METABOLIC PANEL
BUN/Creatinine Ratio: 18 (ref 12–28)
BUN: 16 mg/dL (ref 8–27)
CALCIUM: 9.5 mg/dL (ref 8.7–10.3)
CO2: 25 mmol/L (ref 20–29)
CREATININE: 0.91 mg/dL (ref 0.57–1.00)
Chloride: 94 mmol/L — ABNORMAL LOW (ref 96–106)
GFR, EST AFRICAN AMERICAN: 66 mL/min/{1.73_m2} (ref >59)
GFR, EST NON AFRICAN AMERICAN: 57 mL/min/{1.73_m2} — AB (ref >59)
Glucose: 127 mg/dL — ABNORMAL HIGH (ref 65–99)
Potassium: 4.1 mmol/L (ref 3.5–5.2)
Sodium: 136 mmol/L (ref 134–144)

## 2018-03-23 ENCOUNTER — Other Ambulatory Visit: Payer: Self-pay

## 2018-03-23 DIAGNOSIS — Z79899 Other long term (current) drug therapy: Secondary | ICD-10-CM

## 2018-03-23 NOTE — Progress Notes (Signed)
Notes recorded by Lendon Colonel, NP on 03/23/2018 at 7:24 AM EDT Repeat in one month., Mg and BMET  Pt informed of providers recommendations. Pt verbalized understanding. No further questions .

## 2018-04-21 ENCOUNTER — Other Ambulatory Visit: Payer: Self-pay

## 2018-04-21 DIAGNOSIS — Z79899 Other long term (current) drug therapy: Secondary | ICD-10-CM

## 2018-04-22 LAB — BASIC METABOLIC PANEL
BUN / CREAT RATIO: 15 (ref 12–28)
BUN: 16 mg/dL (ref 8–27)
CO2: 23 mmol/L (ref 20–29)
CREATININE: 1.07 mg/dL — AB (ref 0.57–1.00)
Calcium: 9.5 mg/dL (ref 8.7–10.3)
Chloride: 97 mmol/L (ref 96–106)
GFR calc Af Amer: 54 mL/min/{1.73_m2} — ABNORMAL LOW (ref >59)
GFR calc non Af Amer: 47 mL/min/{1.73_m2} — ABNORMAL LOW (ref >59)
GLUCOSE: 151 mg/dL — AB (ref 65–99)
POTASSIUM: 4.4 mmol/L (ref 3.5–5.2)
SODIUM: 137 mmol/L (ref 134–144)

## 2018-04-22 LAB — MAGNESIUM: Magnesium: 1.5 mg/dL — ABNORMAL LOW (ref 1.6–2.3)

## 2018-04-23 ENCOUNTER — Telehealth: Payer: Self-pay | Admitting: Adult Health

## 2018-04-23 NOTE — Telephone Encounter (Signed)
New message:      Pt is calling and states she received a call on yesterday on her lab results but was never told what to do.

## 2018-04-23 NOTE — Telephone Encounter (Signed)
Notes recorded by Waylan Rocher, LPN on 25/95/6387 at 10:04 AM EDT Pt informed of Jory Sims, DNP result recommendations. Pt verbalized understanding. No further questions .

## 2018-06-08 ENCOUNTER — Ambulatory Visit: Payer: Medicare Other | Admitting: Internal Medicine

## 2018-06-08 ENCOUNTER — Encounter: Payer: Self-pay | Admitting: Internal Medicine

## 2018-06-08 VITALS — BP 97/63 | HR 51 | Ht 65.0 in | Wt 158.2 lb

## 2018-06-08 DIAGNOSIS — I10 Essential (primary) hypertension: Secondary | ICD-10-CM

## 2018-06-08 DIAGNOSIS — Z9889 Other specified postprocedural states: Secondary | ICD-10-CM | POA: Diagnosis not present

## 2018-06-08 DIAGNOSIS — Z8679 Personal history of other diseases of the circulatory system: Secondary | ICD-10-CM

## 2018-06-08 DIAGNOSIS — R001 Bradycardia, unspecified: Secondary | ICD-10-CM

## 2018-06-08 NOTE — Patient Instructions (Signed)
Medication Instructions:  Continue current medications If you need a refill on your cardiac medications before your next appointment, please call your pharmacy.   Follow-Up: At [facility], you and your health needs are our priority.  As part of our continuing mission to provide you with exceptional heart care, we have created designated Provider Care Teams.  These Care Teams include your primary Cardiologist (physician) and Advanced Practice Providers (APPs -  Physician Assistants and Nurse Practitioners) who all work together to provide you with the care you need, when you need it. You will need a follow up appointment in 12 months.  Please call our office 2 months in advance to schedule this appointment.  You may see one of the following Advanced Practice Providers on your designated Care Team: Almyra Deforest, [place] . Fabian Sharp, PA-C  Any Other Special Instructions Will Be Listed Below (If Applicable).

## 2018-06-08 NOTE — Progress Notes (Signed)
OFFICE NOTE  Chief Complaint:  No specific complaints  Primary Care Physician: Katherina Mires, MD  HPI:  Mary Merritt is an 84 year old female  Kindly referred to me by Dr. Thea Silversmith. She is recently been having hip problems and is in need of hip replacement. Her past medical history significant for hypertension which is been difficult to control, dyslipidemia and  AAA status post repair by Dr. Scot Dock in 2009.  Recently during preoperative evaluation she was found to be bradycardic with a heart rate in the 40s. It is notable that she is on a beta blocker. She's also had difficult to control hypertension. She has not had follow-up with the vascular surgeons for her abdominal aortic aneurysm. She reportedly had stress testing prior to vascular surgery. She is not complaining of any angina but does get some shortness of breath. She is not particularly active due to her hip problem.   Mary Merritt returns today for follow-up. She underwent a nuclear stress test and echocardiogram which showed no significant abnormalities. I've adjusted her medications and reduce her beta blocker, but heart rate still remains in the 60s. She reports a family history of low heart rate. She will need to continue to monitor for any progressive bradycardia.  I saw Mary Merritt back today in the office. Overall she's doing fairly well. She reports some bilateral leg swelling mostly at the ankles. She said this is worse since I increased her Norvasc from 5-10 mg. Pressure still runs a little high. She did undergo successful hip replacement surgery and is walking better. Heart rate is low today at 48 but she is asymptomatic with this. Heart rate is been in the 40s to 50s before. We discussed possibly decreasing her Lopressor however blood pressure remains high.  07/29/2016  Left total hip arthroplasty, previously having undergone right total hip arthroplasty in 2016. She now reports feeling very well with good mobility and no  pain. She denies any significant leg swelling. Recently she's had some elevated heart rates but this was thought to be related to some anxiety. PACs were noted. She is on Lopressor. Heart rate is in the 50s and shows a stable sinus bradycardia with first-degree AV block. She denies any chest pain or shortness of breath.  08/06/2017  Mary Merritt returns today for follow-up.  Overall she seems to be well except she is now complaining of left knee pain.  She apparently underwent hip replacements but now is struggling with knee problems.  She is undergoing injections at [facility].  She denies any chest pain or worsening shortness of breath.  Her blood pressure was markedly elevated today however she reports is better controlled at home.  06/08/2018  Mary Merritt is seen today in follow-up.  She has ongoing knee pain but has had some improvement in her hip pain.  She denies any chest pain or worsening shortness of breath.  Heart rate is in the low 50s today.  Her blood pressure actually is slightly low at 97/63.  She has not been dizzy with this or had any presyncopal symptoms.  PMHx:  Past Medical History:  Diagnosis Date  . Anxiety   . Arthritis   . Dysrhythmia    bradycardia  . History of bronchitis   . Hyperlipidemia   . Hypertension   . Seasonal allergies     Past Surgical History:  Procedure Laterality Date  . ABDOMINAL AORTIC ANEURYSM REPAIR     2009, Dr. Scot Dock  . CARDIAC CATHETERIZATION  patient thinks it was in 09 with dr Scot Dock  . cataracts removed    . INCONTINENCE SURGERY    . TOTAL HIP ARTHROPLASTY Right 08/01/2014   Procedure: RIGHT TOTAL HIP ARTHROPLASTY ANTERIOR APPROACH;  Surgeon: Marianna Payment, MD;  Location: [facility];  Service: Orthopedics;  Laterality: Right;  . TOTAL HIP ARTHROPLASTY Left 03/27/2016   Procedure: LEFT TOTAL HIP ARTHROPLASTY ANTERIOR APPROACH;  Surgeon: Leandrew Koyanagi, MD;  Location: [facility];  Service: Orthopedics;  Laterality: Left;     FAMHx:  Family History  Problem Relation Age of Onset  . Heart failure Father   . Stroke Mother     SOCHx:   reports that she quit smoking about 34 years ago. She has a 30.00 pack-year smoking history. She has never used smokeless tobacco. She reports that she does not drink alcohol or use drugs.  ALLERGIES:  Allergies  Allergen Reactions  . Percocet [Oxycodone-Acetaminophen] Other (See Comments)    Makes head feel funny    ROS: Pertinent items noted in HPI and remainder of comprehensive ROS otherwise negative.  HOME MEDS: Current Outpatient Medications  Medication Sig Dispense Refill  . amLODipine (NORVASC) 10 MG tablet Take 1 tablet (10 mg total) by mouth daily. 90 tablet 1  . aspirin EC 81 MG tablet Take 81 mg by mouth daily.    . cetirizine (ZYRTEC) 10 MG tablet Take 10 mg by mouth at bedtime.     Marland Kitchen escitalopram (LEXAPRO) 10 MG tablet Take 10 mg by mouth daily.    . hydrochlorothiazide (HYDRODIURIL) 25 MG tablet Take 1 tablet (25 mg total) by mouth every other day. 90 tablet 1  . losartan (COZAAR) 50 MG tablet Take 1 tablet (50 mg total) by mouth daily. 90 tablet 1  . lovastatin (MEVACOR) 20 MG tablet Take 1 tablet (20 mg total) by mouth at bedtime. 90 tablet 1  . MAGNESIUM CITRATE PO Take by mouth daily.    . metoprolol tartrate (LOPRESSOR) 25 MG tablet Take 0.5 tablets (12.5 mg total) by mouth 2 (two) times daily. 180 tablet 1  . Omega-3 Fatty Acids (FISH OIL) 1000 MG CAPS Take 2,000 mg by mouth at bedtime.     . TURMERIC PO Take 1 capsule by mouth daily.     No current facility-administered medications for this visit.     LABS/IMAGING: No results found for this or any previous visit (from the past 48 hour(s)). No results found.  VITALS: BP 97/63   Pulse (!) 51   Ht 5\' 5"  (1.651 m)   Wt 158 lb 3.2 oz (71.8 kg)   BMI 26.33 kg/m   EXAM: General appearance: alert and no distress Neck: no carotid bruit, no JVD and thyroid not enlarged, symmetric, no  tenderness/mass/nodules Lungs: clear to auscultation bilaterally Heart: regular rate and rhythm, S1, S2 normal, no murmur, click, rub or gallop Abdomen: soft, non-tender; bowel sounds normal; no masses,  no organomegaly Extremities: edema Trace to 1+ bilateral ankle edema Pulses: 2+ and symmetric Skin: Skin color, texture, turgor normal. No rashes or lesions Neurologic: Grossly normal Psych: Pleasant  EKG: Sinus bradycardia first-degree AV block at 51, low voltage QRS-personally reviewed  ASSESSMENT: 1.  History of AAA status post repair in 2009 2.  Hypertension 3.  dyslipidemia 4.  murmur 5. Leg edema 6. Status post bilateral total hip arthroplasty 7. Asymptomatic bradycardia  PLAN: 1.    Mary Merritt is asymptomatic from a cardiac standpoint.  She remains bradycardic and is slightly hypotensive today  however blood pressures are typically normal according to her daughter.  I will not adjust her medications further today.  She does have an asymptomatic bradycardia.  Follow-up with me annually or sooner as necessary.   Pixie Casino, MD, [facility], [place] Director of the Advanced Lipid Disorders &  Cardiovascular Risk Reduction Clinic Diplomate of the American Board of Clinical Lipidology Attending Cardiologist  Direct Dial: [phone]  Fax: [phone]  Website:  [email or web address]  06/08/2018, 2:24 PM

## 2018-06-09 ENCOUNTER — Encounter: Payer: Self-pay | Admitting: Internal Medicine

## 2018-07-24 ENCOUNTER — Other Ambulatory Visit: Payer: Self-pay | Admitting: Adult Health

## 2018-08-22 ENCOUNTER — Other Ambulatory Visit: Payer: Self-pay | Admitting: Adult Health

## 2018-09-10 ENCOUNTER — Other Ambulatory Visit: Payer: Self-pay | Admitting: Adult Health

## 2018-12-23 ENCOUNTER — Emergency Department (HOSPITAL_COMMUNITY): Payer: Medicare Other

## 2018-12-23 ENCOUNTER — Encounter (HOSPITAL_COMMUNITY): Payer: Self-pay

## 2018-12-23 ENCOUNTER — Other Ambulatory Visit: Payer: Self-pay

## 2018-12-23 ENCOUNTER — Emergency Department (HOSPITAL_COMMUNITY)
Admission: EM | Admit: 2018-12-23 | Discharge: 2018-12-23 | Disposition: A | Discharge status: Home / Self Care | Payer: Medicare Other | Attending: Emergency Medicine | Admitting: Emergency Medicine

## 2018-12-23 DIAGNOSIS — I10 Essential (primary) hypertension: Secondary | ICD-10-CM | POA: Diagnosis not present

## 2018-12-23 DIAGNOSIS — Z87891 Personal history of nicotine dependence: Secondary | ICD-10-CM | POA: Diagnosis not present

## 2018-12-23 DIAGNOSIS — Z79899 Other long term (current) drug therapy: Secondary | ICD-10-CM | POA: Diagnosis not present

## 2018-12-23 DIAGNOSIS — R000 Tachycardia, unspecified: Secondary | ICD-10-CM

## 2018-12-23 DIAGNOSIS — N3000 Acute cystitis without hematuria: Secondary | ICD-10-CM

## 2018-12-23 DIAGNOSIS — Z96643 Presence of artificial hip joint, bilateral: Secondary | ICD-10-CM | POA: Diagnosis not present

## 2018-12-23 DIAGNOSIS — R42 Dizziness and giddiness: Secondary | ICD-10-CM | POA: Diagnosis present

## 2018-12-23 DIAGNOSIS — Z7982 Long term (current) use of aspirin: Secondary | ICD-10-CM | POA: Diagnosis not present

## 2018-12-23 DIAGNOSIS — E871 Hypo-osmolality and hyponatremia: Secondary | ICD-10-CM

## 2018-12-23 DIAGNOSIS — N3090 Cystitis, unspecified without hematuria: Secondary | ICD-10-CM | POA: Diagnosis not present

## 2018-12-23 LAB — CBC
HCT: 45 % (ref 36.0–46.0)
Hemoglobin: 15.4 g/dL — ABNORMAL HIGH (ref 12.0–15.0)
MCH: 30.7 pg (ref 26.0–34.0)
MCHC: 34.2 g/dL (ref 30.0–36.0)
MCV: 89.8 fL (ref 80.0–100.0)
Platelets: 315 10*3/uL (ref 150–400)
RBC: 5.01 MIL/uL (ref 3.87–5.11)
RDW: 13.7 % (ref 11.5–15.5)
WBC: 11.8 10*3/uL — ABNORMAL HIGH (ref 4.0–10.5)
nRBC: 0 % (ref 0.0–0.2)

## 2018-12-23 LAB — TROPONIN I (HIGH SENSITIVITY)
Troponin I (High Sensitivity): 9 ng/L (ref <18)
Troponin I (High Sensitivity): 9 ng/L (ref <18)

## 2018-12-23 LAB — URINALYSIS, ROUTINE W REFLEX MICROSCOPIC
Bilirubin Urine: NEGATIVE
Glucose, UA: NEGATIVE mg/dL
Ketones, ur: NEGATIVE mg/dL
Nitrite: POSITIVE — AB
Protein, ur: NEGATIVE mg/dL
Specific Gravity, Urine: 1.013 (ref 1.005–1.030)
pH: 6 (ref 5.0–8.0)

## 2018-12-23 LAB — BASIC METABOLIC PANEL
Anion gap: 13 (ref 5–15)
BUN: 19 mg/dL (ref 8–23)
CO2: 23 mmol/L (ref 22–32)
Calcium: 9.3 mg/dL (ref 8.9–10.3)
Chloride: 93 mmol/L — ABNORMAL LOW (ref 98–111)
Creatinine, Ser: 1.23 mg/dL — ABNORMAL HIGH (ref 0.44–1.00)
GFR calc Af Amer: 45 mL/min — ABNORMAL LOW (ref >60)
GFR calc non Af Amer: 39 mL/min — ABNORMAL LOW (ref >60)
Glucose, Bld: 120 mg/dL — ABNORMAL HIGH (ref 70–99)
Potassium: 4.2 mmol/L (ref 3.5–5.1)
Sodium: 129 mmol/L — ABNORMAL LOW (ref 135–145)

## 2018-12-23 MED ORDER — METOPROLOL TARTRATE 5 MG/5ML IV SOLN
5.0000 mg | Freq: Once | INTRAVENOUS | Status: AC
  Administered 2018-12-23: 5 mg via INTRAVENOUS
  Filled 2018-12-23: qty 5

## 2018-12-23 MED ORDER — SODIUM CHLORIDE 0.9% FLUSH: 3.0000 mL | Freq: Once | INTRAVENOUS | Status: DC

## 2018-12-23 MED ORDER — CEPHALEXIN 500 MG PO CAPS: 500.0000 mg | ORAL_CAPSULE | Freq: Four times a day (QID) | ORAL | 0 refills | Status: DC

## 2018-12-23 MED ORDER — CEPHALEXIN 250 MG PO CAPS
500.0000 mg | ORAL_CAPSULE | Freq: Once | ORAL | Status: AC
  Administered 2018-12-23: 500 mg via ORAL
  Filled 2018-12-23: qty 2

## 2018-12-23 NOTE — ED Provider Notes (Signed)
[facility] EMERGENCY DEPARTMENT Provider Note   CSN: [id number] Arrival date & time: 12/23/18  1758    History   Chief Complaint No chief complaint on file.   HPI Mary Merritt is a 85 y.o. female.     Pt presents to the ED today with fast HR.  Pt said she became dizzy today when she stood up.  The pt checked her bp and noticed her HR was in the 130s.  She called her doctor who told her to come in to the ED.  The pt said she feels fine.  The pt said she has healing shingles to her right arm.  The pt denies any cp or sob.  No hx of afib.     Past Medical History:  Diagnosis Date   Anxiety    Arthritis    Dysrhythmia    bradycardia   History of bronchitis    Hyperlipidemia    Hypertension    Seasonal allergies     Patient Active Problem List   Diagnosis Date Noted   Hyponatremia 11/22/2017   Nondisplaced fracture of left tibial spine, subsequent encounter for closed fracture with routine healing 08/05/2017   Chronic pain of left knee 07/21/2017   Presence of left artificial hip joint 05/30/2016   Hip joint replacement status 03/27/2016   S/P hip replacement 07/27/2015   Osteoarthritis of right hip 08/01/2014   Essential hypertension 05/31/2014   Mixed hyperlipidemia 05/31/2014   S/P AAA repair 05/31/2014   Bradycardia 05/31/2014   Murmur 05/31/2014    Past Surgical History:  Procedure Laterality Date   ABDOMINAL AORTIC ANEURYSM REPAIR     2009, Dr. Scot Dock   CARDIAC CATHETERIZATION     patient thinks it was in 09 with dr Scot Dock   cataracts removed     INCONTINENCE SURGERY     TOTAL HIP ARTHROPLASTY Right 08/01/2014   Procedure: RIGHT TOTAL HIP ARTHROPLASTY ANTERIOR APPROACH;  Surgeon: Marianna Payment, MD;  Location: [facility];  Service: Orthopedics;  Laterality: Right;   TOTAL HIP ARTHROPLASTY Left 03/27/2016   Procedure: LEFT TOTAL HIP ARTHROPLASTY ANTERIOR APPROACH;  Surgeon: Leandrew Koyanagi, MD;  Location: [place];   Service: Orthopedics;  Laterality: Left;     OB History   No obstetric history on file.      Home Medications    Prior to Admission medications   Medication Sig Start Date End Date Taking? Authorizing Provider  amLODipine (NORVASC) 10 MG tablet Take 1 tablet by mouth once daily 09/10/18   Lendon Colonel, NP  aspirin EC 81 MG tablet Take 81 mg by mouth daily.    [provider]  cephALEXin (KEFLEX) 500 MG capsule Take 1 capsule (500 mg total) by mouth 4 (four) times daily. 12/23/18   Isla Pence, MD  cetirizine (ZYRTEC) 10 MG tablet Take 10 mg by mouth at bedtime.     [provider]  escitalopram (LEXAPRO) 10 MG tablet Take 10 mg by mouth daily.    [provider]  hydrochlorothiazide (HYDRODIURIL) 25 MG tablet Take 1 tablet (25 mg total) by mouth every other day. 01/21/18 06/08/18  Lendon Colonel, NP  losartan (COZAAR) 50 MG tablet TAKE 1 TABLET BY MOUTH ONCE DAILY 07/24/18   Pixie Casino, MD  lovastatin (MEVACOR) 20 MG tablet TAKE 1 TABLET BY MOUTH AT BEDTIME 08/25/18   Hilty, Nadean Corwin, MD  MAGNESIUM CITRATE PO Take by mouth daily.    [provider]  metoprolol  tartrate (LOPRESSOR) 25 MG tablet Take 0.5 tablets (12.5 mg total) by mouth 2 (two) times daily. 01/21/18 06/08/18  Lendon Colonel, NP  Omega-3 Fatty Acids (FISH OIL) 1000 MG CAPS Take 2,000 mg by mouth at bedtime.     [provider]  TURMERIC PO Take 1 capsule by mouth daily.    [provider]    Family History Family History  Problem Relation Age of Onset   Heart failure Father    Stroke Mother     Social History Social History   Tobacco Use   Smoking status: Former Smoker    Packs/day: 1.00    Years: 30.00    Pack years: 30.00    Quit date: 05/31/1984    Years since quitting: 34.5   Smokeless tobacco: Never Used  Substance Use Topics   Alcohol use: No    Alcohol/week: 0.0 standard drinks   Drug use: No     Allergies     Percocet [oxycodone-acetaminophen]   Review of Systems Review of Systems  Cardiovascular: Positive for palpitations.  All other systems reviewed and are negative.    Physical Exam Updated Vital Signs BP 125/68    Pulse 60    Temp 98.1 F (36.7 C) (Oral)    Resp (!) 22    SpO2 95%   Physical Exam Vitals signs and nursing note reviewed.  Constitutional:      Appearance: Normal appearance.  HENT:     Head: Normocephalic and atraumatic.     Right Ear: External ear normal.     Left Ear: External ear normal.     Nose: Nose normal.     Mouth/Throat:     Mouth: Mucous membranes are moist.     Pharynx: Oropharynx is clear.  Eyes:     Extraocular Movements: Extraocular movements intact.     Conjunctiva/sclera: Conjunctivae normal.     Pupils: Pupils are equal, round, and reactive to light.  Neck:     Musculoskeletal: Normal range of motion and neck supple.  Cardiovascular:     Rate and Rhythm: Regular rhythm. Tachycardia present.     Pulses: Normal pulses.     Heart sounds: Normal heart sounds.  Pulmonary:     Effort: Pulmonary effort is normal.     Breath sounds: Normal breath sounds.  Abdominal:     General: Abdomen is flat. Bowel sounds are normal.     Palpations: Abdomen is soft.  Musculoskeletal: Normal range of motion.  Skin:    General: Skin is warm.     Capillary Refill: Capillary refill takes less than 2 seconds.  Neurological:     General: No focal deficit present.     Mental Status: She is alert and oriented to person, place, and time.  Psychiatric:        Mood and Affect: Mood normal.        Behavior: Behavior normal.        Thought Content: Thought content normal.        Judgment: Judgment normal.      ED Treatments / Results  Labs (all labs ordered are listed, but only abnormal results are displayed) Labs Reviewed  BASIC METABOLIC PANEL - Abnormal; Notable for the following components:      Result Value   Sodium 129 (*)    Chloride 93 (*)     Glucose, Bld 120 (*)    Creatinine, Ser 1.23 (*)    GFR calc non Af Amer 39 (*)  GFR calc Af Amer 45 (*)    All other components within normal limits  CBC - Abnormal; Notable for the following components:   WBC 11.8 (*)    Hemoglobin 15.4 (*)    All other components within normal limits  URINALYSIS, ROUTINE W REFLEX MICROSCOPIC - Abnormal; Notable for the following components:   APPearance HAZY (*)    Hgb urine dipstick SMALL (*)    Nitrite POSITIVE (*)    Leukocytes,Ua MODERATE (*)    Bacteria, UA MANY (*)    All other components within normal limits  TROPONIN I (HIGH SENSITIVITY)  TROPONIN I (HIGH SENSITIVITY)    EKG EKG Interpretation  Date/Time:  Wednesday December 23 2018 20:57:33 EDT Ventricular Rate:  64 PR Interval:  124 QRS Duration: 104 QT Interval:  421 QTC Calculation: 435 R Axis:   -13 Text Interpretation:  Sinus rhythm Borderline prolonged PR interval Low voltage, precordial leads after 5 mg lopressor IV Confirmed by Isla Pence [phone]) on 12/23/2018 9:07:34 PM   Radiology Dg Chest 2 View  Result Date: 12/23/2018 CLINICAL DATA:  Initial evaluation for rapid heart rate for 1 day. History of hypertension. EXAM: CHEST - 2 VIEW COMPARISON:  Prior radiograph from 11/22/2017. FINDINGS: Transverse heart size stable, and within normal limits. Mediastinal silhouette normal. Aortic atherosclerosis. Lungs normally inflated. Patchy opacity at the posterior left lung base could reflect atelectasis or possibly small infiltrate, best seen on lateral projection. No other focal airspace disease. No edema or discernible pleural effusion. No pneumothorax. No acute osseous abnormality. Remotely healed left-sided rib fractures noted. IMPRESSION: 1. Patchy opacity at the posterior left lung base. Atelectasis is favored, although a small infiltrate could be considered in the correct clinical setting. 2. Aortic atherosclerosis. Electronically Signed   By: Jeannine Boga M.D.   On:  12/23/2018 19:46    Procedures Procedures (including critical care time)  Medications Ordered in ED Medications  sodium chloride flush (NS) 0.9 % injection 3 mL (has no administration in time range)  cephALEXin (KEFLEX) capsule 500 mg (has no administration in time range)  metoprolol tartrate (LOPRESSOR) injection 5 mg (5 mg Intravenous Given 12/23/18 2013)     Initial Impression / Assessment and Plan / ED Course  I have reviewed the triage vital signs and the nursing notes.  Pertinent labs & imaging results that were available during my care of the patient were reviewed by me and considered in my medical decision making (see chart for details).     Pt given 5 mg of lopressor IV for ST rate in the 130s.  Repeat EKG showed HR to be in the 60s with NSR.  The pt does have a uti and will be started on keflex.  She has hyponatremia, but takes hctz.  She is told to stop that and get her sodium rechecked in 1 week.  The pt has a negative troponin and feels well, so I don't think she needs to stay.  She is encouraged to f/u with her cardiologist.  Return if worse.  Final Clinical Impressions(s) / ED Diagnoses   Final diagnoses:  Acute cystitis without hematuria  Hyponatremia  Sinus tachycardia    ED Discharge Orders         Ordered    cephALEXin (KEFLEX) 500 MG capsule  4 times daily     12/23/18 2210           Isla Pence, MD 12/23/18 2212

## 2018-12-23 NOTE — ED Triage Notes (Signed)
Onset earlier today pt got dizzy when standing, checked BP and noticed HR rate.  Mild dizziness at this time.  No chest pain, nausea, short of breath.   Recent shingles to right arm.  Onset 1 week right arm pain.

## 2018-12-23 NOTE — ED Notes (Signed)
Discharge instructions discussed with pt. Pt verbalized understanding no questions at this time. Pt to follow up with pcp next week

## 2018-12-23 NOTE — Discharge Instructions (Addendum)
Stop HCTZ for now.  You will need a repeat sodium level by your doctor in about 1 week.

## 2018-12-23 NOTE — ED Notes (Signed)
Patient transported to X-ray 

## 2018-12-24 ENCOUNTER — Telehealth: Payer: Self-pay | Admitting: Internal Medicine

## 2018-12-24 ENCOUNTER — Telehealth: Payer: Self-pay | Admitting: Adult Health

## 2018-12-24 NOTE — Telephone Encounter (Signed)
Returned call to pt she states that her heart was racing yesterday and she went to the ER they gave her medication IV and it went back to normal after 30-40 minutes  She states that she has shingles and thinks that this may be contributing to this. Her BP this morning was 137/60 HR 66. She just  Took HR again it is 58. Pt will take it easy and stay cool over the weekend and not exert herself until appointment. Verbalizes understanding.

## 2018-12-24 NOTE — Telephone Encounter (Signed)
STAT if HR is under 50 or over 120 (normal HR is 60-100 beats per minute)  1) What is your heart rate? 58  2) Do you have a log of your heart rate readings (document readings)? yesterday130 patient went to [facility]  3) Do you have any other symptoms? No  Patient called to schedule a f/u visit. She has a HR that normally stays in the 50s, but yesterday it went up to 130. She went to the ED, and the  Hospital was able to bring it back down.  Patient states she has shingles on her right arm, and she thinks that may have caused the change in her heart rate.  She is worried about what to do if her HR becomes elevated again over the weekend

## 2018-12-24 NOTE — Telephone Encounter (Signed)

## 2018-12-26 NOTE — Progress Notes (Signed)
Cardiology Office Note   Date:  12/26/2018   ID:  Mary Merritt, DOB Jun 09, 1928, MRN [id number]  PCP:  Katherina Mires, MD  Cardiologist: Dr. Debara Pickett Follow-up for palpitations   History of Present Illness: Mary Merritt is a 85 y.o. female who presents for ongoing assessment and management of hypertension, with history of AAA status post repair in 2009 bradycardia, chronic lower extremity edema, hyperlipidemia anxiety, bilateral total hip arthroplasty.  She was last seen by Dr. Debara Pickett on 06/08/2018 and was asymptomatic.  She was found to be bradycardic and slightly hypotensive however blood pressures are typically low normal for her.  No medication changes were made and no testing was planned.Marland Kitchen  She was seen in the emergency room on 12/23/2018 with rapid heart rhythm and dizziness.  She called our office and was directed to the ED as she reported that her heart rate was 130 bpm.  EKG confirmed elevated heart rate and she was treated with 5 mg of Lopressor IV.  Repeat EKG revealed sinus bradycardia heart rate in the 60s.  UA was positive for UTI and she was started on Keflex.  She was noted to have mild hyponatremia on HCTZ.  This was discontinued.  Today she states she she is feeling better.  She has been taking care of flowers around her house and is back to doing all of her activities of daily living inside.  She states that she worries from time to time about her health and other things which she thinks contributes to her blood pressure increasing and heart rate increasing.  She states that on 12/23/2018 she was able to feel  that her heart rate was increased.  She has not noticed an increase in heart rate since leaving the hospital.  She has also been taking the antibiotic that was prescribed at the hospital for her UTI and has not had any adverse reaction/symptoms.  She denies chest pain, shortness of breath, palpitations, increased lower extremity edema, syncope, presyncope, headaches, fatigue, weakness,  melena, hematuria, hemoptysis, orthopnea, and PND.   Past Medical History:  Diagnosis Date  . Anxiety   . Arthritis   . Dysrhythmia    bradycardia  . History of bronchitis   . Hyperlipidemia   . Hypertension   . Seasonal allergies     Past Surgical History:  Procedure Laterality Date  . ABDOMINAL AORTIC ANEURYSM REPAIR     2009, Dr. Scot Dock  . CARDIAC CATHETERIZATION     patient thinks it was in 09 with dr Scot Dock  . cataracts removed    . INCONTINENCE SURGERY    . TOTAL HIP ARTHROPLASTY Right 08/01/2014   Procedure: RIGHT TOTAL HIP ARTHROPLASTY ANTERIOR APPROACH;  Surgeon: Marianna Payment, MD;  Location: [place];  Service: Orthopedics;  Laterality: Right;  . TOTAL HIP ARTHROPLASTY Left 03/27/2016   Procedure: LEFT TOTAL HIP ARTHROPLASTY ANTERIOR APPROACH;  Surgeon: Leandrew Koyanagi, MD;  Location: [facility];  Service: Orthopedics;  Laterality: Left;     Current Outpatient Medications  Medication Sig Dispense Refill  . amLODipine (NORVASC) 10 MG tablet Take 1 tablet by mouth once daily 90 tablet 1  . aspirin EC 81 MG tablet Take 81 mg by mouth daily.    . cephALEXin (KEFLEX) 500 MG capsule Take 1 capsule (500 mg total) by mouth 4 (four) times daily. 28 capsule 0  . cetirizine (ZYRTEC) 10 MG tablet Take 10 mg by mouth at bedtime.     Marland Kitchen escitalopram (LEXAPRO) 10 MG tablet  Take 10 mg by mouth daily.    . hydrochlorothiazide (HYDRODIURIL) 25 MG tablet Take 1 tablet (25 mg total) by mouth every other day. 90 tablet 1  . losartan (COZAAR) 50 MG tablet TAKE 1 TABLET BY MOUTH ONCE DAILY 90 tablet 3  . lovastatin (MEVACOR) 20 MG tablet TAKE 1 TABLET BY MOUTH AT BEDTIME 90 tablet 3  . MAGNESIUM CITRATE PO Take by mouth daily.    . metoprolol tartrate (LOPRESSOR) 25 MG tablet Take 0.5 tablets (12.5 mg total) by mouth 2 (two) times daily. 180 tablet 1  . Omega-3 Fatty Acids (FISH OIL) 1000 MG CAPS Take 2,000 mg by mouth at bedtime.     . TURMERIC PO Take 1 capsule by mouth daily.     No  current facility-administered medications for this visit.     Allergies:   Percocet [oxycodone-acetaminophen]    Social History:  The patient  reports that she quit smoking about 34 years ago. She has a 30.00 pack-year smoking history. She has never used smokeless tobacco. She reports that she does not drink alcohol or use drugs.   Family History:  The patient's family history includes Heart failure in her father; Stroke in her mother.    ROS: All other systems are reviewed and negative. Unless otherwise mentioned in H&P    PHYSICAL EXAM: VS:  There were no vitals taken for this visit. , BMI There is no height or weight on file to calculate BMI. GEN: Well nourished, well developed, in no acute distress HEENT: normal Neck: no JVD, carotid bruits, or masses Cardiac: Sinus bradycardia with first-degree AV block, RRR; no murmurs, rubs, or gallops,+++ left ankle nonpitting edema  Respiratory:  Clear to auscultation bilaterally, normal work of breathing GI: soft, nontender, nondistended, + BS MS: no deformity or atrophy Skin: warm and dry, no rash Neuro:  Strength and sensation are intact Psych: euthymic mood, full affect   EKG: Reviewed EKG from 12/24/2018 revealing sinus tachycardia with nonspecific lateral T wave abnormalities heart rate 129 bpm  EKG: 12/28/2018   Recent Labs: 04/21/2018: Magnesium 1.5 12/23/2018: BUN 19; Creatinine, Ser 1.23; Hemoglobin 15.4; Platelets 315; Potassium 4.2; Sodium 129    Lipid Panel No results found for: CHOL, TRIG, HDL, CHOLHDL, VLDL, LDLCALC, LDLDIRECT    Wt Readings from Last 3 Encounters:  06/08/18 158 lb 3.2 oz (71.8 kg)  01/21/18 159 lb (72.1 kg)  01/21/18 158 lb 9.6 oz (71.9 kg)      Other studies Reviewed: NM Study 06/21/2014.  Impression Exercise Capacity:  Lexiscan with no exercise. BP Response:  Normal blood pressure response. Clinical Symptoms:  No significant symptoms noted. ECG Impression:  No significant ST segment change  suggestive of ischemia. Comparison with Prior Nuclear Study: No images to compare  Overall Impression:  Normal stress nuclear study.   ASSESSMENT AND PLAN:  1.  Palpitations- EKG sinus bradycardia with first-degree AV block 54 bpm. Continue metoprolol tartrate 12.5  mg twice daily  2.  Hyponatremia- NA 12/23/2018 129    Ordered BMP and will send to Dr. Suzanna Obey for her PCP appointment tomorrow.  3.Essential hypertension- 136/68 (12/28/2018) Continue amlodipine 10 mg tablet daily Continue losartan 50 mg tablet daily Continue metoprolol tartrate 12.5 mg tablet twice daily Encouraged to keep blood pressure log daily and bring to doctor's visits. Encouraged to increase physical activity as tolerated  Disposition: Follow-up Dr. Debara Pickett in November, keep previously scheduled appointment.    Current medicines are reviewed at length with the patient today.  Labs/ tests ordered today include: BMP  Jossie Ng. Danna Sewell NP-C    12/26/2018 5:37 PM    [facility] [place] [address] Office [phone] Fax [phone]

## 2018-12-28 ENCOUNTER — Ambulatory Visit (INDEPENDENT_AMBULATORY_CARE_PROVIDER_SITE_OTHER): Payer: Medicare Other | Admitting: Adult Health

## 2018-12-28 ENCOUNTER — Encounter: Payer: Self-pay | Admitting: Adult Health

## 2018-12-28 ENCOUNTER — Other Ambulatory Visit: Payer: Self-pay

## 2018-12-28 VITALS — BP 136/68 | HR 54 | Ht 65.0 in | Wt 159.8 lb

## 2018-12-28 DIAGNOSIS — Z79899 Other long term (current) drug therapy: Secondary | ICD-10-CM

## 2018-12-28 DIAGNOSIS — E871 Hypo-osmolality and hyponatremia: Secondary | ICD-10-CM

## 2018-12-28 DIAGNOSIS — I10 Essential (primary) hypertension: Secondary | ICD-10-CM | POA: Diagnosis not present

## 2018-12-28 DIAGNOSIS — R002 Palpitations: Secondary | ICD-10-CM

## 2018-12-28 NOTE — Patient Instructions (Signed)
Medication Instructions:  Continue current medications  If you need a refill on your cardiac medications before your next appointment, please call your pharmacy.  Labwork: BMP today  Testing/Procedures: None Ordered  Follow-Up: You will need a follow up appointment in 6 months.  Please call our office 2 months in advance to schedule this appointment.  You may see Pixie Casino, MD or one of the following Advanced Practice Providers on your designated Care Team: [place], [place] . Fabian Sharp, PA-C     At [facility], you and your health needs are our priority.  As part of our continuing mission to provide you with exceptional heart care, we have created designated Provider Care Teams.  These Care Teams include your primary Cardiologist (physician) and Advanced Practice Providers (APPs -  Physician Assistants and Nurse Practitioners) who all work together to provide you with the care you need, when you need it.  Thank you for choosing [facility] at [facility]!!

## 2018-12-29 LAB — BASIC METABOLIC PANEL
BUN/Creatinine Ratio: 17 (ref 12–28)
BUN: 18 mg/dL (ref 8–27)
CO2: 23 mmol/L (ref 20–29)
Calcium: 9.5 mg/dL (ref 8.7–10.3)
Chloride: 95 mmol/L — ABNORMAL LOW (ref 96–106)
Creatinine, Ser: 1.08 mg/dL — ABNORMAL HIGH (ref 0.57–1.00)
GFR calc Af Amer: 53 mL/min/{1.73_m2} — ABNORMAL LOW (ref >59)
GFR calc non Af Amer: 46 mL/min/{1.73_m2} — ABNORMAL LOW (ref >59)
Glucose: 96 mg/dL (ref 65–99)
Potassium: 5.1 mmol/L (ref 3.5–5.2)
Sodium: 132 mmol/L — ABNORMAL LOW (ref 134–144)

## 2018-12-30 NOTE — Addendum Note (Signed)
Addended by: Diana Eves on: 12/30/2018 06:25 PM   Modules accepted: Orders

## 2019-03-11 ENCOUNTER — Other Ambulatory Visit: Payer: Self-pay | Admitting: Adult Health

## 2019-03-25 ENCOUNTER — Other Ambulatory Visit: Payer: Self-pay | Admitting: Adult Health

## 2019-04-05 ENCOUNTER — Ambulatory Visit (INDEPENDENT_AMBULATORY_CARE_PROVIDER_SITE_OTHER): Payer: Medicare Other | Admitting: Orthopaedic Surgery

## 2019-04-06 ENCOUNTER — Other Ambulatory Visit: Payer: Self-pay

## 2019-04-06 ENCOUNTER — Ambulatory Visit (INDEPENDENT_AMBULATORY_CARE_PROVIDER_SITE_OTHER): Payer: Medicare Other | Admitting: Orthopaedic Surgery

## 2019-04-06 ENCOUNTER — Encounter: Payer: Self-pay | Admitting: Orthopaedic Surgery

## 2019-04-06 ENCOUNTER — Ambulatory Visit: Payer: Self-pay

## 2019-04-06 VITALS — Ht 64.0 in | Wt 161.0 lb

## 2019-04-06 DIAGNOSIS — Z96643 Presence of artificial hip joint, bilateral: Secondary | ICD-10-CM | POA: Diagnosis not present

## 2019-04-06 NOTE — Progress Notes (Signed)
Post-Op Visit Note   Patient: Mary Merritt           Date of Birth: 1928/02/13           MRN: [id number] Visit Date: 04/06/2019 PCP: Katherina Mires, MD   Assessment & Plan:  Chief Complaint:  Chief Complaint  Patient presents with  . Left Hip - Follow-up   Visit Diagnoses:  1. S/P bilateral hip replacements     Plan: Patient is a pleasant 85 year old female who presents our clinic today for follow-up of bilateral total hip replacements, right, 08/01/2014 and left 03/27/2016.  She has been doing very well.  She notes that she still gets some fatigue with the right hip but has improved over the years with exercise.  No pain.  Examination of both hips reveals painless logroll's.  She has full hip flexion.  She is neurovascular intact distally.  At this point, she will continue to advance with activity and continue her exercise regimen.  She will follow-up with [place] in 2 years time for repeat evaluation AP pelvis lateral hip x-rays of both hips.  Follow-Up Instructions: Return in about 2 years (around 04/05/2021).   Orders:  Orders Placed This Encounter  Procedures  . XR HIP UNILAT W OR W/O PELVIS 2-3 VIEWS LEFT   No orders of the defined types were placed in this encounter.   Imaging: Xr Hip Unilat W Or W/o Pelvis 2-3 Views Left  Result Date: 04/06/2019 X-rays demonstrate well-seated prostheses without complication   PMFS History: Patient Active Problem List   Diagnosis Date Noted  . Hyponatremia 11/22/2017  . Nondisplaced fracture of left tibial spine, subsequent encounter for closed fracture with routine healing 08/05/2017  . Chronic pain of left knee 07/21/2017  . Presence of left artificial hip joint 05/30/2016  . Hip joint replacement status 03/27/2016  . S/P hip replacement 07/27/2015  . Osteoarthritis of right hip 08/01/2014  . Essential hypertension 05/31/2014  . Mixed hyperlipidemia 05/31/2014  . S/P AAA repair 05/31/2014  . Bradycardia 05/31/2014  . Murmur  05/31/2014   Past Medical History:  Diagnosis Date  . Anxiety   . Arthritis   . Dysrhythmia    bradycardia  . History of bronchitis   . Hyperlipidemia   . Hypertension   . Seasonal allergies     Family History  Problem Relation Age of Onset  . Heart failure Father   . Stroke Mother     Past Surgical History:  Procedure Laterality Date  . ABDOMINAL AORTIC ANEURYSM REPAIR     2009, Dr. Scot Dock  . CARDIAC CATHETERIZATION     patient thinks it was in 09 with dr Scot Dock  . cataracts removed    . INCONTINENCE SURGERY    . TOTAL HIP ARTHROPLASTY Right 08/01/2014   Procedure: RIGHT TOTAL HIP ARTHROPLASTY ANTERIOR APPROACH;  Surgeon: Marianna Payment, MD;  Location: [place];  Service: Orthopedics;  Laterality: Right;  . TOTAL HIP ARTHROPLASTY Left 03/27/2016   Procedure: LEFT TOTAL HIP ARTHROPLASTY ANTERIOR APPROACH;  Surgeon: Leandrew Koyanagi, MD;  Location: [place];  Service: Orthopedics;  Laterality: Left;   Social History   Occupational History  . Not on file  Tobacco Use  . Smoking status: Former Smoker    Packs/day: 1.00    Years: 30.00    Pack years: 30.00    Quit date: 05/31/1984    Years since quitting: 34.8  . Smokeless tobacco: Never Used  Substance and Sexual Activity  . Alcohol  use: No    Alcohol/week: 0.0 standard drinks  . Drug use: No  . Sexual activity: Not on file

## 2019-04-13 ENCOUNTER — Ambulatory Visit (INDEPENDENT_AMBULATORY_CARE_PROVIDER_SITE_OTHER): Payer: Medicare Other | Admitting: Orthopaedic Surgery

## 2019-07-20 ENCOUNTER — Other Ambulatory Visit: Payer: Self-pay

## 2019-07-20 ENCOUNTER — Ambulatory Visit: Payer: Medicare Other | Admitting: Internal Medicine

## 2019-07-20 ENCOUNTER — Encounter: Payer: Self-pay | Admitting: Internal Medicine

## 2019-07-20 VITALS — BP 158/72 | HR 51 | Temp 96.3°F | Ht 64.0 in | Wt 161.2 lb

## 2019-07-20 DIAGNOSIS — F418 Other specified anxiety disorders: Secondary | ICD-10-CM | POA: Diagnosis not present

## 2019-07-20 DIAGNOSIS — I10 Essential (primary) hypertension: Secondary | ICD-10-CM | POA: Diagnosis not present

## 2019-07-20 DIAGNOSIS — Z9889 Other specified postprocedural states: Secondary | ICD-10-CM

## 2019-07-20 DIAGNOSIS — E782 Mixed hyperlipidemia: Secondary | ICD-10-CM

## 2019-07-20 DIAGNOSIS — Z8679 Personal history of other diseases of the circulatory system: Secondary | ICD-10-CM

## 2019-07-20 DIAGNOSIS — R001 Bradycardia, unspecified: Secondary | ICD-10-CM

## 2019-07-20 NOTE — Patient Instructions (Signed)
Medication Instructions:  Your physician recommends that you continue on your current medications as directed. Please refer to the Current Medication list given to you today.  *If you need a refill on your cardiac medications before your next appointment, please call your pharmacy*  Follow-Up: At [facility], you and your health needs are our priority.  As part of our continuing mission to provide you with exceptional heart care, we have created designated Provider Care Teams.  These Care Teams include your primary Cardiologist (physician) and Advanced Practice Providers (APPs -  Physician Assistants and Nurse Practitioners) who all work together to provide you with the care you need, when you need it.  Your next appointment:   12 month(s)  The format for your next appointment:   In Person  Provider:   You may see Pixie Casino, MD or one of the following Advanced Practice Providers on your designated Care Team:    Almyra Deforest, PA-C  Fabian Sharp, PA-C or   Roby Lofts, [place]   Other Instructions

## 2019-07-20 NOTE — Progress Notes (Signed)
OFFICE NOTE  Chief Complaint:  No complaints  Primary Care Physician: Katherina Mires, MD  HPI:  Mary Merritt is an 86 year old female  Kindly referred to me by Dr. Thea Silversmith. She is recently been having hip problems and is in need of hip replacement. Her past medical history significant for hypertension which is been difficult to control, dyslipidemia and  AAA status post repair by Dr. Scot Dock in 2009.  Recently during preoperative evaluation she was found to be bradycardic with a heart rate in the 40s. It is notable that she is on a beta blocker. She's also had difficult to control hypertension. She has not had follow-up with the vascular surgeons for her abdominal aortic aneurysm. She reportedly had stress testing prior to vascular surgery. She is not complaining of any angina but does get some shortness of breath. She is not particularly active due to her hip problem.   Mary Merritt returns today for follow-up. She underwent a nuclear stress test and echocardiogram which showed no significant abnormalities. I've adjusted her medications and reduce her beta blocker, but heart rate still remains in the 60s. She reports a family history of low heart rate. She will need to continue to monitor for any progressive bradycardia.  I saw Mary Merritt back today in the office. Overall she's doing fairly well. She reports some bilateral leg swelling mostly at the ankles. She said this is worse since I increased her Norvasc from 5-10 mg. Pressure still runs a little high. She did undergo successful hip replacement surgery and is walking better. Heart rate is low today at 48 but she is asymptomatic with this. Heart rate is been in the 40s to 50s before. We discussed possibly decreasing her Lopressor however blood pressure remains high.  07/29/2016  Left total hip arthroplasty, previously having undergone right total hip arthroplasty in 2016. She now reports feeling very well with good mobility and no pain. She  denies any significant leg swelling. Recently she's had some elevated heart rates but this was thought to be related to some anxiety. PACs were noted. She is on Lopressor. Heart rate is in the 50s and shows a stable sinus bradycardia with first-degree AV block. She denies any chest pain or shortness of breath.  08/06/2017  Mary Merritt returns today for follow-up.  Overall she seems to be well except she is now complaining of left knee pain.  She apparently underwent hip replacements but now is struggling with knee problems.  She is undergoing injections at [facility].  She denies any chest pain or worsening shortness of breath.  Her blood pressure was markedly elevated today however she reports is better controlled at home.  06/08/2018  Mary Merritt is seen today in follow-up.  She has ongoing knee pain but has had some improvement in her hip pain.  She denies any chest pain or worsening shortness of breath.  Heart rate is in the low 50s today.  Her blood pressure actually is slightly low at 97/63.  She has not been dizzy with this or had any presyncopal symptoms.  07/20/2019  Mary Merritt returns today for follow-up.  She was seen in the ER over the summer for tachycardia however was thought to be due to anxiety.  She did a follow-up with Coletta Memos, NP in our office to did not make any significant changes in her medicines.  Since then she has done well.  She says she has been sheltering at home.  She plans to  have her vaccine tomorrow in [facility], but was originally scheduled to have it done through [facility] however this was canceled.  PMHx:  Past Medical History:  Diagnosis Date  . Anxiety   . Arthritis   . Dysrhythmia    bradycardia  . History of bronchitis   . Hyperlipidemia   . Hypertension   . Seasonal allergies     Past Surgical History:  Procedure Laterality Date  . ABDOMINAL AORTIC ANEURYSM REPAIR     2009, Dr. Scot Dock  . CARDIAC CATHETERIZATION     patient thinks it  was in 09 with dr Scot Dock  . cataracts removed    . INCONTINENCE SURGERY    . TOTAL HIP ARTHROPLASTY Right 08/01/2014   Procedure: RIGHT TOTAL HIP ARTHROPLASTY ANTERIOR APPROACH;  Surgeon: Marianna Payment, MD;  Location: [place];  Service: Orthopedics;  Laterality: Right;  . TOTAL HIP ARTHROPLASTY Left 03/27/2016   Procedure: LEFT TOTAL HIP ARTHROPLASTY ANTERIOR APPROACH;  Surgeon: Leandrew Koyanagi, MD;  Location: [place];  Service: Orthopedics;  Laterality: Left;    FAMHx:  Family History  Problem Relation Age of Onset  . Heart failure Father   . Stroke Mother     SOCHx:   reports that she quit smoking about 35 years ago. She has a 30.00 pack-year smoking history. She has never used smokeless tobacco. She reports that she does not drink alcohol or use drugs.  ALLERGIES:  Allergies  Allergen Reactions  . Percocet [Oxycodone-Acetaminophen] Other (See Comments)    Makes head feel funny    ROS: Pertinent items noted in HPI and remainder of comprehensive ROS otherwise negative.  HOME MEDS: Current Outpatient Medications  Medication Sig Dispense Refill  . amLODipine (NORVASC) 10 MG tablet Take 1 tablet by mouth once daily 90 tablet 3  . aspirin EC 81 MG tablet Take 81 mg by mouth daily.    . cephALEXin (KEFLEX) 500 MG capsule Take 1 capsule (500 mg total) by mouth 4 (four) times daily. 28 capsule 0  . cetirizine (ZYRTEC) 10 MG tablet Take 10 mg by mouth at bedtime.     Marland Kitchen escitalopram (LEXAPRO) 10 MG tablet Take 10 mg by mouth daily.    Marland Kitchen losartan (COZAAR) 50 MG tablet TAKE 1 TABLET BY MOUTH ONCE DAILY 90 tablet 3  . lovastatin (MEVACOR) 20 MG tablet TAKE 1 TABLET BY MOUTH AT BEDTIME 90 tablet 3  . MAGNESIUM CITRATE PO Take by mouth daily.    . metoprolol tartrate (LOPRESSOR) 25 MG tablet Take 1/2 (one-half) tablet by mouth twice daily 90 tablet 2  . Omega-3 Fatty Acids (FISH OIL) 1000 MG CAPS Take 2,000 mg by mouth at bedtime.     . TURMERIC PO Take 1 capsule by mouth daily.     No  current facility-administered medications for this visit.    LABS/IMAGING: No results found for this or any previous visit (from the past 48 hour(s)). No results found.  VITALS: BP (!) 158/72   Pulse (!) 51   Temp (!) 96.3 F (35.7 C)   Ht 5\' 4"  (1.626 m)   Wt 161 lb 3.2 oz (73.1 kg)   SpO2 95%   BMI 27.67 kg/m   EXAM: General appearance: alert and no distress Neck: no carotid bruit, no JVD and thyroid not enlarged, symmetric, no tenderness/mass/nodules Lungs: clear to auscultation bilaterally Heart: regular rate and rhythm, S1, S2 normal, no murmur, click, rub or gallop Abdomen: soft, non-tender; bowel sounds normal; no masses,  no organomegaly  Extremities: edema Trace to 1+ bilateral ankle edema Pulses: 2+ and symmetric Skin: Skin color, texture, turgor normal. No rashes or lesions Neurologic: Grossly normal Psych: Pleasant  EKG:  Sinus bradycardia first-degree AV block at 51-personally reviewed  ASSESSMENT: 1.  History of AAA status post repair in 2009 2.  Hypertension 3.  dyslipidemia 4.  murmur 5. Leg edema 6. Status post bilateral total hip arthroplasty 7. Asymptomatic bradycardia  PLAN: 1.    Mrs. Peitz continues to do well.  She denies any chest pain or worsening shortness of breath.  She had tachycardia over the summer which was attributed to anxiety but generally has an asymptomatic bradycardia.  Her EKG today shows the same.  She had a remote AAA repair which has been stable.  Her blood pressure is variable but generally well controlled.  Follow-up with me annually or sooner as necessary.   Pixie Casino, MD, [facility], [place] Director of the [facility] Lipid Disorders &  Cardiovascular [facility] Reduction Clinic Diplomate of the American Board of Clinical Lipidology Attending Cardiologist  Direct Dial: [phone]  Fax: [phone]  Website:  [email or web address] 07/20/2019, 2:01 PM

## 2019-07-23 ENCOUNTER — Ambulatory Visit: Payer: Medicare Other

## 2019-07-28 ENCOUNTER — Ambulatory Visit: Payer: Medicare Other

## 2019-07-31 ENCOUNTER — Ambulatory Visit: Payer: Medicare Other

## 2019-08-18 ENCOUNTER — Other Ambulatory Visit: Payer: Self-pay | Admitting: Internal Medicine

## 2019-11-17 ENCOUNTER — Ambulatory Visit: Payer: Self-pay

## 2019-11-17 ENCOUNTER — Encounter: Payer: Self-pay | Admitting: Orthopaedic Surgery

## 2019-11-17 ENCOUNTER — Ambulatory Visit: Payer: Medicare Other | Admitting: Orthopaedic Surgery

## 2019-11-17 ENCOUNTER — Other Ambulatory Visit: Payer: Self-pay

## 2019-11-17 DIAGNOSIS — M79671 Pain in right foot: Secondary | ICD-10-CM

## 2019-11-17 MED ORDER — PREDNISONE 10 MG (21) PO TBPK: ORAL_TABLET | ORAL | 0 refills | Status: DC

## 2019-11-17 NOTE — Progress Notes (Signed)
Office Visit Note   Patient: Mary Merritt           Date of Birth: October 07, 1927           MRN: [id number] Visit Date: 11/17/2019              Requested by: Katherina Mires, MD [address],  [place] [address] PCP: Katherina Mires, MD   Assessment & Plan: Visit Diagnoses:  1. Pain in right foot     Plan: Impression is right foot pain.  X-rays were negative for stress fracture although I explained to her that x-rays can sometimes be negative even with a stress fracture.  Fortunately the pain has improved some.  I would like to try a prednisone Dosepak and supportive shoes.  We talked about a cam boot but I am worried that at her age the 49 may cause imbalance and make her prone to falling.  She is in agreement.  I have asked her to follow-up with me in about a week or 2 if the foot does not improve after the Dosepak.  Follow-Up Instructions: Return if symptoms worsen or fail to improve.   Orders:  Orders Placed This Encounter  Procedures  . XR Foot Complete Right   Meds ordered this encounter  Medications  . predniSONE (STERAPRED UNI-PAK 21 TAB) 10 MG (21) TBPK tablet    Sig: Take as directed    Dispense:  21 tablet    Refill:  0      Procedures: No procedures performed   Clinical Data: No additional findings.   Subjective: Chief Complaint  Patient presents with  . Right Foot - Pain    Line is a 86 year old female who is a longtime patient of mine who comes in for evaluation of of acute onset of right foot pain that started last week without any known injuries or trauma.  It is worse with walking and standing and she has pain in her arch.  There is some pain that goes into the calf.  She has some numbness on the top of the foot.  Denies any back pain or radicular symptoms.  Denies any bowel or bladder dysfunction.  It has gotten slightly better.   Review of Systems  Constitutional: Negative.   HENT: Negative.   Eyes: Negative.     Respiratory: Negative.   Cardiovascular: Negative.   Endocrine: Negative.   Musculoskeletal: Negative.   Neurological: Negative.   Hematological: Negative.   Psychiatric/Behavioral: Negative.   All other systems reviewed and are negative.    Objective: Vital Signs: There were no vitals taken for this visit.  Physical Exam Vitals and nursing note reviewed.  Constitutional:      Appearance: She is well-developed.  Pulmonary:     Effort: Pulmonary effort is normal.  Skin:    General: Skin is warm.     Capillary Refill: Capillary refill takes less than 2 seconds.  Neurological:     Mental Status: She is alert and oriented to person, place, and time.  Psychiatric:        Behavior: Behavior normal.        Thought Content: Thought content normal.        Judgment: Judgment normal.     Ortho Exam Right foot shows tenderness in the mid arch underneath the second and third metatarsal shafts.  Heel is nontender.  Metatarsal heads are nontender.  No motor or sensory deficits.  No swelling. Specialty Comments:  No specialty comments available.  Imaging: XR Foot Complete Right  Result Date: 11/17/2019 No acute or structural abnormalities.    PMFS History: Patient Active Problem List   Diagnosis Date Noted  . Pain in right foot 11/17/2019  . Hyponatremia 11/22/2017  . Nondisplaced fracture of left tibial spine, subsequent encounter for closed fracture with routine healing 08/05/2017  . Chronic pain of left knee 07/21/2017  . Presence of left artificial hip joint 05/30/2016  . Hip joint replacement status 03/27/2016  . S/P hip replacement 07/27/2015  . Osteoarthritis of right hip 08/01/2014  . Essential hypertension 05/31/2014  . Mixed hyperlipidemia 05/31/2014  . S/P AAA repair 05/31/2014  . Bradycardia 05/31/2014  . Murmur 05/31/2014   Past Medical History:  Diagnosis Date  . Anxiety   . Arthritis   . Dysrhythmia    bradycardia  . History of bronchitis   .  Hyperlipidemia   . Hypertension   . Seasonal allergies     Family History  Problem Relation Age of Onset  . Heart failure Father   . Stroke Mother     Past Surgical History:  Procedure Laterality Date  . ABDOMINAL AORTIC ANEURYSM REPAIR     2009, Dr. Scot Dock  . CARDIAC CATHETERIZATION     patient thinks it was in 09 with dr Scot Dock  . cataracts removed    . INCONTINENCE SURGERY    . TOTAL HIP ARTHROPLASTY Right 08/01/2014   Procedure: RIGHT TOTAL HIP ARTHROPLASTY ANTERIOR APPROACH;  Surgeon: Marianna Payment, MD;  Location: [place];  Service: Orthopedics;  Laterality: Right;  . TOTAL HIP ARTHROPLASTY Left 03/27/2016   Procedure: LEFT TOTAL HIP ARTHROPLASTY ANTERIOR APPROACH;  Surgeon: Leandrew Koyanagi, MD;  Location: [place];  Service: Orthopedics;  Laterality: Left;   Social History   Occupational History  . Not on file  Tobacco Use  . Smoking status: Former Smoker    Packs/day: 1.00    Years: 30.00    Pack years: 30.00    Quit date: 05/31/1984    Years since quitting: 35.4  . Smokeless tobacco: Never Used  Substance and Sexual Activity  . Alcohol use: No    Alcohol/week: 0.0 standard drinks  . Drug use: No  . Sexual activity: Not on file

## 2019-12-10 ENCOUNTER — Other Ambulatory Visit: Payer: Self-pay | Admitting: *Deleted

## 2019-12-10 DIAGNOSIS — I739 Peripheral vascular disease, unspecified: Secondary | ICD-10-CM

## 2019-12-10 DIAGNOSIS — Z9889 Other specified postprocedural states: Secondary | ICD-10-CM

## 2019-12-22 ENCOUNTER — Encounter: Payer: Self-pay | Admitting: Vascular Surgery

## 2019-12-22 ENCOUNTER — Ambulatory Visit (INDEPENDENT_AMBULATORY_CARE_PROVIDER_SITE_OTHER)
Admission: RE | Admit: 2019-12-22 | Discharge: 2019-12-22 | Disposition: A | Discharge status: Home / Self Care | Payer: Medicare Other | Source: Ambulatory Visit | Attending: Vascular Surgery | Admitting: Vascular Surgery

## 2019-12-22 ENCOUNTER — Ambulatory Visit: Payer: Medicare Other | Admitting: Vascular Surgery

## 2019-12-22 ENCOUNTER — Other Ambulatory Visit: Payer: Self-pay

## 2019-12-22 ENCOUNTER — Ambulatory Visit (HOSPITAL_COMMUNITY)
Admission: RE | Admit: 2019-12-22 | Discharge: 2019-12-22 | Disposition: A | Discharge status: Home / Self Care | Payer: Medicare Other | Source: Ambulatory Visit | Attending: Vascular Surgery | Admitting: Vascular Surgery

## 2019-12-22 VITALS — BP 168/72 | HR 54 | Temp 97.4°F | Resp 20 | Ht 64.0 in | Wt 156.0 lb

## 2019-12-22 DIAGNOSIS — I70219 Atherosclerosis of native arteries of extremities with intermittent claudication, unspecified extremity: Secondary | ICD-10-CM

## 2019-12-22 DIAGNOSIS — Z9889 Other specified postprocedural states: Secondary | ICD-10-CM | POA: Diagnosis not present

## 2019-12-22 DIAGNOSIS — I739 Peripheral vascular disease, unspecified: Secondary | ICD-10-CM | POA: Insufficient documentation

## 2019-12-22 DIAGNOSIS — Z8679 Personal history of other diseases of the circulatory system: Secondary | ICD-10-CM | POA: Diagnosis not present

## 2019-12-22 NOTE — Progress Notes (Signed)
REASON FOR VISIT:   Right leg pain.  The consult is requested by Dr. Suzanna Obey.  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  She has stable claudication.  Her aortic graft is patent with good femoral pulses.  We have discussed the importance of a structured walking program.  We have explained how collaterals should gradually improve in her symptoms may very well improve without any intervention.  We have also discussed the importance of nutrition.  Fortunately she is not a smoker.  She is on aspirin and is on a statin.  If her symptoms progressed and I think it would be reasonable to consider arteriography to see if there is any endovascular approach on the right however given her age I like to avoid any invasive procedures if at all possible.  I would like to follow her closely and I will see her back in 3 months to see how she is doing.  She knows to call sooner if she has problems.   HPI:   Mary Merritt is a pleasant 86 y.o. female who was referred for evaluation of peripheral vascular disease.  I have reviewed the records from the referring office.  Patient has had the gradual onset of worsening right thigh pain which is worse with ambulation and improves with rest.  She has a history of bilateral hip replacements.  She also has a history of low back pain.  She underwent repair of an abdominal aortic aneurysm in 2009.  She was sent for vascular consultation.  On my history the patient developed a gradual onset of right calf claudication about a month ago.  Since she has been having some leg pain she has been walking less because of this.  She denies any history of rest pain or nonhealing ulcers.  Risk factors for peripheral vascular disease include hypertension and hypercholesterolemia.  She denies any history of diabetes, family history of premature cardiovascular disease, or tobacco use.  She is on  aspirin and is on a statin.  She has no significant cardiac history.  Past Medical History:  Diagnosis Date  . AAA (abdominal aortic aneurysm) ([place])   . Anxiety   . Arthritis   . Dysrhythmia    bradycardia  . History of bronchitis   . Hyperlipidemia   . Hypertension   . Seasonal allergies     Family History  Problem Relation Age of Onset  . Heart failure Father   . Stroke Mother     SOCIAL HISTORY: Social History   Tobacco Use  . Smoking status: Former Smoker    Packs/day: 1.00    Years: 30.00    Pack years: 30.00    Quit date: 05/31/1984    Years since quitting: 35.5  . Smokeless tobacco: Never Used  Substance Use Topics  . Alcohol use: No    Alcohol/week: 0.0 standard drinks    Allergies  Allergen Reactions  . Percocet [Oxycodone-Acetaminophen] Other (See Comments)    Makes head feel funny    Current Outpatient Medications  Medication Sig Dispense Refill  . amLODipine (NORVASC) 10 MG tablet Take 1 tablet by mouth once daily 90 tablet 3  . aspirin EC 81 MG tablet Take 81 mg by mouth daily.    . cetirizine (ZYRTEC) 10 MG tablet Take 10 mg by mouth at bedtime.     Marland Kitchen escitalopram (LEXAPRO) 10 MG tablet Take 10 mg by mouth daily.    Marland Kitchen  losartan (COZAAR) 50 MG tablet TAKE 1 TABLET BY MOUTH ONCE DAILY 90 tablet 3  . lovastatin (MEVACOR) 20 MG tablet TAKE 1 TABLET BY MOUTH AT BEDTIME 90 tablet 3  . MAGNESIUM CITRATE PO Take by mouth daily.    . metoprolol tartrate (LOPRESSOR) 25 MG tablet Take 1/2 (one-half) tablet by mouth twice daily 90 tablet 2  . Omega-3 Fatty Acids (FISH OIL) 1000 MG CAPS Take 2,000 mg by mouth at bedtime.     . TURMERIC PO Take 1 capsule by mouth daily.     No current facility-administered medications for this visit.    REVIEW OF SYSTEMS:  [X]  denotes positive finding, [ ]  denotes negative finding Cardiac  Comments:  Chest pain or chest pressure:    Shortness of breath upon exertion:    Short of breath when lying flat:    Irregular  heart rhythm:        Vascular    Pain in calf, thigh, or hip brought on by ambulation: x   Pain in feet at night that wakes you up from your sleep:     Blood clot in your veins:    Leg swelling:         Pulmonary    Oxygen at home:    Productive cough:     Wheezing:         Neurologic    Sudden weakness in arms or legs:     Sudden numbness in arms or legs:     Sudden onset of difficulty speaking or slurred speech:    Temporary loss of vision in one eye:     Problems with dizziness:         Gastrointestinal    Blood in stool:     Vomited blood:         Genitourinary    Burning when urinating:     Blood in urine:        Psychiatric    Major depression:         Hematologic    Bleeding problems:    Problems with blood clotting too easily:        Skin    Rashes or ulcers:        Constitutional    Fever or chills:     PHYSICAL EXAM:   Vitals:   12/22/19 0905  BP: (!) 168/72  Pulse: (!) 54  Resp: 20  Temp: (!) 97.4 F (36.3 C)  SpO2: 94%  Weight: 156 lb (70.8 kg)  Height: 5\' 4"  (1.626 m)    GENERAL: The patient is a well-nourished female, in no acute distress. The vital signs are documented above. CARDIAC: There is a regular rate and rhythm.  VASCULAR: I do not detect carotid bruits. She has palpable femoral pulses bilaterally. On the right side I cannot palpate a popliteal or pedal pulses. On the left side she does have a palpable popliteal pulse.  I cannot palpate pedal pulses. Both feet are warm and adequately perfused. She has no significant lower extremity swelling. PULMONARY: There is good air exchange bilaterally without wheezing or rales. ABDOMEN: Soft and non-tender with normal pitched bowel sounds.  MUSCULOSKELETAL: There are no major deformities or cyanosis. NEUROLOGIC: No focal weakness or paresthesias are detected. SKIN: There are no ulcers or rashes noted. PSYCHIATRIC: The patient has a normal affect.  DATA:    AORTIC DUPLEX: I have  independently interpreted her aortic duplex scan today.  The maximum diameter of the aorta is 2.8  cm.  The right and left iliac arteries are essentially normal in size.  ARTERIAL DOPPLER STUDY: I have independently interpreted her arterial Doppler study today.  On the right side there is a monophasic dorsalis pedis and posterior tibial signal.  ABI is 64%.  Toe pressures 48 mmHg.  On the left side there is a biphasic dorsalis pedis and posterior tibial signal.  ABI is 95%.  Toe pressure is 89 mmHg.  [name] [facility] [phone]

## 2019-12-25 ENCOUNTER — Other Ambulatory Visit: Payer: Self-pay | Admitting: Adult Health

## 2020-02-28 ENCOUNTER — Other Ambulatory Visit: Payer: Self-pay | Admitting: Adult Health

## 2020-03-29 ENCOUNTER — Other Ambulatory Visit: Payer: Self-pay

## 2020-03-29 ENCOUNTER — Ambulatory Visit: Payer: Medicare Other | Admitting: Vascular Surgery

## 2020-03-29 ENCOUNTER — Encounter: Payer: Self-pay | Admitting: Vascular Surgery

## 2020-03-29 VITALS — BP 146/72 | HR 50 | Temp 97.2°F | Resp 20 | Ht 64.0 in | Wt 156.0 lb

## 2020-03-29 DIAGNOSIS — I739 Peripheral vascular disease, unspecified: Secondary | ICD-10-CM | POA: Diagnosis not present

## 2020-03-29 NOTE — Progress Notes (Signed)
REASON FOR VISIT:   Follow-up of peripheral vascular disease  MEDICAL ISSUES:   PERIPHERAL VASCULAR DISEASE: This patient has worsening claudication of the right lower extremity.  She feels that her symptoms are significantly disabling and she is developing some rest pain in the right foot.  I am reluctant to consider an aggressive approach given her age and debilitated state however she feels that she cannot tolerate the symptoms.  Therefore I recommended we proceed with arteriography.  Given that she has an aortofemoral graft after her aneurysm repair in 2009 I do not think we will be able to do an endovascular approach.  However this will allow [place] to see what our options are I have reviewed with the patient the indications for arteriography. In addition, I have reviewed the potential complications of arteriography including but not limited to: Bleeding, arterial injury, arterial thrombosis, dye action, renal insufficiency, or other unpredictable medical problems. I have explained to the patient that if we find disease amenable to angioplasty we could potentially address this at the same time. I have discussed the potential complications of angioplasty and stenting, including but not limited to: Bleeding, arterial thrombosis, arterial injury, dissection, or the need for surgical intervention.  This has been scheduled for 04/07/2020.  She has some mild history of chronic kidney disease.  If her creatinine is elevated we may consider limited study of the right lower extremity only and could potentially require CO2.  She is on aspirin and is on a statin.  I will also try to obtain her op note from 2009.   HPI:   Mary Merritt is a pleasant 86 y.o. female who underwent an abdominal aortic aneurysm repair in 2009.  I do not have this op note.  I last saw her on 12/22/2019 with peripheral vascular disease.  She had evidence of infrainguinal arterial occlusive disease on the right with stable  claudication.  Her aortic graft was patent with palpable femoral pulses.  We discussed a structured walking program.  Also discussed the importance of nutrition.  Fortunately, she is not a smoker.  She was on aspirin and was on a statin.  I felt that if her symptoms progressed we could consider arteriography to see if there might be an endovascular approach she comes in for a routine follow-up visit.  Since I saw her last she continues to have significant pain in the right leg which begins in the hip and continues all the way down to her foot.  This occurs at a short distance.  She has no symptoms on the left leg.  She describes some early rest pain in the right foot.  She has no history of nonhealing wounds.  She is not a smoker.  Past Medical History:  Diagnosis Date  . AAA (abdominal aortic aneurysm) ([place])   . Anxiety   . Arthritis   . Dysrhythmia    bradycardia  . History of bronchitis   . Hyperlipidemia   . Hypertension   . Seasonal allergies     Family History  Problem Relation Age of Onset  . Heart failure Father   . Stroke Mother     SOCIAL HISTORY: Social History   Tobacco Use  . Smoking status: Former Smoker    Packs/day: 1.00    Years: 30.00    Pack years: 30.00    Quit date: 05/31/1984    Years since quitting: 35.8  . Smokeless tobacco: Never Used  Substance Use Topics  . Alcohol  use: No    Alcohol/week: 0.0 standard drinks    Allergies  Allergen Reactions  . Percocet [Oxycodone-Acetaminophen] Other (See Comments)    Makes head feel funny    Current Outpatient Medications  Medication Sig Dispense Refill  . amLODipine (NORVASC) 10 MG tablet Take 1 tablet by mouth once daily 90 tablet 3  . aspirin EC 81 MG tablet Take 81 mg by mouth daily.    . cetirizine (ZYRTEC) 10 MG tablet Take 10 mg by mouth at bedtime.     Marland Kitchen escitalopram (LEXAPRO) 10 MG tablet Take 10 mg by mouth daily.    Marland Kitchen losartan (COZAAR) 50 MG tablet TAKE 1 TABLET BY MOUTH ONCE DAILY 90 tablet  3  . lovastatin (MEVACOR) 20 MG tablet TAKE 1 TABLET BY MOUTH AT BEDTIME 90 tablet 3  . MAGNESIUM CITRATE PO Take by mouth daily.    . metoprolol tartrate (LOPRESSOR) 25 MG tablet Take 0.5 tablets (12.5 mg total) by mouth 2 (two) times daily. 90 tablet 3  . Omega-3 Fatty Acids (FISH OIL) 1000 MG CAPS Take 2,000 mg by mouth at bedtime.     . TURMERIC PO Take 1 capsule by mouth daily.     No current facility-administered medications for this visit.    REVIEW OF SYSTEMS:  [X]  denotes positive finding, [ ]  denotes negative finding Cardiac  Comments:  Chest pain or chest pressure:    Shortness of breath upon exertion:    Short of breath when lying flat:    Irregular heart rhythm:        Vascular    Pain in calf, thigh, or hip brought on by ambulation: x   Pain in feet at night that wakes you up from your sleep:  x   Blood clot in your veins:    Leg swelling:         Pulmonary    Oxygen at home:    Productive cough:     Wheezing:         Neurologic    Sudden weakness in arms or legs:     Sudden numbness in arms or legs:     Sudden onset of difficulty speaking or slurred speech:    Temporary loss of vision in one eye:     Problems with dizziness:         Gastrointestinal    Blood in stool:     Vomited blood:         Genitourinary    Burning when urinating:     Blood in urine:        Psychiatric    Major depression:         Hematologic    Bleeding problems:    Problems with blood clotting too easily:        Skin    Rashes or ulcers:        Constitutional    Fever or chills:     PHYSICAL EXAM:   Vitals:   03/29/20 1049  BP: (!) 146/72  Pulse: (!) 50  Resp: 20  Temp: (!) 97.2 F (36.2 C)  SpO2: 96%  Weight: 156 lb (70.8 kg)  Height: 5\' 4"  (1.626 m)    GENERAL: The patient is a well-nourished female, in no acute distress. The vital signs are documented above. CARDIAC: There is a regular rate and rhythm.  VASCULAR: I do not detect carotid bruits. She has  palpable femoral pulses. I cannot palpate pedal pulses.  Both feet are adequately  perfused. She has no significant lower extremity swelling. PULMONARY: There is good air exchange bilaterally without wheezing or rales. ABDOMEN: Soft and non-tender with normal pitched bowel sounds.  MUSCULOSKELETAL: There are no major deformities or cyanosis. NEUROLOGIC: No focal weakness or paresthesias are detected. SKIN: There are no ulcers or rashes noted. PSYCHIATRIC: The patient has a normal affect.  DATA:    ARTERIAL DOPPLER: I reviewed her arterial Doppler from 12/22/2019.  On the right side she has a monophasic dorsalis pedis and posterior tibial signal with an ABI of 64% and a toe pressure of 48 mmHg.  On the left side she has a biphasic dorsalis pedis and posterior tibial signal.  ABIs 95% and toe pressures 89 mmHg.  DUPLEX OF THE ABDOMINAL AORTA: I reviewed the study from 12/22/2019.  Her aortic graft was patent with no areas of concern noted.  LABS: Most recent labs I have are from 12/28/2018.  Her GFR was 46 at that time with creatinine 1.08.  [name] [facility] [phone]

## 2020-03-29 NOTE — H&P (View-Only) (Signed)
REASON FOR VISIT:   Follow-up of peripheral vascular disease  MEDICAL ISSUES:   PERIPHERAL VASCULAR DISEASE: This patient has worsening claudication of the right lower extremity.  She feels that her symptoms are significantly disabling and she is developing some rest pain in the right foot.  I am reluctant to consider an aggressive approach given her age and debilitated state however she feels that she cannot tolerate the symptoms.  Therefore I recommended we proceed with arteriography.  Given that she has an aortofemoral graft after her aneurysm repair in 2009 I do not think we will be able to do an endovascular approach.  However this will allow [place] to see what our options are I have reviewed with the patient the indications for arteriography. In addition, I have reviewed the potential complications of arteriography including but not limited to: Bleeding, arterial injury, arterial thrombosis, dye action, renal insufficiency, or other unpredictable medical problems. I have explained to the patient that if we find disease amenable to angioplasty we could potentially address this at the same time. I have discussed the potential complications of angioplasty and stenting, including but not limited to: Bleeding, arterial thrombosis, arterial injury, dissection, or the need for surgical intervention.  This has been scheduled for 04/07/2020.  She has some mild history of chronic kidney disease.  If her creatinine is elevated we may consider limited study of the right lower extremity only and could potentially require CO2.  She is on aspirin and is on a statin.  I will also try to obtain her op note from 2009.   HPI:   Mary Merritt is a pleasant 86 y.o. female who underwent an abdominal aortic aneurysm repair in 2009.  I do not have this op note.  I last saw her on 12/22/2019 with peripheral vascular disease.  She had evidence of infrainguinal arterial occlusive disease on the right with stable  claudication.  Her aortic graft was patent with palpable femoral pulses.  We discussed a structured walking program.  Also discussed the importance of nutrition.  Fortunately, she is not a smoker.  She was on aspirin and was on a statin.  I felt that if her symptoms progressed we could consider arteriography to see if there might be an endovascular approach she comes in for a routine follow-up visit.  Since I saw her last she continues to have significant pain in the right leg which begins in the hip and continues all the way down to her foot.  This occurs at a short distance.  She has no symptoms on the left leg.  She describes some early rest pain in the right foot.  She has no history of nonhealing wounds.  She is not a smoker.  Past Medical History:  Diagnosis Date  . AAA (abdominal aortic aneurysm) ([place])   . Anxiety   . Arthritis   . Dysrhythmia    bradycardia  . History of bronchitis   . Hyperlipidemia   . Hypertension   . Seasonal allergies     Family History  Problem Relation Age of Onset  . Heart failure Father   . Stroke Mother     SOCIAL HISTORY: Social History   Tobacco Use  . Smoking status: Former Smoker    Packs/day: 1.00    Years: 30.00    Pack years: 30.00    Quit date: 05/31/1984    Years since quitting: 35.8  . Smokeless tobacco: Never Used  Substance Use Topics  . Alcohol  use: No    Alcohol/week: 0.0 standard drinks    Allergies  Allergen Reactions  . Percocet [Oxycodone-Acetaminophen] Other (See Comments)    Makes head feel funny    Current Outpatient Medications  Medication Sig Dispense Refill  . amLODipine (NORVASC) 10 MG tablet Take 1 tablet by mouth once daily 90 tablet 3  . aspirin EC 81 MG tablet Take 81 mg by mouth daily.    . cetirizine (ZYRTEC) 10 MG tablet Take 10 mg by mouth at bedtime.     Marland Kitchen escitalopram (LEXAPRO) 10 MG tablet Take 10 mg by mouth daily.    Marland Kitchen losartan (COZAAR) 50 MG tablet TAKE 1 TABLET BY MOUTH ONCE DAILY 90 tablet  3  . lovastatin (MEVACOR) 20 MG tablet TAKE 1 TABLET BY MOUTH AT BEDTIME 90 tablet 3  . MAGNESIUM CITRATE PO Take by mouth daily.    . metoprolol tartrate (LOPRESSOR) 25 MG tablet Take 0.5 tablets (12.5 mg total) by mouth 2 (two) times daily. 90 tablet 3  . Omega-3 Fatty Acids (FISH OIL) 1000 MG CAPS Take 2,000 mg by mouth at bedtime.     . TURMERIC PO Take 1 capsule by mouth daily.     No current facility-administered medications for this visit.    REVIEW OF SYSTEMS:  [X]  denotes positive finding, [ ]  denotes negative finding Cardiac  Comments:  Chest pain or chest pressure:    Shortness of breath upon exertion:    Short of breath when lying flat:    Irregular heart rhythm:        Vascular    Pain in calf, thigh, or hip brought on by ambulation: x   Pain in feet at night that wakes you up from your sleep:  x   Blood clot in your veins:    Leg swelling:         Pulmonary    Oxygen at home:    Productive cough:     Wheezing:         Neurologic    Sudden weakness in arms or legs:     Sudden numbness in arms or legs:     Sudden onset of difficulty speaking or slurred speech:    Temporary loss of vision in one eye:     Problems with dizziness:         Gastrointestinal    Blood in stool:     Vomited blood:         Genitourinary    Burning when urinating:     Blood in urine:        Psychiatric    Major depression:         Hematologic    Bleeding problems:    Problems with blood clotting too easily:        Skin    Rashes or ulcers:        Constitutional    Fever or chills:     PHYSICAL EXAM:   Vitals:   03/29/20 1049  BP: (!) 146/72  Pulse: (!) 50  Resp: 20  Temp: (!) 97.2 F (36.2 C)  SpO2: 96%  Weight: 156 lb (70.8 kg)  Height: 5\' 4"  (1.626 m)    GENERAL: The patient is a well-nourished female, in no acute distress. The vital signs are documented above. CARDIAC: There is a regular rate and rhythm.  VASCULAR: I do not detect carotid bruits. She has  palpable femoral pulses. I cannot palpate pedal pulses.  Both feet are adequately  perfused. She has no significant lower extremity swelling. PULMONARY: There is good air exchange bilaterally without wheezing or rales. ABDOMEN: Soft and non-tender with normal pitched bowel sounds.  MUSCULOSKELETAL: There are no major deformities or cyanosis. NEUROLOGIC: No focal weakness or paresthesias are detected. SKIN: There are no ulcers or rashes noted. PSYCHIATRIC: The patient has a normal affect.  DATA:    ARTERIAL DOPPLER: I reviewed her arterial Doppler from 12/22/2019.  On the right side she has a monophasic dorsalis pedis and posterior tibial signal with an ABI of 64% and a toe pressure of 48 mmHg.  On the left side she has a biphasic dorsalis pedis and posterior tibial signal.  ABIs 95% and toe pressures 89 mmHg.  DUPLEX OF THE ABDOMINAL AORTA: I reviewed the study from 12/22/2019.  Her aortic graft was patent with no areas of concern noted.  LABS: Most recent labs I have are from 12/28/2018.  Her GFR was 46 at that time with creatinine 1.08.  [name] [facility] [phone]

## 2020-04-06 ENCOUNTER — Other Ambulatory Visit (HOSPITAL_COMMUNITY)
Admission: RE | Admit: 2020-04-06 | Discharge: 2020-04-06 | Disposition: A | Discharge status: Home / Self Care | Payer: Medicare Other | Source: Ambulatory Visit | Attending: Vascular Surgery | Admitting: Vascular Surgery

## 2020-04-06 DIAGNOSIS — Z20822 Contact with and (suspected) exposure to covid-19: Secondary | ICD-10-CM | POA: Insufficient documentation

## 2020-04-06 DIAGNOSIS — Z01818 Encounter for other preprocedural examination: Secondary | ICD-10-CM | POA: Diagnosis present

## 2020-04-06 LAB — SARS CORONAVIRUS 2 (TAT 6-24 HRS): SARS Coronavirus 2: NEGATIVE

## 2020-04-07 ENCOUNTER — Ambulatory Visit (HOSPITAL_COMMUNITY): Payer: Medicare Other

## 2020-04-07 ENCOUNTER — Encounter (HOSPITAL_COMMUNITY)
Admission: RE | Disposition: A | Discharge status: Home / Self Care | Payer: Self-pay | Source: Home / Self Care | Attending: Vascular Surgery

## 2020-04-07 ENCOUNTER — Encounter: Payer: Self-pay | Admitting: Vascular Surgery

## 2020-04-07 ENCOUNTER — Ambulatory Visit (HOSPITAL_COMMUNITY)
Admission: RE | Admit: 2020-04-07 | Discharge: 2020-04-07 | Disposition: A | Discharge status: Home / Self Care | Payer: Medicare Other | Attending: Vascular Surgery | Admitting: Vascular Surgery

## 2020-04-07 DIAGNOSIS — Y9302 Activity, running: Secondary | ICD-10-CM

## 2020-04-07 DIAGNOSIS — I10 Essential (primary) hypertension: Secondary | ICD-10-CM | POA: Diagnosis not present

## 2020-04-07 DIAGNOSIS — Z87891 Personal history of nicotine dependence: Secondary | ICD-10-CM | POA: Diagnosis not present

## 2020-04-07 DIAGNOSIS — E785 Hyperlipidemia, unspecified: Secondary | ICD-10-CM | POA: Diagnosis not present

## 2020-04-07 DIAGNOSIS — F419 Anxiety disorder, unspecified: Secondary | ICD-10-CM | POA: Diagnosis not present

## 2020-04-07 DIAGNOSIS — I70221 Atherosclerosis of native arteries of extremities with rest pain, right leg: Secondary | ICD-10-CM | POA: Diagnosis present

## 2020-04-07 DIAGNOSIS — I714 Abdominal aortic aneurysm, without rupture: Secondary | ICD-10-CM | POA: Diagnosis not present

## 2020-04-07 DIAGNOSIS — Z79899 Other long term (current) drug therapy: Secondary | ICD-10-CM | POA: Insufficient documentation

## 2020-04-07 DIAGNOSIS — W19XXXA Unspecified fall, initial encounter: Secondary | ICD-10-CM

## 2020-04-07 DIAGNOSIS — Z7982 Long term (current) use of aspirin: Secondary | ICD-10-CM | POA: Insufficient documentation

## 2020-04-07 HISTORY — PX: ABDOMINAL AORTOGRAM W/LOWER EXTREMITY: CATH118223

## 2020-04-07 LAB — POCT I-STAT, CHEM 8
BUN: 12 mg/dL (ref 8–23)
Calcium, Ion: 1.23 mmol/L (ref 1.15–1.40)
Chloride: 94 mmol/L — ABNORMAL LOW (ref 98–111)
Creatinine, Ser: 0.9 mg/dL (ref 0.44–1.00)
Glucose, Bld: 124 mg/dL — ABNORMAL HIGH (ref 70–99)
HCT: 41 % (ref 36.0–46.0)
Hemoglobin: 13.9 g/dL (ref 12.0–15.0)
Potassium: 4.2 mmol/L (ref 3.5–5.1)
Sodium: 130 mmol/L — ABNORMAL LOW (ref 135–145)
TCO2: 26 mmol/L (ref 22–32)

## 2020-04-07 SURGERY — ABDOMINAL AORTOGRAM W/LOWER EXTREMITY
Anesthesia: LOCAL | Laterality: Right

## 2020-04-07 MED ORDER — HEPARIN (PORCINE) IN NACL 1000-0.9 UT/500ML-% IV SOLN
INTRAVENOUS | Status: AC
  Filled 2020-04-07: qty 1000

## 2020-04-07 MED ORDER — LIDOCAINE HCL (PF) 1 % IJ SOLN
INTRAMUSCULAR | Status: DC | PRN
  Administered 2020-04-07: 15 mL

## 2020-04-07 MED ORDER — HEPARIN (PORCINE) IN NACL 1000-0.9 UT/500ML-% IV SOLN
INTRAVENOUS | Status: DC | PRN
  Administered 2020-04-07: 500 mL

## 2020-04-07 MED ORDER — LABETALOL HCL 5 MG/ML IV SOLN: 10.0000 mg | INTRAVENOUS | Status: DC | PRN

## 2020-04-07 MED ORDER — LIDOCAINE HCL (PF) 1 % IJ SOLN
INTRAMUSCULAR | Status: AC
  Filled 2020-04-07: qty 30

## 2020-04-07 MED ORDER — SODIUM CHLORIDE 0.9 % WEIGHT BASED INFUSION: 1.0000 mL/kg/h | INTRAVENOUS | Status: DC

## 2020-04-07 MED ORDER — SODIUM CHLORIDE 0.9 % IV SOLN: 250.0000 mL | INTRAVENOUS | Status: DC | PRN

## 2020-04-07 MED ORDER — ACETAMINOPHEN 325 MG PO TABS: 650.0000 mg | ORAL_TABLET | ORAL | Status: DC | PRN

## 2020-04-07 MED ORDER — ONDANSETRON HCL 4 MG/2ML IJ SOLN: 4.0000 mg | Freq: Four times a day (QID) | INTRAMUSCULAR | Status: DC | PRN

## 2020-04-07 MED ORDER — IODIXANOL 320 MG/ML IV SOLN: INTRAVENOUS | Status: DC | PRN

## 2020-04-07 MED ORDER — SODIUM CHLORIDE 0.9 % IV SOLN: INTRAVENOUS | Status: DC

## 2020-04-07 MED ORDER — SODIUM CHLORIDE 0.9% FLUSH: 3.0000 mL | INTRAVENOUS | Status: DC | PRN

## 2020-04-07 MED ORDER — HYDRALAZINE HCL 20 MG/ML IJ SOLN: 5.0000 mg | INTRAMUSCULAR | Status: DC | PRN

## 2020-04-07 MED ORDER — SODIUM CHLORIDE 0.9% FLUSH: 3.0000 mL | Freq: Two times a day (BID) | INTRAVENOUS | Status: DC

## 2020-04-07 SURGICAL SUPPLY — 12 items
CATH ANGIO 5F PIGTAIL 65CM (CATHETERS) ×1 IMPLANT
CATH SOFTOUCH MOTARJEME 5F (CATHETERS) ×1 IMPLANT
CATH STRAIGHT 5FR 65CM (CATHETERS) ×1 IMPLANT
KIT MICROPUNCTURE NIT STIFF (SHEATH) ×1 IMPLANT
KIT PV (KITS) ×2 IMPLANT
SHEATH PINNACLE 5F 10CM (SHEATH) ×1 IMPLANT
SHEATH PROBE COVER 6X72 (BAG) ×1 IMPLANT
SYR MEDRAD MARK V 150ML (SYRINGE) ×1 IMPLANT
TRANSDUCER W/STOPCOCK (MISCELLANEOUS) ×2 IMPLANT
TRAY PV CATH (CUSTOM PROCEDURE TRAY) ×2 IMPLANT
WIRE BENTSON .035X145CM (WIRE) ×1 IMPLANT
WIRE ROSEN-J .035X260CM (WIRE) ×1 IMPLANT

## 2020-04-07 NOTE — Progress Notes (Signed)
Site area: left groin arterial sheath pulled by [place] Site Prior to Removal:  Level 0 Pressure Applied For: 20 minutes Manual:   yes Patient Status During Pull:  stable Post Pull Site:  Level 0 Post Pull Instructions Given:  yes Post Pull Pulses Present: left pt dopplered Dressing Applied:  Gauze and tegaderm Bedrest begins @ 1320 Comments:

## 2020-04-07 NOTE — Interval H&P Note (Signed)
History and Physical Interval Note:  04/07/2020 11:30 AM  Mary Merritt  has presented today for surgery, with the diagnosis of pvd.  The various methods of treatment have been discussed with the patient and family. After consideration of risks, benefits and other options for treatment, the patient has consented to  Procedure(s): ABDOMINAL AORTOGRAM W/LOWER EXTREMITY (N/A) as a surgical intervention.  The patient's history has been reviewed, patient examined, no change in status, stable for surgery.  I have reviewed the patient's chart and labs.  Questions were answered to the patient's satisfaction.     [name]

## 2020-04-07 NOTE — Discharge Instructions (Signed)
Femoral Site Care This sheet gives you information about how to care for yourself after your procedure. Your health care provider may also give you more specific instructions. If you have problems or questions, contact your health care provider. What can I expect after the procedure? After the procedure, it is common to have:  Bruising that usually fades within 1-2 weeks.  Tenderness at the site. Follow these instructions at home: Wound care  Follow instructions from your health care provider about how to take care of your insertion site. Make sure you: ? Wash your hands with soap and water before you change your bandage (dressing). If soap and water are not available, use hand sanitizer. ? Change your dressing as told by your health care provider. ? Leave stitches (sutures), skin glue, or adhesive strips in place. These skin closures may need to stay in place for 2 weeks or longer. If adhesive strip edges start to loosen and curl up, you may trim the loose edges. Do not remove adhesive strips completely unless your health care provider tells you to do that.  Do not take baths, swim, or use a hot tub until your health care provider approves.  You may shower 24-48 hours after the procedure or as told by your health care provider. ? Gently wash the site with plain soap and water. ? Pat the area dry with a clean towel. ? Do not rub the site. This may cause bleeding.  Do not apply powder or lotion to the site. Keep the site clean and dry.  Check your femoral site every day for signs of infection. Check for: ? Redness, swelling, or pain. ? Fluid or blood. ? Warmth. ? Pus or a bad smell. Activity  For the first 2-3 days after your procedure, or as long as directed: ? Avoid climbing stairs as much as possible. ? Do not squat.  Do not lift anything that is heavier than 10 lb (4.5 kg), or the limit that you are told, until your health care provider says that it is safe.  Rest as  directed. ? Avoid sitting for a long time without moving. Get up to take short walks every 1-2 hours.  Do not drive for 24 hours if you were given a medicine to help you relax (sedative). General instructions  Take over-the-counter and prescription medicines only as told by your health care provider.  Keep all follow-up visits as told by your health care provider. This is important. Contact a health care provider if you have:  A fever or chills.  You have redness, swelling, or pain around your insertion site. Get help right away if:  The catheter insertion area swells very fast.  You pass out.  You suddenly start to sweat or your skin gets clammy.  The catheter insertion area is bleeding, and the bleeding does not stop when you hold steady pressure on the area.  The area near or just beyond the catheter insertion site becomes pale, cool, tingly, or numb. These symptoms may represent a serious problem that is an emergency. Do not wait to see if the symptoms will go away. Get medical help right away. Call your local emergency services (911 in the [place] Do not drive yourself to the hospital. Summary  After the procedure, it is common to have bruising that usually fades within 1-2 weeks.  Check your femoral site every day for signs of infection.  Do not lift anything that is heavier than 10 lb (4.5 kg), or the   limit that you are told, until your health care provider says that it is safe. This information is not intended to replace advice given to you by your health care provider. Make sure you discuss any questions you have with your health care provider. Document Revised: 06/23/2017 Document Reviewed: 06/23/2017 Elsevier Patient Education  2020 [employer] Inc[employer]  

## 2020-04-07 NOTE — Progress Notes (Signed)
Discharge instructions reviewed with pt and her daughter [name] (via telephone) Both voice understanding.

## 2020-04-07 NOTE — Progress Notes (Signed)
Small bruise   noted to left elbow. Pt states that it is not hurting.

## 2020-04-07 NOTE — Op Note (Signed)
   PATIENT: Mary Merritt      MRN: [id number] DOB: 07-02-1927    DATE OF PROCEDURE: 04/07/2020  INDICATIONS:    [name] is a 86 y.o. 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 She presented with disabling claudication of the right lower extremity and rest pain. She felt that her symptoms were not tolerable and therefore we elected to proceed with arteriography.  PROCEDURE:    1. Ultrasound-guided access to left limb of aortofemoral bypass graft 2. Aortogram with bilateral iliac arteriogram 3. Selective catheterization of right limb of aortofemoral bypass graft 4. Right lower extremity runoff  SURGEON: Judeth Cornfield. Scot Dock, MD, FACS  ANESTHESIA: Local  EBL: Minimal  TECHNIQUE:  Both groins were prepped and draped in the usual sterile fashion.  Under ultrasound guidance, after the skin was anesthetized, I cannulated the left limb of the aortofemoral bypass graft with a micropuncture needle and a micropuncture sheath was introduced over a wire.  This was exchanged for a 5 [place] sheath over a Rosen wire.  By ultrasound the femoral artery was patent. A real-time image was obtained and sent to the server.  A pigtail catheter was positioned above the renals and flush aortogram obtained. The catheter was in position above the bifurcation of the graft and an oblique iliac projection was obtained. I then exchanged the pigtail catheter for a shepherd's hook catheter which was positioned into the right limb of the aortofemoral bypass graft. Selective right lower extremity runoff film was obtained. The catheter was then removed over a wire. Of note prior to doing this I advanced the wire into the right iliac system and exchanged the shepherd's hook catheter for a straight catheter. I measured a pullback pressure across the proximal aspect of the right limb of the aortofemoral graft and there was no pressure gradient at rest.  The wire was then removed. The  patient was transferred to the holding area for removal of the sheath. No immediate complications were noted.  FINDINGS:   1. There are 2 renal arteries on the right and the left renal artery is not identified. 2. The aorto bifemoral bypass graft is patent. 3. In order to limit contrast only study the right lower extremity. The deep femoral artery is occluded at its origin. The proximal superficial femoral artery is patent. There is moderate to severe diffuse disease throughout the superficial femoral artery and popliteal artery on the right. The below-knee popliteal artery segment is patent and there is two-vessel runoff via the peroneal and posterior tibial arteries. The anterior tibial artery is occluded.  CLINICAL NOTE: Given her age and medical comorbidities I would not recommend right femoropopliteal bypass grafting unless she developed a nonhealing wound with threatened limb loss. I will see her back for follow-up in 6 weeks.   [name], MD, FACS [facility]  DATE OF DICTATION:   04/07/2020

## 2020-04-07 NOTE — Progress Notes (Signed)
Pt fell walking into short stay for her procedure. I did not witness the fall.  She reports her left knee"gave way"  She braced her fall with her elbows.  She has bruising to her left elbow, no abrasions,  she reports she did not hit her head.  Dr Scot Dock notified.  STAT xray of left elbow.  Will continue to monitor.

## 2020-04-10 ENCOUNTER — Encounter (HOSPITAL_COMMUNITY): Payer: Self-pay | Admitting: Vascular Surgery

## 2020-04-10 MED ORDER — IODIXANOL 320 MG/ML IV SOLN
INTRAVENOUS | Status: DC | PRN
  Administered 2020-04-07: 100 mL via INTRA_ARTERIAL

## 2020-04-10 NOTE — Progress Notes (Signed)
MD notified at 1030, pt refused family notification, xray of left elbow ordered, left arm elevated, progress note created.

## 2020-05-10 ENCOUNTER — Encounter (HOSPITAL_COMMUNITY): Payer: Medicare Other

## 2020-05-14 ENCOUNTER — Inpatient Hospital Stay (HOSPITAL_COMMUNITY)
Admission: EM | Admit: 2020-05-14 | Discharge: 2020-05-16 | DRG: 180 | Disposition: A | Discharge status: Home Health | Payer: Medicare Other | Attending: Family Medicine | Admitting: Family Medicine

## 2020-05-14 ENCOUNTER — Encounter (HOSPITAL_COMMUNITY): Payer: Self-pay | Admitting: Emergency Medicine

## 2020-05-14 ENCOUNTER — Emergency Department (HOSPITAL_COMMUNITY): Payer: Medicare Other

## 2020-05-14 DIAGNOSIS — E871 Hypo-osmolality and hyponatremia: Secondary | ICD-10-CM | POA: Diagnosis present

## 2020-05-14 DIAGNOSIS — J910 Malignant pleural effusion: Secondary | ICD-10-CM | POA: Diagnosis present

## 2020-05-14 DIAGNOSIS — Z7982 Long term (current) use of aspirin: Secondary | ICD-10-CM

## 2020-05-14 DIAGNOSIS — Z823 Family history of stroke: Secondary | ICD-10-CM

## 2020-05-14 DIAGNOSIS — I440 Atrioventricular block, first degree: Secondary | ICD-10-CM | POA: Diagnosis present

## 2020-05-14 DIAGNOSIS — C3411 Malignant neoplasm of upper lobe, right bronchus or lung: Secondary | ICD-10-CM | POA: Diagnosis not present

## 2020-05-14 DIAGNOSIS — R9389 Abnormal findings on diagnostic imaging of other specified body structures: Secondary | ICD-10-CM

## 2020-05-14 DIAGNOSIS — E782 Mixed hyperlipidemia: Secondary | ICD-10-CM | POA: Diagnosis present

## 2020-05-14 DIAGNOSIS — Z8249 Family history of ischemic heart disease and other diseases of the circulatory system: Secondary | ICD-10-CM

## 2020-05-14 DIAGNOSIS — D75839 Thrombocytosis, unspecified: Secondary | ICD-10-CM | POA: Diagnosis present

## 2020-05-14 DIAGNOSIS — E878 Other disorders of electrolyte and fluid balance, not elsewhere classified: Secondary | ICD-10-CM | POA: Diagnosis present

## 2020-05-14 DIAGNOSIS — I10 Essential (primary) hypertension: Secondary | ICD-10-CM | POA: Diagnosis present

## 2020-05-14 DIAGNOSIS — M1990 Unspecified osteoarthritis, unspecified site: Secondary | ICD-10-CM | POA: Diagnosis present

## 2020-05-14 DIAGNOSIS — D649 Anemia, unspecified: Secondary | ICD-10-CM | POA: Diagnosis present

## 2020-05-14 DIAGNOSIS — C782 Secondary malignant neoplasm of pleura: Secondary | ICD-10-CM | POA: Diagnosis present

## 2020-05-14 DIAGNOSIS — Z79899 Other long term (current) drug therapy: Secondary | ICD-10-CM

## 2020-05-14 DIAGNOSIS — F419 Anxiety disorder, unspecified: Secondary | ICD-10-CM | POA: Diagnosis present

## 2020-05-14 DIAGNOSIS — J302 Other seasonal allergic rhinitis: Secondary | ICD-10-CM | POA: Diagnosis present

## 2020-05-14 DIAGNOSIS — Z8679 Personal history of other diseases of the circulatory system: Secondary | ICD-10-CM

## 2020-05-14 DIAGNOSIS — Z885 Allergy status to narcotic agent status: Secondary | ICD-10-CM

## 2020-05-14 DIAGNOSIS — F32A Depression, unspecified: Secondary | ICD-10-CM | POA: Diagnosis present

## 2020-05-14 DIAGNOSIS — Z20822 Contact with and (suspected) exposure to covid-19: Secondary | ICD-10-CM | POA: Diagnosis present

## 2020-05-14 DIAGNOSIS — J189 Pneumonia, unspecified organism: Secondary | ICD-10-CM | POA: Diagnosis present

## 2020-05-14 DIAGNOSIS — R0602 Shortness of breath: Secondary | ICD-10-CM | POA: Diagnosis not present

## 2020-05-14 DIAGNOSIS — Z96643 Presence of artificial hip joint, bilateral: Secondary | ICD-10-CM | POA: Diagnosis present

## 2020-05-14 DIAGNOSIS — I739 Peripheral vascular disease, unspecified: Secondary | ICD-10-CM | POA: Diagnosis present

## 2020-05-14 DIAGNOSIS — Z87891 Personal history of nicotine dependence: Secondary | ICD-10-CM

## 2020-05-14 LAB — CBC
HCT: 37.8 % (ref 36.0–46.0)
Hemoglobin: 12.7 g/dL (ref 12.0–15.0)
MCH: 29 pg (ref 26.0–34.0)
MCHC: 33.6 g/dL (ref 30.0–36.0)
MCV: 86.3 fL (ref 80.0–100.0)
Platelets: 440 10*3/uL — ABNORMAL HIGH (ref 150–400)
RBC: 4.38 MIL/uL (ref 3.87–5.11)
RDW: 12.4 % (ref 11.5–15.5)
WBC: 8.6 10*3/uL (ref 4.0–10.5)
nRBC: 0 % (ref 0.0–0.2)

## 2020-05-14 LAB — RESPIRATORY PANEL BY RT PCR (FLU A&B, COVID)
Influenza A by PCR: NEGATIVE
Influenza B by PCR: NEGATIVE
SARS Coronavirus 2 by RT PCR: NEGATIVE

## 2020-05-14 LAB — BASIC METABOLIC PANEL
Anion gap: 12 (ref 5–15)
BUN: 7 mg/dL — ABNORMAL LOW (ref 8–23)
CO2: 18 mmol/L — ABNORMAL LOW (ref 22–32)
Calcium: 9 mg/dL (ref 8.9–10.3)
Chloride: 88 mmol/L — ABNORMAL LOW (ref 98–111)
Creatinine, Ser: 0.74 mg/dL (ref 0.44–1.00)
GFR, Estimated: >60 mL/min (ref >60)
Glucose, Bld: 120 mg/dL — ABNORMAL HIGH (ref 70–99)
Potassium: 4.4 mmol/L (ref 3.5–5.1)
Sodium: 118 mmol/L — CL (ref 135–145)

## 2020-05-14 LAB — TROPONIN I (HIGH SENSITIVITY)
Troponin I (High Sensitivity): 6 ng/L (ref <18)
Troponin I (High Sensitivity): 9 ng/L (ref <18)

## 2020-05-14 MED ORDER — AMLODIPINE BESYLATE 10 MG PO TABS
10.0000 mg | ORAL_TABLET | Freq: Every day | ORAL | Status: DC
  Administered 2020-05-15 – 2020-05-16 (×2): 10 mg via ORAL
  Filled 2020-05-14 (×2): qty 1

## 2020-05-14 MED ORDER — ENOXAPARIN SODIUM 40 MG/0.4ML ~~LOC~~ SOLN
40.0000 mg | SUBCUTANEOUS | Status: DC
  Administered 2020-05-14 – 2020-05-15 (×2): 40 mg via SUBCUTANEOUS
  Filled 2020-05-14 (×3): qty 0.4

## 2020-05-14 MED ORDER — ALBUTEROL (5 MG/ML) CONTINUOUS INHALATION SOLN
10.0000 mg/h | INHALATION_SOLUTION | Freq: Once | RESPIRATORY_TRACT | Status: AC
  Administered 2020-05-14: 10 mg/h via RESPIRATORY_TRACT
  Filled 2020-05-14: qty 20

## 2020-05-14 MED ORDER — SODIUM CHLORIDE 0.9 % IV SOLN
2.0000 g | Freq: Once | INTRAVENOUS | Status: AC
  Administered 2020-05-14: 2 g via INTRAVENOUS
  Filled 2020-05-14: qty 2

## 2020-05-14 MED ORDER — ASPIRIN EC 81 MG PO TBEC
81.0000 mg | DELAYED_RELEASE_TABLET | Freq: Every day | ORAL | Status: DC
  Administered 2020-05-15 – 2020-05-16 (×2): 81 mg via ORAL
  Filled 2020-05-14 (×2): qty 1

## 2020-05-14 MED ORDER — SODIUM CHLORIDE 0.9 % IV SOLN
2.0000 g | Freq: Two times a day (BID) | INTRAVENOUS | Status: DC
  Administered 2020-05-15: 2 g via INTRAVENOUS
  Filled 2020-05-14 (×2): qty 2

## 2020-05-14 MED ORDER — LOSARTAN POTASSIUM 50 MG PO TABS
50.0000 mg | ORAL_TABLET | Freq: Every day | ORAL | Status: DC
  Administered 2020-05-15 – 2020-05-16 (×2): 50 mg via ORAL
  Filled 2020-05-14 (×2): qty 1

## 2020-05-14 MED ORDER — ESCITALOPRAM OXALATE 10 MG PO TABS
10.0000 mg | ORAL_TABLET | Freq: Every day | ORAL | Status: DC
  Administered 2020-05-15 – 2020-05-16 (×2): 10 mg via ORAL
  Filled 2020-05-14 (×2): qty 1

## 2020-05-14 MED ORDER — PRAVASTATIN SODIUM 10 MG PO TABS
20.0000 mg | ORAL_TABLET | Freq: Every day | ORAL | Status: DC
  Administered 2020-05-15: 20 mg via ORAL
  Filled 2020-05-14: qty 2

## 2020-05-14 MED ORDER — METOPROLOL TARTRATE 12.5 MG HALF TABLET
12.5000 mg | ORAL_TABLET | Freq: Two times a day (BID) | ORAL | Status: DC
  Administered 2020-05-14 – 2020-05-16 (×4): 12.5 mg via ORAL
  Filled 2020-05-14 (×4): qty 1

## 2020-05-14 MED ORDER — DOXYCYCLINE HYCLATE 100 MG PO TABS: 100.0000 mg | ORAL_TABLET | Freq: Two times a day (BID) | ORAL | Status: DC

## 2020-05-14 MED ORDER — SODIUM CHLORIDE 0.9 % IV SOLN
100.0000 mg | Freq: Once | INTRAVENOUS | Status: DC
  Administered 2020-05-14: 100 mg via INTRAVENOUS
  Filled 2020-05-14: qty 100

## 2020-05-14 MED ORDER — DEXAMETHASONE SODIUM PHOSPHATE 10 MG/ML IJ SOLN
10.0000 mg | Freq: Once | INTRAMUSCULAR | Status: AC
  Administered 2020-05-14: 10 mg via INTRAVENOUS
  Filled 2020-05-14: qty 1

## 2020-05-14 MED ORDER — LORATADINE 10 MG PO TABS
10.0000 mg | ORAL_TABLET | Freq: Every day | ORAL | Status: DC
  Administered 2020-05-15 – 2020-05-16 (×2): 10 mg via ORAL
  Filled 2020-05-14 (×2): qty 1

## 2020-05-14 MED ORDER — SODIUM CHLORIDE 0.9 % IV SOLN: Freq: Once | INTRAVENOUS | Status: AC

## 2020-05-14 NOTE — ED Provider Notes (Signed)
[facility] Provider Note   CSN: [id number] Arrival date & time: 05/14/20  1527     History Chief Complaint  Patient presents with  . Shortness of Breath    Mary Merritt is a 86 y.o. female with history of AAA s/p repair, hypertension, bradycardia, remote tobacco use, chronic rhinitis presents to the ED for shortness of breath.  This began earlier this month.  Initially was short of breath with exertion but no is constantly short of breath even when just sitting down.  She went to her primary care doctor on 11/4 and tested negative for Covid and was given azithromycin and albuterol inhaler.  She has been using albuterol every 4 hours and no longer helping.  She completed azithromycin 2 weeks ago.  Went back to PCP for continued shortness of breath this past Wednesday and had a chest x-ray and told she had pneumonia.  Has been taking doxycycline and Augmentin since 11/17.  Woke up this morning and felt more shortness of breath.  Continues to report drainage from her nose, mild cough with intermittent sputum.  Sputum is green in the morning and then clear throughout the day.  Has a headache.  Smokes cigarettes for about 20 to 25 years and quit at the age of 44.  No known or diagnosed COPD or asthma.  Does not use oxygen at home.  Had hip surgery 2017, abdominal aortogram October 2021.  No history of PE or DVT, active cancer/cancer treatment, hemoptysis, hormone therapy, prolonged immobilization, calf pain.  Reports mild chronic leg swelling but unchanged from baseline.  Denies fever, chills.  No chest pain.  No lightheadedness or syncope.  No orthopnea, PND.  HPI     Past Medical History:  Diagnosis Date  . AAA (abdominal aortic aneurysm) ([place])   . Anxiety   . Arthritis   . Dysrhythmia    bradycardia  . History of bronchitis   . Hyperlipidemia   . Hypertension   . Seasonal allergies     Patient Active Problem List   Diagnosis Date Noted  . Pain  in right foot 11/17/2019  . Hyponatremia 11/22/2017  . Nondisplaced fracture of left tibial spine, subsequent encounter for closed fracture with routine healing 08/05/2017  . Chronic pain of left knee 07/21/2017  . Presence of left artificial hip joint 05/30/2016  . Hip joint replacement status 03/27/2016  . S/P hip replacement 07/27/2015  . Osteoarthritis of right hip 08/01/2014  . Essential hypertension 05/31/2014  . Mixed hyperlipidemia 05/31/2014  . S/P AAA repair 05/31/2014  . Bradycardia 05/31/2014  . Murmur 05/31/2014    Past Surgical History:  Procedure Laterality Date  . ABDOMINAL AORTIC ANEURYSM REPAIR     2009, Dr. Scot Dock  . ABDOMINAL AORTOGRAM W/LOWER EXTREMITY Right 04/07/2020   Procedure: ABDOMINAL AORTOGRAM W/LOWER EXTREMITY;  Surgeon: Angelia Mould, MD;  Location: [facility];  Service: Cardiovascular;  Laterality: Right;  . CARDIAC CATHETERIZATION     patient thinks it was in 09 with dr Scot Dock  . cataracts removed    . INCONTINENCE SURGERY    . TOTAL HIP ARTHROPLASTY Right 08/01/2014   Procedure: RIGHT TOTAL HIP ARTHROPLASTY ANTERIOR APPROACH;  Surgeon: Marianna Payment, MD;  Location: [facility];  Service: Orthopedics;  Laterality: Right;  . TOTAL HIP ARTHROPLASTY Left 03/27/2016   Procedure: LEFT TOTAL HIP ARTHROPLASTY ANTERIOR APPROACH;  Surgeon: Leandrew Koyanagi, MD;  Location: [facility];  Service: Orthopedics;  Laterality: Left;  OB History   No obstetric history on file.     Family History  Problem Relation Age of Onset  . Heart failure Father   . Stroke Mother     Social History   Tobacco Use  . Smoking status: Former Smoker    Packs/day: 1.00    Years: 30.00    Pack years: 30.00    Quit date: 05/31/1984    Years since quitting: 35.9  . Smokeless tobacco: Never Used  Vaping Use  . Vaping Use: Never used  Substance Use Topics  . Alcohol use: No    Alcohol/week: 0.0 standard drinks  . Drug use: No    Home Medications Prior to  Admission medications   Medication Sig Start Date End Date Taking? Authorizing Provider  amLODipine (NORVASC) 10 MG tablet Take 1 tablet by mouth once daily Patient taking differently: Take 10 mg by mouth daily.  02/29/20   Pixie Casino, MD  aspirin EC 81 MG tablet Take 81 mg by mouth daily.    [provider]  cetirizine (ZYRTEC) 10 MG tablet Take 10 mg by mouth at bedtime.     [provider]  escitalopram (LEXAPRO) 10 MG tablet Take 10 mg by mouth daily.    [provider]  losartan (COZAAR) 50 MG tablet TAKE 1 TABLET BY MOUTH ONCE DAILY Patient taking differently: Take 50 mg by mouth daily.  07/24/18   Hilty, Nadean Corwin, MD  lovastatin (MEVACOR) 20 MG tablet TAKE 1 TABLET BY MOUTH AT BEDTIME Patient taking differently: Take 20 mg by mouth at bedtime.  08/18/19   Hilty, Nadean Corwin, MD  MAGNESIUM CITRATE PO Take 250 mg by mouth daily.     [provider]  metoprolol tartrate (LOPRESSOR) 25 MG tablet Take 0.5 tablets (12.5 mg total) by mouth 2 (two) times daily. 12/27/19   Hilty, Nadean Corwin, MD  Omega-3 Fatty Acids (FISH OIL) 1000 MG CAPS Take 2,000 mg by mouth at bedtime.     [provider]  Turmeric 500 MG TABS Take 1,000 mg by mouth daily.     [provider]    Allergies    Percocet [oxycodone-acetaminophen]  Review of Systems   Review of Systems  HENT: Positive for congestion and rhinorrhea.   Respiratory: Positive for cough and shortness of breath.   Cardiovascular: Positive for leg swelling (chrnic).  Neurological: Positive for headaches.  All other systems reviewed and are negative.   Physical Exam Updated Vital Signs BP (!) 141/61   Pulse 75   Temp 98.3 F (36.8 C) (Oral)   Resp 20   SpO2 93%   Physical Exam Vitals and nursing note reviewed.  Constitutional:      General: She is not in acute distress.    Appearance: She is well-developed.     Comments: Appears tired but nontoxic  HENT:     Head: Normocephalic and  atraumatic.     Right Ear: External ear normal.     Left Ear: External ear normal.     Nose: Nose normal.     Comments: Normal nasal mucosa, no rhinorrhea.    Mouth/Throat:     Comments: Moist mucous membranes.  Oropharynx and tonsils normal. Eyes:     General: No scleral icterus.    Conjunctiva/sclera: Conjunctivae normal.  Cardiovascular:     Rate and Rhythm: Normal rate and regular rhythm.     Heart sounds: Normal heart sounds. No murmur heard.      Comments: Minimal/trace  edema around ankles.  No calf tenderness. Pulmonary:     Effort: Pulmonary effort is normal.     Breath sounds: Decreased breath sounds and rales present. No wheezing.     Comments: SPO2 91%-93% at rest and on room air.  Normal work of breathing.  Speaking in full sentences.  Subtle rales in the right middle and lower lobes.  Diminished air sounds in the right side.  Left lung clear. Musculoskeletal:        General: No deformity. Normal range of motion.     Cervical back: Normal range of motion and neck supple.  Skin:    General: Skin is warm and dry.     Capillary Refill: Capillary refill takes less than 2 seconds.  Neurological:     Mental Status: She is alert and oriented to person, place, and time.  Psychiatric:        Behavior: Behavior normal.        Thought Content: Thought content normal.        Judgment: Judgment normal.     ED Results / Procedures / Treatments   Labs (all labs ordered are listed, but only abnormal results are displayed) Labs Reviewed  BASIC METABOLIC PANEL - Abnormal; Notable for the following components:      Result Value   Sodium 118 (*)    Chloride 88 (*)    CO2 18 (*)    Glucose, Bld 120 (*)    BUN 7 (*)    All other components within normal limits  CBC - Abnormal; Notable for the following components:   Platelets 440 (*)    All other components within normal limits  RESPIRATORY PANEL BY RT PCR (FLU A&B, COVID)  TROPONIN I (HIGH SENSITIVITY)  TROPONIN I (HIGH  SENSITIVITY)    EKG EKG Interpretation  Date/Time:  Sunday May 14 2020 15:31:44 EST Ventricular Rate:  76 PR Interval:  232 QRS Duration: 64 QT Interval:  374 QTC Calculation: 420 R Axis:   -42 Text Interpretation: Sinus rhythm with 1st degree A-V block Left axis deviation Low voltage QRS Inferior infarct , age undetermined Cannot rule out Anteroseptal infarct , age undetermined Abnormal ECG No significant change since last tracing Confirmed by Gareth Morgan [phone]) on 05/14/2020 5:59:00 PM   Radiology DG Chest Portable 1 View  Result Date: 05/14/2020 CLINICAL DATA:  Cough EXAM: PORTABLE CHEST 1 VIEW COMPARISON:  12/23/2018 FINDINGS: Diffuse airspace opacity throughout the right lung with volume loss and small right pleural effusion. Findings concerning for pneumonia. Heart is borderline in size. Left basilar scarring or atelectasis. IMPRESSION: Volume loss with diffuse airspace disease throughout the right lung and small right pleural effusion. Findings concerning for pneumonia. Electronically Signed   By: Rolm Baptise M.D.   On: 05/14/2020 17:09    Procedures Procedures (including critical care time)  Medications Ordered in ED Medications  doxycycline (VIBRAMYCIN) 100 mg in sodium chloride 0.9 % 250 mL IVPB (has no administration in time range)  0.9 %  sodium chloride infusion ( Intravenous New Bag/Given 05/14/20 1736)  albuterol (PROVENTIL,VENTOLIN) solution continuous neb (10 mg/hr Nebulization Given 05/14/20 1746)  ceFEPIme (MAXIPIME) 2 g in sodium chloride 0.9 % 100 mL IVPB (2 g Intravenous New Bag/Given 05/14/20 1739)  dexamethasone (DECADRON) injection 10 mg (10 mg Intravenous Given 05/14/20 1737)    ED Course  I have reviewed the triage vital signs and the nursing notes.  Pertinent labs & imaging results that were available during my care of the patient  were reviewed by me and considered in my medical decision making (see chart for details).  Clinical Course  as of May 14 1809  Sun May 14, 2020  1714 IMPRESSION: Volume loss with diffuse airspace disease throughout the right lung and small right pleural effusion. Findings concerning for pneumonia.  DG Chest Portable 1 View [CG]    Clinical Course User Index [CG] Arlean Hopping   MDM Rules/Calculators/A&P                          EMR, triage and nursing notes reviewed to obtain more history and assist with MDM  Seen twice by PCP this month for cough, rhinorrhea, SOB. Finished zpack now on dual antibiotic therapy doxy/augmentin since 11/17. CXR on 11/17 showed mass vs infiltrate RUL. Fully vaccinated for COVID with booster. Long but remote history of tobacco use.   Ddx includes worsening CAP vs empyema vs malignancy.  Considered PE however with cough, rhinorrhea, headache less likely.   Soft SpO2 here in ER at rest 90-93% at rest, rales in right lung and diminished.   ER work up initiated in triage   Labs ordered: CBC BMP troponin and respiratory panel  Imaging ordered: EKG CXR  Cont cardiac and pulse ox monitoring  ER work up personally visualized and interpreted   Labs reveals hyponatremia Na 118. No leukocytosis.   CXR reveals worsening right airspace disease with small pleural effusion, concerning for pneumonia  Meds ordered: albuterol cont neb, dexamethasone, cefepime and doxycycline  Patient re-evaluated and no clinical decline. Updated on POC to admit  Consults in ED: pharmacy for antibiotic selection, hospitalist  Will consult hospitalist for admission for further treatment.  Consider CT for further evaluation of pneumonia vs empyema vs malignancy? Discussed with EDP  Final Clinical Impression(s) / ED Diagnoses Final diagnoses:  Abnormal chest x-ray    Rx / DC Orders ED Discharge Orders    None       Kinnie Feil, PA-C 05/14/20 1810    Gareth Morgan, MD 05/15/20 [phone]

## 2020-05-14 NOTE — H&P (Signed)
[facility] Admission History and Physical Service Pager: [phone]  Patient name: Mary Merritt Medical record number: [id number] Date of birth: [birth year] Age: 86 y.o. Gender: female  Primary Care Provider: Katherina Mires, MD Consultants: None Code Status: Partial code (does not want compressions or shocks but is okay with everything else menses Preferred Emergency Contact: Son Ignatius Specking, [phone]  Chief Complaint: Shortness of breath, chest tightness, runny nose, productive cough  Assessment and Plan: Mary Merritt is a 86 y.o. female presenting with pneumonia which is failed outpatient management. PMH is significant for hypertension, hyperlipidemia, AAA s/p repair, left hip replacement, right hip osteoarthritis.  SOD likely 2/2 pneumonia Patient is afebrile, normotensive, does not have an elevated heart rate.  Reports that her symptoms have been persistent for approximately the past month.  She has received 2 separate treatments of outpatient antibiotics without resolution.  Recent outpatient antibiotic was doxycycline which she started on 11/17.  She reports that she smoked a pack of cigarettes every "few days" for approximately 25 years but that she quit smoking "years ago".  Patient also reports approximately 30 pound weight loss over the past "year or so".  She says that this is been unintentional weight loss although she says her appetite is not what it used to be and that she has been eating less.  Patient afebrile with WBC of 8.6. CXR cocnerning for pneumonia with volume loss and diffuse airspace disease throughout the right lung, small right pleural effusion. Troponin negative x2 (6, 9).  Differential includes hospital-acquired pneumonia after admission for angiography on 10/15.  This could also be CAP that is resistant to initial antibiotics.  Malignancy is also on the differential given weight loss and smoking history.  This is less likely but given  that the patient does not have a lot of infectious symptoms it is still in the differential. -Admit to MedSurg floor with Dr. Erin Hearing attending -Vitals per routine -Continue cefepime -Continuous pulse ox -Incentive spirometry -Ambulate patient -Consider chest CT for further evaluation of right lung opacity. -Morning CBC  Hyponatremia  hypochloremia Sodium 118 upon admission, chloride 88.  Patient reports that she had a mild episode of diarrhea yesterday but no other source for the hyponatremia.  Patient reports that she has had history of hyponatremia especially when she takes doxycycline.  She drinks 64 ounces of water daily because she was told that her kidney function has declined.  Patient reports a mild headache today which could be a symptom of the hyponatremia or could be related to illness.  Patient is not on a diuretic at home.  Differential for this is SIADH.  This could also be related to dietary changes. -Collecting urine sodium and urine osmolality -Free water restriction of less than 1500 mL a day -Patient is receiving hydration with NS when she receives antibiotic, limit excess free water -Morning CMP to evaluate sodium as well as albumin and liver function  Thrombocytosis Platelets 441 admission. -Continue to monitor  Hypertension  AAA s/p repair 2009 Blood pressure since arrival have ranged from 122/57-147/67.  Home meds include amlodipine 10 mg, aspirin 81 mg, losartan 50 mg, metoprolol 12.5 mg twice daily. PCP noted recent aortogram on 04/07/2020.  Aortogram showed 2 renal arteries on the right and the left renal artery was not identified.  Aorto bifemoral bypass graft was patent. 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  Proximal superficial femoral artery is patent with moderate to severe diffuse disease throughout the superficial femoral artery  and popliteal artery on the right.  They did not recommend femoropopliteal bypass grafting unless she develops nonhealing wounds with threatened limb loss. -Continue to monitor outpatient  Hyperlipidemia Home meds include lovastatin 20 mg nightly and fish oil 2,000 mg daily. -Continue home medications  Depression, anxiety At home takes Lexapro 10 mg daily. -Continue home Lexapro, consider taper/discontinuation given hyponatremia and this can exacerbate hyponatremia.  Seasonal allergies Take cetirizine 10 mg nightly and Benadryl as needed.   FEN/GI: Regular diet with fluid restriction less than 1500 mL/day Prophylaxis: Lovenox  Disposition: MedSurg  History of Present Illness:  Mary Merritt is a 86 y.o. female presenting with shortness of breath and congestion for the past month  Previously seen by PCP on 11/04 and 11/17 for nasal congestion, rhinorrhea, SOB, postnasal drip x2.5 weeks. At that time, noted productive cough. Patient is fully vaccinated for Covid with booster. Up-to-date on flu vaccine. Described shortness of breath as "chest tightness". At the time, SPO2 on room air 95%, lungs clear to auscultation, afebrile. At the visit on 11/04 was prescribed azithromycin and albuterol inhaler.  Was reevaluated on 11/17 and started on Augmentin and doxycycline.  She reports that she does not want to continue the Augmentin because she had an episode of diarrhea.  Patient reports that over the past few days her symptoms have just worsened even though she has been on treatment.  She also had diarrhea with the azithromycin so did not want to take any more of it.  She was concerned because of her worsening symptoms and worsening shortness of breath so she came into the emergency department.  Denies any chest pain, fever, chills, nausea, vomiting.  Denies any sick contacts.  Lives alone at home.  Her son lives close by.  Of note patient reports she has lost approximately 30 pounds over the last  year or so.  She reports this is unintentional but she does acknowledge that her appetite has not been what it was before.  Review Of Systems: Per HPI with the following additions:   Review of Systems  Constitutional: Positive for fatigue. Negative for chills and fever.  HENT: Positive for congestion and rhinorrhea. Negative for facial swelling and hearing loss.   Eyes: Negative for visual disturbance.  Respiratory: Positive for cough, chest tightness and shortness of breath.   Cardiovascular: Negative for chest pain.  Gastrointestinal: Positive for diarrhea. Negative for abdominal distention, abdominal pain, constipation, nausea and vomiting.  Endocrine: Positive for polyuria (Especially at night).  Genitourinary: Negative for decreased urine volume, difficulty urinating, dyspareunia and dysuria.     Patient Active Problem List   Diagnosis Date Noted  . Pneumonia 05/14/2020  . Pain in right foot 11/17/2019  . Hyponatremia 11/22/2017  . Nondisplaced fracture of left tibial spine, subsequent encounter for closed fracture with routine healing 08/05/2017  . Chronic pain of left knee 07/21/2017  . Presence of left artificial hip joint 05/30/2016  . Hip joint replacement status 03/27/2016  . S/P hip replacement 07/27/2015  . Osteoarthritis of right hip 08/01/2014  . Essential hypertension 05/31/2014  . Mixed hyperlipidemia 05/31/2014  . S/P AAA repair 05/31/2014  . Bradycardia 05/31/2014  . Murmur 05/31/2014    Past Medical History: Past Medical History:  Diagnosis Date  . AAA (abdominal aortic aneurysm) ([place])   . Anxiety   . Arthritis   . Dysrhythmia  bradycardia  . History of bronchitis   . Hyperlipidemia   . Hypertension   . Seasonal allergies     Past Surgical History: Past Surgical History:  Procedure Laterality Date  . ABDOMINAL AORTIC ANEURYSM REPAIR     2009, Dr. Scot Dock  . ABDOMINAL AORTOGRAM W/LOWER EXTREMITY Right 04/07/2020   Procedure: ABDOMINAL  AORTOGRAM W/LOWER EXTREMITY;  Surgeon: Angelia Mould, MD;  Location: [facility];  Service: Cardiovascular;  Laterality: Right;  . CARDIAC CATHETERIZATION     patient thinks it was in 09 with dr Scot Dock  . cataracts removed    . INCONTINENCE SURGERY    . TOTAL HIP ARTHROPLASTY Right 08/01/2014   Procedure: RIGHT TOTAL HIP ARTHROPLASTY ANTERIOR APPROACH;  Surgeon: Marianna Payment, MD;  Location: [facility];  Service: Orthopedics;  Laterality: Right;  . TOTAL HIP ARTHROPLASTY Left 03/27/2016   Procedure: LEFT TOTAL HIP ARTHROPLASTY ANTERIOR APPROACH;  Surgeon: Leandrew Koyanagi, MD;  Location: [facility];  Service: Orthopedics;  Laterality: Left;    Social History: Social History   Tobacco Use  . Smoking status: Former Smoker    Packs/day: 1.00    Years: 30.00    Pack years: 30.00    Quit date: 05/31/1984    Years since quitting: 35.9  . Smokeless tobacco: Never Used  Vaping Use  . Vaping Use: Never used  Substance Use Topics  . Alcohol use: No    Alcohol/week: 0.0 standard drinks  . Drug use: No   Please also refer to relevant sections of EMR.  Family History: Family History  Problem Relation Age of Onset  . Heart failure Father   . Stroke Mother     Allergies and Medications: Allergies  Allergen Reactions  . Percocet [Oxycodone-Acetaminophen] Other (See Comments)    Makes head feel funny   No current facility-administered medications on file prior to encounter.   Current Outpatient Medications on File Prior to Encounter  Medication Sig Dispense Refill  . amLODipine (NORVASC) 10 MG tablet Take 1 tablet by mouth once daily (Patient taking differently: Take 10 mg by mouth daily. ) 90 tablet 3  . aspirin EC 81 MG tablet Take 81 mg by mouth daily.    . cetirizine (ZYRTEC) 10 MG tablet Take 10 mg by mouth at bedtime.     Marland Kitchen escitalopram (LEXAPRO) 10 MG tablet Take 10 mg by mouth daily.    Marland Kitchen losartan (COZAAR) 50 MG tablet TAKE 1 TABLET BY MOUTH ONCE DAILY (Patient taking  differently: Take 50 mg by mouth daily. ) 90 tablet 3  . lovastatin (MEVACOR) 20 MG tablet TAKE 1 TABLET BY MOUTH AT BEDTIME (Patient taking differently: Take 20 mg by mouth at bedtime. ) 90 tablet 3  . MAGNESIUM CITRATE PO Take 250 mg by mouth daily.     . metoprolol tartrate (LOPRESSOR) 25 MG tablet Take 0.5 tablets (12.5 mg total) by mouth 2 (two) times daily. 90 tablet 3  . Omega-3 Fatty Acids (FISH OIL) 1000 MG CAPS Take 2,000 mg by mouth at bedtime.     . Turmeric 500 MG TABS Take 1,000 mg by mouth daily.       Objective: BP (!) 140/59   Pulse 90   Temp 98.3 F (36.8 C) (Oral)   Resp (!) 26   SpO2 100%  Physical Exam Vitals reviewed.  Constitutional:      General: She is not in acute distress.    Appearance: She is well-developed and normal weight.  HENT:  Head: Normocephalic and atraumatic.     Mouth/Throat:     Mouth: Mucous membranes are moist.     Pharynx: Oropharynx is clear. No pharyngeal swelling or oropharyngeal exudate.  Eyes:     Extraocular Movements: Extraocular movements intact.     Pupils: Pupils are equal, round, and reactive to light.  Cardiovascular:     Rate and Rhythm: Normal rate and regular rhythm.  Pulmonary:     Breath sounds: Examination of the right-upper field reveals decreased breath sounds and rales. Examination of the right-middle field reveals decreased breath sounds and rales. Examination of the right-lower field reveals decreased breath sounds and rales. Decreased breath sounds and rales present.  Chest:     Chest wall: No mass, tenderness or edema.  Abdominal:     General: Bowel sounds are normal.     Palpations: Abdomen is soft.     Tenderness: There is no abdominal tenderness. There is no guarding.  Musculoskeletal:     Cervical back: Normal range of motion and neck supple.     Right lower leg: Edema present.     Left lower leg: Edema present.     Comments: Trace lower extremities edema bilaterally which patient reports is  baseline.  [name] agrees that this is her baseline.  Lymphadenopathy:     Cervical: No cervical adenopathy.  Skin:    General: Skin is warm.     Findings: No ecchymosis.  Neurological:     General: No focal deficit present.     Mental Status: She is alert.     Cranial Nerves: No cranial nerve deficit.  Psychiatric:        Mood and Affect: Mood normal.        Behavior: Behavior normal.     Labs and Imaging: CBC BMET  Recent Labs  Lab 05/14/20 1548  WBC 8.6  HGB 12.7  HCT 37.8  PLT 440*   Recent Labs  Lab 05/14/20 1548  NA 118*  K 4.4  CL 88*  CO2 18*  BUN 7*  CREATININE 0.74  GLUCOSE 120*  CALCIUM 9.0     EKG: Normal sinus rhythm, normal axis, no ST elevation, narrow QRS  CXR portable 1 view 11/21 FINDINGS: Diffuse airspace opacity throughout the right lung with volume loss and small right pleural effusion. Findings concerning for pneumonia. Heart is borderline in size. Left basilar scarring or atelectasis. IMPRESSION: Volume loss with diffuse airspace disease throughout the right lung and small right pleural effusion. Findings concerning for pneumonia.   Gifford Shave, MD 05/14/2020, 6:57 PM PGY-2, [place] [facility] pager: [phone], text pages welcome

## 2020-05-14 NOTE — ED Triage Notes (Signed)
Pt arrives to ED with chief complaint of shortness of breath that began 2 week, she was placed on a steroid 2 weeks ago and then diagnosed with pneumonia and started on doxycyline & amoxicillin/clav  And still no better. She has shortness of breath, worse with any exertion.

## 2020-05-14 NOTE — ED Notes (Signed)
RN informed of elevated vital signs

## 2020-05-14 NOTE — Progress Notes (Signed)
Pharmacy Antibiotic Note  Mary Merritt is a 86 y.o. female admitted on 05/14/2020 with pneumonia.  Pharmacy has been consulted for Cefepime dosing.      Temp (24hrs), Avg:98.3 F (36.8 C), Min:98.3 F (36.8 C), Max:98.3 F (36.8 C)  Recent Labs  Lab 05/14/20 1548  WBC 8.6  CREATININE 0.74    CrCl cannot be calculated (Unknown ideal weight.).    Allergies  Allergen Reactions  . Percocet [Oxycodone-Acetaminophen] Other (See Comments)    Makes head feel funny    Antimicrobials this admission: 11/21 Cefepime >>    Dose adjustments this admission: N/a  Microbiology results: Pending   Plan:  Patient recently treated for PNA with Augmentin and doxy however worsening on xray. Broaden with cefepime to cover pseudomonas. - Cefepime 2g IV q12h - Monitor patients renal function and urine output  - De-escalate ABX when appropriate   Thank you for allowing pharmacy to be a part of this patient's care.  Duanne Limerick PharmD. BCPS 05/14/2020 6:57 PM

## 2020-05-15 ENCOUNTER — Observation Stay (HOSPITAL_COMMUNITY): Payer: Medicare Other

## 2020-05-15 DIAGNOSIS — Z7982 Long term (current) use of aspirin: Secondary | ICD-10-CM | POA: Diagnosis not present

## 2020-05-15 DIAGNOSIS — C782 Secondary malignant neoplasm of pleura: Secondary | ICD-10-CM | POA: Diagnosis present

## 2020-05-15 DIAGNOSIS — Z8249 Family history of ischemic heart disease and other diseases of the circulatory system: Secondary | ICD-10-CM | POA: Diagnosis not present

## 2020-05-15 DIAGNOSIS — D75839 Thrombocytosis, unspecified: Secondary | ICD-10-CM | POA: Diagnosis present

## 2020-05-15 DIAGNOSIS — M1990 Unspecified osteoarthritis, unspecified site: Secondary | ICD-10-CM | POA: Diagnosis present

## 2020-05-15 DIAGNOSIS — Z79899 Other long term (current) drug therapy: Secondary | ICD-10-CM | POA: Diagnosis not present

## 2020-05-15 DIAGNOSIS — R918 Other nonspecific abnormal finding of lung field: Secondary | ICD-10-CM | POA: Diagnosis not present

## 2020-05-15 DIAGNOSIS — I10 Essential (primary) hypertension: Secondary | ICD-10-CM | POA: Diagnosis present

## 2020-05-15 DIAGNOSIS — Z87891 Personal history of nicotine dependence: Secondary | ICD-10-CM | POA: Diagnosis not present

## 2020-05-15 DIAGNOSIS — F32A Depression, unspecified: Secondary | ICD-10-CM | POA: Diagnosis present

## 2020-05-15 DIAGNOSIS — R9389 Abnormal findings on diagnostic imaging of other specified body structures: Secondary | ICD-10-CM

## 2020-05-15 DIAGNOSIS — I739 Peripheral vascular disease, unspecified: Secondary | ICD-10-CM | POA: Diagnosis present

## 2020-05-15 DIAGNOSIS — E871 Hypo-osmolality and hyponatremia: Secondary | ICD-10-CM | POA: Diagnosis present

## 2020-05-15 DIAGNOSIS — J189 Pneumonia, unspecified organism: Secondary | ICD-10-CM | POA: Diagnosis present

## 2020-05-15 DIAGNOSIS — R0602 Shortness of breath: Secondary | ICD-10-CM | POA: Diagnosis present

## 2020-05-15 DIAGNOSIS — Z8679 Personal history of other diseases of the circulatory system: Secondary | ICD-10-CM | POA: Diagnosis not present

## 2020-05-15 DIAGNOSIS — Z96643 Presence of artificial hip joint, bilateral: Secondary | ICD-10-CM | POA: Diagnosis present

## 2020-05-15 DIAGNOSIS — J910 Malignant pleural effusion: Secondary | ICD-10-CM | POA: Diagnosis present

## 2020-05-15 DIAGNOSIS — C7802 Secondary malignant neoplasm of left lung: Secondary | ICD-10-CM | POA: Diagnosis not present

## 2020-05-15 DIAGNOSIS — F419 Anxiety disorder, unspecified: Secondary | ICD-10-CM | POA: Diagnosis present

## 2020-05-15 DIAGNOSIS — I440 Atrioventricular block, first degree: Secondary | ICD-10-CM | POA: Diagnosis present

## 2020-05-15 DIAGNOSIS — Z20822 Contact with and (suspected) exposure to covid-19: Secondary | ICD-10-CM | POA: Diagnosis present

## 2020-05-15 DIAGNOSIS — C771 Secondary and unspecified malignant neoplasm of intrathoracic lymph nodes: Secondary | ICD-10-CM | POA: Diagnosis not present

## 2020-05-15 DIAGNOSIS — J90 Pleural effusion, not elsewhere classified: Secondary | ICD-10-CM | POA: Diagnosis not present

## 2020-05-15 DIAGNOSIS — E782 Mixed hyperlipidemia: Secondary | ICD-10-CM | POA: Diagnosis present

## 2020-05-15 DIAGNOSIS — D649 Anemia, unspecified: Secondary | ICD-10-CM | POA: Diagnosis present

## 2020-05-15 DIAGNOSIS — Z823 Family history of stroke: Secondary | ICD-10-CM | POA: Diagnosis not present

## 2020-05-15 DIAGNOSIS — J302 Other seasonal allergic rhinitis: Secondary | ICD-10-CM | POA: Diagnosis present

## 2020-05-15 DIAGNOSIS — C3411 Malignant neoplasm of upper lobe, right bronchus or lung: Secondary | ICD-10-CM | POA: Diagnosis present

## 2020-05-15 LAB — CBC
HCT: 34.1 % — ABNORMAL LOW (ref 36.0–46.0)
Hemoglobin: 11.9 g/dL — ABNORMAL LOW (ref 12.0–15.0)
MCH: 29.5 pg (ref 26.0–34.0)
MCHC: 34.9 g/dL (ref 30.0–36.0)
MCV: 84.4 fL (ref 80.0–100.0)
Platelets: 441 10*3/uL — ABNORMAL HIGH (ref 150–400)
RBC: 4.04 MIL/uL (ref 3.87–5.11)
RDW: 12.5 % (ref 11.5–15.5)
WBC: 4.8 10*3/uL (ref 4.0–10.5)
nRBC: 0 % (ref 0.0–0.2)

## 2020-05-15 LAB — COMPREHENSIVE METABOLIC PANEL
ALT: 14 U/L (ref 0–44)
AST: 37 U/L (ref 15–41)
Albumin: 2.7 g/dL — ABNORMAL LOW (ref 3.5–5.0)
Alkaline Phosphatase: 61 U/L (ref 38–126)
Anion gap: 14 (ref 5–15)
BUN: 8 mg/dL (ref 8–23)
CO2: 19 mmol/L — ABNORMAL LOW (ref 22–32)
Calcium: 9.1 mg/dL (ref 8.9–10.3)
Chloride: 87 mmol/L — ABNORMAL LOW (ref 98–111)
Creatinine, Ser: 0.89 mg/dL (ref 0.44–1.00)
GFR, Estimated: >60 mL/min (ref >60)
Glucose, Bld: 183 mg/dL — ABNORMAL HIGH (ref 70–99)
Potassium: 3.9 mmol/L (ref 3.5–5.1)
Sodium: 120 mmol/L — ABNORMAL LOW (ref 135–145)
Total Bilirubin: 0.7 mg/dL (ref 0.3–1.2)
Total Protein: 5.6 g/dL — ABNORMAL LOW (ref 6.5–8.1)

## 2020-05-15 LAB — MAGNESIUM: Magnesium: 1.2 mg/dL — ABNORMAL LOW (ref 1.7–2.4)

## 2020-05-15 MED ORDER — MELATONIN 3 MG PO TABS
3.0000 mg | ORAL_TABLET | Freq: Every day | ORAL | Status: DC
  Administered 2020-05-15: 3 mg via ORAL
  Filled 2020-05-15: qty 1

## 2020-05-15 MED ORDER — MAGNESIUM SULFATE 4 GM/100ML IV SOLN
4.0000 g | Freq: Once | INTRAVENOUS | Status: AC
  Administered 2020-05-15: 4 g via INTRAVENOUS
  Filled 2020-05-15: qty 100

## 2020-05-15 MED ORDER — IOHEXOL 300 MG/ML  SOLN
75.0000 mL | Freq: Once | INTRAMUSCULAR | Status: AC | PRN
  Administered 2020-05-15: 75 mL via INTRAVENOUS

## 2020-05-15 MED ORDER — SODIUM CHLORIDE 0.9 % IV SOLN
2.0000 g | Freq: Two times a day (BID) | INTRAVENOUS | Status: DC
  Administered 2020-05-15 – 2020-05-16 (×2): 2 g via INTRAVENOUS
  Filled 2020-05-15 (×2): qty 2

## 2020-05-15 NOTE — Progress Notes (Signed)
Imaging reviewed, Dr. Silas Flood will evaluate in AM for bronch/thoracentesis vs. Hospice.  Erskine Emery MD PCCM

## 2020-05-15 NOTE — Plan of Care (Signed)

## 2020-05-15 NOTE — Progress Notes (Addendum)
Family Medicine Teaching Service Daily Progress Note Intern Pager: [phone]  Patient name: Mary Merritt Medical record number: [id number] Date of birth: 01-05-1928 Age: 86 y.o. Gender: female  Primary Care Provider: Katherina Mires, MD Consultants: None Code Status: Partial(does not want compressions or shocks but is okay with everything else. Chief Complaint: Shortness of breath, chest tightness, runny nose, productive cough  Assessment and Plan: Mary Merritt is a 86 y.o. female presenting with pneumonia which is failed outpatient management. PMH is significant for hypertension, hyperlipidemia, AAA s/p repair, left hip replacement, right hip osteoarthritis.  SOB likely 2/2 pneumonia Pt states she breaths much better now. Breathing comfortably at 94% at 2 L O2.Patient afebrile with WBC of 4.8 decreased from 8.6. CXR cocnerning for pneumonia with volume loss and diffuse airspace disease throughout the right lung, small right pleural effusion. Pt has H/o smoking 1 pack every few days stopped at Age 29. She was started on Cefipime.  -Vitals per routine -Continue cefepime -Continuous pulse ox -Incentive spirometry. -Ambulate patient.  Lung Mass  P t has extensive history of smoking. Stopped at age 49. Ct Chest was ordered.  CT scan revealed that Pt has 7.2 x 5.9 cm right upper lobe lung mass with direct invasion of the right hilum and mediastinum. Extensive mediastinal and right hilar lymphadenopathy. Extensive interstitial spread of tumor in the right lung and large loculated right pleural fluid collection likely secondary to pleural metastatic disease. Small scattered pulmonary nodules in the left lung suspicious for metastatic disease. 13 x 10.5 mm spiculated lesion in the lingula. -Will talk to Pt about options. -Pulm and Oncology consultation if Pt wants further testing and treatment. -Maintain O2 sat >92%.  Hyponatremia  hypochloremia Today, Sodium 120 and Cl of 87 ( 118 upon  admission, chloride 88). Albumin low at 2.7 and AST and ALT normal at 37 and 14. Patient reports that she had a mild episode of diarrhea a day before yesterday. Sending urine sodium and urine osmolality today to rule out SIADH. -Collecting urine sodium and urine osmolality -Free water restriction of less than 1500 mL a day - Limit excess free water  Hypomagnesemia Pt Mg is 1.2 today. -Mg Sulph 4 g Iv ordered.   Thrombocytosis Platelets 441. -Continue to monitor  Hypertension  AAA s/p repair 2009 Blood pressure stable at 127/80 mmHg Home meds include amlodipine 10 mg, aspirin 81 mg, losartan 50 mg, metoprolol 12.5 mg twice daily. PCP noted recent aortogram on 04/07/2020.  Aortogram showed 2 renal arteries on the right and the left renal artery was not identified.  Aorto bifemoral bypass graft was patent. -Continue home blood pressure medications -Vitals per routine   PAD Patient with a recent angiography showing right deep femoral artery is occluded at the origin.  Proximal superficial femoral artery is patent with moderate to severe diffuse disease throughout the superficial femoral artery and popliteal artery on the right.  They did not recommend femoropopliteal bypass grafting unless she develops nonhealing wounds with threatened limb loss. -Continue to monitor outpatient  Hyperlipidemia Home meds include lovastatin 20 mg nightly and fish oil 2,000 mg daily. -Continue home medications  Depression, anxiety At home takes Lexapro 10 mg daily. -Continue home Lexapro, consider taper/discontinuation given hyponatremia and this can exacerbate hyponatremia.  Seasonal allergies Take cetirizine 10 mg nightly and Benadryl as needed.  FEN/GI: Regular diet with fluid restriction less than 1500 mL/day Prophylaxis: Lovenox  Disposition: MedSurg   Subjective:  Pt states she can breath much better now.  Denies any complaints. Denies any SOB, Chest pain. Had 1 episode of loose stools  yesterday.  Objective: Temp:  [97.8 F (36.6 C)-98.8 F (37.1 C)] 98.3 F (36.8 C) (11/22 1452) Pulse Rate:  [68-111] 80 (11/22 1452) Resp:  [15-32] 18 (11/22 1452) BP: (122-147)/(55-122) 131/60 (11/22 1452) SpO2:  [91 %-100 %] 92 % (11/22 1452) FiO2 (%):  [2 %] 2 % (11/22 0222) Physical Exam: General: Breathing comfortably at 2L of O2. Not in acute stress. Cardiovascular: RRR, No M/G/R Respiratory: Wheezing in Rt upper side, Decreased breath sounds and rales in Rt side. Abdomen: Soft, non tender, BS +nt Extremities: Edema Present B/L, Pulses equal and strong.   Laboratory: Recent Labs  Lab 05/14/20 1548 05/15/20 0253  WBC 8.6 4.8  HGB 12.7 11.9*  HCT 37.8 34.1*  PLT 440* 441*   Recent Labs  Lab 05/14/20 1548 05/15/20 0253  NA 118* 120*  K 4.4 3.9  CL 88* 87*  CO2 18* 19*  BUN 7* 8  CREATININE 0.74 0.89  CALCIUM 9.0 9.1  PROT  --  5.6*  BILITOT  --  0.7  ALKPHOS  --  61  ALT  --  14  AST  --  37  GLUCOSE 120* 183*     Imaging/Diagnostic Tests: CT CHEST W CONTRAST  Result Date: 05/15/2020 CLINICAL DATA:  Evaluate right lung process seen on recent x-ray. EXAM: CT CHEST WITH CONTRAST TECHNIQUE: Multidetector CT imaging of the chest was performed during intravenous contrast administration. CONTRAST:  77mL OMNIPAQUE IOHEXOL 300 MG/ML  SOLN COMPARISON:  Chest x-ray 05/14/2020 FINDINGS: Cardiovascular: The heart is normal in size for age. No pericardial effusion. There is tortuosity, ectasia and calcification of the thoracic aorta. No dissection. Branch vessel calcifications are noted including three-vessel coronary artery calcifications. Mediastinum/Nodes: Extensive mediastinal and right hilar lymphadenopathy. 19 mm upper prevascular node on image 38/3. Left-sided anterior mediastinal nodal mass on image 31/3 measures 3.5 x 2.5 cm. Pretracheal lymph node on image number 50/3 measures 17 mm. Right hilar node on image number 66/3 measures 24 mm. 16 mm subcarinal lymph  node on image number 65/3. The esophagus is grossly normal. Lungs/Pleura: There is a large right upper lobe lung mass measuring approximately 7.2 x 5.9 cm with direct invasion of the right hilum and mediastinum. There is significant surrounding interstitial nodularity consistent with interstitial spread of tumor. This probably also endobronchial spread of tumor. Associated large loculated right pleural fluid collection likely secondary to pleural metastatic disease. Irregular pleural thickening in some areas is suspicious for tumor. There are also enlarged epicardial lymph nodes on the right side. Underlying emphysematous changes and pulmonary scarring. There are a few small scattered pulmonary nodules in the left lung suspicious for metastatic disease. There is also a somewhat spiculated appearing lesion in the lingula on image number 74/4 which measures 13 x 10.5 mm and could be a metastatic focus or a second primary/synchronous neoplasm. Upper Abdomen: No obvious lesions in the upper half of the liver. The adrenal glands are not covered. Musculoskeletal: No breast masses are identified. Scattered borderline right axillary lymph nodes. The bony thorax is intact. I do not see any definite lytic or sclerotic bone lesions to suggest metastatic disease. IMPRESSION: 1. 7.2 x 5.9 cm right upper lobe lung mass with direct invasion of the right hilum and mediastinum. Extensive mediastinal and right hilar lymphadenopathy. 2. Extensive interstitial spread of tumor in the right lung and large loculated right pleural fluid collection likely secondary to pleural metastatic disease. 3.  Small scattered pulmonary nodules in the left lung suspicious for metastatic disease. 13 x 10.5 mm spiculated lesion in the lingula could be a second primary. 4. PET-CT may be helpful for further evaluation and staging purposes, if necessary given the patient's age and clinical status. Aortic Atherosclerosis (ICD10-I70.0) and Emphysema  (ICD10-J43.9). Electronically Signed   By: Marijo Sanes M.D.   On: 05/15/2020 13:39   DG Chest Portable 1 View  Result Date: 05/14/2020 CLINICAL DATA:  Cough EXAM: PORTABLE CHEST 1 VIEW COMPARISON:  12/23/2018 FINDINGS: Diffuse airspace opacity throughout the right lung with volume loss and small right pleural effusion. Findings concerning for pneumonia. Heart is borderline in size. Left basilar scarring or atelectasis. IMPRESSION: Volume loss with diffuse airspace disease throughout the right lung and small right pleural effusion. Findings concerning for pneumonia. Electronically Signed   By: Rolm Baptise M.D.   On: 05/14/2020 17:09    Armando Reichert, MD 05/15/2020, 3:08 PM PGY-1, [place] Intern pager: [phone], text pages welcome

## 2020-05-15 NOTE — Progress Notes (Signed)
Went to bedside with Dr. Rosita Kea she discussed findings of the CT with patient and her daughter, Jocelyn Lamer. Explained that a final diagnosis cannot be made from the CT but did share objective findings as read by radiologist.  Family would like to move forward with consultation from pulmonology and oncology for further evaluation.   BP 131/60 (BP Location: Right Arm)   Pulse 80   Temp 98.3 F (36.8 C) (Oral)   Resp 18   SpO2 92%  General: Patient sitting up in chair, no respiratory distress.  No nasal cannula.  Spoke to on call providers for pulmonary and oncology.  Both have agreed for consult.  Anticipate thoracentesis for pleural effusion + pleural fluid studies. Oncology recommends tissue diagnosis. Will continue antibiotics at this time, though anticipate discontinuation with further work up.   Wilber Oliphant, M.D.  5:46 PM 05/15/2020

## 2020-05-16 ENCOUNTER — Other Ambulatory Visit: Payer: Self-pay

## 2020-05-16 ENCOUNTER — Encounter (HOSPITAL_COMMUNITY)
Admission: EM | Disposition: A | Discharge status: Home Health | Payer: Self-pay | Source: Home / Self Care | Attending: Family Medicine

## 2020-05-16 ENCOUNTER — Encounter (HOSPITAL_COMMUNITY): Payer: Self-pay | Admitting: Family Medicine

## 2020-05-16 ENCOUNTER — Other Ambulatory Visit: Payer: Self-pay | Admitting: *Deleted

## 2020-05-16 DIAGNOSIS — J90 Pleural effusion, not elsewhere classified: Secondary | ICD-10-CM | POA: Diagnosis not present

## 2020-05-16 DIAGNOSIS — C3491 Malignant neoplasm of unspecified part of right bronchus or lung: Secondary | ICD-10-CM

## 2020-05-16 DIAGNOSIS — C7802 Secondary malignant neoplasm of left lung: Secondary | ICD-10-CM

## 2020-05-16 DIAGNOSIS — R918 Other nonspecific abnormal finding of lung field: Secondary | ICD-10-CM | POA: Diagnosis not present

## 2020-05-16 DIAGNOSIS — C771 Secondary and unspecified malignant neoplasm of intrathoracic lymph nodes: Secondary | ICD-10-CM

## 2020-05-16 DIAGNOSIS — C3411 Malignant neoplasm of upper lobe, right bronchus or lung: Principal | ICD-10-CM

## 2020-05-16 DIAGNOSIS — R9389 Abnormal findings on diagnostic imaging of other specified body structures: Secondary | ICD-10-CM | POA: Diagnosis not present

## 2020-05-16 DIAGNOSIS — J910 Malignant pleural effusion: Secondary | ICD-10-CM

## 2020-05-16 HISTORY — PX: THORACENTESIS: SHX235

## 2020-05-16 LAB — BASIC METABOLIC PANEL
Anion gap: 9 (ref 5–15)
BUN: 7 mg/dL — ABNORMAL LOW (ref 8–23)
CO2: 24 mmol/L (ref 22–32)
Calcium: 8.9 mg/dL (ref 8.9–10.3)
Chloride: 91 mmol/L — ABNORMAL LOW (ref 98–111)
Creatinine, Ser: 0.71 mg/dL (ref 0.44–1.00)
GFR, Estimated: >60 mL/min (ref >60)
Glucose, Bld: 122 mg/dL — ABNORMAL HIGH (ref 70–99)
Potassium: 3.8 mmol/L (ref 3.5–5.1)
Sodium: 124 mmol/L — ABNORMAL LOW (ref 135–145)

## 2020-05-16 LAB — BODY FLUID CELL COUNT WITH DIFFERENTIAL
Eos, Fluid: 0 %
Lymphs, Fluid: 85 %
Monocyte-Macrophage-Serous Fluid: 4 % — ABNORMAL LOW (ref 50–90)
Neutrophil Count, Fluid: 11 % (ref 0–25)
Total Nucleated Cell Count, Fluid: 805 cu mm (ref 0–1000)

## 2020-05-16 LAB — LACTATE DEHYDROGENASE, PLEURAL OR PERITONEAL FLUID: LD, Fluid: 386 U/L — ABNORMAL HIGH (ref 3–23)

## 2020-05-16 LAB — PROTEIN, PLEURAL OR PERITONEAL FLUID: Total protein, fluid: 3.8 g/dL

## 2020-05-16 LAB — MAGNESIUM: Magnesium: 1.8 mg/dL (ref 1.7–2.4)

## 2020-05-16 SURGERY — THORACENTESIS
Anesthesia: Topical

## 2020-05-16 MED ORDER — MELATONIN 3 MG PO TABS: 3.0000 mg | ORAL_TABLET | Freq: Every day | ORAL | 0 refills | Status: DC

## 2020-05-16 NOTE — Progress Notes (Signed)
Discharge home.Discharge instruction given to Son, no question verbalized.

## 2020-05-16 NOTE — Progress Notes (Signed)
Family Medicine Teaching Service Daily Progress Note Intern Pager: [phone]  Patient name: Mary Merritt Medical record number: [id number] Date of birth: 1927/07/28 Age: 86 y.o. Gender: female  Primary Care Provider: Katherina Mires, MD Code Status: Partial(does not want compressions or shocks but is okay with everything else. Chief Complaint:Shortness of breath, chest tightness, runny nose, productive cough  Assessment and Plan: Mary Merritt a 86 y.o.femalepresenting with pneumonia which is failed outpatient management. PMH is significant forhypertension, hyperlipidemia, AAA s/p repair, left hip replacement, right hip osteoarthritis.  SOB likely 2/2 pneumonia Pt states she breaths much better now.  Breathing comfortably at 92% at room air.Patient afebrile with WBC of 4.8 yesterday. CXRcocnerningfor pneumonia with volume loss and diffuse airspace disease throughout the right lung, small right pleural effusion. Pt has H/o smoking 1 pack every few days stopped at Age 86. She was started on Cefipime.   PT recommends home health PT. -Vitals per routine -Stop cefepime -Continuous pulse ox -Incentive spirometry. --She may be able to discharge after thoracocentesis today.  Lung Mass  P t has extensive history of smoking. Stopped at age 86. CT Chest was ordered.  CT scan revealed that Pt has 7.2 x 5.9 cm right upper lobe lung mass with direct invasion of the right hilum and mediastinum. Extensive mediastinal and right hilar lymphadenopathy. Extensive interstitial spread of tumor in the right lung and large loculated right pleural fluid collection likely secondary to pleural metastatic disease. Small scattered pulmonary nodules in the left lung suspicious for metastatic disease. 13 x 10.5 mm spiculated lesion in the lingula. Dr Maudie Mercury and I talked to Pt yesterday about the options. Pt's Daughter was aslo there. Pt and Daughter both agreed to do further testing and consultation by Pulmonology. Dr  Maudie Mercury talked to Pulmonology and oncology they will see Pt today. Anticipate thoracentesis for pleural effusion + pleural fluid studies. Oncology recommends tissue diagnosis.  Pulm saw Pt today. Appreciate recommendations. Pulm recommended to do Thoracocentesis this afternoon at 2 pm and sending diagnostic studies including cytology.  Recommended follow-up with outpatient oncologist for further discussion regarding additional testing.  Pulmonology will sign off after procedure.  And will follow up with results of pleural studies via EMR. - Will stop antibiotics at this time. -Pulm and Oncology consulted.  Appreciate recommendations. -Thoracocentesis today at 2 PM. -Maintain O2 sat >92%. -She may be able to discharge after thoracocentesis today.  Hyponatremiahypochloremia Today, Sodium 124 and Cl of 91 ( 118 upon admission, chloride 88).Albumin was 2.7 yesterday Sending urine sodium and urine osmolality today to rule out SIADH. -Standing urine sodium and urine osmolality today. -Free water restriction of less than 1500 mL a day - Limit excess free water.  Hypomagnesemia Pt Mg is 1.8 today. Yesterday it was 1.2 and 4 mg Mg Sup was given. -Continue to monitor.  Thrombocytosis Platelets 441.  -Continue to monitor  HypertensionAAA s/p repair2009 Blood pressure stable at 144/53 mmHgHome meds include amlodipine 10 mg, aspirin 81 mg, losartan 50 mg, metoprolol 12.5 mg twice daily. PCP noted recent aortogram on 04/07/2020.Aortogram showed 2 renal arteries on the right and the left renal artery was not identified. Aorto bifemoral bypass graft was patent. -Continue home blood pressure medications -Vitals per routine   PAD Patient with a recent angiography showing right deep femoral artery is occluded at the origin. Proximal superficial femoral artery is patent with moderate to severe diffuse disease throughout the superficial femoral artery and popliteal artery on the right. They did  not recommend  femoropopliteal bypass grafting unless she develops nonhealing wounds with threatened limb loss. -Continue to monitor outpatient  Hyperlipidemia Home medsinclude lovastatin 20 mg nightly and fish oil2,000 mg daily. -Continue home medications  Depression, anxiety At home takes Lexapro 10 mg daily. -Continue home Lexapro, consider taper/discontinuation given hyponatremia and this can exacerbate hyponatremia.  Seasonal allergies Take cetirizine 10 mg nightly and Benadryl as needed.  FEN/GI:Regular dietwith fluid restriction less than 1500 mL/day Prophylaxis:Lovenox   Disposition: Med Surg To home at D/C  Subjective:  Pt states she breaths much better now. Pt states pulmonology saw her today and they will be removing some fluids from her lung and then she will decide what she wants to do next. She denies any chest pain, abdominal pain, nausea, vomiting, headache, and dizziness.  She states she may need something for sleep here in the hospital but she can sleep well at home.  Objective: Temp:  [97.6 F (36.4 C)-98.7 F (37.1 C)] 98.1 F (36.7 C) (11/23 0444) Pulse Rate:  [72-81] 81 (11/23 0444) Resp:  [16-18] 18 (11/23 0444) BP: (127-144)/(53-60) 144/53 (11/23 0444) SpO2:  [92 %-98 %] 92 % (11/23 0444) Physical Exam: General: Breathing comfortably ar room air. Not in acute stress. Cardiovascular: RRR, No M/G/R Respiratory: Wheezing in Rt upper side, Decreased breath sounds and rales in Rt side. Abdomen: Soft, non tender, BS +nt Extremities: Mild Edema Present B/L, Pulses equal and strong.   Laboratory: Recent Labs  Lab 05/14/20 1548 05/15/20 0253  WBC 8.6 4.8  HGB 12.7 11.9*  HCT 37.8 34.1*  PLT 440* 441*   Recent Labs  Lab 05/14/20 1548 05/15/20 0253  NA 118* 120*  K 4.4 3.9  CL 88* 87*  CO2 18* 19*  BUN 7* 8  CREATININE 0.74 0.89  CALCIUM 9.0 9.1  PROT  --  5.6*  BILITOT  --  0.7  ALKPHOS  --  61  ALT  --  14  AST  --  37   GLUCOSE 120* 183*      Imaging/Diagnostic Tests:CT CHEST W CONTRAST  Result Date: 05/15/2020 CLINICAL DATA:  Evaluate right lung process seen on recent x-ray. EXAM: CT CHEST WITH CONTRAST TECHNIQUE: Multidetector CT imaging of the chest was performed during intravenous contrast administration. CONTRAST:  17mL OMNIPAQUE IOHEXOL 300 MG/ML  SOLN COMPARISON:  Chest x-ray 05/14/2020 FINDINGS: Cardiovascular: The heart is normal in size for age. No pericardial effusion. There is tortuosity, ectasia and calcification of the thoracic aorta. No dissection. Branch vessel calcifications are noted including three-vessel coronary artery calcifications. Mediastinum/Nodes: Extensive mediastinal and right hilar lymphadenopathy. 19 mm upper prevascular node on image 38/3. Left-sided anterior mediastinal nodal mass on image 31/3 measures 3.5 x 2.5 cm. Pretracheal lymph node on image number 50/3 measures 17 mm. Right hilar node on image number 66/3 measures 24 mm. 16 mm subcarinal lymph node on image number 65/3. The esophagus is grossly normal. Lungs/Pleura: There is a large right upper lobe lung mass measuring approximately 7.2 x 5.9 cm with direct invasion of the right hilum and mediastinum. There is significant surrounding interstitial nodularity consistent with interstitial spread of tumor. This probably also endobronchial spread of tumor. Associated large loculated right pleural fluid collection likely secondary to pleural metastatic disease. Irregular pleural thickening in some areas is suspicious for tumor. There are also enlarged epicardial lymph nodes on the right side. Underlying emphysematous changes and pulmonary scarring. There are a few small scattered pulmonary nodules in the left lung suspicious for metastatic disease. There is also  a somewhat spiculated appearing lesion in the lingula on image number 74/4 which measures 13 x 10.5 mm and could be a metastatic focus or a second primary/synchronous neoplasm.  Upper Abdomen: No obvious lesions in the upper half of the liver. The adrenal glands are not covered. Musculoskeletal: No breast masses are identified. Scattered borderline right axillary lymph nodes. The bony thorax is intact. I do not see any definite lytic or sclerotic bone lesions to suggest metastatic disease. IMPRESSION: 1. 7.2 x 5.9 cm right upper lobe lung mass with direct invasion of the right hilum and mediastinum. Extensive mediastinal and right hilar lymphadenopathy. 2. Extensive interstitial spread of tumor in the right lung and large loculated right pleural fluid collection likely secondary to pleural metastatic disease. 3. Small scattered pulmonary nodules in the left lung suspicious for metastatic disease. 13 x 10.5 mm spiculated lesion in the lingula could be a second primary. 4. PET-CT may be helpful for further evaluation and staging purposes, if necessary given the patient's age and clinical status. Aortic Atherosclerosis (ICD10-I70.0) and Emphysema (ICD10-J43.9). Electronically Signed   By: Marijo Sanes M.D.   On: 05/15/2020 13:39   DG Chest Portable 1 View  Result Date: 05/14/2020 CLINICAL DATA:  Cough EXAM: PORTABLE CHEST 1 VIEW COMPARISON:  12/23/2018 FINDINGS: Diffuse airspace opacity throughout the right lung with volume loss and small right pleural effusion. Findings concerning for pneumonia. Heart is borderline in size. Left basilar scarring or atelectasis. IMPRESSION: Volume loss with diffuse airspace disease throughout the right lung and small right pleural effusion. Findings concerning for pneumonia. Electronically Signed   By: Rolm Baptise M.D.   On: 05/14/2020 17:09    Armando Reichert, MD 05/16/2020, 6:17 AM PGY-1, [place] Intern pager: [phone], text pages welcome

## 2020-05-16 NOTE — Discharge Instructions (Signed)
Dear Mary Merritt,   Thank you for letting us participate in your care! In this section, you will find a brief hospital admission summary of why you were admitted to the hospital, what happened during your admission, your diagnosis/diagnoses, and recommended follow up.   You were admitted because you were experiencing Shortness of Breath, chest tightness, runny nose, productive cough .   You were diagnosed with Lung Mass, Hyponatremia.  You were treated with Antibiotics, Thoracocentesis.   You were also seen by Pulmonology. They recommended Thoracocentesis and sending fluid for further testing.   Your symptoms improved and you were discharged from the hospital for meeting this goal.   The oncology team came to see you and they will set up Outpatient F/U for further evaluation.  POST-HOSPITAL & CARE INSTRUCTIONS 1. Free water restriction of less than 1000 mL a day. 2. F/U with Pulmonology. 3. F/U with Oncology for further management. 4. Please let PCP/Specialists know of any changes that were made.  5. Please see medications section of this packet for any medication changes.   DOCTOR'S APPOINTMENT & FOLLOW UP CARE INSTRUCTIONS  Future Appointments  Date Time Provider [place]  05/24/2020  8:40 AM Angelia Mould, MD [facility]  04/12/2021 10:00 AM Leandrew Koyanagi, MD [facility] None     Thank you for choosing [facility]! Take care and be well!  [facility]  [place], [place] [address] [phone]

## 2020-05-16 NOTE — Evaluation (Signed)
Physical Therapy Evaluation Patient Details Name: Mary Merritt MRN: [id number] DOB: 04-16-1928 Today's Date: 05/16/2020   History of Present Illness  Pt is 86 yo female admitted with worsening DOE.  She was admitted with PNE and lung mass. Pt scheduled for thoracentesis later today.   PMH is significant for HTN, hyperlipidemia, AAA s/p repair, L THA, and R hip OA.  Clinical Impression  Pt admitted with above diagnosis. Pt did well with therapy today.  She required supervision to min guard level for ambulation and transfers.  Demonstrated mild instability without AD - recommend use of RW at home initially. She did fatigue easily and reports DOE but O2 sats were 91% on RA.  Pt lives alone and is normally independent.  May benefit from HHPT to advance mobility at discharge. Pt currently with functional limitations due to the deficits listed below (see PT Problem List). Pt will benefit from skilled PT to increase their independence and safety with mobility to allow discharge to the venue listed below.       Follow Up Recommendations Home health PT    Equipment Recommendations  Other (comment) (has DME)    Recommendations for Other Services       Precautions / Restrictions Precautions Precautions: Fall      Mobility  Bed Mobility               General bed mobility comments: in chair at arrival    Transfers Overall transfer level: Needs assistance Equipment used: None Transfers: Sit to/from Stand Sit to Stand: Supervision         General transfer comment: sit to stand x 2; toielting ADLs with supervision for safety and line/lead management  Ambulation/Gait Ambulation/Gait assistance: Min guard Gait Distance (Feet): 100 Feet Assistive device: IV Pole;None Gait Pattern/deviations: Step-through pattern;Decreased stride length Gait velocity: decreased   General Gait Details: Pt demonstrating mild deficits without AD and reaching for furniture; ambulated with IV pole with  mild instability but without overt LOB but provided min guard for safety; reports fatigue and DOE of 2/4;recommend use of RW at d/c  Stairs            Wheelchair Mobility    Modified Rankin (Stroke Patients Only)       Balance Overall balance assessment: Needs assistance   Sitting balance-Leahy Scale: Normal     Standing balance support: No upper extremity supported;During functional activity Standing balance-Leahy Scale: Fair Standing balance comment: Able to peform ADLs and wash hands without support but for transfers and ambulation pt preferring at least single UE support                             Pertinent Vitals/Pain Pain Assessment: No/denies pain    Home Living Family/patient expects to be discharged to:: Private residence Living Arrangements: Alone Available Help at Discharge: Family;Available PRN/intermittently Type of Home: House Home Access: Stairs to enter Entrance Stairs-Rails: Left Entrance Stairs-Number of Steps: 3 Home Layout: One level Home Equipment: Walker - 2 wheels;Cane - single point;Shower seat      Prior Function Level of Independence: Needs assistance   Gait / Transfers Assistance Needed: Ambulates without AD typically ; limited community distance  ADL's / Homemaking Assistance Needed: Reports independent with ADLs and light IADLs        Hand Dominance   Dominant Hand: Right    Extremity/Trunk Assessment   Upper Extremity Assessment Upper Extremity Assessment: Overall WFL for tasks assessed  Lower Extremity Assessment Lower Extremity Assessment: LLE deficits/detail;RLE deficits/detail RLE Deficits / Details: ROM WFL; MMT 5/5 LLE Deficits / Details: ROM WFL; MMT 5/5       Communication   Communication: No difficulties  Cognition Arousal/Alertness: Awake/alert Behavior During Therapy: WFL for tasks assessed/performed Overall Cognitive Status: Within Functional Limits for tasks assessed                                  General Comments: mild deficits in short term memory during session      General Comments General comments (skin integrity, edema, etc.): On RA with O2 sats 91% with activity    Exercises     Assessment/Plan    PT Assessment Patient needs continued PT services  PT Problem List Decreased strength;Decreased mobility;Decreased activity tolerance;Cardiopulmonary status limiting activity;Decreased balance;Decreased knowledge of use of DME       PT Treatment Interventions DME instruction;Therapeutic activities;Gait training;Patient/family education;Therapeutic exercise;Stair training;Balance training;Functional mobility training    PT Goals (Current goals can be found in the Care Plan section)  Acute Rehab PT Goals Patient Stated Goal: return home PT Goal Formulation: With patient Time For Goal Achievement: 05/30/20 Potential to Achieve Goals: Good    Frequency Min 3X/week   Barriers to discharge        Co-evaluation               AM-PAC PT "6 Clicks" Mobility  Outcome Measure Help needed turning from your back to your side while in a flat bed without using bedrails?: None Help needed moving from lying on your back to sitting on the side of a flat bed without using bedrails?: None Help needed moving to and from a bed to a chair (including a wheelchair)?: A Little Help needed standing up from a chair using your arms (e.g., wheelchair or bedside chair)?: None Help needed to walk in hospital room?: A Little Help needed climbing 3-5 steps with a railing? : A Little 6 Click Score: 21    End of Session Equipment Utilized During Treatment: Gait belt Activity Tolerance: Patient tolerated treatment well Patient left: with chair alarm set;in chair;with call bell/phone within reach Nurse Communication: Mobility status PT Visit Diagnosis: Unsteadiness on feet (R26.81);Other abnormalities of gait and mobility (R26.89)    Time: 2694-8546 PT Time  Calculation (min) (ACUTE ONLY): 24 min   Charges:   PT Evaluation $PT Eval Low Complexity: 1 Low PT Treatments $Therapeutic Activity: 8-22 mins        Abran Richard, PT Acute Rehab Services Pager [phone] [facility] [place] 05/16/2020, 1:04 PM

## 2020-05-16 NOTE — Progress Notes (Signed)
Thoracentesis preformed with Dr. Silas Flood. 1300 mLs of fluid taken out from right lung . Patient tolerated procedure well.

## 2020-05-16 NOTE — Plan of Care (Signed)

## 2020-05-16 NOTE — Consult Note (Addendum)
Mary Merritt  [phone] [phone]   MEDICAL ONCOLOGY - INITIAL CONSULTATION  Referral MD: Dr. Talbert Cage  Reason for Referral: Lung mass  HPI: Mary Merritt is a 86 year old female with a past medical history significant for hypertension, hyperlipidemia, AAA status post repair, left hip replacement, right hip osteoarthritis.  The patient was admitted to the hospital with shortness of breath, chest tightness, and productive cough.  The patient had been treated for pneumonia as an outpatient and failed treatment.  On admission, CBC normal except for mild thrombocytosis with a platelet count of 440,000, sodium 118.  Chest x-ray showed volume loss with diffuse airspace disease throughout the right lung and small right pleural effusion with findings concerning for pneumonia.  A CT of the chest with contrast was performed 05/15/2020 which showed a 7.2 x 5.9 cm right upper lobe lung mass with direct invasion of the right hilum and mediastinum with extensive mediastinal and right hilar lymphadenopathy, extensive interstitial spread of tumor in the right lung and large loculated right pleural fluid collection likely secondary to pleural metastatic disease, small scattered pulmonary nodules in the left lung suspicious for metastatic disease, 13 x 10.5 mm spiculated lesion in the lingula which could be a second primary.  Pulmonology has been consulted and plan for thoracentesis this afternoon and will send fluid for cytology.  Mary Merritt was seen in her hospital room.  No family at the bedside.  The patient reports that she was in her usual state of health and was being treated with antibiotics for pneumonia.  She developed worsening dyspnea with exertion on Sunday.  Reported that she was unable to catch her breath to do minimal activity.  She reports that she has had a slow weight loss over the past 3 years of about 30 pounds.  Appetite is fair.  Reports constipation which is  baseline for her. Her daughter brought her to the emergency room for further evaluation.  She denies having any recent fevers, chills, headaches, dizziness, chest pain, cough, hemoptysis, abdominal pain, nausea, vomiting, bleeding.  The patient is widowed and has 3 children-1 deceased.  Her Merritt lives about 3 minutes away from her.  She reports that she is independent around her household including household chores and ADLs.  She continues to drive herself without difficulty.  Denies history of alcohol use but has a 30-pack-year history of smoking.  Quit in 1985. Medical oncology was asked see the patient to make recommendations regarding her lung mass.    Past Medical History:  Diagnosis Date  . AAA (abdominal aortic aneurysm) (HCC)   . Anxiety   . Arthritis   . Dysrhythmia    bradycardia  . History of bronchitis   . Hyperlipidemia   . Hypertension   . Seasonal allergies   :  .  G3 P3  Past Surgical History:  Procedure Laterality Date  . ABDOMINAL AORTIC ANEURYSM REPAIR     20 09, Dr. Scot Dock  . ABDOMINAL AORTOGRAM W/LOWER EXTREMITY Right 04/07/2020   Procedure: ABDOMINAL AORTOGRAM W/LOWER EXTREMITY;  Surgeon: Angelia Mould, MD;  Location: [facility];  Service: Cardiovascular;  Laterality: Right;  . CARDIAC CATHETERIZATION     patient thinks it was in 09 with dr Scot Dock  . cataracts removed    . INCONTINENCE SURGERY    . TOTAL HIP ARTHROPLASTY Right 08/01/2014   Procedure: RIGHT TOTAL HIP ARTHROPLASTY ANTERIOR APPROACH;  Surgeon: Marianna Payment, MD;  Location: [facility];  Service: Orthopedics;  Laterality:  Right;  Marland Kitchen TOTAL HIP ARTHROPLASTY Left 03/27/2016   Procedure: LEFT TOTAL HIP ARTHROPLASTY ANTERIOR APPROACH;  Surgeon: Leandrew Koyanagi, MD;  Location: [facility];  Service: Orthopedics;  Laterality: Left;  :  Current Facility-Administered Medications  Medication Dose Route Frequency Provider Last Rate Last Admin  . amLODipine (NORVASC) tablet 10 mg  10 mg Oral Daily  Gifford Shave, MD   10 mg at 05/15/20 9518  . aspirin EC tablet 81 mg  81 mg Oral Daily Gifford Shave, MD   81 mg at 05/15/20 0811  . ceFEPIme (MAXIPIME) 2 g in sodium chloride 0.9 % 100 mL IVPB  2 g Intravenous Q12H Wilber Oliphant, MD 200 mL/hr at 05/16/20 0541 2 g at 05/16/20 0541  . enoxaparin (LOVENOX) injection 40 mg  40 mg Subcutaneous Q24H Gifford Shave, MD   40 mg at 05/15/20 2117  . escitalopram (LEXAPRO) tablet 10 mg  10 mg Oral Daily Gifford Shave, MD   10 mg at 05/15/20 8416  . loratadine (CLARITIN) tablet 10 mg  10 mg Oral Daily Gifford Shave, MD   10 mg at 05/15/20 0811  . losartan (COZAAR) tablet 50 mg  50 mg Oral Daily Gifford Shave, MD   50 mg at 05/15/20 6063  . melatonin tablet 3 mg  3 mg Oral QHS Maness, Philip, MD   3 mg at 05/15/20 2118  . metoprolol tartrate (LOPRESSOR) tablet 12.5 mg  12.5 mg Oral BID Gifford Shave, MD   12.5 mg at 05/15/20 2118  . pravastatin (PRAVACHOL) tablet 20 mg  20 mg Oral q1800 Gifford Shave, MD   20 mg at 05/15/20 1700     Allergies  Allergen Reactions  . Percocet [Oxycodone-Acetaminophen] Other (See Comments)    Makes head feel funny  :  Family History  Problem Relation Age of Onset  . Heart failure Father   . Stroke Mother   :  Social History   Socioeconomic History  . Marital status: Widowed    Spouse name: Not on file  . Number of children: Not on file  . Years of education: Not on file  . Highest education level: Not on file  Occupational History  . Not on file  Tobacco Use  . Smoking status: Former Smoker    Packs/day: 1.00    Years: 30.00    Pack years: 30.00    Quit date: 05/31/1984    Years since quitting: 35.9  . Smokeless tobacco: Never Used  Vaping Use  . Vaping Use: Never used  Substance and Sexual Activity  . Alcohol use: No    Alcohol/week: 0.0 standard drinks  . Drug use: No  . Sexual activity: Not on file  Other Topics Concern  . Not on file  Social History Narrative  . Not on  file   Social Determinants of Health   Financial Resource Strain:   . Difficulty of Paying Living Expenses: Not on file  Food Insecurity:   . Worried About Charity fundraiser in the Last Year: Not on file  . Ran Out of Food in the Last Year: Not on file  Transportation Needs:   . Lack of Transportation (Medical): Not on file  . Lack of Transportation (Non-Medical): Not on file  Physical Activity:   . Days of Exercise per Week: Not on file  . Minutes of Exercise per Session: Not on file  Stress:   . Feeling of Stress : Not on file  Social Connections:   . Frequency of  Communication with Friends and Family: Not on file  . Frequency of Social Gatherings with Friends and Family: Not on file  . Attends Religious Services: Not on file  . Active Member of Clubs or Organizations: Not on file  . Attends Archivist Meetings: Not on file  . Marital Status: Not on file  Intimate Partner Violence:   . Fear of Current or Ex-Partner: Not on file  . Emotionally Abused: Not on file  . Physically Abused: Not on file  . Sexually Abused: Not on file  :  Review of Systems: A comprehensive 14 point review of systems was negative except as noted in the HPI.  Exam: Patient Vitals for the past 24 hrs:  BP Temp Temp src Pulse Resp SpO2  05/16/20 0444 (!) 144/53 98.1 F (36.7 C) Oral 81 18 92 %  05/15/20 2100 (!) 133/53 97.6 F (36.4 C) Oral 72 16 94 %  05/15/20 1452 131/60 98.3 F (36.8 C) Oral 80 18 92 %  05/15/20 1043 (!) 127/55 98.7 F (37.1 C) Oral 72 18 98 %    General: Elderly female, no distress Eyes: EOMI, PERRL, no scleral icterus.   ENT:  There were no oropharyngeal lesions.    Lymphatics:  Negative cervical, supraclavicular,axillary, or inguinal adenopathy.   Respiratory: Diminished breath sounds right base otherwise clear Cardiovascular:  Regular rate and rhythm, S1/S2, without murmur, rub or gallop.  There was no pedal edema.   GI:  abdomen was soft, flat, nontender,  nondistended, without organomegaly.   Musculoskeletal: Strength symmetrical in the upper and lower extremities. Skin exam was without echymosis, petichae.   Neuro exam was nonfocal. Patient was alert and oriented.  Attention was good.   Language was appropriate.  Mood was normal without depression.  Speech was not pressured.  Thought content was not tangential.     Lab Results  Component Value Date   WBC 4.8 05/15/2020   HGB 11.9 (L) 05/15/2020   HCT 34.1 (L) 05/15/2020   PLT 441 (H) 05/15/2020   GLUCOSE 122 (H) 05/16/2020   ALT 14 05/15/2020   AST 37 05/15/2020   NA 124 (L) 05/16/2020   K 3.8 05/16/2020   CL 91 (L) 05/16/2020   CREATININE 0.71 05/16/2020   BUN 7 (L) 05/16/2020   CO2 24 05/16/2020    CT CHEST W CONTRAST  Result Date: 05/15/2020 CLINICAL DATA:  Evaluate right lung process seen on recent x-ray. EXAM: CT CHEST WITH CONTRAST TECHNIQUE: Multidetector CT imaging of the chest was performed during intravenous contrast administration. CONTRAST:  73mL OMNIPAQUE IOHEXOL 300 MG/ML  SOLN COMPARISON:  Chest x-ray 05/14/2020 FINDINGS: Cardiovascular: The heart is normal in size for age. No pericardial effusion. There is tortuosity, ectasia and calcification of the thoracic aorta. No dissection. Branch vessel calcifications are noted including three-vessel coronary artery calcifications. Mediastinum/Nodes: Extensive mediastinal and right hilar lymphadenopathy. 19 mm upper prevascular node on image 38/3. Left-sided anterior mediastinal nodal mass on image 31/3 measures 3.5 x 2.5 cm. Pretracheal lymph node on image number 50/3 measures 17 mm. Right hilar node on image number 66/3 measures 24 mm. 16 mm subcarinal lymph node on image number 65/3. The esophagus is grossly normal. Lungs/Pleura: There is a large right upper lobe lung mass measuring approximately 7.2 x 5.9 cm with direct invasion of the right hilum and mediastinum. There is significant surrounding interstitial nodularity  consistent with interstitial spread of tumor. This probably also endobronchial spread of tumor. Associated large loculated right pleural fluid collection  likely secondary to pleural metastatic disease. Irregular pleural thickening in some areas is suspicious for tumor. There are also enlarged epicardial lymph nodes on the right side. Underlying emphysematous changes and pulmonary scarring. There are a few small scattered pulmonary nodules in the left lung suspicious for metastatic disease. There is also a somewhat spiculated appearing lesion in the lingula on image number 74/4 which measures 13 x 10.5 mm and could be a metastatic focus or a second primary/synchronous neoplasm. Upper Abdomen: No obvious lesions in the upper half of the liver. The adrenal glands are not covered. Musculoskeletal: No breast masses are identified. Scattered borderline right axillary lymph nodes. The bony thorax is intact. I do not see any definite lytic or sclerotic bone lesions to suggest metastatic disease. IMPRESSION: 1. 7.2 x 5.9 cm right upper lobe lung mass with direct invasion of the right hilum and mediastinum. Extensive mediastinal and right hilar lymphadenopathy. 2. Extensive interstitial spread of tumor in the right lung and large loculated right pleural fluid collection likely secondary to pleural metastatic disease. 3. Small scattered pulmonary nodules in the left lung suspicious for metastatic disease. 13 x 10.5 mm spiculated lesion in the lingula could be a second primary. 4. PET-CT may be helpful for further evaluation and staging purposes, if necessary given the patient's age and clinical status. Aortic Atherosclerosis (ICD10-I70.0) and Emphysema (ICD10-J43.9). Electronically Signed   By: Marijo Sanes M.D.   On: 05/15/2020 13:39   DG Chest Portable 1 View  Result Date: 05/14/2020 CLINICAL DATA:  Cough EXAM: PORTABLE CHEST 1 VIEW COMPARISON:  12/23/2018 FINDINGS: Diffuse airspace opacity throughout the right lung  with volume loss and small right pleural effusion. Findings concerning for pneumonia. Heart is borderline in size. Left basilar scarring or atelectasis. IMPRESSION: Volume loss with diffuse airspace disease throughout the right lung and small right pleural effusion. Findings concerning for pneumonia. Electronically Signed   By: Rolm Baptise M.D.   On: 05/14/2020 17:09     CT CHEST W CONTRAST  Result Date: 05/15/2020 CLINICAL DATA:  Evaluate right lung process seen on recent x-ray. EXAM: CT CHEST WITH CONTRAST TECHNIQUE: Multidetector CT imaging of the chest was performed during intravenous contrast administration. CONTRAST:  42mL OMNIPAQUE IOHEXOL 300 MG/ML  SOLN COMPARISON:  Chest x-ray 05/14/2020 FINDINGS: Cardiovascular: The heart is normal in size for age. No pericardial effusion. There is tortuosity, ectasia and calcification of the thoracic aorta. No dissection. Branch vessel calcifications are noted including three-vessel coronary artery calcifications. Mediastinum/Nodes: Extensive mediastinal and right hilar lymphadenopathy. 19 mm upper prevascular node on image 38/3. Left-sided anterior mediastinal nodal mass on image 31/3 measures 3.5 x 2.5 cm. Pretracheal lymph node on image number 50/3 measures 17 mm. Right hilar node on image number 66/3 measures 24 mm. 16 mm subcarinal lymph node on image number 65/3. The esophagus is grossly normal. Lungs/Pleura: There is a large right upper lobe lung mass measuring approximately 7.2 x 5.9 cm with direct invasion of the right hilum and mediastinum. There is significant surrounding interstitial nodularity consistent with interstitial spread of tumor. This probably also endobronchial spread of tumor. Associated large loculated right pleural fluid collection likely secondary to pleural metastatic disease. Irregular pleural thickening in some areas is suspicious for tumor. There are also enlarged epicardial lymph nodes on the right side. Underlying emphysematous  changes and pulmonary scarring. There are a few small scattered pulmonary nodules in the left lung suspicious for metastatic disease. There is also a somewhat spiculated appearing lesion in the lingula on  image number 74/4 which measures 13 x 10.5 mm and could be a metastatic focus or a second primary/synchronous neoplasm. Upper Abdomen: No obvious lesions in the upper half of the liver. The adrenal glands are not covered. Musculoskeletal: No breast masses are identified. Scattered borderline right axillary lymph nodes. The bony thorax is intact. I do not see any definite lytic or sclerotic bone lesions to suggest metastatic disease. IMPRESSION: 1. 7.2 x 5.9 cm right upper lobe lung mass with direct invasion of the right hilum and mediastinum. Extensive mediastinal and right hilar lymphadenopathy. 2. Extensive interstitial spread of tumor in the right lung and large loculated right pleural fluid collection likely secondary to pleural metastatic disease. 3. Small scattered pulmonary nodules in the left lung suspicious for metastatic disease. 13 x 10.5 mm spiculated lesion in the lingula could be a second primary. 4. PET-CT may be helpful for further evaluation and staging purposes, if necessary given the patient's age and clinical status. Aortic Atherosclerosis (ICD10-I70.0) and Emphysema (ICD10-J43.9). Electronically Signed   By: Marijo Sanes M.D.   On: 05/15/2020 13:39   DG Chest Portable 1 View  Result Date: 05/14/2020 CLINICAL DATA:  Cough EXAM: PORTABLE CHEST 1 VIEW COMPARISON:  12/23/2018 FINDINGS: Diffuse airspace opacity throughout the right lung with volume loss and small right pleural effusion. Findings concerning for pneumonia. Heart is borderline in size. Left basilar scarring or atelectasis. IMPRESSION: Volume loss with diffuse airspace disease throughout the right lung and small right pleural effusion. Findings concerning for pneumonia. Electronically Signed   By: Rolm Baptise M.D.   On:  05/14/2020 17:09   Assessment and Plan:  1.  Right lung mass concerning for malignancy -05/15/2020-CT chest with contrast showed a 7.2 x 5.9 cm right upper lobe lung mass with invasion into the right hilum and mediastinum, extensive mediastinal and right hilar lymphadenopathy, extensive interstitial spread of tumor in the right lung and large loculated right pleural fluid collection likely secondary to pleural metastatic disease, scattered pulmonary nodules in the left lung suspicious for metastatic disease, 13 x 10.5 mm spiculated lesion in the lingula which could be a second primary. 2.  Pleural effusion-likely malignant 3.  Postobstructive pneumonia? 4.  Hyponatremia 5.  Mild normocytic anemia 6.  Thrombocytosis 7.  Hypertension 8.  AAA status post repair in 2009 9.  PAD 10.  Hyperlipidemia 11.  Depression  12.  Anxiety 13.  Hyponatremia  Mary Merritt is a pleasant 86 year old female who was admitted to the hospital for pneumonia.  Failed outpatient treatment for pneumonia.  She had a CT scan of the chest performed which showed a right upper lobe lung mass with invasion into the right hilum, mediastinum, extensive mediastinal and right hilar lymphadenopathy, and extensive spread of the tumor in the right lung.  Additionally, she had a loculated pleural effusion which is likely malignant as well as scattered pulmonary nodules in the left lung.  She is scheduled for thoracentesis later today by pulmonology.  Fluid will be sent for cytology.  We discussed CT scan findings and concern for malignancy.  Suspect this represents a stage IV lung cancer.  We discussed that we need a biopsy to confirm the diagnosis and determine the type of cancer.  We briefly discussed systemic treatment options including chemotherapy and immunotherapy. The patient has expressed that she is unsure if she would want to pursue systemic therapy but is open to hearing her options.  She is concerned about treatment impacting  her quality of life.  The patient  is hopeful to go home soon and agreeable to coming to our office to discuss biopsy results and treatment options.  Recommendations: 1.  Proceed with thoracentesis as scheduled today.  Fluid to be sent for cytology. 2.  We we will arrange for outpatient follow-up with cancer center to discuss her biopsy results.  If she wishes to pursue systemic treatment, she would need a PET scan which will be performed as an outpatient an MRI of the brain with and without contrast which will be performed as an outpatient. 3.  Fluid restriction to 1 L daily, repeat sodium level as an outpatient  Thank you for this referral.   Mary Bussing, DNP, AGPCNP-BC, AOCNP  Mary Merritt was interviewed and examined.  She was admitted with dyspnea and found to have a lung mass and right pleural effusion.  The clinical presentation is consistent with advanced stage lung cancer.  She feels better after the thoracentesis this afternoon.  I discussed the probable diagnosis of lung cancer with Mary Merritt.  We reviewed the chest CT images.  It is possible the pleural fluid cytology will yield a diagnosis.  If not we will need to consider a diagnostic bronchoscopy.  The hyponatremia suggest a possible diagnosis of small cell lung cancer.  Mary Merritt stated she would like to consider treatment of the lung cancer.  She understands treatment will depend on the tumor histology and molecular characteristics.  We discussed comfort/supportive care and systemic therapy treatment options.  Ms. Tessler is scheduled to be discharged later today.  She will follow-up at the cancer center next week.

## 2020-05-16 NOTE — Discharge Summary (Addendum)
[facility] Discharge Summary  Patient name: Mary Merritt Medical record number: [id number] Date of birth: 01/25/1928 Age: 86 y.o. Gender: female Date of Admission: 05/14/2020  Date of Discharge: 05/16/2020 Admitting Physician: Gifford Shave, MD  Primary Care Provider: Katherina Mires, MD Consultants: Pulm  Indication for Hospitalization: Shortness of breath, chest tightness, runny nose, productive cough  Discharge Diagnoses/Problem List:  Lung Mass Hyponatremia HTN HLD PAD Depression, Anxiety Seasonal Allergies  Disposition: Home  Discharge Condition: Stable  Discharge Exam:  General:Breathing comfortably ar room air. Not in acute stress. Cardiovascular:RRR, No M/G/R Respiratory:Wheezing in Rt upper side, Decreased breath sounds and rales in Rt side. Abdomen:Soft, non tender, BS +nt Extremities:Mild Edema Present B/L, Pulses equal and strong.  Brief Hospital Course:  Mary Merritt a 86 y.o.femalepresenting with pneumonia which is failed outpatient management. PMH is significant forhypertension, hyperlipidemia, AAA s/p repair, left hip replacement, right hip osteoarthritis.  Shortness of breath Patient presented with shortness of breath, chest tightness, runny nose, productive cough. Reported that her symptoms have been persistent for approximately the past month.  She has received 2 separate treatments of outpatient antibiotics without resolution with doxycycline started on 11/17.  She reported history of smoking cigarettes every "few days" for 25 years, she quit smoking years ago.  Patient also reported unintentional weight loss of 30 pounds over the past "year or so".  On admission, she was afebrile with WBC of 8.6.  Chest x-ray concerning for pneumonia with volume loss and diffuse airspace disease throughout the right lung, small right pleural effusion. Troponins were negative x2.  Patient was started on cefepime and oxygen. CT chest  was done which showed 7.2 x 5.9 cm right upper lobe lung mass with direct invasion of the right hilum and mediastinum. Extensive mediastinal and right hilar lymphadenopathy. Extensive interstitial spread of tumor in the right lung and large loculated right pleural fluid collection likely secondary to pleural metastatic disease. Small scattered pulmonary nodules in the left lung suspicious for metastatic disease. 13 x 10.5 mm spiculated lesion in the lingula.  Pulmonology and oncology were consulted recommended  thoracentesis for pleural effusion + pleural fluid studies. Oncology recommends tissue diagnosis.  Antibiotics were stopped.  Thoracentesis was done and 1300 ml of Fluid removed from Rt Lung. Pt tolerated the procedure well. Recommend follow-up with outpatient oncology for further discussion regarding additional testing, biopsies if needed, therapies if desired.  Hyponatremia  hypochloremia Pt Sodium was118 upon admission, chloride 88.  Oral intake fluids were restricted to 1500 mL a day. Urine was collected and sent for urine sodium and urine osmolality to Rule out SIADH. Results pending. Na on Discharge is 124 and Cl is 91.  Issues for Follow Up: 1. Free water restriction of less than 1000 mL a day. 2. F/U with Pulmonology 3. F/U with Oncology for additional testing, biopsies and therapies. 4.  Patient needs follow-up BMP in approximately 1 week to evaluate hyponatremia  Significant Procedures: Thoracocentesis done on 05/16/20  Significant Labs and Imaging:  Recent Labs  Lab 05/14/20 1548 05/15/20 0253  WBC 8.6 4.8  HGB 12.7 11.9*  HCT 37.8 34.1*  PLT 440* 441*   Recent Labs  Lab 05/14/20 1548 05/14/20 1548 05/15/20 0253 05/16/20 0559  NA 118*  --  120* 124*  K 4.4   < > 3.9 3.8  CL 88*  --  87* 91*  CO2 18*  --  19* 24  GLUCOSE 120*  --  183* 122*  BUN 7*  --  8 7*  CREATININE 0.74  --  0.89 0.71  CALCIUM 9.0  --  9.1 8.9  MG  --   --  1.2* 1.8  ALKPHOS  --   --  61   --   AST  --   --  37  --   ALT  --   --  14  --   ALBUMIN  --   --  2.7*  --    < > = values in this interval not displayed.      Results/Tests Pending at Time of Discharge: Urine sodium and urine osmolality. Pleural fluid studies. Discharge Medications:  Allergies as of 05/16/2020      Reactions   Percocet [oxycodone-acetaminophen] Other (See Comments)   Makes head feel funny      Medication List    STOP taking these medications   amoxicillin-clavulanate 1000-62.5 MG 12 hr tablet Commonly known as: AUGMENTIN XR   doxycycline 100 MG tablet Commonly known as: VIBRA-TABS   Turmeric 500 MG Tabs     TAKE these medications   acetaminophen 650 MG CR tablet Commonly known as: TYLENOL Take 650 mg by mouth daily as needed for pain (headache).   albuterol 108 (90 Base) MCG/ACT inhaler Commonly known as: VENTOLIN HFA Inhale 2 puffs into the lungs every 4 (four) hours as needed for wheezing or shortness of breath.   amLODipine 10 MG tablet Commonly known as: NORVASC Take 1 tablet by mouth once daily   aspirin EC 81 MG tablet Take 81 mg by mouth daily.   cetirizine 10 MG tablet Commonly known as: ZYRTEC Take 10 mg by mouth at bedtime.   escitalopram 10 MG tablet Commonly known as: LEXAPRO Take 10 mg by mouth daily.   Fish Oil 1000 MG Caps Take 2,000 mg by mouth at bedtime.   ketotifen 0.025 % ophthalmic solution Commonly known as: ZADITOR Place 1 drop into both eyes daily.   levocetirizine 5 MG tablet Commonly known as: XYZAL Take 5 mg by mouth at bedtime.   losartan 50 MG tablet Commonly known as: COZAAR TAKE 1 TABLET BY MOUTH ONCE DAILY What changed: when to take this   lovastatin 20 MG tablet Commonly known as: MEVACOR TAKE 1 TABLET BY MOUTH AT BEDTIME   Magnesium 250 MG Tabs Take 250 mg by mouth every other day.   melatonin 3 MG Tabs tablet Take 1 tablet (3 mg total) by mouth at bedtime.   metoprolol tartrate 25 MG tablet Commonly known as:  LOPRESSOR Take 0.5 tablets (12.5 mg total) by mouth 2 (two) times daily.   VITAMIN D3 PO Take 1 tablet by mouth at bedtime.       Discharge Instructions: Please refer to Patient Instructions section of EMR for full details.  Patient was counseled important signs and symptoms that should prompt return to medical care, changes in medications, dietary instructions, activity restrictions, and follow up appointments.   Follow-Up Appointments:  Follow-up Information    [facility] Follow up.   Specialty: Home Health Services Contact information: [address] [place] [address] [phone]               Armando Reichert, MD 05/16/2020, 4:15 PM PGY-1, [place]

## 2020-05-16 NOTE — Op Note (Signed)
Thoracentesis  Procedure Note  Mary Merritt  [id number]  02/20/1928  Date:05/16/20  Time:2:35 PM   Provider Performing:Raekwon Winkowski R Bradden Tadros   Procedure: Thoracentesis with imaging guidance (58832)  Indication(s) Pleural Effusion  Consent Risks of the procedure as well as the alternatives and risks of each were explained to the patient and/or caregiver.  Consent for the procedure was obtained and is signed in the bedside chart  Anesthesia Topical only with 1% lidocaine    Time Out Verified patient identification, verified procedure, site/side was marked, verified correct patient position, special equipment/implants available, medications/allergies/relevant history reviewed, required imaging and test results available.   Sterile Technique Maximal sterile technique including full sterile barrier drape, hand hygiene,  sterile gloves, mask, hair covering, sterile ultrasound probe cover (if used).  Procedure Description Ultrasound was used to identify appropriate pleural anatomy for placement and overlying skin marked.  Area of drainage cleaned and draped in sterile fashion. Lidocaine was used to anesthetize the skin and subcutaneous tissue (8 cc 1% w/o epi).  1300 cc's of serous appearing fluid was drained from the right pleural space. Procedure terminated due to right sided chest pressure. Catheter then removed and bandaid applied to site.   Complications/Tolerance None; patient tolerated the procedure well.    EBL none   Specimen(s) Pleural fluid

## 2020-05-16 NOTE — Consult Note (Addendum)
NAME:  Mary Merritt, MRN:  [id number], DOB:  08/22/1927, LOS: 1 ADMISSION DATE:  05/14/2020, CONSULTATION DATE:  05/16/20 REFERRING MD:  Lind Covert, MD, CHIEF COMPLAINT:  DOE   Brief History   86 year old admitted after a few days of worsening dyspnea on exertion and allergy-like symptoms diagnosed with pneumonia who failed outpatient therapy with oral antibiotics admitted for presumed pneumonia with CT imaging most consistent with primary lung cancer with evidence of distant metastases to pleura as well as invasion of mediastinum whom were consulted for evaluation of the same.  History of present illness   She was usual state of health initially on her report.  About a week ago developed bad allergy symptoms sounds like congestion a bit of shortness of breath.  Trialed allergy therapies per PCP without improvement.  This prompted chest x-ray given worsening shortness of breath about 4 to 5 days prior to presentation.  She was diagnosed with pneumonia and given an oral antibiotic, appears like Augmentin but she is unsure.  Despite taking this for about 4 days her breathing worsened.  This prompted presentation to the ED.  Chest x-ray showed right mid upper lung field opacity with pleural effusion.  Initial concern for pneumonia so started on antibiotics via IV.  CT scan was obtained which demonstrated a large right upper lobe mass with evidence of metastases and pleural effusion which prompted this consultation.  Over last couple days, she thinks her breathing is a bit better.  Seem like she was initially on a couple liters of oxygen now on room air.  She feels her breathing is slowly improved.  Discussed at length the news of the CT scan which she understood well on my questioning.  Discussed the rationale for additional testing including a thoracentesis mainly for symptomatically relief but possible diagnosis.  Discussed her wishes in the event of the likely diagnosis of cancer.  She she  reports her most likely not want treatment and focus on her quality.  Discussed with her that that would be an option to mainly focus on symptoms as opposed to cancer directed care.  She has been to think about this and discuss with her family.  Offered to perform thoracentesis or defer this indefinitely if she wished not to pursue diagnosis or treatment.  She endorsed desire to proceed with thoracentesis.  Past Medical History  Seasonal allergies, possible asthma, hypertension  Significant Hospital Events   Admitted 11/21  Consults:  Oncology PCCM  Procedures:  Plan thoracentesis 11/23  Significant Diagnostic Tests:  Thoracentesis studies to be sent 11/23 PM CT chest 11/22 on my interpretation with large right upper lobe lung mass with evidence of some interlobular septal thickening concerning for lymphocytic spread, appears to be invading mediastinum, mediastinal lymphadenopathy present, thickening of pleura with pleural effusion concerning for metastases  Micro Data:  Pending  Antimicrobials:  Cefepime  Interim history/subjective:  As above  Objective   Blood pressure (!) 144/53, pulse 81, temperature 98.1 F (36.7 C), temperature source Oral, resp. rate 18, SpO2 92 %.        Intake/Output Summary (Last 24 hours) at 05/16/2020 0933 Last data filed at 05/16/2020 0237 Gross per 24 hour  Intake 820 ml  Output 900 ml  Net -80 ml   There were no vitals filed for this visit.  Examination: General: Elderly, sitting in bed Eyes: EOMI, no icterus Neck: Supple, no JVP appreciated Respiratory: Clear on left, diminished in the right base, inspiratory expiratory high-pitched sounds on  the right upper and midlung fields Cardiovascular: Regular rhythm, no murmurs Abdomen: Soft, nontender, nondistended Extremities: Warm, no edema Neuro: No weakness, sensation intact Psych: Normal mood, full affect MSK: No synovitis, joint effusion  Resolved Hospital Problem list    N/A  Assessment & Plan:  Large right lung mass with evidence of metastases to pleura/malignant pleural effusion and possible invasion of mediastinal structures, evidence of lymphangitic spread: Most likely stage IV primary lung cancer.  Primary risk factor being 30-35-pack-year smoking history.  Discussed at length with patient this likely diagnosis.  Discussed role of thoracentesis for both symptomatic leaf as well as providing diagnosis.  Plan to pursue thoracentesis this afternoon.  Fortunately, she is on room air, she feels a bit better after antibiotics.  Possible contribution of postobstructive pneumonia given concern for endobronchial disease and evidence of inspiratory and expiratory wheezing on exam concern for obstruction most likely from neoplasm. --Thoracentesis afternoon scheduled for 2 PM, will send diagnostic studies including cytology, will request the old fluid for further testing (immunostains, mutation panels) if enough cells are available --Recommend follow-up with outpatient oncology for further discussion regarding additional testing, biopsies if needed, therapies if desired --Suspect would benefit from outpatient PET scan as well as MRI brain given evidence of distal metastasis in size of lung lesion for completion of imaging staging  We will sign off after procedure. Will follow results of pleural studies via EMR.  Best practice:  Per primary  Labs   CBC: Recent Labs  Lab 05/14/20 1548 05/15/20 0253  WBC 8.6 4.8  HGB 12.7 11.9*  HCT 37.8 34.1*  MCV 86.3 84.4  PLT 440* 441*    Basic Metabolic Panel: Recent Labs  Lab 05/14/20 1548 05/15/20 0253 05/16/20 0559  NA 118* 120* 124*  K 4.4 3.9 3.8  CL 88* 87* 91*  CO2 18* 19* 24  GLUCOSE 120* 183* 122*  BUN 7* 8 7*  CREATININE 0.74 0.89 0.71  CALCIUM 9.0 9.1 8.9  MG  --  1.2* 1.8   GFR: CrCl cannot be calculated (Unknown ideal weight.). Recent Labs  Lab 05/14/20 1548 05/15/20 0253  WBC 8.6 4.8     Liver Function Tests: Recent Labs  Lab 05/15/20 0253  AST 37  ALT 14  ALKPHOS 61  BILITOT 0.7  PROT 5.6*  ALBUMIN 2.7*   No results for input(s): LIPASE, AMYLASE in the last 168 hours. No results for input(s): AMMONIA in the last 168 hours.  ABG    Component Value Date/Time   PHART 7.443 (H) 09/01/2008 0341   PCO2ART 33.9 (L) 09/01/2008 0341   PO2ART 65.0 (L) 09/01/2008 0341   HCO3 23.2 09/01/2008 0341   TCO2 26 04/07/2020 1013   O2SAT 93.0 09/01/2008 0341     Coagulation Profile: No results for input(s): INR, PROTIME in the last 168 hours.  Cardiac Enzymes: No results for input(s): CKTOTAL, CKMB, CKMBINDEX, TROPONINI in the last 168 hours.  HbA1C: No results found for: HGBA1C  CBG: No results for input(s): GLUCAP in the last 168 hours.  Review of Systems:   No chest pain with exertion. No orthopnea of PND. Positive for cough, non productive. No headache, vision changes, gait disturbances. Comprehensive review of systems otherwise negative.  Past Medical History  She,  has a past medical history of AAA (abdominal aortic aneurysm) ([place]), Anxiety, Arthritis, Dysrhythmia, History of bronchitis, Hyperlipidemia, Hypertension, and Seasonal allergies.   Surgical History    Past Surgical History:  Procedure Laterality Date  . ABDOMINAL AORTIC ANEURYSM  REPAIR     2009, Dr. Scot Dock  . ABDOMINAL AORTOGRAM W/LOWER EXTREMITY Right 04/07/2020   Procedure: ABDOMINAL AORTOGRAM W/LOWER EXTREMITY;  Surgeon: Angelia Mould, MD;  Location: [facility];  Service: Cardiovascular;  Laterality: Right;  . CARDIAC CATHETERIZATION     patient thinks it was in 09 with dr Scot Dock  . cataracts removed    . INCONTINENCE SURGERY    . TOTAL HIP ARTHROPLASTY Right 08/01/2014   Procedure: RIGHT TOTAL HIP ARTHROPLASTY ANTERIOR APPROACH;  Surgeon: Marianna Payment, MD;  Location: [facility];  Service: Orthopedics;  Laterality: Right;  . TOTAL HIP ARTHROPLASTY Left 03/27/2016    Procedure: LEFT TOTAL HIP ARTHROPLASTY ANTERIOR APPROACH;  Surgeon: Leandrew Koyanagi, MD;  Location: [facility];  Service: Orthopedics;  Laterality: Left;     Social History   reports that she quit smoking about 35 years ago. She has a 30.00 pack-year smoking history. She has never used smokeless tobacco. She reports that she does not drink alcohol and does not use drugs.   Family History   Her family history includes Heart failure in her father; Stroke in her mother.   Allergies Allergies  Allergen Reactions  . Percocet [Oxycodone-Acetaminophen] Other (See Comments)    Makes head feel funny     Home Medications  Prior to Admission medications   Medication Sig Start Date End Date Taking? Authorizing Provider  acetaminophen (TYLENOL) 650 MG CR tablet Take 650 mg by mouth daily as needed for pain (headache).   Yes [provider]  albuterol (VENTOLIN HFA) 108 (90 Base) MCG/ACT inhaler Inhale 2 puffs into the lungs every 4 (four) hours as needed for wheezing or shortness of breath.   Yes [provider]  amLODipine (NORVASC) 10 MG tablet Take 1 tablet by mouth once daily Patient taking differently: Take 10 mg by mouth daily.  02/29/20  Yes Hilty, Nadean Corwin, MD  amoxicillin-clavulanate (AUGMENTIN XR) 1000-62.5 MG 12 hr tablet Take 2 tablets by mouth 2 (two) times daily.   Yes [provider]  aspirin EC 81 MG tablet Take 81 mg by mouth daily.   Yes [provider]  Cholecalciferol (VITAMIN D3 PO) Take 1 tablet by mouth at bedtime.   Yes [provider]  doxycycline (VIBRA-TABS) 100 MG tablet Take 100 mg by mouth 2 (two) times daily.   Yes [provider]  escitalopram (LEXAPRO) 10 MG tablet Take 10 mg by mouth daily.   Yes [provider]  ketotifen (ZADITOR) 0.025 % ophthalmic solution Place 1 drop into both eyes daily.   Yes [provider]  levocetirizine (XYZAL) 5 MG tablet Take 5 mg by mouth at bedtime.   Yes [provider]  losartan (COZAAR) 50 MG tablet TAKE 1 TABLET BY MOUTH ONCE DAILY Patient taking differently: Take 50 mg by mouth at bedtime.  07/24/18  Yes Hilty, Nadean Corwin, MD  lovastatin (MEVACOR) 20 MG tablet TAKE 1 TABLET BY MOUTH AT BEDTIME Patient taking differently: Take 20 mg by mouth at bedtime.  08/18/19  Yes Hilty, Nadean Corwin, MD  Magnesium 250 MG TABS Take 250 mg by mouth every other day.   Yes [provider]  metoprolol tartrate (LOPRESSOR) 25 MG tablet Take 0.5 tablets (12.5 mg total) by mouth 2 (two) times daily. 12/27/19  Yes Hilty, Nadean Corwin, MD  Omega-3 Fatty Acids (FISH OIL) 1000 MG CAPS Take 2,000 mg by mouth at bedtime.    Yes [provider]  Turmeric 500 MG  TABS Take 500 mg by mouth at bedtime.    Yes [provider]  cetirizine (ZYRTEC) 10 MG tablet Take 10 mg by mouth at bedtime.  Patient not taking: Reported on 05/14/2020    [provider]     Critical care time: n/a

## 2020-05-16 NOTE — Interval H&P Note (Signed)
History and Physical Interval Note:  05/16/2020 1:48 PM  Mary Merritt  has presented today for surgery, with the diagnosis of pleural effusion.  The various methods of treatment have been discussed with the patient and family. After consideration of risks, benefits and other options for treatment, the patient has consented to  Procedure(s): THORACENTESIS (N/A) as a surgical intervention.  The patient's history has been reviewed, patient examined, no change in status, stable for surgery.  I have reviewed the patient's chart and labs.  Questions were answered to the patient's satisfaction.     Bonna Gains Tinsley Lomas

## 2020-05-16 NOTE — H&P (View-Only) (Signed)
NAME:  Mary Merritt, MRN:  [id number], DOB:  02-12-1928, LOS: 1 ADMISSION DATE:  05/14/2020, CONSULTATION DATE:  05/16/20 REFERRING MD:  Lind Covert, MD, CHIEF COMPLAINT:  DOE   Brief History   86 year old admitted after a few days of worsening dyspnea on exertion and allergy-like symptoms diagnosed with pneumonia who failed outpatient therapy with oral antibiotics admitted for presumed pneumonia with CT imaging most consistent with primary lung cancer with evidence of distant metastases to pleura as well as invasion of mediastinum whom were consulted for evaluation of the same.  History of present illness   She was usual state of health initially on her report.  About a week ago developed bad allergy symptoms sounds like congestion a bit of shortness of breath.  Trialed allergy therapies per PCP without improvement.  This prompted chest x-ray given worsening shortness of breath about 4 to 5 days prior to presentation.  She was diagnosed with pneumonia and given an oral antibiotic, appears like Augmentin but she is unsure.  Despite taking this for about 4 days her breathing worsened.  This prompted presentation to the ED.  Chest x-ray showed right mid upper lung field opacity with pleural effusion.  Initial concern for pneumonia so started on antibiotics via IV.  CT scan was obtained which demonstrated a large right upper lobe mass with evidence of metastases and pleural effusion which prompted this consultation.  Over last couple days, she thinks her breathing is a bit better.  Seem like she was initially on a couple liters of oxygen now on room air.  She feels her breathing is slowly improved.  Discussed at length the news of the CT scan which she understood well on my questioning.  Discussed the rationale for additional testing including a thoracentesis mainly for symptomatically relief but possible diagnosis.  Discussed her wishes in the event of the likely diagnosis of cancer.  She she  reports her most likely not want treatment and focus on her quality.  Discussed with her that that would be an option to mainly focus on symptoms as opposed to cancer directed care.  She has been to think about this and discuss with her family.  Offered to perform thoracentesis or defer this indefinitely if she wished not to pursue diagnosis or treatment.  She endorsed desire to proceed with thoracentesis.  Past Medical History  Seasonal allergies, possible asthma, hypertension  Significant Hospital Events   Admitted 11/21  Consults:  Oncology PCCM  Procedures:  Plan thoracentesis 11/23  Significant Diagnostic Tests:  Thoracentesis studies to be sent 11/23 PM CT chest 11/22 on my interpretation with large right upper lobe lung mass with evidence of some interlobular septal thickening concerning for lymphocytic spread, appears to be invading mediastinum, mediastinal lymphadenopathy present, thickening of pleura with pleural effusion concerning for metastases  Micro Data:  Pending  Antimicrobials:  Cefepime  Interim history/subjective:  As above  Objective   Blood pressure (!) 144/53, pulse 81, temperature 98.1 F (36.7 C), temperature source Oral, resp. rate 18, SpO2 92 %.        Intake/Output Summary (Last 24 hours) at 05/16/2020 0933 Last data filed at 05/16/2020 0237 Gross per 24 hour  Intake 820 ml  Output 900 ml  Net -80 ml   There were no vitals filed for this visit.  Examination: General: Elderly, sitting in bed Eyes: EOMI, no icterus Neck: Supple, no JVP appreciated Respiratory: Clear on left, diminished in the right base, inspiratory expiratory high-pitched sounds on  the right upper and midlung fields Cardiovascular: Regular rhythm, no murmurs Abdomen: Soft, nontender, nondistended Extremities: Warm, no edema Neuro: No weakness, sensation intact Psych: Normal mood, full affect MSK: No synovitis, joint effusion  Resolved Hospital Problem list    N/A  Assessment & Plan:  Large right lung mass with evidence of metastases to pleura/malignant pleural effusion and possible invasion of mediastinal structures, evidence of lymphangitic spread: Most likely stage IV primary lung cancer.  Primary risk factor being 30-35-pack-year smoking history.  Discussed at length with patient this likely diagnosis.  Discussed role of thoracentesis for both symptomatic leaf as well as providing diagnosis.  Plan to pursue thoracentesis this afternoon.  Fortunately, she is on room air, she feels a bit better after antibiotics.  Possible contribution of postobstructive pneumonia given concern for endobronchial disease and evidence of inspiratory and expiratory wheezing on exam concern for obstruction most likely from neoplasm. --Thoracentesis afternoon scheduled for 2 PM, will send diagnostic studies including cytology, will request the old fluid for further testing (immunostains, mutation panels) if enough cells are available --Recommend follow-up with outpatient oncology for further discussion regarding additional testing, biopsies if needed, therapies if desired --Suspect would benefit from outpatient PET scan as well as MRI brain given evidence of distal metastasis in size of lung lesion for completion of imaging staging  We will sign off after procedure. Will follow results of pleural studies via EMR.  Best practice:  Per primary  Labs   CBC: Recent Labs  Lab 05/14/20 1548 05/15/20 0253  WBC 8.6 4.8  HGB 12.7 11.9*  HCT 37.8 34.1*  MCV 86.3 84.4  PLT 440* 441*    Basic Metabolic Panel: Recent Labs  Lab 05/14/20 1548 05/15/20 0253 05/16/20 0559  NA 118* 120* 124*  K 4.4 3.9 3.8  CL 88* 87* 91*  CO2 18* 19* 24  GLUCOSE 120* 183* 122*  BUN 7* 8 7*  CREATININE 0.74 0.89 0.71  CALCIUM 9.0 9.1 8.9  MG  --  1.2* 1.8   GFR: CrCl cannot be calculated (Unknown ideal weight.). Recent Labs  Lab 05/14/20 1548 05/15/20 0253  WBC 8.6 4.8     Liver Function Tests: Recent Labs  Lab 05/15/20 0253  AST 37  ALT 14  ALKPHOS 61  BILITOT 0.7  PROT 5.6*  ALBUMIN 2.7*   No results for input(s): LIPASE, AMYLASE in the last 168 hours. No results for input(s): AMMONIA in the last 168 hours.  ABG    Component Value Date/Time   PHART 7.443 (H) 09/01/2008 0341   PCO2ART 33.9 (L) 09/01/2008 0341   PO2ART 65.0 (L) 09/01/2008 0341   HCO3 23.2 09/01/2008 0341   TCO2 26 04/07/2020 1013   O2SAT 93.0 09/01/2008 0341     Coagulation Profile: No results for input(s): INR, PROTIME in the last 168 hours.  Cardiac Enzymes: No results for input(s): CKTOTAL, CKMB, CKMBINDEX, TROPONINI in the last 168 hours.  HbA1C: No results found for: HGBA1C  CBG: No results for input(s): GLUCAP in the last 168 hours.  Review of Systems:   No chest pain with exertion. No orthopnea of PND. Positive for cough, non productive. No headache, vision changes, gait disturbances. Comprehensive review of systems otherwise negative.  Past Medical History  She,  has a past medical history of AAA (abdominal aortic aneurysm) ([place]), Anxiety, Arthritis, Dysrhythmia, History of bronchitis, Hyperlipidemia, Hypertension, and Seasonal allergies.   Surgical History    Past Surgical History:  Procedure Laterality Date  . ABDOMINAL AORTIC ANEURYSM  REPAIR     2009, Dr. Scot Dock  . ABDOMINAL AORTOGRAM W/LOWER EXTREMITY Right 04/07/2020   Procedure: ABDOMINAL AORTOGRAM W/LOWER EXTREMITY;  Surgeon: Angelia Mould, MD;  Location: [facility];  Service: Cardiovascular;  Laterality: Right;  . CARDIAC CATHETERIZATION     patient thinks it was in 09 with dr Scot Dock  . cataracts removed    . INCONTINENCE SURGERY    . TOTAL HIP ARTHROPLASTY Right 08/01/2014   Procedure: RIGHT TOTAL HIP ARTHROPLASTY ANTERIOR APPROACH;  Surgeon: Marianna Payment, MD;  Location: [facility];  Service: Orthopedics;  Laterality: Right;  . TOTAL HIP ARTHROPLASTY Left 03/27/2016    Procedure: LEFT TOTAL HIP ARTHROPLASTY ANTERIOR APPROACH;  Surgeon: Leandrew Koyanagi, MD;  Location: [facility];  Service: Orthopedics;  Laterality: Left;     Social History   reports that she quit smoking about 35 years ago. She has a 30.00 pack-year smoking history. She has never used smokeless tobacco. She reports that she does not drink alcohol and does not use drugs.   Family History   Her family history includes Heart failure in her father; Stroke in her mother.   Allergies Allergies  Allergen Reactions  . Percocet [Oxycodone-Acetaminophen] Other (See Comments)    Makes head feel funny     Home Medications  Prior to Admission medications   Medication Sig Start Date End Date Taking? Authorizing Provider  acetaminophen (TYLENOL) 650 MG CR tablet Take 650 mg by mouth daily as needed for pain (headache).   Yes [provider]  albuterol (VENTOLIN HFA) 108 (90 Base) MCG/ACT inhaler Inhale 2 puffs into the lungs every 4 (four) hours as needed for wheezing or shortness of breath.   Yes [provider]  amLODipine (NORVASC) 10 MG tablet Take 1 tablet by mouth once daily Patient taking differently: Take 10 mg by mouth daily.  02/29/20  Yes Hilty, Nadean Corwin, MD  amoxicillin-clavulanate (AUGMENTIN XR) 1000-62.5 MG 12 hr tablet Take 2 tablets by mouth 2 (two) times daily.   Yes [provider]  aspirin EC 81 MG tablet Take 81 mg by mouth daily.   Yes [provider]  Cholecalciferol (VITAMIN D3 PO) Take 1 tablet by mouth at bedtime.   Yes [provider]  doxycycline (VIBRA-TABS) 100 MG tablet Take 100 mg by mouth 2 (two) times daily.   Yes [provider]  escitalopram (LEXAPRO) 10 MG tablet Take 10 mg by mouth daily.   Yes [provider]  ketotifen (ZADITOR) 0.025 % ophthalmic solution Place 1 drop into both eyes daily.   Yes [provider]  levocetirizine (XYZAL) 5 MG tablet Take 5 mg by mouth at bedtime.   Yes [provider]  losartan (COZAAR) 50 MG tablet TAKE 1 TABLET BY MOUTH ONCE DAILY Patient taking differently: Take 50 mg by mouth at bedtime.  07/24/18  Yes Hilty, Nadean Corwin, MD  lovastatin (MEVACOR) 20 MG tablet TAKE 1 TABLET BY MOUTH AT BEDTIME Patient taking differently: Take 20 mg by mouth at bedtime.  08/18/19  Yes Hilty, Nadean Corwin, MD  Magnesium 250 MG TABS Take 250 mg by mouth every other day.   Yes [provider]  metoprolol tartrate (LOPRESSOR) 25 MG tablet Take 0.5 tablets (12.5 mg total) by mouth 2 (two) times daily. 12/27/19  Yes Hilty, Nadean Corwin, MD  Omega-3 Fatty Acids (FISH OIL) 1000 MG CAPS Take 2,000 mg by mouth at bedtime.    Yes [provider]  Turmeric 500 MG  TABS Take 500 mg by mouth at bedtime.    Yes [provider]  cetirizine (ZYRTEC) 10 MG tablet Take 10 mg by mouth at bedtime.  Patient not taking: Reported on 05/14/2020    [provider]     Critical care time: n/a

## 2020-05-16 NOTE — TOC Initial Note (Addendum)
Transition of Care [facility]) - Initial/Assessment Note    Patient Details  Name: Mary Merritt MRN: [id number] Date of Birth: 1928-06-17  [facility]) CM/SW Contact:    Marilu Favre, RN Phone Number: 05/16/2020, 3:44 PM  Clinical Narrative:                  Spoke to patient and son Mary Merritt at bedside. Confirmed face sheet information.   Patient lives by herself has 3 in 1 and walker. Son lives 2 minutes away. Daughter available also . Discussed HHPT/OT patient agreeable but does not want to start until next week. No preference. [facility] Ide) accepted referral  Will need MD orders (spoke to MD) Expected Discharge Plan: [place] Barriers to Discharge: No Barriers Identified   Patient Goals and CMS Choice Patient states their goals for this hospitalization and ongoing recovery are:: to return to home CMS Medicare.gov Compare Post Acute Care list provided to:: Patient Choice offered to / list presented to : Patient, Adult Children  Expected Discharge Plan and Services Expected Discharge Plan: [place]   Discharge Planning Services: CM Consult Post Acute Care Choice: [place] arrangements for the past 2 months: Single Family Home                 DME Arranged: N/A         HH Arranged: PT, OT HH Agency: [place] Date [facility] Agency Contacted: 05/16/20 Time HH Agency Contacted: [phone] Representative spoke with at [place]: Tommi Rumps  Prior Living Arrangements/Services Living arrangements for the past 2 months: [place] Lives with:: Self Patient language and need for interpreter reviewed:: Yes Do you feel safe going back to the place where you live?: Yes      Need for Family Participation in Patient Care: Yes (Comment) Care giver support system in place?: Yes (comment) Current home services: DME Criminal Activity/Legal Involvement Pertinent to Current Situation/Hospitalization: No - Comment as  needed  Activities of Daily Living      Permission Sought/Granted   Permission granted to share information with : Yes, Verbal Permission Granted  Share Information with NAME: Mary Merritt son           Emotional Assessment Appearance:: Appears stated age Attitude/Demeanor/Rapport: Engaged Affect (typically observed): Accepting Orientation: : Oriented to Self, Oriented to Place, Oriented to  Time, Oriented to Situation Alcohol / Substance Use: Not Applicable Psych Involvement: No (comment)  Admission diagnosis:  Pneumonia [J18.9] Abnormal chest x-ray [R93.89] Patient Active Problem List   Diagnosis Date Noted  . Abnormal chest x-ray   . Pneumonia 05/14/2020  . Pain in right foot 11/17/2019  . Hyponatremia 11/22/2017  . Nondisplaced fracture of left tibial spine, subsequent encounter for closed fracture with routine healing 08/05/2017  . Chronic pain of left knee 07/21/2017  . Presence of left artificial hip joint 05/30/2016  . Hip joint replacement status 03/27/2016  . S/P hip replacement 07/27/2015  . Osteoarthritis of right hip 08/01/2014  . Essential hypertension 05/31/2014  . Mixed hyperlipidemia 05/31/2014  . S/P AAA repair 05/31/2014  . Bradycardia 05/31/2014  . Murmur 05/31/2014   PCP:  Katherina Mires, MD Pharmacy:   [facility] [address] [place]), [place] - [address] [place] [address] Phone: [phone] Fax: [phone]     Social Determinants of Health (SDOH) Interventions    Readmission Risk Interventions No flowsheet data found.

## 2020-05-16 NOTE — Evaluation (Signed)
Occupational Therapy Evaluation Patient Details Name: Mary Merritt MRN: [id number] DOB: 1927-06-27 Today's Date: 05/16/2020    History of Present Illness Pt is 86 yo female admitted with worsening DOE.  She was admitted with PNE and lung mass. Pt scheduled for thoracentesis later today.   PMH is significant for HTN, hyperlipidemia, AAA s/p repair, L THA, and R hip OA.   Clinical Impression   PTA, pt was living at home alone with family to assist prn, pt reports she was independent with ADL/IADL and functional mobility. Pt reports 1 fall at mailbox, requiring assistance from her son to help her get up. Pt currently requires supervision to minguard assistance for functional mobility during ADL completion. She demonstrates cognitive functioning within normal limits. Pt demonstrates decreased activity tolerance and mild instability. Due to decline in current level of function, pt would benefit from acute OT to address established goals to facilitate safe D/C to venue listed below. At this time, recommend HHOT follow-up. Will continue to follow acutely.     Follow Up Recommendations  Home health OT;Supervision - Intermittent    Equipment Recommendations  None recommended by OT    Recommendations for Other Services       Precautions / Restrictions Precautions Precautions: Fall Restrictions Weight Bearing Restrictions: No      Mobility Bed Mobility               General bed mobility comments: in chair at arrival    Transfers Overall transfer level: Needs assistance Equipment used: None Transfers: Sit to/from Stand Sit to Stand: Supervision         General transfer comment: sit<>stand with supervision, line management    Balance Overall balance assessment: Needs assistance Sitting-balance support: No upper extremity supported;Feet supported Sitting balance-Leahy Scale: Normal     Standing balance support: No upper extremity supported;During functional  activity Standing balance-Leahy Scale: Fair Standing balance comment: brushing hair at sink, no LOB able to multitask while at sink                           ADL either performed or assessed with clinical judgement   ADL Overall ADL's : Needs assistance/impaired Eating/Feeding: NPO   Grooming: Standing;Supervision/safety   Upper Body Bathing: Set up;Sitting   Lower Body Bathing: Sit to/from stand;Supervison/ safety   Upper Body Dressing : Set up;Sitting   Lower Body Dressing: Supervision/safety;Sit to/from stand   Toilet Transfer: Min guard;Ambulation Toilet Transfer Details (indicate cue type and reason): pt pushing IV pole into bathroom, mild instability noted, no LOB Toileting- Clothing Manipulation and Hygiene: Supervision/safety;Sit to/from stand       Functional mobility during ADLs: Min guard General ADL Comments: minguard for more dynamic balancing during functional mobiltiy, supervision for sitting or static standing     Vision Baseline Vision/History: Wears glasses Wears Glasses: At all times Patient Visual Report: No change from baseline Vision Assessment?: No apparent visual deficits     Perception     Praxis      Pertinent Vitals/Pain Pain Assessment: No/denies pain     Hand Dominance Right   Extremity/Trunk Assessment Upper Extremity Assessment Upper Extremity Assessment: Overall WFL for tasks assessed   Lower Extremity Assessment Lower Extremity Assessment: Defer to PT evaluation RLE Deficits / Details: ROM WFL; MMT 5/5 LLE Deficits / Details: ROM WFL; MMT 5/5   Cervical / Trunk Assessment Cervical / Trunk Assessment: Normal   Communication Communication Communication: No difficulties   Cognition  Arousal/Alertness: Awake/alert Behavior During Therapy: WFL for tasks assessed/performed Overall Cognitive Status: Within Functional Limits for tasks assessed                                 General Comments: pt scored  2/28 on short blessed test indicating normal cognition.    General Comments  on RA, HR WNL    Exercises     Shoulder Instructions      Home Living Family/patient expects to be discharged to:: Private residence Living Arrangements: Alone Available Help at Discharge: Family;Available PRN/intermittently Type of Home: House Home Access: Stairs to enter [employer] of Steps: 3 Entrance Stairs-Rails: Left Home Layout: One level     Bathroom Shower/Tub: Occupational psychologist: Standard     Home Equipment: Environmental consultant - 2 wheels;Cane - single point;Shower seat          Prior Functioning/Environment Level of Independence: Needs assistance  Gait / Transfers Assistance Needed: Ambulates without AD typically ; limited community distance;pt endorses 1 fall outside whil getting her mail, had to have her son come assist her to get up ADL's / Homemaking Assistance Needed: Reports independent with ADLs and light IADLs            OT Problem List: Decreased activity tolerance;Impaired balance (sitting and/or standing);Decreased safety awareness;Decreased knowledge of precautions;Cardiopulmonary status limiting activity      OT Treatment/Interventions: Self-care/ADL training;Therapeutic exercise;Energy conservation;DME and/or AE instruction;Therapeutic activities;Patient/family education;Balance training    OT Goals(Current goals can be found in the care plan section) Acute Rehab OT Goals Patient Stated Goal: return home OT Goal Formulation: With patient Time For Goal Achievement: 05/30/20 Potential to Achieve Goals: Good ADL Goals Pt Will Perform Grooming: with modified independence;standing Pt Will Perform Lower Body Dressing: with modified independence;sit to/from stand Pt Will Transfer to Toilet: with modified independence;ambulating Additional ADL Goal #1: Pt will demonstrate independence with 3 fall precention strategies.  OT Frequency: Min 2X/week    Barriers to D/C:            Co-evaluation              AM-PAC OT "6 Clicks" Daily Activity     Outcome Measure Help from another person eating meals?: A Little Help from another person taking care of personal grooming?: A Little Help from another person toileting, which includes using toliet, bedpan, or urinal?: A Little Help from another person bathing (including washing, rinsing, drying)?: A Little Help from another person to put on and taking off regular upper body clothing?: A Little Help from another person to put on and taking off regular lower body clothing?: A Little 6 Click Score: 18   End of Session Nurse Communication: Mobility status  Activity Tolerance: Patient tolerated treatment well Patient left: in bed;Other (comment) (with staff to transport pt)  OT Visit Diagnosis: Unsteadiness on feet (R26.81);Other abnormalities of gait and mobility (R26.89);Muscle weakness (generalized) (M62.81);History of falling (Z91.81)                Time: 7619-5093 OT Time Calculation (min): 16 min Charges:  OT General Charges $OT Visit: 1 Visit OT Evaluation $OT Eval Moderate Complexity: [place] OTR/L Acute Rehabilitation Services Office: [place] 05/16/2020, 2:22 PM

## 2020-05-17 LAB — ALBUMIN, FLUID (OTHER): Albumin, Body Fluid Other: 2.6 g/dL

## 2020-05-20 LAB — BODY FLUID CULTURE
Culture: NO GROWTH
Special Requests: NORMAL

## 2020-05-21 ENCOUNTER — Encounter (HOSPITAL_COMMUNITY): Payer: Self-pay | Admitting: Pulmonary Disease

## 2020-05-24 ENCOUNTER — Ambulatory Visit (HOSPITAL_BASED_OUTPATIENT_CLINIC_OR_DEPARTMENT_OTHER): Payer: Medicare Other | Admitting: Medical

## 2020-05-24 ENCOUNTER — Inpatient Hospital Stay: Payer: Medicare Other

## 2020-05-24 ENCOUNTER — Other Ambulatory Visit: Payer: Self-pay | Admitting: Medical

## 2020-05-24 ENCOUNTER — Encounter: Payer: Self-pay | Admitting: Nurse Practitioner

## 2020-05-24 ENCOUNTER — Inpatient Hospital Stay: Payer: Medicare Other | Attending: Nurse Practitioner | Admitting: Nurse Practitioner

## 2020-05-24 ENCOUNTER — Other Ambulatory Visit: Payer: Self-pay

## 2020-05-24 ENCOUNTER — Encounter: Payer: Medicare Other | Admitting: Vascular Surgery

## 2020-05-24 VITALS — BP 135/62 | HR 72 | Temp 97.0°F | Resp 17 | Ht 65.0 in | Wt 152.6 lb

## 2020-05-24 DIAGNOSIS — J189 Pneumonia, unspecified organism: Secondary | ICD-10-CM | POA: Diagnosis not present

## 2020-05-24 DIAGNOSIS — Z5112 Encounter for antineoplastic immunotherapy: Secondary | ICD-10-CM | POA: Diagnosis not present

## 2020-05-24 DIAGNOSIS — R0602 Shortness of breath: Secondary | ICD-10-CM | POA: Insufficient documentation

## 2020-05-24 DIAGNOSIS — Z5111 Encounter for antineoplastic chemotherapy: Secondary | ICD-10-CM | POA: Diagnosis present

## 2020-05-24 DIAGNOSIS — Z1152 Encounter for screening for COVID-19: Secondary | ICD-10-CM | POA: Diagnosis not present

## 2020-05-24 DIAGNOSIS — C3491 Malignant neoplasm of unspecified part of right bronchus or lung: Secondary | ICD-10-CM

## 2020-05-24 DIAGNOSIS — D75839 Thrombocytosis, unspecified: Secondary | ICD-10-CM | POA: Diagnosis not present

## 2020-05-24 DIAGNOSIS — D472 Monoclonal gammopathy: Secondary | ICD-10-CM | POA: Insufficient documentation

## 2020-05-24 DIAGNOSIS — E871 Hypo-osmolality and hyponatremia: Secondary | ICD-10-CM | POA: Diagnosis not present

## 2020-05-24 DIAGNOSIS — R059 Cough, unspecified: Secondary | ICD-10-CM | POA: Insufficient documentation

## 2020-05-24 DIAGNOSIS — F32A Depression, unspecified: Secondary | ICD-10-CM | POA: Insufficient documentation

## 2020-05-24 DIAGNOSIS — I10 Essential (primary) hypertension: Secondary | ICD-10-CM | POA: Diagnosis not present

## 2020-05-24 DIAGNOSIS — F419 Anxiety disorder, unspecified: Secondary | ICD-10-CM | POA: Insufficient documentation

## 2020-05-24 DIAGNOSIS — C3411 Malignant neoplasm of upper lobe, right bronchus or lung: Secondary | ICD-10-CM | POA: Insufficient documentation

## 2020-05-24 DIAGNOSIS — D649 Anemia, unspecified: Secondary | ICD-10-CM | POA: Diagnosis not present

## 2020-05-24 DIAGNOSIS — J910 Malignant pleural effusion: Secondary | ICD-10-CM | POA: Insufficient documentation

## 2020-05-24 DIAGNOSIS — R918 Other nonspecific abnormal finding of lung field: Secondary | ICD-10-CM

## 2020-05-24 LAB — BASIC METABOLIC PANEL - CANCER CENTER ONLY
Anion gap: 11 (ref 5–15)
BUN: 9 mg/dL (ref 8–23)
CO2: 25 mmol/L (ref 22–32)
Calcium: 9.7 mg/dL (ref 8.9–10.3)
Chloride: 94 mmol/L — ABNORMAL LOW (ref 98–111)
Creatinine: 0.76 mg/dL (ref 0.44–1.00)
GFR, Estimated: >60 mL/min (ref >60)
Glucose, Bld: 106 mg/dL — ABNORMAL HIGH (ref 70–99)
Potassium: 4.4 mmol/L (ref 3.5–5.1)
Sodium: 130 mmol/L — ABNORMAL LOW (ref 135–145)

## 2020-05-24 LAB — SARS CORONAVIRUS 2 (TAT 6-24 HRS): SARS Coronavirus 2: NEGATIVE

## 2020-05-24 LAB — CYTOLOGY - NON PAP

## 2020-05-24 NOTE — Progress Notes (Addendum)
  [place] OFFICE PROGRESS NOTE   Diagnosis: Lung mass  INTERVAL HISTORY:   Ms. Fiebelkorn is seen in her first follow-up visit since hospital discharge.  She reports some increase in the shortness of breath.  She has a cough.  No fever.  Main complaint is pain involving the right foot.  Objective:  Vital signs in last 24 hours:  Blood pressure 135/62, pulse 72, temperature (!) 97 F (36.1 C), temperature source Tympanic, resp. rate 17, height 5\' 5"  (1.651 m), weight 152 lb 9.6 oz (69.2 kg), SpO2 96 %.    Resp: Breath sounds diminished right lung field.  No respiratory distress. Cardio: Regular with occasional premature beat. GI: No hepatomegaly. Vascular: Trace bilateral pretibial/ankle edema.  Lab Results:  Lab Results  Component Value Date   WBC 4.8 05/15/2020   HGB 11.9 (L) 05/15/2020   HCT 34.1 (L) 05/15/2020   MCV 84.4 05/15/2020   PLT 441 (H) 05/15/2020   NEUTROABS 4.0 12/16/2017    Imaging:  No results found.  Medications: I have reviewed the patient's current medications.  Assessment/Plan: 1.  Right lung mass concerning for malignancy -05/15/2020-CT chest with contrast showed a 7.2 x 5.9 cm right upper lobe lung mass with invasion into the right hilum and mediastinum, extensive mediastinal and right hilar lymphadenopathy, extensive interstitial spread of tumor in the right lung and large loculated right pleural fluid collection likely secondary to pleural metastatic disease, scattered pulmonary nodules in the left lung suspicious for metastatic disease, 13 x 10.5 mm spiculated lesion in the lingula which could be a second primary. -Cytology on pleural fluid 05/16/2020 positive for malignant cells; cluster of cells positive for MOC31, TTF-1, synaptophysin and CD 56; possibility of small cell carcinoma raised 2.  Pleural effusion-likely malignant 3.  Postobstructive pneumonia? 4.  Hyponatremia 5.  Mild normocytic anemia 6.  Thrombocytosis 7.   Hypertension 8.  AAA status post repair in 2009 9.  PAD 10.  Hyperlipidemia 11.  Depression  12.  Anxiety 13.  Hyponatremia  Disposition: Ms. Banko appears to have metastatic lung cancer.  Dr. Benay Spice has discussed the cytology results with the pathologist.  Additional material has been requested to try and establish a definitive diagnosis, small cell versus non-small cell.  This information was reviewed with Ms. Pensabene and her family at today's visit.  She agrees to a diagnostic/therapeutic thoracentesis.  We will make arrangements to have this done hopefully this week.  She will return for a follow-up visit on 05/30/2020 to review the results.  She will contact the office in the interim with any problems.  Patient seen with Dr. Benay Spice.    Ned Card ANP/GNP-BC   05/24/2020  11:18 AM  This was a shared visit with Ned Card.  Ms. Nawrot was interviewed and examined.  We reviewed CT images.  I discussed the case with interventional radiology.  Ms. Rison indicates she would like to consider treatment when a definitive diagnosis is obtained.  I discussed the case with pathology.  They are not able to make a definitive distinction between small cell and non-small cell lung cancer.  She will be referred for a therapeutic/diagnostic thoracentesis.  We will consider a CT-guided biopsy of the lung mass or a chest lymph node if the thoracentesis does not reveal adequate tissue.  Julieanne Manson, MD

## 2020-05-25 ENCOUNTER — Telehealth: Payer: Self-pay | Admitting: Oncology

## 2020-05-25 NOTE — Telephone Encounter (Signed)
Scheduled appointment per 12/1 los. Called patient, no answer. Left message for patient with appointment date and time.

## 2020-05-25 NOTE — Progress Notes (Signed)
Mary Merritt was seen today for COVID-19 testing prior to a planned thoracentesis later this week.  A specimen was collected and submitted for testing.  Sandi Mealy, MHS, PA-C Physician Assistant

## 2020-05-26 ENCOUNTER — Ambulatory Visit (HOSPITAL_COMMUNITY)
Admission: RE | Admit: 2020-05-26 | Discharge: 2020-05-26 | Disposition: A | Discharge status: Home / Self Care | Payer: Medicare Other | Source: Ambulatory Visit | Attending: Nurse Practitioner | Admitting: Nurse Practitioner

## 2020-05-26 ENCOUNTER — Ambulatory Visit (HOSPITAL_COMMUNITY)
Admission: RE | Admit: 2020-05-26 | Discharge: 2020-05-26 | Disposition: A | Discharge status: Home / Self Care | Payer: Medicare Other | Source: Ambulatory Visit | Attending: Radiology | Admitting: Radiology

## 2020-05-26 ENCOUNTER — Other Ambulatory Visit: Payer: Self-pay

## 2020-05-26 DIAGNOSIS — Z85118 Personal history of other malignant neoplasm of bronchus and lung: Secondary | ICD-10-CM | POA: Diagnosis not present

## 2020-05-26 DIAGNOSIS — J90 Pleural effusion, not elsewhere classified: Secondary | ICD-10-CM | POA: Insufficient documentation

## 2020-05-26 DIAGNOSIS — Z9889 Other specified postprocedural states: Secondary | ICD-10-CM | POA: Diagnosis not present

## 2020-05-26 DIAGNOSIS — C3491 Malignant neoplasm of unspecified part of right bronchus or lung: Secondary | ICD-10-CM | POA: Insufficient documentation

## 2020-05-26 MED ORDER — LIDOCAINE HCL 1 % IJ SOLN
INTRAMUSCULAR | Status: AC
  Filled 2020-05-26: qty 20

## 2020-05-26 NOTE — Procedures (Signed)
Ultrasound-guided diagnostic and therapeutic right thoracentesis performed yielding 1.3 liters of slightly hazy, yellow fluid. No immediate complications. Follow-up chest x-ray pending. EBL < 1 cc. Due to pt coughing/chest discomfort only the above amount of fluid was removed today.

## 2020-05-29 ENCOUNTER — Telehealth: Payer: Self-pay | Admitting: *Deleted

## 2020-05-29 NOTE — Telephone Encounter (Addendum)
Thoracentesis done on Friday. Asking if they still come in tomorrow? Will results be available by then? Per Dr. Jeannie Done (pathology): has ordered more immunos, so doubt it will be ready by tomorrow am. Per Dr. Benay Spice: Reschedule for 05/31/20 at [place] w/NP. Called [name] with new appointment.

## 2020-05-30 ENCOUNTER — Inpatient Hospital Stay: Payer: Medicare Other | Admitting: Oncology

## 2020-05-30 LAB — CYTOLOGY - NON PAP

## 2020-05-31 ENCOUNTER — Encounter: Payer: Self-pay | Admitting: Nurse Practitioner

## 2020-05-31 ENCOUNTER — Ambulatory Visit (HOSPITAL_COMMUNITY)
Admission: RE | Admit: 2020-05-31 | Discharge: 2020-05-31 | Disposition: A | Discharge status: Home / Self Care | Payer: Medicare Other | Source: Ambulatory Visit | Attending: Nurse Practitioner | Admitting: Nurse Practitioner

## 2020-05-31 ENCOUNTER — Telehealth: Payer: Self-pay | Admitting: Nurse Practitioner

## 2020-05-31 ENCOUNTER — Inpatient Hospital Stay: Payer: Medicare Other | Admitting: Nurse Practitioner

## 2020-05-31 ENCOUNTER — Other Ambulatory Visit: Payer: Self-pay

## 2020-05-31 VITALS — BP 119/75 | HR 67 | Temp 97.6°F | Resp 18 | Ht 65.0 in | Wt 152.2 lb

## 2020-05-31 DIAGNOSIS — R609 Edema, unspecified: Secondary | ICD-10-CM

## 2020-05-31 DIAGNOSIS — C3491 Malignant neoplasm of unspecified part of right bronchus or lung: Secondary | ICD-10-CM | POA: Diagnosis not present

## 2020-05-31 DIAGNOSIS — Z5112 Encounter for antineoplastic immunotherapy: Secondary | ICD-10-CM | POA: Diagnosis not present

## 2020-05-31 NOTE — Progress Notes (Signed)
@   11:10 call from [facility] in vascular lab: LLE negative for obvious DVT

## 2020-05-31 NOTE — Progress Notes (Addendum)
[place] OFFICE PROGRESS NOTE   Diagnosis: Lung mass  INTERVAL HISTORY:   Mary Merritt returns as scheduled.  She had a thoracentesis 05/26/2020.  Dyspnea improved for a few days.  She now has increased shortness of breath, some cough.  No fever.  Main complaint continues to be right leg pain with ambulation.  Objective:  Vital signs in last 24 hours:  Blood pressure 119/75, pulse 67, temperature 97.6 F (36.4 C), temperature source Tympanic, resp. rate 18, height 5\' 5"  (1.651 m), weight 152 lb 3.2 oz (69 kg), SpO2 91 %.    Resp: Inspiratory rales right lower lung field.  Slightly increased respiratory rate.  Cardio: Regular with premature beats. GI: No hepatomegaly. Vascular: 1+ edema throughout the left leg.   Lab Results:  Lab Results  Component Value Date   WBC 4.8 05/15/2020   HGB 11.9 (L) 05/15/2020   HCT 34.1 (L) 05/15/2020   MCV 84.4 05/15/2020   PLT 441 (H) 05/15/2020   NEUTROABS 4.0 12/16/2017    Imaging:  No results found.  Medications: I have reviewed the patient's current medications.  Assessment/Plan: 1. Right lung mass concerning for malignancy -05/15/2020-CT chest with contrast showed a 7.2 x 5.9 cm right upper lobe lung mass with invasion into the right hilum and mediastinum, extensive mediastinal and right hilar lymphadenopathy, extensive interstitial spread of tumor in the right lung and large loculated right pleural fluid collection likely secondary to pleural metastatic disease, scattered pulmonary nodules in the left lung suspicious for metastatic disease, 13 x 10.5 mm spiculated lesion in the lingula which could be a second primary. -Cytology on pleural fluid 05/16/2020 positive for malignant cells; cluster of cells positive for MOC31, TTF-1, synaptophysin and CD 56; possibility of small cell carcinoma raised -Cytology on pleural fluid 05/26/2020-malignant cells present with immunophenotype raising possibility of small cell  carcinoma 2. Pleural effusion-likely malignant 3.Postobstructive pneumonia? 4. Hyponatremia 5. Mild normocytic anemia 6. Thrombocytosis 7. Hypertension 8. AAA status post repair in 2009 9. PAD 10. Hyperlipidemia 11. Depression  12. Anxiety 13.Hyponatremia  Disposition: Mary Merritt appears unchanged.  Cytology on pleural fluid from 05/26/2020 again suspicious for small cell carcinoma.  Dr. Benay Spice has spoken with the pathologist, definitive diagnosis of small cell cannot be made based on the malignant cells in the pleural fluid.  Biopsy will be necessary to establish small cell versus non-small cell. Dr. Benay Spice reviewed these findings with Mary Merritt and her son.  They agree to proceed with the biopsy.  We have made a referral to interventional radiology.  If a diagnosis of small cell is confirmed Dr. Benay Spice recommends modified Carboplatin/Etoposide (carboplatin/etoposide day 1, etoposide day 2).  We reviewed potential toxicities associated with chemotherapy including bone marrow toxicity, nausea, hair loss, allergic reaction, diarrhea or constipation.  We scheduled a chemotherapy education class and baseline labs for later this week.  On exam today there is edema throughout the left leg.  We referred her for a venous Doppler which was negative for DVT.  Plan for a chest x-ray to follow-up the pleural effusion when she is here for chemo class.  She will return for follow-up on 06/09/2020.  We are available to see her sooner if needed.  Patient seen with Dr. Benay Spice.   Ned Card ANP/GNP-BC   05/31/2020  10:01 AM This was a shared visit with Ned Card.  We discussed treatment options with Mary Merritt and her son.  She would like to consider treatment of the lung cancer.  We  discussed systemic therapy with etoposide/carboplatin if a diagnosis of small cell lung cancer is confirmed.  We discussed placement of a palliative Pleurx catheter.  I reviewed CT images with  interventional radiology.  A right supraclavicular lymph node appears accessible for biopsy.  The plan is to obtain a biopsy of the right supraclavicular lymph node and then proceed with etoposide/carboplatin chemotherapy if a diagnosis of small cell lung cancer is confirmed.  We will refer her for a palliative thoracentesis as needed.  Julieanne Manson, MD

## 2020-05-31 NOTE — Telephone Encounter (Signed)
Scheduled appointments per 12/8 los. Called patient, no answer. Left message for patient with appointment date and time.

## 2020-05-31 NOTE — Progress Notes (Signed)
Left lower extremity venous duplex has been completed. Preliminary results can be found in CV Proc through chart review.  Results were given to Manuela Schwartz at Ned Card' NP office.  05/31/20 10:46 AM Mary Merritt RVT

## 2020-06-01 ENCOUNTER — Telehealth: Payer: Self-pay | Admitting: *Deleted

## 2020-06-01 ENCOUNTER — Encounter (HOSPITAL_COMMUNITY): Payer: Self-pay

## 2020-06-01 ENCOUNTER — Inpatient Hospital Stay: Payer: Medicare Other

## 2020-06-01 ENCOUNTER — Other Ambulatory Visit: Payer: Self-pay

## 2020-06-01 ENCOUNTER — Encounter: Payer: Self-pay | Admitting: General Practice

## 2020-06-01 DIAGNOSIS — C3491 Malignant neoplasm of unspecified part of right bronchus or lung: Secondary | ICD-10-CM

## 2020-06-01 DIAGNOSIS — Z5112 Encounter for antineoplastic immunotherapy: Secondary | ICD-10-CM | POA: Diagnosis not present

## 2020-06-01 LAB — CBC WITH DIFFERENTIAL (CANCER CENTER ONLY)
Abs Immature Granulocytes: 0.04 10*3/uL (ref 0.00–0.07)
Basophils Absolute: 0.1 10*3/uL (ref 0.0–0.1)
Basophils Relative: 1 %
Eosinophils Absolute: 0.2 10*3/uL (ref 0.0–0.5)
Eosinophils Relative: 3 %
HCT: 38.1 % (ref 36.0–46.0)
Hemoglobin: 12.7 g/dL (ref 12.0–15.0)
Immature Granulocytes: 1 %
Lymphocytes Relative: 16 %
Lymphs Abs: 1.2 10*3/uL (ref 0.7–4.0)
MCH: 29 pg (ref 26.0–34.0)
MCHC: 33.3 g/dL (ref 30.0–36.0)
MCV: 87 fL (ref 80.0–100.0)
Monocytes Absolute: 0.6 10*3/uL (ref 0.1–1.0)
Monocytes Relative: 8 %
Neutro Abs: 5.4 10*3/uL (ref 1.7–7.7)
Neutrophils Relative %: 71 %
Platelet Count: 536 10*3/uL — ABNORMAL HIGH (ref 150–400)
RBC: 4.38 MIL/uL (ref 3.87–5.11)
RDW: 13.2 % (ref 11.5–15.5)
WBC Count: 7.6 10*3/uL (ref 4.0–10.5)
nRBC: 0 % (ref 0.0–0.2)

## 2020-06-01 LAB — CMP (CANCER CENTER ONLY)
ALT: 9 U/L (ref 0–44)
AST: 18 U/L (ref 15–41)
Albumin: 2.8 g/dL — ABNORMAL LOW (ref 3.5–5.0)
Alkaline Phosphatase: 85 U/L (ref 38–126)
Anion gap: 11 (ref 5–15)
BUN: 9 mg/dL (ref 8–23)
CO2: 24 mmol/L (ref 22–32)
Calcium: 9.6 mg/dL (ref 8.9–10.3)
Chloride: 94 mmol/L — ABNORMAL LOW (ref 98–111)
Creatinine: 0.82 mg/dL (ref 0.44–1.00)
GFR, Estimated: >60 mL/min (ref >60)
Glucose, Bld: 101 mg/dL — ABNORMAL HIGH (ref 70–99)
Potassium: 4 mmol/L (ref 3.5–5.1)
Sodium: 129 mmol/L — ABNORMAL LOW (ref 135–145)
Total Bilirubin: 0.6 mg/dL (ref 0.3–1.2)
Total Protein: 6.7 g/dL (ref 6.5–8.1)

## 2020-06-01 NOTE — Progress Notes (Signed)
[facility] Psychosocial Distress Screening Clinical Social Work  Clinical Social Work was referred by distress screening protocol.  The patient scored a 5 on the Psychosocial Distress Thermometer which indicates moderate distress. Clinical Social Worker contacted patient by phone to assess for distress and other psychosocial needs. Patient is somewhat overwhelmed by all the new information and treatment plan for her cancer diagnosis, however "I guess we will make it!'  Willing to undergo recommended treatment plan.  No concerns re transportation - son and daughter are able to help especially if appointments are arranged around their work hours.  She lives alone, is considering hiring a housekeeper for additional help.  Briefly described services available from the [facility], encouraged her to reach out as needed for support/resources.  [facility] DISTRESS SCREENING 06/01/2020  Screening Type Initial Screening  Distress experienced in past week (1-10) 5  Emotional problem type Adjusting to illness;Boredom  Physical Problem type Pain;Breathing;Loss of appetitie;Constipation/diarrhea;Skin dry/itchy;Swollen arms/legs    Clinical Social Worker follow up needed: No.  If yes, follow up plan:  Beverely Pace, [place], LCSW Clinical Social Worker Phone:  [phone]

## 2020-06-01 NOTE — Telephone Encounter (Signed)
Patient wants to hold off on her education session until her biopsy results are back and treatment plan is final.

## 2020-06-01 NOTE — Progress Notes (Signed)
LB    2       Mary Merritt. [facility] Female, 86 y.o., [birth year]  MRN:  [id number] Phone:  [phone] Mary Merritt)       PCP:  Katherina Mires, MD Coverage:  [place] Medicare  Next Appt With Oncology 06/01/2020 at 11:30 AM           RE: Biopsy Received: Today  Message Details  Markus Daft, MD  Lennox [facility]ers E This biopsy is on hold for now, requesting PET. Discussed with oncology   [place]    Previous Messages  ----- Message -----  From: Lenore Cordia  Sent: 05/31/2020  3:01 PM EST  To: Ir Procedure Requests  Subject: Biopsy                      Procedure Requested: CT Biopsy    Reason for Procedure: biopsy lung mass    Provider Requesting: Owens Shark  Provider Telephone: [phone]    Other Info:

## 2020-06-02 ENCOUNTER — Other Ambulatory Visit: Payer: Self-pay | Admitting: Nurse Practitioner

## 2020-06-02 DIAGNOSIS — C3491 Malignant neoplasm of unspecified part of right bronchus or lung: Secondary | ICD-10-CM

## 2020-06-05 ENCOUNTER — Telehealth: Payer: Self-pay | Admitting: *Deleted

## 2020-06-05 ENCOUNTER — Encounter: Payer: Self-pay | Admitting: Nurse Practitioner

## 2020-06-05 ENCOUNTER — Other Ambulatory Visit: Payer: Self-pay | Admitting: Oncology

## 2020-06-05 DIAGNOSIS — Z7189 Other specified counseling: Secondary | ICD-10-CM

## 2020-06-05 DIAGNOSIS — C3491 Malignant neoplasm of unspecified part of right bronchus or lung: Secondary | ICD-10-CM

## 2020-06-05 DIAGNOSIS — C3490 Malignant neoplasm of unspecified part of unspecified bronchus or lung: Secondary | ICD-10-CM

## 2020-06-05 NOTE — Telephone Encounter (Signed)
error 

## 2020-06-05 NOTE — Progress Notes (Signed)
START ON PATHWAY REGIMEN - Small Cell Lung     Cycles 1 through 4, every 21 days:     Atezolizumab      Carboplatin      Etoposide    Cycles 5 and beyond, every 21 days:     Atezolizumab   **Always confirm dose/schedule in your pharmacy ordering system**  Patient Characteristics: Newly Diagnosed, Preoperative or Nonsurgical Candidate (Clinical Staging), First Line, Extensive Stage Therapeutic Status: Newly Diagnosed, Preoperative or Nonsurgical Candidate (Clinical Staging) AJCC T Category: Staged < 8th Ed. AJCC N Category: Staged < 8th Ed. AJCC M Category: Staged < 8th Ed. AJCC 8 Stage Grouping: Staged < 8th Ed. Stage Classification: Extensive  Intent of Therapy: Non-Curative / Palliative Intent, Discussed with Patient

## 2020-06-06 ENCOUNTER — Inpatient Hospital Stay: Payer: Medicare Other

## 2020-06-06 ENCOUNTER — Encounter (HOSPITAL_COMMUNITY): Payer: Self-pay

## 2020-06-06 ENCOUNTER — Telehealth: Payer: Self-pay

## 2020-06-06 ENCOUNTER — Other Ambulatory Visit: Payer: Self-pay

## 2020-06-06 ENCOUNTER — Other Ambulatory Visit (HOSPITAL_COMMUNITY)
Admission: RE | Admit: 2020-06-06 | Discharge: 2020-06-06 | Disposition: A | Discharge status: Home / Self Care | Payer: Medicare Other | Source: Ambulatory Visit | Attending: Oncology | Admitting: Oncology

## 2020-06-06 ENCOUNTER — Encounter: Payer: Self-pay | Admitting: *Deleted

## 2020-06-06 DIAGNOSIS — Z20822 Contact with and (suspected) exposure to covid-19: Secondary | ICD-10-CM | POA: Diagnosis not present

## 2020-06-06 DIAGNOSIS — Z01812 Encounter for preprocedural laboratory examination: Secondary | ICD-10-CM | POA: Insufficient documentation

## 2020-06-06 LAB — SARS CORONAVIRUS 2 (TAT 6-24 HRS): SARS Coronavirus 2: NEGATIVE

## 2020-06-06 NOTE — Progress Notes (Signed)
     [place] [facility] Female, 86 y.o., 1927-12-03  MRN:  [id number] Phone:  [phone] Jerilynn Mages)       PCP:  Katherina Mires, MD Coverage:  [place] [facility] ([facility] 3) 06/07/2020 at 1:00 PM           RE: Biopsy Received: Today  Message Details  Arne Cleveland, MD  Lenore Cordia Ok   [place] core supraclav LAN R  See below   DDH    Previous Messages  ----- Message -----  From: Lenore Cordia  Sent: 06/06/2020  8:59 AM EST  To: Ir Procedure Requests  Subject: Biopsy                      Procedure Requested: US Biopsy (Lymph)    Reason for Procedure: right supraclavicular LN    Provider Requesting: Dr Benay Spice  Provider Telephone: [phone]    Other Info:    Per Manuela Schwartz Per Dr Benay Spice, stated that Dr Benay Spice spoke with Dr Kathlene Cote  And that Dr Ponciano Ort December 17 @ 1pm at [facility]

## 2020-06-07 ENCOUNTER — Ambulatory Visit (HOSPITAL_COMMUNITY)
Admission: RE | Admit: 2020-06-07 | Discharge: 2020-06-07 | Disposition: A | Discharge status: Home / Self Care | Payer: Medicare Other | Source: Ambulatory Visit | Attending: Radiology | Admitting: Radiology

## 2020-06-07 ENCOUNTER — Encounter: Payer: Medicare Other | Admitting: Vascular Surgery

## 2020-06-07 ENCOUNTER — Other Ambulatory Visit: Payer: Self-pay

## 2020-06-07 ENCOUNTER — Other Ambulatory Visit: Payer: Self-pay | Admitting: Oncology

## 2020-06-07 ENCOUNTER — Ambulatory Visit (HOSPITAL_COMMUNITY)
Admission: RE | Admit: 2020-06-07 | Discharge: 2020-06-07 | Disposition: A | Discharge status: Home / Self Care | Payer: Medicare Other | Source: Ambulatory Visit | Attending: Oncology | Admitting: Oncology

## 2020-06-07 DIAGNOSIS — C3491 Malignant neoplasm of unspecified part of right bronchus or lung: Secondary | ICD-10-CM | POA: Insufficient documentation

## 2020-06-07 DIAGNOSIS — J90 Pleural effusion, not elsewhere classified: Secondary | ICD-10-CM | POA: Diagnosis present

## 2020-06-07 DIAGNOSIS — Z9889 Other specified postprocedural states: Secondary | ICD-10-CM

## 2020-06-07 DIAGNOSIS — J910 Malignant pleural effusion: Secondary | ICD-10-CM | POA: Insufficient documentation

## 2020-06-07 HISTORY — PX: IR THORACENTESIS ASP PLEURAL SPACE W/IMG GUIDE: IMG5380

## 2020-06-07 MED ORDER — LIDOCAINE HCL 1 % IJ SOLN
INTRAMUSCULAR | Status: DC | PRN
  Administered 2020-06-07: 10 mL

## 2020-06-07 MED ORDER — LIDOCAINE HCL 1 % IJ SOLN
INTRAMUSCULAR | Status: AC
  Filled 2020-06-07: qty 20

## 2020-06-07 NOTE — Progress Notes (Signed)
.  The following biosimilar Udenyca ([id number]) has been selected for use in this patient due to insurance requirements.  Henreitta Leber, PharmD

## 2020-06-07 NOTE — Procedures (Signed)
Ultrasound-guided diagnostic and therapeutic right thoracentesis performed yielding 1.2 liters of yellow fluid. No immediate complications. Follow-up chest x-ray pending. The fluid was sent to the lab for cytology. Due to pt coughing/chest discomfort only the above amount of fluid was removed today. EBL none.

## 2020-06-08 ENCOUNTER — Other Ambulatory Visit: Payer: Self-pay | Admitting: *Deleted

## 2020-06-08 ENCOUNTER — Other Ambulatory Visit: Payer: Self-pay | Admitting: Student

## 2020-06-08 MED ORDER — ONDANSETRON HCL 8 MG PO TABS: 8.0000 mg | ORAL_TABLET | Freq: Three times a day (TID) | ORAL | 1 refills | Status: DC | PRN

## 2020-06-08 MED ORDER — PROCHLORPERAZINE MALEATE 5 MG PO TABS: 5.0000 mg | ORAL_TABLET | Freq: Four times a day (QID) | ORAL | 0 refills | Status: DC | PRN

## 2020-06-08 NOTE — Progress Notes (Unsigned)
Anti-emetics sent to pharmacy in preparation for chemo next week. MD speaking w/pathologist to get bx results.

## 2020-06-09 ENCOUNTER — Ambulatory Visit (HOSPITAL_COMMUNITY)
Admission: RE | Admit: 2020-06-09 | Discharge: 2020-06-09 | Disposition: A | Discharge status: Home / Self Care | Payer: Medicare Other | Source: Ambulatory Visit | Attending: Nurse Practitioner | Admitting: Nurse Practitioner

## 2020-06-09 ENCOUNTER — Other Ambulatory Visit: Payer: Self-pay

## 2020-06-09 ENCOUNTER — Encounter (HOSPITAL_COMMUNITY): Payer: Self-pay

## 2020-06-09 ENCOUNTER — Other Ambulatory Visit: Payer: Medicare Other

## 2020-06-09 ENCOUNTER — Ambulatory Visit: Payer: Medicare Other | Admitting: Oncology

## 2020-06-09 DIAGNOSIS — C801 Malignant (primary) neoplasm, unspecified: Secondary | ICD-10-CM | POA: Insufficient documentation

## 2020-06-09 DIAGNOSIS — R918 Other nonspecific abnormal finding of lung field: Secondary | ICD-10-CM | POA: Diagnosis not present

## 2020-06-09 DIAGNOSIS — C771 Secondary and unspecified malignant neoplasm of intrathoracic lymph nodes: Secondary | ICD-10-CM | POA: Diagnosis not present

## 2020-06-09 DIAGNOSIS — C3491 Malignant neoplasm of unspecified part of right bronchus or lung: Secondary | ICD-10-CM

## 2020-06-09 HISTORY — DX: Dyspnea, unspecified: R06.00

## 2020-06-09 LAB — CYTOLOGY - NON PAP

## 2020-06-09 MED ORDER — LIDOCAINE HCL 1 % IJ SOLN
INTRAMUSCULAR | Status: AC
  Filled 2020-06-09: qty 20

## 2020-06-09 MED ORDER — LIDOCAINE HCL (PF) 1 % IJ SOLN
INTRAMUSCULAR | Status: AC | PRN
  Administered 2020-06-09: 5 mL

## 2020-06-09 MED ORDER — FENTANYL CITRATE (PF) 100 MCG/2ML IJ SOLN
INTRAMUSCULAR | Status: AC
  Filled 2020-06-09: qty 2

## 2020-06-09 MED ORDER — MIDAZOLAM HCL 2 MG/2ML IJ SOLN
INTRAMUSCULAR | Status: AC
  Filled 2020-06-09: qty 2

## 2020-06-09 MED ORDER — SODIUM CHLORIDE 0.9 % IV SOLN: INTRAVENOUS | Status: DC

## 2020-06-09 NOTE — Procedures (Signed)
Interventional Radiology Procedure Note  Procedure: [place] core bx of right supraclavicular LN.   Complications: None  Estimated Blood Loss: None  Recommendations: - DC home   Signed,  Criselda Peaches, MD

## 2020-06-09 NOTE — Progress Notes (Signed)
Pharmacist Chemotherapy Monitoring - Initial Assessment    Anticipated start date: 06/14/20   Regimen:   Are orders appropriate based on the patients diagnosis, regimen, and cycle? Yes  Does the plan date match the patients scheduled date? Yes  Is the sequencing of drugs appropriate? Yes  Are the premedications appropriate for the patients regimen? Yes  Prior Authorization for treatment is: Approved o If applicable, is the correct biosimilar selected based on the patient's insurance? not applicable  Organ Function and Labs:  Are dose adjustments needed based on the patient's renal function, hepatic function, or hematologic function? No   Are appropriate labs ordered prior to the start of patient's treatment? Yes  Other organ system assessment, if indicated: N/A  The following baseline labs, if indicated, have been ordered: atezolizumab: baseline TSH +/- T4  Dose Assessment:  Are the drug doses appropriate? No  Are the following correct: o Drug concentrations Yes o IV fluid compatible with drug Yes o Administration routes Yes o Timing of therapy Yes  If applicable, does the patient have documented access for treatment and/or plans for port-a-cath placement? no  If applicable, have lifetime cumulative doses been properly documented and assessed? not applicable Lifetime Dose Tracking  No doses have been documented on this patient for the following tracked chemicals: Doxorubicin, Epirubicin, Idarubicin, Daunorubicin, Mitoxantrone, Bleomycin, Oxaliplatin, Carboplatin, Liposomal Doxorubicin  o   Toxicity Monitoring/Prevention:  The patient has the following take home antiemetics prescribed: Ondansetron and Prochlorperazine  The patient has the following take home medications prescribed: N/A  Medication allergies and previous infusion related reactions, if applicable, have been reviewed and addressed. Yes  The patient's current medication list has been assessed for  drug-drug interactions with their chemotherapy regimen. no significant drug-drug interactions were identified on review.  Order Review:  Are the treatment plan orders signed? No  Is the patient scheduled to see a provider prior to their treatment? Yes  I verify that I have reviewed each item in the above checklist and answered each question accordingly.  Mary Merritt [facility] 06/09/2020 9:59 AM

## 2020-06-09 NOTE — H&P (Signed)
Referring Physician(s): Briscoe,Kim K/Sherrill,B  Supervising Physician: Jacqulynn Cadet  Patient Status:  [facility]  Chief Complaint:  "I'm having a biopsy"  Subjective: Patient familiar to IR service from recent thoracenteses, latest on 12/15 with cytology suspicious for small cell lung cancer. Prior CT chest has revealed right upper lobe lung mass with direct invasion of the right hilum and mediastinum, extensive mediastinal and right hilar lymphadenopathy, extensive interstitial spread of tumor in the right lung, above-noted loculated effusion, scattered small pulmonary nodules in left lung suspicious for metastatic disease with spiculated lesion in the lingula. She presents again today for image guided right supraclavicular lymph node biopsy for further evaluation/to help confirm diagnosis.  She currently denies fever, headache, chest pain, abdominal/back pain, nausea, vomiting or bleeding. She does have some chronic dyspnea, occasional cough.  Past Medical History:  Diagnosis Date  . AAA (abdominal aortic aneurysm) ([place])   . Anxiety   . Arthritis   . Dyspnea   . Dysrhythmia    bradycardia  . History of bronchitis   . Hyperlipidemia   . Hypertension   . Seasonal allergies    Past Surgical History:  Procedure Laterality Date  . ABDOMINAL AORTIC ANEURYSM REPAIR     2009, Dr. Scot Dock  . ABDOMINAL AORTOGRAM W/LOWER EXTREMITY Right 04/07/2020   Procedure: ABDOMINAL AORTOGRAM W/LOWER EXTREMITY;  Surgeon: Angelia Mould, MD;  Location: [facility];  Service: Cardiovascular;  Laterality: Right;  . CARDIAC CATHETERIZATION     patient thinks it was in 09 with dr Scot Dock  . cataracts removed    . INCONTINENCE SURGERY    . IR THORACENTESIS ASP PLEURAL SPACE W/IMG GUIDE  06/07/2020  . THORACENTESIS N/A 05/16/2020   Procedure: Mathews Robinsons;  Surgeon: Lanier Clam, MD;  Location: [facility];  Service: Pulmonary;  Laterality: N/A;  . TOTAL HIP ARTHROPLASTY  Right 08/01/2014   Procedure: RIGHT TOTAL HIP ARTHROPLASTY ANTERIOR APPROACH;  Surgeon: Marianna Payment, MD;  Location: [facility];  Service: Orthopedics;  Laterality: Right;  . TOTAL HIP ARTHROPLASTY Left 03/27/2016   Procedure: LEFT TOTAL HIP ARTHROPLASTY ANTERIOR APPROACH;  Surgeon: Leandrew Koyanagi, MD;  Location: [facility];  Service: Orthopedics;  Laterality: Left;       Allergies: Percocet [oxycodone-acetaminophen]  Medications: Prior to Admission medications   Medication Sig Start Date End Date Taking? Authorizing Provider  albuterol (VENTOLIN HFA) 108 (90 Base) MCG/ACT inhaler Inhale 2 puffs into the lungs every 4 (four) hours as needed for wheezing or shortness of breath.   Yes [provider]  amLODipine (NORVASC) 10 MG tablet Take 1 tablet by mouth once daily Patient taking differently: Take 10 mg by mouth daily. 02/29/20  Yes Pixie Casino, MD  aspirin EC 81 MG tablet Take 81 mg by mouth daily.   Yes [provider]  cetirizine (ZYRTEC) 10 MG tablet Take 10 mg by mouth at bedtime.   Yes [provider]  Cholecalciferol (VITAMIN D3 PO) Take 1 tablet by mouth at bedtime.   Yes [provider]  escitalopram (LEXAPRO) 10 MG tablet Take 10 mg by mouth daily.   Yes [provider]  ketotifen (ZADITOR) 0.025 % ophthalmic solution Place 1 drop into both eyes daily.   Yes [provider]  losartan (COZAAR) 50 MG tablet TAKE 1 TABLET BY MOUTH ONCE DAILY Patient taking differently: Take 50 mg by mouth at bedtime. 07/24/18  Yes Hilty, Nadean Corwin, MD  lovastatin (MEVACOR) 20 MG tablet TAKE 1 TABLET BY MOUTH  AT BEDTIME Patient taking differently: Take 20 mg by mouth at bedtime. 08/18/19  Yes Hilty, Nadean Corwin, MD  Magnesium 250 MG TABS Take 250 mg by mouth every other day.   Yes [provider]  melatonin 3 MG TABS tablet Take 1 tablet (3 mg total) by mouth at bedtime. 05/16/20  Yes Armando Reichert, MD  metoprolol tartrate (LOPRESSOR) 25 MG  tablet Take 0.5 tablets (12.5 mg total) by mouth 2 (two) times daily. 12/27/19  Yes Hilty, Nadean Corwin, MD  Omega-3 Fatty Acids (FISH OIL) 1000 MG CAPS Take 2,000 mg by mouth at bedtime.    Yes [provider]  acetaminophen (TYLENOL) 650 MG CR tablet Take 650 mg by mouth daily as needed for pain (headache).    [provider]  levocetirizine (XYZAL) 5 MG tablet Take 5 mg by mouth at bedtime.    [provider]  ondansetron (ZOFRAN) 8 MG tablet Take 1 tablet (8 mg total) by mouth every 8 (eight) hours as needed for nausea or vomiting. Begin 72 hours after day 1 IV infusion. 06/08/20   Ladell Pier, MD  prochlorperazine (COMPAZINE) 5 MG tablet Take 1 tablet (5 mg total) by mouth every 6 (six) hours as needed for nausea or vomiting. 06/08/20   Ladell Pier, MD     Vital Signs: BP (!) 149/67   Pulse 73   Temp 98.5 F (36.9 C) (Oral)   Resp 18   SpO2 91%   Physical Exam awake, alert.  Chest with diminished breath sounds right base, left clear.  Heart with regular rate and rhythm.  Abdomen soft, positive bowel sounds, nontender.  No lower extremity edema.  Imaging: DG Chest 1 View  Result Date: 06/07/2020 CLINICAL DATA:  Post right thoracentesis EXAM: CHEST  1 VIEW COMPARISON:  05/26/2020 FINDINGS: No pneumothorax post right thoracentesis. Mild increase in right pleural effusion. Right lower lobe airspace disease unchanged. Right upper lobe mass and surrounding airspace disease shows progression from the prior study. Left lung clear. IMPRESSION: No pneumothorax post right thoracentesis. Increased pleural effusion since 05/26/2020 Progression of right upper lobe mass and surrounding infiltrate. Electronically Signed   By: Franchot Gallo M.D.   On: 06/07/2020 14:15   IR THORACENTESIS ASP PLEURAL SPACE W/IMG GUIDE  Result Date: 06/07/2020 INDICATION: Patient with history of lung cancer, dyspnea, recurrent malignant right pleural effusion; request received for  diagnostic and therapeutic right thoracentesis. EXAM: ULTRASOUND GUIDED DIAGNOSTIC AND THERAPEUTIC RIGHT THORACENTESIS MEDICATIONS: 1% lidocaine to skin and SQ tissue COMPLICATIONS: None immediate. PROCEDURE: An ultrasound guided thoracentesis was thoroughly discussed with the patient and questions answered. The benefits, risks, alternatives and complications were also discussed. The patient understands and wishes to proceed with the procedure. Written consent was obtained. Ultrasound was performed to localize and mark an adequate pocket of fluid in the right chest. The area was then prepped and draped in the normal sterile fashion. 1% Lidocaine was used for local anesthesia. Under ultrasound guidance a 6 Fr Safe-T-Centesis catheter was introduced. Thoracentesis was performed. The catheter was removed and a dressing applied. FINDINGS: A total of approximately 1.2 liters of yellow fluid was removed. Samples were sent to the laboratory as requested by the clinical team. Due to patient coughing and chest discomfort only the above amount of fluid was removed today. IMPRESSION: Successful ultrasound guided diagnostic and therapeutic right thoracentesis yielding 1.2 liters of pleural fluid. Read by: Rowe Robert, PA-C Electronically Signed   By: Jacqulynn Cadet M.D.   On: 06/07/2020  14:05    Labs:  CBC: Recent Labs    04/07/20 1013 05/14/20 1548 05/15/20 0253 06/01/20 1141  WBC  --  8.6 4.8 7.6  HGB 13.9 12.7 11.9* 12.7  HCT 41.0 37.8 34.1* 38.1  PLT  --  440* 441* 536*    COAGS: No results for input(s): INR, APTT in the last 8760 hours.  BMP: Recent Labs    05/15/20 0253 05/16/20 0559 05/24/20 1045 06/01/20 1141  NA 120* 124* 130* 129*  K 3.9 3.8 4.4 4.0  CL 87* 91* 94* 94*  CO2 19* 24 25 24   GLUCOSE 183* 122* 106* 101*  BUN 8 7* 9 9  CALCIUM 9.1 8.9 9.7 9.6  CREATININE 0.89 0.71 0.76 0.82  GFRNONAA >60 >60 >60 >60    LIVER FUNCTION TESTS: Recent Labs    05/15/20 0253  06/01/20 1141  BILITOT 0.7 0.6  AST 37 18  ALT 14 9  ALKPHOS 61 85  PROT 5.6* 6.7  ALBUMIN 2.7* 2.8*    Assessment and Plan: Patient familiar to IR service from recent thoracenteses, latest on 12/15 with cytology suspicious for small cell lung cancer. Prior CT chest has revealed right upper lobe lung mass with direct invasion of the right hilum and mediastinum, extensive mediastinal and right hilar lymphadenopathy, extensive interstitial spread of tumor in the right lung, above-noted loculated effusion, scattered small pulmonary nodules in left lung suspicious for metastatic disease with spiculated lesion in the lingula. She presents again today for image guided right supraclavicular lymph node biopsy for further evaluation/to help confirm diagnosis. Risks and benefits of procedure was discussed with the patient  including, but not limited to bleeding, infection, damage to adjacent structures or low yield requiring additional tests.  All of the questions were answered and there is agreement to proceed.  Consent signed and in chart.     Electronically Signed: D. Rowe Robert, PA-C 06/09/2020, 12:21 PM   I spent a total of 20 minutes at the the patient's bedside AND on the patient's hospital floor or unit, greater than 50% of which was counseling/coordinating care for ultrasound-guided biopsy of right supraclavicular lymph node

## 2020-06-09 NOTE — Discharge Instructions (Signed)

## 2020-06-11 ENCOUNTER — Other Ambulatory Visit: Payer: Self-pay | Admitting: Oncology

## 2020-06-12 ENCOUNTER — Telehealth: Payer: Self-pay | Admitting: *Deleted

## 2020-06-12 DIAGNOSIS — C3490 Malignant neoplasm of unspecified part of unspecified bronchus or lung: Secondary | ICD-10-CM

## 2020-06-12 NOTE — Telephone Encounter (Signed)
Patient getting uncomfortable breathing again and wants thoracentesis before Christmas.  Able to schedule for 0845/0900 at [facility] with COVID test on 06/13/20 at 0910 at [facility] location. Daughter notified and agrees.

## 2020-06-13 ENCOUNTER — Other Ambulatory Visit (HOSPITAL_COMMUNITY)
Admission: RE | Admit: 2020-06-13 | Discharge: 2020-06-13 | Disposition: A | Discharge status: Home / Self Care | Payer: Medicare Other | Source: Ambulatory Visit | Attending: Oncology | Admitting: Oncology

## 2020-06-13 ENCOUNTER — Other Ambulatory Visit: Payer: Self-pay | Admitting: *Deleted

## 2020-06-13 DIAGNOSIS — Z20822 Contact with and (suspected) exposure to covid-19: Secondary | ICD-10-CM | POA: Diagnosis not present

## 2020-06-13 DIAGNOSIS — Z01812 Encounter for preprocedural laboratory examination: Secondary | ICD-10-CM | POA: Insufficient documentation

## 2020-06-13 DIAGNOSIS — C3490 Malignant neoplasm of unspecified part of unspecified bronchus or lung: Secondary | ICD-10-CM

## 2020-06-13 LAB — SARS CORONAVIRUS 2 (TAT 6-24 HRS): SARS Coronavirus 2: NEGATIVE

## 2020-06-13 LAB — SURGICAL PATHOLOGY

## 2020-06-14 ENCOUNTER — Ambulatory Visit (HOSPITAL_COMMUNITY)
Admission: RE | Admit: 2020-06-14 | Discharge: 2020-06-14 | Disposition: A | Discharge status: Home / Self Care | Payer: Medicare Other | Source: Ambulatory Visit | Attending: Oncology | Admitting: Oncology

## 2020-06-14 ENCOUNTER — Inpatient Hospital Stay: Payer: Medicare Other

## 2020-06-14 ENCOUNTER — Other Ambulatory Visit: Payer: Self-pay

## 2020-06-14 ENCOUNTER — Other Ambulatory Visit: Payer: Self-pay | Admitting: Oncology

## 2020-06-14 ENCOUNTER — Inpatient Hospital Stay (HOSPITAL_BASED_OUTPATIENT_CLINIC_OR_DEPARTMENT_OTHER): Payer: Medicare Other | Admitting: Nurse Practitioner

## 2020-06-14 ENCOUNTER — Encounter: Payer: Self-pay | Admitting: Nurse Practitioner

## 2020-06-14 ENCOUNTER — Encounter: Payer: Self-pay | Admitting: Oncology

## 2020-06-14 VITALS — BP 128/67 | HR 80 | Temp 98.0°F | Resp 18 | Ht 65.0 in | Wt 149.7 lb

## 2020-06-14 DIAGNOSIS — C3490 Malignant neoplasm of unspecified part of unspecified bronchus or lung: Secondary | ICD-10-CM

## 2020-06-14 DIAGNOSIS — J90 Pleural effusion, not elsewhere classified: Secondary | ICD-10-CM | POA: Insufficient documentation

## 2020-06-14 DIAGNOSIS — J984 Other disorders of lung: Secondary | ICD-10-CM | POA: Insufficient documentation

## 2020-06-14 DIAGNOSIS — Z5112 Encounter for antineoplastic immunotherapy: Secondary | ICD-10-CM | POA: Diagnosis not present

## 2020-06-14 HISTORY — PX: IR THORACENTESIS ASP PLEURAL SPACE W/IMG GUIDE: IMG5380

## 2020-06-14 LAB — CMP (CANCER CENTER ONLY)
ALT: 11 U/L (ref 0–44)
AST: 19 U/L (ref 15–41)
Albumin: 2.7 g/dL — ABNORMAL LOW (ref 3.5–5.0)
Alkaline Phosphatase: 91 U/L (ref 38–126)
Anion gap: 13 (ref 5–15)
BUN: 9 mg/dL (ref 8–23)
CO2: 22 mmol/L (ref 22–32)
Calcium: 9.4 mg/dL (ref 8.9–10.3)
Chloride: 93 mmol/L — ABNORMAL LOW (ref 98–111)
Creatinine: 0.73 mg/dL (ref 0.44–1.00)
GFR, Estimated: >60 mL/min (ref >60)
Glucose, Bld: 131 mg/dL — ABNORMAL HIGH (ref 70–99)
Potassium: 4.2 mmol/L (ref 3.5–5.1)
Sodium: 128 mmol/L — ABNORMAL LOW (ref 135–145)
Total Bilirubin: 0.5 mg/dL (ref 0.3–1.2)
Total Protein: 6.6 g/dL (ref 6.5–8.1)

## 2020-06-14 LAB — CBC WITH DIFFERENTIAL (CANCER CENTER ONLY)
Abs Immature Granulocytes: 0.04 10*3/uL (ref 0.00–0.07)
Basophils Absolute: 0 10*3/uL (ref 0.0–0.1)
Basophils Relative: 0 %
Eosinophils Absolute: 0.1 10*3/uL (ref 0.0–0.5)
Eosinophils Relative: 1 %
HCT: 37.7 % (ref 36.0–46.0)
Hemoglobin: 12.9 g/dL (ref 12.0–15.0)
Immature Granulocytes: 0 %
Lymphocytes Relative: 13 %
Lymphs Abs: 1.2 10*3/uL (ref 0.7–4.0)
MCH: 28.9 pg (ref 26.0–34.0)
MCHC: 34.2 g/dL (ref 30.0–36.0)
MCV: 84.3 fL (ref 80.0–100.0)
Monocytes Absolute: 0.8 10*3/uL (ref 0.1–1.0)
Monocytes Relative: 9 %
Neutro Abs: 6.9 10*3/uL (ref 1.7–7.7)
Neutrophils Relative %: 77 %
Platelet Count: 488 10*3/uL — ABNORMAL HIGH (ref 150–400)
RBC: 4.47 MIL/uL (ref 3.87–5.11)
RDW: 13.4 % (ref 11.5–15.5)
WBC Count: 9.1 10*3/uL (ref 4.0–10.5)
nRBC: 0 % (ref 0.0–0.2)

## 2020-06-14 MED ORDER — SODIUM CHLORIDE 0.9 % IV SOLN
60.0000 mg/m2 | Freq: Once | INTRAVENOUS | Status: AC
  Administered 2020-06-14: 17:00:00 110 mg via INTRAVENOUS
  Filled 2020-06-14: qty 5.5

## 2020-06-14 MED ORDER — SODIUM CHLORIDE 0.9 % IV SOLN
262.8000 mg | Freq: Once | INTRAVENOUS | Status: AC
  Administered 2020-06-14: 16:00:00 260 mg via INTRAVENOUS
  Filled 2020-06-14: qty 26

## 2020-06-14 MED ORDER — PALONOSETRON HCL INJECTION 0.25 MG/5ML
INTRAVENOUS | Status: AC
  Filled 2020-06-14: qty 5

## 2020-06-14 MED ORDER — LIDOCAINE HCL 1 % IJ SOLN
INTRAMUSCULAR | Status: AC
  Filled 2020-06-14: qty 20

## 2020-06-14 MED ORDER — SODIUM CHLORIDE 0.9 % IV SOLN
Freq: Once | INTRAVENOUS | Status: AC
Start: 2020-06-14 — End: 2020-06-14
  Filled 2020-06-14: qty 250

## 2020-06-14 MED ORDER — SODIUM CHLORIDE 0.9 % IV SOLN
1200.0000 mg | Freq: Once | INTRAVENOUS | Status: AC
  Administered 2020-06-14: 15:00:00 1200 mg via INTRAVENOUS
  Filled 2020-06-14: qty 20

## 2020-06-14 MED ORDER — SODIUM CHLORIDE 0.9 % IV SOLN
10.0000 mg | Freq: Once | INTRAVENOUS | Status: AC
  Administered 2020-06-14: 14:00:00 10 mg via INTRAVENOUS
  Filled 2020-06-14: qty 10

## 2020-06-14 MED ORDER — LIDOCAINE HCL (PF) 1 % IJ SOLN
INTRAMUSCULAR | Status: DC | PRN
  Administered 2020-06-14: 10 mL

## 2020-06-14 MED ORDER — SODIUM CHLORIDE 0.9 % IV SOLN
150.0000 mg | Freq: Once | INTRAVENOUS | Status: AC
  Administered 2020-06-14: 14:00:00 150 mg via INTRAVENOUS
  Filled 2020-06-14: qty 150

## 2020-06-14 MED ORDER — PALONOSETRON HCL INJECTION 0.25 MG/5ML
0.2500 mg | Freq: Once | INTRAVENOUS | Status: AC
  Administered 2020-06-14: 14:00:00 0.25 mg via INTRAVENOUS

## 2020-06-14 NOTE — Progress Notes (Signed)
Met with patient/accompanying adult at registration to introduce myself as Arboriculturist and to offer available resources.  Discussed one-time $1000 Radio broadcast assistant to assist with personal expenses while going through treatment. Advised what is needed to apply.  Gave her my card if interested in applying and for any additional financial questions or concerns.

## 2020-06-14 NOTE — Patient Instructions (Signed)
[facility] Discharge Instructions for Patients Receiving Chemotherapy  Today you received the following chemotherapy agents Tecentriq; carboplatin; etoposide  To help prevent nausea and vomiting after your treatment, we encourage you to take your nausea medication as directed   If you develop nausea and vomiting that is not controlled by your nausea medication, call the clinic.   BELOW ARE SYMPTOMS THAT SHOULD BE REPORTED IMMEDIATELY:  *FEVER GREATER THAN 100.5 F  *CHILLS WITH OR WITHOUT FEVER  NAUSEA AND VOMITING THAT IS NOT CONTROLLED WITH YOUR NAUSEA MEDICATION  *UNUSUAL SHORTNESS OF BREATH  *UNUSUAL BRUISING OR BLEEDING  TENDERNESS IN MOUTH AND THROAT WITH OR WITHOUT PRESENCE OF ULCERS  *URINARY PROBLEMS  *BOWEL PROBLEMS  UNUSUAL RASH Items with * indicate a potential emergency and should be followed up as soon as possible.  Feel free to call the clinic should you have any questions or concerns. The clinic phone number is [phone].  Please show the [facility] at check-in to the Emergency Department and triage nurse.  Atezolizumab injection What is this medicine? ATEZOLIZUMAB (a te zoe LIZ ue mab) is a monoclonal antibody. It is used to treat bladder cancer (urothelial cancer), liver cancer, lung cancer, breast cancer, and melanoma. This medicine may be used for other purposes; ask your health care provider or pharmacist if you have questions. COMMON BRAND NAME(S): Tecentriq What should I tell my health care provider before I take this medicine? They need to know if you have any of these conditions:  diabetes  immune system problems  infection  inflammatory bowel disease  liver disease  lung or breathing disease  lupus  nervous system problems like myasthenia gravis or Guillain-Barre syndrome  organ transplant  an unusual or allergic reaction to atezolizumab, other medicines, foods, dyes, or preservatives  pregnant or trying  to get pregnant  breast-feeding How should I use this medicine? This medicine is for infusion into a vein. It is given by a health care professional in a hospital or clinic setting. A special MedGuide will be given to you before each treatment. Be sure to read this information carefully each time. Talk to your pediatrician regarding the use of this medicine in children. Special care may be needed. Overdosage: If you think you have taken too much of this medicine contact a poison control center or emergency room at once. NOTE: This medicine is only for you. Do not share this medicine with others. What if I miss a dose? It is important not to miss your dose. Call your doctor or health care professional if you are unable to keep an appointment. What may interact with this medicine? Interactions have not been studied. This list may not describe all possible interactions. Give your health care provider a list of all the medicines, herbs, non-prescription drugs, or dietary supplements you use. Also tell them if you smoke, drink alcohol, or use illegal drugs. Some items may interact with your medicine. What should I watch for while using this medicine? Your condition will be monitored carefully while you are receiving this medicine. You may need blood work done while you are taking this medicine. Do not become pregnant while taking this medicine or for at least 5 months after stopping it. Women should inform their doctor if they wish to become pregnant or think they might be pregnant. There is a potential for serious side effects to an unborn child. Talk to your health care professional or pharmacist for more information. Do not breast-feed an  infant while taking this medicine or for at least 5 months after the last dose. What side effects may I notice from receiving this medicine? Side effects that you should report to your doctor or health care professional as soon as possible:  allergic reactions  like skin rash, itching or hives, swelling of the face, lips, or tongue  black, tarry stools  bloody or watery diarrhea  breathing problems  changes in vision  chest pain or chest tightness  chills  facial flushing  fever  headache  signs and symptoms of high blood sugar such as dizziness; dry mouth; dry skin; fruity breath; nausea; stomach pain; increased hunger or thirst; increased urination  signs and symptoms of liver injury like dark yellow or [place] urine; general ill feeling or flu-like symptoms; light-colored stools; loss of appetite; nausea; right upper belly pain; unusually weak or tired; yellowing of the eyes or skin  stomach pain  trouble passing urine or change in the amount of urine Side effects that usually do not require medical attention (report to your doctor or health care professional if they continue or are bothersome):  bone pain  cough  diarrhea  joint pain  muscle pain  muscle weakness  swelling of arms or legs  tiredness  weight loss This list may not describe all possible side effects. Call your doctor for medical advice about side effects. You may report side effects to FDA at 1-800-FDA-1088. Where should I keep my medicine? This drug is given in a hospital or clinic and will not be stored at home. NOTE: This sheet is a summary. It may not cover all possible information. If you have questions about this medicine, talk to your doctor, pharmacist, or health care provider.  2020 Elsevier/Gold Standard (2019-01-29 13:11:14)  Carboplatin injection What is this medicine? CARBOPLATIN (KAR boe pla tin) is a chemotherapy drug. It targets fast dividing cells, like cancer cells, and causes these cells to die. This medicine is used to treat ovarian cancer and many other cancers. This medicine may be used for other purposes; ask your health care provider or pharmacist if you have questions. COMMON BRAND NAME(S): Paraplatin What should I tell my  health care provider before I take this medicine? They need to know if you have any of these conditions:  blood disorders  hearing problems  kidney disease  recent or ongoing radiation therapy  an unusual or allergic reaction to carboplatin, cisplatin, other chemotherapy, other medicines, foods, dyes, or preservatives  pregnant or trying to get pregnant  breast-feeding How should I use this medicine? This drug is usually given as an infusion into a vein. It is administered in a hospital or clinic by a specially trained health care professional. Talk to your pediatrician regarding the use of this medicine in children. Special care may be needed. Overdosage: If you think you have taken too much of this medicine contact a poison control center or emergency room at once. NOTE: This medicine is only for you. Do not share this medicine with others. What if I miss a dose? It is important not to miss a dose. Call your doctor or health care professional if you are unable to keep an appointment. What may interact with this medicine?  medicines for seizures  medicines to increase blood counts like filgrastim, pegfilgrastim, sargramostim  some antibiotics like amikacin, gentamicin, neomycin, streptomycin, tobramycin  vaccines Talk to your doctor or health care professional before taking any of these medicines:  acetaminophen  aspirin  ibuprofen  ketoprofen  naproxen This list may not describe all possible interactions. Give your health care provider a list of all the medicines, herbs, non-prescription drugs, or dietary supplements you use. Also tell them if you smoke, drink alcohol, or use illegal drugs. Some items may interact with your medicine. What should I watch for while using this medicine? Your condition will be monitored carefully while you are receiving this medicine. You will need important blood work done while you are taking this medicine. This drug may make you feel  generally unwell. This is not uncommon, as chemotherapy can affect healthy cells as well as cancer cells. Report any side effects. Continue your course of treatment even though you feel ill unless your doctor tells you to stop. In some cases, you may be given additional medicines to help with side effects. Follow all directions for their use. Call your doctor or health care professional for advice if you get a fever, chills or sore throat, or other symptoms of a cold or flu. Do not treat yourself. This drug decreases your body's ability to fight infections. Try to avoid being around people who are sick. This medicine may increase your risk to bruise or bleed. Call your doctor or health care professional if you notice any unusual bleeding. Be careful brushing and flossing your teeth or using a toothpick because you may get an infection or bleed more easily. If you have any dental work done, tell your dentist you are receiving this medicine. Avoid taking products that contain aspirin, acetaminophen, ibuprofen, naproxen, or ketoprofen unless instructed by your doctor. These medicines may hide a fever. Do not become pregnant while taking this medicine. Women should inform their doctor if they wish to become pregnant or think they might be pregnant. There is a potential for serious side effects to an unborn child. Talk to your health care professional or pharmacist for more information. Do not breast-feed an infant while taking this medicine. What side effects may I notice from receiving this medicine? Side effects that you should report to your doctor or health care professional as soon as possible:  allergic reactions like skin rash, itching or hives, swelling of the face, lips, or tongue  signs of infection - fever or chills, cough, sore throat, pain or difficulty passing urine  signs of decreased platelets or bleeding - bruising, pinpoint red spots on the skin, black, tarry stools, nosebleeds  signs  of decreased red blood cells - unusually weak or tired, fainting spells, lightheadedness  breathing problems  changes in hearing  changes in vision  chest pain  high blood pressure  low blood counts - This drug may decrease the number of white blood cells, red blood cells and platelets. You may be at increased risk for infections and bleeding.  nausea and vomiting  pain, swelling, redness or irritation at the injection site  pain, tingling, numbness in the hands or feet  problems with balance, talking, walking  trouble passing urine or change in the amount of urine Side effects that usually do not require medical attention (report to your doctor or health care professional if they continue or are bothersome):  hair loss  loss of appetite  metallic taste in the mouth or changes in taste This list may not describe all possible side effects. Call your doctor for medical advice about side effects. You may report side effects to FDA at 1-800-FDA-1088. Where should I keep my medicine? This drug is given in a hospital or clinic and will  not be stored at home. NOTE: This sheet is a summary. It may not cover all possible information. If you have questions about this medicine, talk to your doctor, pharmacist, or health care provider.  2020 Elsevier/Gold Standard (2007-09-15 14:38:05)  Etoposide, VP-16 injection What is this medicine? ETOPOSIDE, VP-16 (e toe POE side) is a chemotherapy drug. It is used to treat testicular cancer, lung cancer, and other cancers. This medicine may be used for other purposes; ask your health care provider or pharmacist if you have questions. COMMON BRAND NAME(S): Etopophos, Toposar, VePesid What should I tell my health care provider before I take this medicine? They need to know if you have any of these conditions:  infection  kidney disease  liver disease  low blood counts, like low white cell, platelet, or red cell counts  an unusual or  allergic reaction to etoposide, other medicines, foods, dyes, or preservatives  pregnant or trying to get pregnant  breast-feeding How should I use this medicine? This medicine is for infusion into a vein. It is administered in a hospital or clinic by a specially trained health care professional. Talk to your pediatrician regarding the use of this medicine in children. Special care may be needed. Overdosage: If you think you have taken too much of this medicine contact a poison control center or emergency room at once. NOTE: This medicine is only for you. Do not share this medicine with others. What if I miss a dose? It is important not to miss your dose. Call your doctor or health care professional if you are unable to keep an appointment. What may interact with this medicine? This medicine may interact with the following medications:  warfarin This list may not describe all possible interactions. Give your health care provider a list of all the medicines, herbs, non-prescription drugs, or dietary supplements you use. Also tell them if you smoke, drink alcohol, or use illegal drugs. Some items may interact with your medicine. What should I watch for while using this medicine? Visit your doctor for checks on your progress. This drug may make you feel generally unwell. This is not uncommon, as chemotherapy can affect healthy cells as well as cancer cells. Report any side effects. Continue your course of treatment even though you feel ill unless your doctor tells you to stop. In some cases, you may be given additional medicines to help with side effects. Follow all directions for their use. Call your doctor or health care professional for advice if you get a fever, chills or sore throat, or other symptoms of a cold or flu. Do not treat yourself. This drug decreases your body's ability to fight infections. Try to avoid being around people who are sick. This medicine may increase your risk to bruise  or bleed. Call your doctor or health care professional if you notice any unusual bleeding. Talk to your doctor about your risk of cancer. You may be more at risk for certain types of cancers if you take this medicine. Do not become pregnant while taking this medicine or for at least 6 months after stopping it. Women should inform their doctor if they wish to become pregnant or think they might be pregnant. Women of child-bearing potential will need to have a negative pregnancy test before starting this medicine. There is a potential for serious side effects to an unborn child. Talk to your health care professional or pharmacist for more information. Do not breast-feed an infant while taking this medicine. Men must use  a latex condom during sexual contact with a woman while taking this medicine and for at least 4 months after stopping it. A latex condom is needed even if you have had a vasectomy. Contact your doctor right away if your partner becomes pregnant. Do not donate sperm while taking this medicine and for at least 4 months after you stop taking this medicine. Men should inform their doctors if they wish to father a child. This medicine may lower sperm counts. What side effects may I notice from receiving this medicine? Side effects that you should report to your doctor or health care professional as soon as possible:  allergic reactions like skin rash, itching or hives, swelling of the face, lips, or tongue  low blood counts - this medicine may decrease the number of white blood cells, red blood cells, and platelets. You may be at increased risk for infections and bleeding  nausea, vomiting  redness, blistering, peeling or loosening of the skin, including inside the mouth  signs and symptoms of infection like fever; chills; cough; sore throat; pain or trouble passing urine  signs and symptoms of low red blood cells or anemia such as unusually weak or tired; feeling faint or lightheaded;  falls; breathing problems  unusual bruising or bleeding Side effects that usually do not require medical attention (report to your doctor or health care professional if they continue or are bothersome):  changes in taste  diarrhea  hair loss  loss of appetite  mouth sores This list may not describe all possible side effects. Call your doctor for medical advice about side effects. You may report side effects to FDA at 1-800-FDA-1088. Where should I keep my medicine? This drug is given in a hospital or clinic and will not be stored at home. NOTE: This sheet is a summary. It may not cover all possible information. If you have questions about this medicine, talk to your doctor, pharmacist, or health care provider.  2020 Elsevier/Gold Standard (2018-08-05 16:57:15)

## 2020-06-14 NOTE — Procedures (Signed)
PROCEDURE SUMMARY:  Successful US guided right diagnostic and therapeutic thoracentesis. Yielded 1.4 liters of dark, yellow fluid. Pt tolerated procedure well. No immediate complications.  Specimen was not sent for labs. CXR ordered.  EBL < 5 mL  Docia Barrier PA-C 06/14/2020 9:46 AM

## 2020-06-14 NOTE — Progress Notes (Addendum)
[facility] OFFICE PROGRESS NOTE   Diagnosis: Small cell lung cancer  INTERVAL HISTORY:   Mary Merritt returns as scheduled.  She had a thoracentesis earlier today.  She notices improvement in the dyspnea.  She has some discomfort at the lower chest/upper abdomen.  She had a similar discomfort prior to a another thoracentesis which resolved after the procedure.  Appetite varies.  Objective:  Vital signs in last 24 hours:  Blood pressure 128/67, pulse 80, temperature 98 F (36.7 C), temperature source Tympanic, resp. rate 18, height 5\' 5"  (1.651 m), weight 149 lb 11.2 oz (67.9 kg), SpO2 94 %.   Resp: Diminished breath sounds right lower lung field.  No respiratory distress.  Wheeze right upper lung field. Cardio: Regular rate and rhythm. GI: No hepatomegaly. Vascular: Trace edema left lower leg. Neuro: Alert and oriented.    Lab Results:  Lab Results  Component Value Date   WBC 9.1 06/14/2020   HGB 12.9 06/14/2020   HCT 37.7 06/14/2020   MCV 84.3 06/14/2020   PLT 488 (H) 06/14/2020   NEUTROABS 6.9 06/14/2020    Imaging:  DG Chest 1 View  Result Date: 06/14/2020 CLINICAL DATA:  Pleural effusion. Small cell lung cancer. Status post right thoracentesis. EXAM: CHEST  1 VIEW COMPARISON:  06/07/2020 FINDINGS: The cardiomediastinal silhouette is unchanged. Aortic atherosclerosis is noted. Extensive right upper lobe density corresponding to a known large mass and surrounding airspace opacity is unchanged. A moderate right pleural effusion is stable to minimally smaller than on the prior study. No pneumothorax is identified. No acute osseous abnormality is seen. IMPRESSION: No pneumothorax following thoracentesis. Moderate residual right pleural effusion. Unchanged right upper lobe density corresponding to known mass and surrounding airspace opacity. Electronically Signed   By: Logan Bores M.D.   On: 06/14/2020 10:04    Medications: I have reviewed the patient's  current medications.  Assessment/Plan: 1. Right lung mass concerning for malignancy -05/15/2020-CT chest with contrast showed a 7.2 x 5.9 cm right upper lobe lung mass with invasion into the right hilum and mediastinum, extensive mediastinal and right hilar lymphadenopathy, extensive interstitial spread of tumor in the right lung and large loculated right pleural fluid collection likely secondary to pleural metastatic disease, scattered pulmonary nodules in the left lung suspicious for metastatic disease, 13 x 10.5 mm spiculated lesion in the lingula which could be a second primary. -Cytology on pleural fluid 11/23/2021positivefor malignant cells;cluster of cells positive forMOC31, TTF-1,synaptophysinand CD 56; possibility of small cell carcinoma raised -Cytology on pleural fluid 05/26/2020-malignant cells present with immunophenotype raising possibility of small cell carcinoma -Biopsy right supraclavicular lymph node 06/09/2020-small cell carcinoma -Cycle 1 modified Carboplatin/Etoposide plus atezolizumab 06/14/2020 2. Pleural effusion-likely malignant 3.Postobstructive pneumonia? 4. Hyponatremia 5. Mild normocytic anemia 6. Thrombocytosis 7. Hypertension 8. AAA status post repair in 2009 9. PAD 10. Hyperlipidemia 11. Depression  12. Anxiety 13.Hyponatremia 14.  MGUS  Disposition: Mary Merritt appears unchanged.  Biopsy of the right supraclavicular lymph node confirms small cell carcinoma.  We reviewed the diagnosis with Mary Merritt and her daughter at today's appointment.  Dr. Benay Spice recommends proceeding with Carboplatin/Etoposide as we had previously discussed.  Additionally Dr. Benay Spice recommends adding atezolizumab.  We reviewed potential toxicities associated with atezolizumab including dermatologic toxicity, hepatitis, cardiotoxicity, endocrinopathies, colitis, neurologic toxicity, arthritis, pneumonitis.  She agrees to proceed as outlined above.  Plan for cycle 1  modified Carboplatin/Etoposide plus atezolizumab beginning today.  We reviewed the CBC and chemistry panel from today.  Labs adequate to  proceed with treatment.  She will return for lab and follow-up on 06/22/2020.  Patient seen with Dr. Benay Spice.  Ned Card ANP/GNP-BC   06/14/2020  12:37 PM This was a shared visit with Ned Card.  Mary Merritt has been diagnosed with small cell lung cancer.  I discussed treatment options with Mary Merritt and her daughter again today.  She would like to proceed with systemic therapy.  We reviewed potential toxicities associated with the recommended chemotherapy and immunotherapy regimen.  The chemotherapy will be dose attenuated based on her age, renal function, and performance status.  She will return for an office visit and nadir CBC.  Julieanne Manson, MD

## 2020-06-15 ENCOUNTER — Inpatient Hospital Stay: Payer: Medicare Other

## 2020-06-15 ENCOUNTER — Other Ambulatory Visit: Payer: Self-pay

## 2020-06-15 ENCOUNTER — Telehealth: Payer: Self-pay | Admitting: Nurse Practitioner

## 2020-06-15 VITALS — BP 131/48 | HR 81 | Temp 97.6°F | Resp 20

## 2020-06-15 DIAGNOSIS — C3490 Malignant neoplasm of unspecified part of unspecified bronchus or lung: Secondary | ICD-10-CM

## 2020-06-15 DIAGNOSIS — Z5112 Encounter for antineoplastic immunotherapy: Secondary | ICD-10-CM | POA: Diagnosis not present

## 2020-06-15 MED ORDER — SODIUM CHLORIDE 0.9 % IV SOLN
10.0000 mg | Freq: Once | INTRAVENOUS | Status: AC
  Administered 2020-06-15: 15:00:00 10 mg via INTRAVENOUS
  Filled 2020-06-15: qty 10

## 2020-06-15 MED ORDER — SODIUM CHLORIDE 0.9 % IV SOLN
60.0000 mg/m2 | Freq: Once | INTRAVENOUS | Status: AC
  Administered 2020-06-15: 16:00:00 110 mg via INTRAVENOUS
  Filled 2020-06-15: qty 5.5

## 2020-06-15 MED ORDER — SODIUM CHLORIDE 0.9 % IV SOLN
Freq: Once | INTRAVENOUS | Status: AC
  Filled 2020-06-15: qty 250

## 2020-06-15 NOTE — Patient Instructions (Signed)
[facility] Discharge Instructions for Patients Receiving Chemotherapy  Today you received the following chemotherapy agents etoposide   To help prevent nausea and vomiting after your treatment, we encourage you to take your nausea medication as directed  If you develop nausea and vomiting that is not controlled by your nausea medication, call the clinic.   BELOW ARE SYMPTOMS THAT SHOULD BE REPORTED IMMEDIATELY:  *FEVER GREATER THAN 100.5 F  *CHILLS WITH OR WITHOUT FEVER  NAUSEA AND VOMITING THAT IS NOT CONTROLLED WITH YOUR NAUSEA MEDICATION  *UNUSUAL SHORTNESS OF BREATH  *UNUSUAL BRUISING OR BLEEDING  TENDERNESS IN MOUTH AND THROAT WITH OR WITHOUT PRESENCE OF ULCERS  *URINARY PROBLEMS  *BOWEL PROBLEMS  UNUSUAL RASH Items with * indicate a potential emergency and should be followed up as soon as possible.  Feel free to call the clinic you have any questions or concerns. The clinic phone number is [phone].

## 2020-06-15 NOTE — Telephone Encounter (Signed)
Scheduled appointments per 12/22 los. Will have updated calendar printed for patient at today's visit.

## 2020-06-16 ENCOUNTER — Encounter (HOSPITAL_COMMUNITY): Payer: Self-pay | Admitting: Obstetrics and Gynecology

## 2020-06-16 ENCOUNTER — Emergency Department (HOSPITAL_COMMUNITY): Payer: Medicare Other

## 2020-06-16 ENCOUNTER — Other Ambulatory Visit: Payer: Self-pay

## 2020-06-16 ENCOUNTER — Emergency Department (HOSPITAL_COMMUNITY)
Admission: EM | Admit: 2020-06-16 | Discharge: 2020-06-16 | Disposition: A | Discharge status: Home / Self Care | Payer: Medicare Other | Attending: Emergency Medicine | Admitting: Emergency Medicine

## 2020-06-16 DIAGNOSIS — Z85118 Personal history of other malignant neoplasm of bronchus and lung: Secondary | ICD-10-CM | POA: Diagnosis not present

## 2020-06-16 DIAGNOSIS — Z96643 Presence of artificial hip joint, bilateral: Secondary | ICD-10-CM | POA: Diagnosis not present

## 2020-06-16 DIAGNOSIS — I10 Essential (primary) hypertension: Secondary | ICD-10-CM | POA: Diagnosis not present

## 2020-06-16 DIAGNOSIS — Z79899 Other long term (current) drug therapy: Secondary | ICD-10-CM | POA: Diagnosis not present

## 2020-06-16 DIAGNOSIS — Z87891 Personal history of nicotine dependence: Secondary | ICD-10-CM | POA: Insufficient documentation

## 2020-06-16 DIAGNOSIS — K5900 Constipation, unspecified: Secondary | ICD-10-CM | POA: Insufficient documentation

## 2020-06-16 DIAGNOSIS — Z7982 Long term (current) use of aspirin: Secondary | ICD-10-CM | POA: Insufficient documentation

## 2020-06-16 LAB — LIPASE, BLOOD: Lipase: 34 U/L (ref 11–51)

## 2020-06-16 MED ORDER — FLEET ENEMA 7-19 GM/118ML RE ENEM
1.0000 | ENEMA | Freq: Once | RECTAL | Status: AC
  Administered 2020-06-16: 20:00:00 1 via RECTAL
  Filled 2020-06-16: qty 1

## 2020-06-16 NOTE — ED Provider Notes (Signed)
[facility] DEPT Provider Note   CSN: [id number] Arrival date & time: 06/16/20  1728     History Chief Complaint  Patient presents with   Abdominal Pain    Mary Merritt is a 86 y.o. female hx of AAA, HTN, here presenting with constipation. Patient states that she started chemo this week for small cell lung cancer. She hasn't had bowel movements for 3 days. She tried stool softeners and suppositories with no relief. Patient denies abdominal pain or vomiting or fevers. Patient was sent in by her doctor to rule out obstruction or ileus.   The history is provided by the patient.       Past Medical History:  Diagnosis Date   AAA (abdominal aortic aneurysm) (HCC)    Anxiety    Arthritis    Dyspnea    Dysrhythmia    bradycardia   History of bronchitis    Hyperlipidemia    Hypertension    Seasonal allergies     Patient Active Problem List   Diagnosis Date Noted   Small cell lung cancer ([place]) 06/05/2020   Goals of care, counseling/discussion 06/05/2020   Abnormal chest x-ray    Pneumonia 05/14/2020   Pain in right foot 11/17/2019   Hyponatremia 11/22/2017   Nondisplaced fracture of left tibial spine, subsequent encounter for closed fracture with routine healing 08/05/2017   Chronic pain of left knee 07/21/2017   Presence of left artificial hip joint 05/30/2016   Hip joint replacement status 03/27/2016   S/P hip replacement 07/27/2015   Osteoarthritis of right hip 08/01/2014   Essential hypertension 05/31/2014   Mixed hyperlipidemia 05/31/2014   S/P AAA repair 05/31/2014   Bradycardia 05/31/2014   Murmur 05/31/2014    Past Surgical History:  Procedure Laterality Date   ABDOMINAL AORTIC ANEURYSM REPAIR     2009, Dr. Scot Dock   ABDOMINAL AORTOGRAM W/LOWER EXTREMITY Right 04/07/2020   Procedure: ABDOMINAL AORTOGRAM W/LOWER EXTREMITY;  Surgeon: Angelia Mould, MD;  Location: [facility];  Service:  Cardiovascular;  Laterality: Right;   CARDIAC CATHETERIZATION     patient thinks it was in 09 with dr Scot Dock   cataracts removed     INCONTINENCE SURGERY     IR THORACENTESIS ASP PLEURAL SPACE W/IMG GUIDE  06/07/2020   IR THORACENTESIS ASP PLEURAL SPACE W/IMG GUIDE  06/14/2020   THORACENTESIS N/A 05/16/2020   Procedure: Mathews Robinsons;  Surgeon: Lanier Clam, MD;  Location: [facility];  Service: Pulmonary;  Laterality: N/A;   TOTAL HIP ARTHROPLASTY Right 08/01/2014   Procedure: RIGHT TOTAL HIP ARTHROPLASTY ANTERIOR APPROACH;  Surgeon: Marianna Payment, MD;  Location: [facility];  Service: Orthopedics;  Laterality: Right;   TOTAL HIP ARTHROPLASTY Left 03/27/2016   Procedure: LEFT TOTAL HIP ARTHROPLASTY ANTERIOR APPROACH;  Surgeon: Leandrew Koyanagi, MD;  Location: [facility];  Service: Orthopedics;  Laterality: Left;     OB History   No obstetric history on file.     Family History  Problem Relation Age of Onset   Heart failure Father    Stroke Mother     Social History   Tobacco Use   Smoking status: Former Smoker    Packs/day: 1.00    Years: 30.00    Pack years: 30.00    Quit date: 05/31/1984    Years since quitting: 36.0   Smokeless tobacco: Never Used  Vaping Use   Vaping Use: Never used  Substance Use Topics   Alcohol use: No  Alcohol/week: 0.0 standard drinks   Drug use: No    Home Medications Prior to Admission medications   Medication Sig Start Date End Date Taking? Authorizing Provider  acetaminophen (TYLENOL) 650 MG CR tablet Take 650 mg by mouth daily as needed for pain (headache).    [provider]  albuterol (VENTOLIN HFA) 108 (90 Base) MCG/ACT inhaler Inhale 2 puffs into the lungs every 4 (four) hours as needed for wheezing or shortness of breath.    [provider]  amLODipine (NORVASC) 10 MG tablet Take 1 tablet by mouth once daily Patient taking differently: Take 10 mg by mouth daily. 02/29/20   Pixie Casino, MD   aspirin EC 81 MG tablet Take 81 mg by mouth daily.    [provider]  cetirizine (ZYRTEC) 10 MG tablet Take 10 mg by mouth at bedtime.    [provider]  Cholecalciferol (VITAMIN D3 PO) Take 1 tablet by mouth at bedtime.    [provider]  escitalopram (LEXAPRO) 10 MG tablet Take 10 mg by mouth daily.    [provider]  ketotifen (ZADITOR) 0.025 % ophthalmic solution Place 1 drop into both eyes daily.    [provider]  levocetirizine (XYZAL) 5 MG tablet Take 5 mg by mouth at bedtime.    [provider]  losartan (COZAAR) 50 MG tablet TAKE 1 TABLET BY MOUTH ONCE DAILY Patient taking differently: Take 50 mg by mouth at bedtime. 07/24/18   Hilty, Nadean Corwin, MD  lovastatin (MEVACOR) 20 MG tablet TAKE 1 TABLET BY MOUTH AT BEDTIME Patient taking differently: Take 20 mg by mouth at bedtime. 08/18/19   Hilty, Nadean Corwin, MD  Magnesium 250 MG TABS Take 250 mg by mouth every other day.    [provider]  melatonin 3 MG TABS tablet Take 1 tablet (3 mg total) by mouth at bedtime. 05/16/20   Armando Reichert, MD  metoprolol tartrate (LOPRESSOR) 25 MG tablet Take 0.5 tablets (12.5 mg total) by mouth 2 (two) times daily. 12/27/19   Hilty, Nadean Corwin, MD  Omega-3 Fatty Acids (FISH OIL) 1000 MG CAPS Take 2,000 mg by mouth at bedtime.     [provider]  ondansetron (ZOFRAN) 8 MG tablet Take 1 tablet (8 mg total) by mouth every 8 (eight) hours as needed for nausea or vomiting. Begin 72 hours after day 1 IV infusion. 06/08/20   Ladell Pier, MD  prochlorperazine (COMPAZINE) 5 MG tablet Take 1 tablet (5 mg total) by mouth every 6 (six) hours as needed for nausea or vomiting. 06/08/20   Ladell Pier, MD    Allergies    Percocet [oxycodone-acetaminophen]  Review of Systems   Review of Systems  Gastrointestinal: Positive for abdominal pain.  All other systems reviewed and are negative.   Physical Exam Updated Vital Signs BP 138/65  (BP Location: Right Arm)    Pulse 77    Temp 97.9 F (36.6 C) (Oral)    Resp 16    SpO2 91%   Physical Exam Vitals and nursing note reviewed.  Constitutional:      Comments: Chronically ill, NAD   HENT:     Head: Normocephalic.  Eyes:     Extraocular Movements: Extraocular movements intact.  Cardiovascular:     Rate and Rhythm: Normal rate and regular rhythm.     Heart sounds: Normal heart sounds.  Pulmonary:     Effort: Pulmonary effort is normal.     Breath sounds: Normal breath  sounds.  Abdominal:     General: Abdomen is flat.     Comments: nontender   Genitourinary:    Comments: Rectal- no obvious stool impaction  Skin:    General: Skin is warm.     Capillary Refill: Capillary refill takes less than 2 seconds.  Neurological:     General: No focal deficit present.     Mental Status: She is alert and oriented to person, place, and time.  Psychiatric:        Mood and Affect: Mood normal.        Behavior: Behavior normal.     ED Results / Procedures / Treatments   Labs (all labs ordered are listed, but only abnormal results are displayed) Labs Reviewed  LIPASE, BLOOD    EKG None  Radiology DG ABD ACUTE 2+V W 1V CHEST  Result Date: 06/16/2020 CLINICAL DATA:  Constipation.  History of lung cancer. EXAM: DG ABDOMEN ACUTE WITH 1 VIEW CHEST COMPARISON:  Chest x-ray dated 06/14/2020 FINDINGS: Diffuse airspace opacification is again noted throughout the right lung field. There is a right-sided pleural effusion. There is right-sided volume loss. Aortic calcifications are noted. The heart size is stable. There is no pneumothorax. There is a large amount of stool throughout the colon. The bowel gas pattern is nonobstructive. There are degenerative changes throughout the lumbar spine. The patient is status post prior total hip arthroplasty bilaterally. IMPRESSION: 1. Nonobstructive bowel gas pattern. 2. Large amount of stool throughout the colon. 3. Stable appearance of the  chest. Electronically Signed   By: Constance Holster M.D.   On: 06/16/2020 18:35    Procedures Procedures (including critical care time)  Medications Ordered in ED Medications  sodium phosphate (FLEET) 7-19 GM/118ML enema 1 enema (1 enema Rectal Given 06/16/20 2014)    ED Course  I have reviewed the triage vital signs and the nursing notes.  Pertinent labs & imaging results that were available during my care of the patient were reviewed by me and considered in my medical decision making (see chart for details).    MDM Rules/Calculators/A&P                         Mary Merritt is a 86 y.o. female here presenting with constipation. Patient has constipation for 3 days, no vomiting or fever. Had labs 3 days ago that were baseline and her potassium was normal in particular. Abdomen nontender. Rectal exam showed no obvious stool impaction. Will get xrays and give enema.   8:38 PM Xray showed moderate stool. Given enema and had a bowel movement. Stable for discharge   Final Clinical Impression(s) / ED Diagnoses Final diagnoses:  None    Rx / DC Orders ED Discharge Orders    None       Drenda Freeze, MD 06/16/20 2039

## 2020-06-16 NOTE — Discharge Instructions (Signed)
Take miralax twice daily for a week for constipation   See your doctor   Return to ER if you have abdominal pain, vomiting, fever

## 2020-06-16 NOTE — ED Notes (Signed)
Pt to xray via stretcher

## 2020-06-16 NOTE — ED Triage Notes (Signed)
Patient reports to the ER for constipation. Patient reports she has not had a BM x3 days and she just started Chemo this past week. Patient reports she is fluid restricted and has not had as much of an appetite. Patient also endorses utilization of suppositories and stool softeners with no  Relief. Patient denies abdominal pain.

## 2020-06-19 ENCOUNTER — Telehealth: Payer: Self-pay | Admitting: *Deleted

## 2020-06-19 ENCOUNTER — Other Ambulatory Visit: Payer: Self-pay | Admitting: Medical

## 2020-06-19 DIAGNOSIS — J90 Pleural effusion, not elsewhere classified: Secondary | ICD-10-CM

## 2020-06-19 DIAGNOSIS — C3490 Malignant neoplasm of unspecified part of unspecified bronchus or lung: Secondary | ICD-10-CM

## 2020-06-19 NOTE — Progress Notes (Signed)
IMG

## 2020-06-19 NOTE — Telephone Encounter (Signed)
-----   Message from Lin Givens, RN sent at 06/15/2020 11:13 AM EST ----- Regarding: FW: First Chemo Dr. Benay Spice Call back today since she received Etoposide on Thursday ----- Message ----- From: Priscille Loveless, RN Sent: 06/14/2020   4:42 PM EST To: Onc Triage Nurse [facility] Subject: First Chemo Dr. Benay Spice                       First chemo follow up [facility]; Carboplatin; etoposide

## 2020-06-19 NOTE — Telephone Encounter (Signed)
Called pt to see how she did with her treatment last week.  She reports doing well except constipation & went to ER Christmas Eve per MD instructions.  She didn't have a blockage & was given an enema & suggested to start miralax twice daily.  She is taking miralax now & encouraged to continue unless she has diarrhea.  Informed she could decrease to daily if that works for her.  Also suggested that if she stops, she may want to start back on before her next treatment as precaution.  She expressed understanding & states she knows how to reach [place] if needed.

## 2020-06-19 NOTE — Telephone Encounter (Signed)
Patient is getting short of breath again and is requesting a thoracentesis this week. OK per Dr. Benay Spice.  Thoracentesis scheduled for 12/29 at 0945/1000 at [facility] with COVID test on 12/28 at 10:30 at [facility]. Left appointment information on voice mail of daughter, Jocelyn Lamer and son, [name].

## 2020-06-20 ENCOUNTER — Other Ambulatory Visit (HOSPITAL_COMMUNITY)
Admission: RE | Admit: 2020-06-20 | Discharge: 2020-06-20 | Disposition: A | Discharge status: Home / Self Care | Payer: Medicare Other | Source: Ambulatory Visit | Attending: Medical | Admitting: Medical

## 2020-06-20 DIAGNOSIS — J189 Pneumonia, unspecified organism: Secondary | ICD-10-CM | POA: Diagnosis not present

## 2020-06-20 DIAGNOSIS — Z20822 Contact with and (suspected) exposure to covid-19: Secondary | ICD-10-CM | POA: Diagnosis not present

## 2020-06-20 DIAGNOSIS — J90 Pleural effusion, not elsewhere classified: Secondary | ICD-10-CM | POA: Diagnosis not present

## 2020-06-20 DIAGNOSIS — Z9889 Other specified postprocedural states: Secondary | ICD-10-CM | POA: Diagnosis not present

## 2020-06-20 DIAGNOSIS — Z85118 Personal history of other malignant neoplasm of bronchus and lung: Secondary | ICD-10-CM | POA: Diagnosis not present

## 2020-06-20 DIAGNOSIS — I700 Atherosclerosis of aorta: Secondary | ICD-10-CM | POA: Diagnosis not present

## 2020-06-21 ENCOUNTER — Ambulatory Visit (HOSPITAL_COMMUNITY)
Admission: RE | Admit: 2020-06-21 | Discharge: 2020-06-21 | Disposition: A | Discharge status: Home / Self Care | Payer: Medicare Other | Source: Ambulatory Visit | Attending: Medical | Admitting: Medical

## 2020-06-21 ENCOUNTER — Ambulatory Visit (HOSPITAL_COMMUNITY)
Admission: RE | Admit: 2020-06-21 | Discharge: 2020-06-21 | Disposition: A | Discharge status: Home / Self Care | Payer: Medicare Other | Source: Ambulatory Visit | Attending: Student | Admitting: Student

## 2020-06-21 ENCOUNTER — Other Ambulatory Visit: Payer: Self-pay

## 2020-06-21 DIAGNOSIS — J90 Pleural effusion, not elsewhere classified: Secondary | ICD-10-CM | POA: Diagnosis not present

## 2020-06-21 DIAGNOSIS — Z20822 Contact with and (suspected) exposure to covid-19: Secondary | ICD-10-CM | POA: Insufficient documentation

## 2020-06-21 DIAGNOSIS — Z9889 Other specified postprocedural states: Secondary | ICD-10-CM | POA: Insufficient documentation

## 2020-06-21 DIAGNOSIS — J189 Pneumonia, unspecified organism: Secondary | ICD-10-CM | POA: Insufficient documentation

## 2020-06-21 DIAGNOSIS — I700 Atherosclerosis of aorta: Secondary | ICD-10-CM | POA: Insufficient documentation

## 2020-06-21 DIAGNOSIS — Z85118 Personal history of other malignant neoplasm of bronchus and lung: Secondary | ICD-10-CM | POA: Insufficient documentation

## 2020-06-21 LAB — SARS CORONAVIRUS 2 (TAT 6-24 HRS): SARS Coronavirus 2: NEGATIVE

## 2020-06-21 MED ORDER — LIDOCAINE HCL 1 % IJ SOLN
INTRAMUSCULAR | Status: AC
  Filled 2020-06-21: qty 20

## 2020-06-21 NOTE — Procedures (Signed)
PROCEDURE SUMMARY:  Successful US guided right thoracentesis. Yielded 1 L of amber-colored fluid. Pt tolerated procedure well. No immediate complications.  CXR ordered; no post-procedure pneumothorax identified  EBL < 2  mL  Theresa Duty, N P 06/21/2020 11:14 AM

## 2020-06-22 ENCOUNTER — Encounter: Payer: Self-pay | Admitting: Nurse Practitioner

## 2020-06-22 ENCOUNTER — Other Ambulatory Visit: Payer: Self-pay

## 2020-06-22 ENCOUNTER — Telehealth: Payer: Self-pay | Admitting: Nurse Practitioner

## 2020-06-22 ENCOUNTER — Inpatient Hospital Stay: Payer: Medicare Other

## 2020-06-22 ENCOUNTER — Other Ambulatory Visit: Payer: Self-pay | Admitting: Nurse Practitioner

## 2020-06-22 ENCOUNTER — Inpatient Hospital Stay (HOSPITAL_BASED_OUTPATIENT_CLINIC_OR_DEPARTMENT_OTHER): Payer: Medicare Other | Admitting: Nurse Practitioner

## 2020-06-22 VITALS — BP 125/55 | HR 74 | Temp 97.5°F | Resp 19 | Wt 146.8 lb

## 2020-06-22 DIAGNOSIS — C3490 Malignant neoplasm of unspecified part of unspecified bronchus or lung: Secondary | ICD-10-CM | POA: Diagnosis not present

## 2020-06-22 DIAGNOSIS — Z5112 Encounter for antineoplastic immunotherapy: Secondary | ICD-10-CM | POA: Diagnosis not present

## 2020-06-22 LAB — CMP (CANCER CENTER ONLY)
ALT: 17 U/L (ref 0–44)
AST: 20 U/L (ref 15–41)
Albumin: 2.8 g/dL — ABNORMAL LOW (ref 3.5–5.0)
Alkaline Phosphatase: 73 U/L (ref 38–126)
Anion gap: 7 (ref 5–15)
BUN: 13 mg/dL (ref 8–23)
CO2: 28 mmol/L (ref 22–32)
Calcium: 9.2 mg/dL (ref 8.9–10.3)
Chloride: 89 mmol/L — ABNORMAL LOW (ref 98–111)
Creatinine: 0.67 mg/dL (ref 0.44–1.00)
GFR, Estimated: >60 mL/min (ref >60)
Glucose, Bld: 107 mg/dL — ABNORMAL HIGH (ref 70–99)
Potassium: 4.3 mmol/L (ref 3.5–5.1)
Sodium: 124 mmol/L — ABNORMAL LOW (ref 135–145)
Total Bilirubin: 0.4 mg/dL (ref 0.3–1.2)
Total Protein: 6.6 g/dL (ref 6.5–8.1)

## 2020-06-22 LAB — CBC WITH DIFFERENTIAL (CANCER CENTER ONLY)
Abs Immature Granulocytes: 0.05 10*3/uL (ref 0.00–0.07)
Basophils Absolute: 0 10*3/uL (ref 0.0–0.1)
Basophils Relative: 0 %
Eosinophils Absolute: 0.1 10*3/uL (ref 0.0–0.5)
Eosinophils Relative: 1 %
HCT: 37.9 % (ref 36.0–46.0)
Hemoglobin: 12.9 g/dL (ref 12.0–15.0)
Immature Granulocytes: 1 %
Lymphocytes Relative: 14 %
Lymphs Abs: 0.8 10*3/uL (ref 0.7–4.0)
MCH: 29.1 pg (ref 26.0–34.0)
MCHC: 34 g/dL (ref 30.0–36.0)
MCV: 85.4 fL (ref 80.0–100.0)
Monocytes Absolute: 0.2 10*3/uL (ref 0.1–1.0)
Monocytes Relative: 3 %
Neutro Abs: 4.5 10*3/uL (ref 1.7–7.7)
Neutrophils Relative %: 81 %
Platelet Count: 258 10*3/uL (ref 150–400)
RBC: 4.44 MIL/uL (ref 3.87–5.11)
RDW: 13.5 % (ref 11.5–15.5)
WBC Count: 5.6 10*3/uL (ref 4.0–10.5)
nRBC: 0 % (ref 0.0–0.2)

## 2020-06-22 NOTE — Telephone Encounter (Signed)
I contacted Mary Merritt regarding the sodium level of 124.  She will continue the fluid restriction as previously discussed and come to the office for repeat labs in 1 week.  She expressed understanding.

## 2020-06-22 NOTE — Telephone Encounter (Signed)
Left message with lab appointment per 12/30 schedule message. Gave option to call back to reschedule if needed.

## 2020-06-22 NOTE — Progress Notes (Signed)
Met with patient's son at registration to obtain signature for grant.  Patient approved for one-time $1000 [employer] grant to assist with personal expenses while going through treatment. Discussed expenses and how they are covered. Gave a copy of the approval letter and expense sheet along with the Outpatient pharmacy information. They received a gift card today.  He has my card for any additional financial questions or concerns.

## 2020-06-22 NOTE — Progress Notes (Signed)
[facility] OFFICE PROGRESS NOTE   Diagnosis: Small cell lung cancer  INTERVAL HISTORY:   Mary Merritt returns as scheduled.  She completed cycle 1 modified Carboplatin/Etoposide/atezolizumab beginning 06/14/2020.  She underwent a thoracentesis yesterday with 1 L of fluid removed.  Dyspnea is better but she still become short of breath with exertion.  She has an occasional cough.  No fever.  She feels she tolerated the chemotherapy well overall.  No nausea or vomiting.  No mouth sores.  She had some constipation, now resolved.  No rash or diarrhea.  Objective:  Vital signs in last 24 hours:  Blood pressure (!) 125/55, pulse 74, temperature (!) 97.5 F (36.4 C), temperature source Tympanic, resp. rate 19, weight 146 lb 12.8 oz (66.6 kg), SpO2 96 %.    HEENT: No thrush or ulcers. Resp: Breath sounds diminished right lung field.  Faint rales right lung base.  No respiratory distress. Cardio: Regular, occasional premature beat. GI: No hepatosplenomegaly. Vascular: Trace edema left lower leg. Neuro: Alert and oriented. Skin: Ecchymosis left lateral lower leg.   Lab Results:  Lab Results  Component Value Date   WBC 5.6 06/22/2020   HGB 12.9 06/22/2020   HCT 37.9 06/22/2020   MCV 85.4 06/22/2020   PLT 258 06/22/2020   NEUTROABS 4.5 06/22/2020    Imaging:  DG Chest 1 View  Result Date: 06/21/2020 CLINICAL DATA:  Status post thoracentesis EXAM: CHEST  1 VIEW COMPARISON:  June 14, 2020 chest radiograph and chest CT May 16, 2019 FINDINGS: No pneumothorax. Small residual pleural effusion on the right. There is persistent airspace opacity throughout the right upper lobe. Patchy areas of airspace opacity noted in the left mid lung and left base. Heart size and pulmonary vascular normal. No adenopathy. There is aortic atherosclerosis. No bone lesions. IMPRESSION: No pneumothorax. Airspace opacity persists in the right upper lobe. The known mass in the right upper  lobe is not well delineated given the surrounding apparent pneumonitis. Small residual right pleural effusion with right base atelectasis. New ill-defined airspace opacity left mid lung and left base may represent foci of developing pneumonia. Stable cardiac silhouette. Aortic Atherosclerosis (ICD10-I70.0). Electronically Signed   By: Lowella Grip III M.D.   On: 06/21/2020 10:59   US Thoracentesis Asp Pleural space w/IMG guide  Result Date: 06/21/2020 INDICATION: Patient with a history of lung cancer and recurrent right pleural effusion. Interventional radiology asked to perform a therapeutic thoracentesis. EXAM: ULTRASOUND GUIDED THORACENTESIS MEDICATIONS: 1% lidocaine 10 mL COMPLICATIONS: None immediate. PROCEDURE: An ultrasound guided thoracentesis was thoroughly discussed with the patient and questions answered. The benefits, risks, alternatives and complications were also discussed. The patient understands and wishes to proceed with the procedure. Written consent was obtained. Ultrasound was performed to localize and mark an adequate pocket of fluid in the right chest. The area was then prepped and draped in the normal sterile fashion. 1% Lidocaine was used for local anesthesia. Under ultrasound guidance a 6 Fr Safe-T-Centesis catheter was introduced. Thoracentesis was performed. The catheter was removed and a dressing applied. FINDINGS: A total of approximately 1 L of amber-colored fluid was removed. IMPRESSION: Successful ultrasound guided right thoracentesis yielding 1 L of pleural fluid. Read by: Soyla Dryer, NP Electronically Signed   By: Jerilynn Mages.  Shick M.D.   On: 06/21/2020 11:13    Medications: I have reviewed the patient's current medications.  Assessment/Plan: 1. Right lung mass concerning for malignancy -05/15/2020-CT chest with contrast showed a 7.2 x 5.9 cm right upper  lobe lung mass with invasion into the right hilum and mediastinum, extensive mediastinal and right hilar  lymphadenopathy, extensive interstitial spread of tumor in the right lung and large loculated right pleural fluid collection likely secondary to pleural metastatic disease, scattered pulmonary nodules in the left lung suspicious for metastatic disease, 13 x 10.5 mm spiculated lesion in the lingula which could be a second primary. -Cytology on pleural fluid 11/23/2021positivefor malignant cells;cluster of cells positive forMOC31, TTF-1,synaptophysinand CD 56; possibility of small cell carcinoma raised -Cytology on pleural fluid 05/26/2020-malignant cells present with immunophenotype raising possibility of small cell carcinoma -Biopsy right supraclavicular lymph node 06/09/2020-small cell carcinoma -Cycle 1 modified Carboplatin/Etoposide plus atezolizumab 06/14/2020 2. Pleural effusion-malignant 3.Postobstructive pneumonia? 4. Hyponatremia 5. Mild normocytic anemia 6. Thrombocytosis 7. Hypertension 8. AAA status post repair in 2009 9. PAD 10. Hyperlipidemia 11. Depression  12. Anxiety 13.Hyponatremia 14.  MGUS  Disposition: Mary Merritt appears stable.  She completed cycle 1 modified Carboplatin/Etoposide plus atezolizumab 06/14/2020.  She seems to have tolerated well.  Plan for next cycle at a 4-week interval.  We reviewed the CBC from today.  Blood counts are stable.  She will contact the office if she needs another thoracentesis prior to her next visit.  She will return for lab, follow-up, cycle 2 Carboplatin/Etoposide plus atezolizumab 07/11/2020.  Patient seen with Dr. Benay Spice.    Ned Card ANP/GNP-BC   06/22/2020  11:39 AM  This was a shared visit with Ned Card. Mary Merritt was interviewed and examined. Mary Merritt appears stable. She tolerated the first cycle of chemotherapy without significant acute toxicity. Hopefully the pleural effusion and respiratory symptoms will improve over the next few weeks.  Julieanne Manson, MD

## 2020-06-23 LAB — KAPPA/LAMBDA LIGHT CHAINS
Kappa free light chain: 20 mg/L — ABNORMAL HIGH (ref 3.3–19.4)
Kappa, lambda light chain ratio: 1.17 (ref 0.26–1.65)
Lambda free light chains: 17.1 mg/L (ref 5.7–26.3)

## 2020-06-26 ENCOUNTER — Telehealth: Payer: Self-pay | Admitting: Nurse Practitioner

## 2020-06-26 LAB — PROTEIN ELECTROPHORESIS, SERUM
A/G Ratio: 1 (ref 0.7–1.7)
Albumin ELP: 3 g/dL (ref 2.9–4.4)
Alpha-1-Globulin: 0.4 g/dL (ref 0.0–0.4)
Alpha-2-Globulin: 1.1 g/dL — ABNORMAL HIGH (ref 0.4–1.0)
Beta Globulin: 1.1 g/dL (ref 0.7–1.3)
Gamma Globulin: 0.5 g/dL (ref 0.4–1.8)
Globulin, Total: 3.1 g/dL (ref 2.2–3.9)
Total Protein ELP: 6.1 g/dL (ref 6.0–8.5)

## 2020-06-26 NOTE — Telephone Encounter (Signed)
Scheduled appointments per 12/30 los. Spoke to patient who is aware of appointments dates and times.

## 2020-06-29 ENCOUNTER — Other Ambulatory Visit: Payer: Self-pay

## 2020-06-29 ENCOUNTER — Inpatient Hospital Stay: Payer: Medicare Other | Attending: Nurse Practitioner

## 2020-06-29 DIAGNOSIS — J910 Malignant pleural effusion: Secondary | ICD-10-CM | POA: Diagnosis not present

## 2020-06-29 DIAGNOSIS — R0609 Other forms of dyspnea: Secondary | ICD-10-CM | POA: Insufficient documentation

## 2020-06-29 DIAGNOSIS — C3411 Malignant neoplasm of upper lobe, right bronchus or lung: Secondary | ICD-10-CM | POA: Insufficient documentation

## 2020-06-29 DIAGNOSIS — I4891 Unspecified atrial fibrillation: Secondary | ICD-10-CM | POA: Diagnosis not present

## 2020-06-29 DIAGNOSIS — C3490 Malignant neoplasm of unspecified part of unspecified bronchus or lung: Secondary | ICD-10-CM

## 2020-06-29 LAB — BASIC METABOLIC PANEL - CANCER CENTER ONLY
Anion gap: 8 (ref 5–15)
BUN: 10 mg/dL (ref 8–23)
CO2: 24 mmol/L (ref 22–32)
Calcium: 9.3 mg/dL (ref 8.9–10.3)
Chloride: 93 mmol/L — ABNORMAL LOW (ref 98–111)
Creatinine: 0.7 mg/dL (ref 0.44–1.00)
GFR, Estimated: >60 mL/min (ref >60)
Glucose, Bld: 110 mg/dL — ABNORMAL HIGH (ref 70–99)
Potassium: 4.4 mmol/L (ref 3.5–5.1)
Sodium: 125 mmol/L — ABNORMAL LOW (ref 135–145)

## 2020-07-01 ENCOUNTER — Other Ambulatory Visit: Payer: Self-pay

## 2020-07-01 ENCOUNTER — Encounter (HOSPITAL_COMMUNITY): Payer: Self-pay | Admitting: *Deleted

## 2020-07-01 ENCOUNTER — Emergency Department (HOSPITAL_COMMUNITY)
Admission: EM | Admit: 2020-07-01 | Discharge: 2020-07-01 | Disposition: A | Discharge status: Home / Self Care | Payer: Medicare Other | Attending: Emergency Medicine | Admitting: Emergency Medicine

## 2020-07-01 ENCOUNTER — Emergency Department (HOSPITAL_COMMUNITY): Payer: Medicare Other

## 2020-07-01 DIAGNOSIS — I10 Essential (primary) hypertension: Secondary | ICD-10-CM | POA: Insufficient documentation

## 2020-07-01 DIAGNOSIS — Z79899 Other long term (current) drug therapy: Secondary | ICD-10-CM | POA: Diagnosis not present

## 2020-07-01 DIAGNOSIS — J90 Pleural effusion, not elsewhere classified: Secondary | ICD-10-CM

## 2020-07-01 DIAGNOSIS — Z87891 Personal history of nicotine dependence: Secondary | ICD-10-CM | POA: Diagnosis not present

## 2020-07-01 DIAGNOSIS — Z7982 Long term (current) use of aspirin: Secondary | ICD-10-CM | POA: Insufficient documentation

## 2020-07-01 DIAGNOSIS — Z85118 Personal history of other malignant neoplasm of bronchus and lung: Secondary | ICD-10-CM | POA: Insufficient documentation

## 2020-07-01 DIAGNOSIS — J918 Pleural effusion in other conditions classified elsewhere: Secondary | ICD-10-CM | POA: Diagnosis not present

## 2020-07-01 DIAGNOSIS — Z96641 Presence of right artificial hip joint: Secondary | ICD-10-CM | POA: Insufficient documentation

## 2020-07-01 DIAGNOSIS — Z20822 Contact with and (suspected) exposure to covid-19: Secondary | ICD-10-CM | POA: Diagnosis not present

## 2020-07-01 DIAGNOSIS — Z96642 Presence of left artificial hip joint: Secondary | ICD-10-CM | POA: Insufficient documentation

## 2020-07-01 DIAGNOSIS — R0602 Shortness of breath: Secondary | ICD-10-CM | POA: Insufficient documentation

## 2020-07-01 LAB — CBC
HCT: 35.3 % — ABNORMAL LOW (ref 36.0–46.0)
Hemoglobin: 12 g/dL (ref 12.0–15.0)
MCH: 29.3 pg (ref 26.0–34.0)
MCHC: 34 g/dL (ref 30.0–36.0)
MCV: 86.3 fL (ref 80.0–100.0)
Platelets: 459 10*3/uL — ABNORMAL HIGH (ref 150–400)
RBC: 4.09 MIL/uL (ref 3.87–5.11)
RDW: 14.5 % (ref 11.5–15.5)
WBC: 2.8 10*3/uL — ABNORMAL LOW (ref 4.0–10.5)
nRBC: 0 % (ref 0.0–0.2)

## 2020-07-01 LAB — BASIC METABOLIC PANEL
Anion gap: 12 (ref 5–15)
BUN: 14 mg/dL (ref 8–23)
CO2: 22 mmol/L (ref 22–32)
Calcium: 9 mg/dL (ref 8.9–10.3)
Chloride: 92 mmol/L — ABNORMAL LOW (ref 98–111)
Creatinine, Ser: 0.65 mg/dL (ref 0.44–1.00)
GFR, Estimated: >60 mL/min (ref >60)
Glucose, Bld: 157 mg/dL — ABNORMAL HIGH (ref 70–99)
Potassium: 4 mmol/L (ref 3.5–5.1)
Sodium: 126 mmol/L — ABNORMAL LOW (ref 135–145)

## 2020-07-01 MED ORDER — ALBUTEROL SULFATE HFA 108 (90 BASE) MCG/ACT IN AERS: 2.0000 | INHALATION_SPRAY | RESPIRATORY_TRACT | Status: DC | PRN

## 2020-07-01 MED ORDER — LIDOCAINE-EPINEPHRINE (PF) 2 %-1:200000 IJ SOLN
10.0000 mL | Freq: Once | INTRAMUSCULAR | Status: AC
  Administered 2020-07-01: 10 mL
  Filled 2020-07-01: qty 20

## 2020-07-01 NOTE — ED Provider Notes (Signed)
THORACENTESIS BEDSIDE  Date/Time: 07/01/2020 7:13 PM Performed by: Sherwood Gambler, MD Authorized by: Sherwood Gambler, MD   Consent:    Consent obtained:  Verbal and written   Consent given by:  Patient   Risks, benefits, and alternatives were discussed: yes     Risks discussed:  Bleeding, incomplete drainage, nerve damage, infection, pain and pneumothorax Universal protocol:    Patient identity confirmed:  Verbally with patient Sedation:    Sedation type:  None Anesthesia:    Anesthesia method:  Local infiltration   Local anesthetic:  Lidocaine 1% w/o epi Procedure details:    Preparation: Patient was prepped and draped in usual sterile fashion     Patient position:  Sitting   Location:  R midscapular line   Puncture method:  Over-the-needle catheter   Ultrasound guidance: yes     Indwelling catheter placed: yes     Catheter size:  8 Fr   Number of attempts:  1   Drainage characteristics:  Serosanguinous Post-procedure details:    Chest x-ray performed: yes     Chest x-ray findings:  Pleural effusion improved   Procedure completion:  Tolerated well, no immediate complications Comments:     ~1L of fluid removed   Patient with recurrent pleural effusion. Has gotten multiple therapeutic thoracentesis before. Is here for another. CXR with recurrent effusion. Unable to get IR due to time of day. Discussed with patient, and we performed bedside thoracentesis as above with good relief. She asked me to stop when it got to 1L as she started having some coughing. Clinically is improved. No pneumothorax on CXR. Has leukopenia but also recently got chemo. Doubt acutely infected fluid. Will d/c.   Sherwood Gambler, MD 07/01/20 [phone]

## 2020-07-01 NOTE — Discharge Instructions (Signed)
You are seen today for pleural effusion, we were able to drain 1 L of this today in the emergency department.  I wanted to follow-up with your primary care and your oncologist as scheduled.  Your COVID test will result in the next 12 hours, if this is positive please follow CDC guidelines and isolate and contact your PCP.  If you have any new or worsening concerning symptoms please come back to the emergency department.

## 2020-07-01 NOTE — ED Provider Notes (Signed)
[facility] DEPT Provider Note   CSN: [id number] Arrival date & time: 07/01/20  1359     History Chief Complaint  Patient presents with  . Cough  . Shortness of Breath  . Sore Throat    Mary Merritt is a 87 y.o. female with pertinent past medical history of small cell lung cancer currently being treated with chemotherapy followed by Dr.Sherill presents emerged department today for shortness of breath.  Patient has required therapeutic thoracentesis weekly, states that she has not had a thoracentesis for a week and a half now.  Normally gets this outpatient at her oncologist office.  Patient is present with mom who is able to provide most of history.  Patient states that last night she started having increased shortness of breath with a new cough.  Denies any chest pain.  Denies any hemoptysis or productive cough.  States that she was having difficulty breathing, felt as if there was fluid in her lungs therefore came to the emergency department today.  Patient recently had chemotherapy cycle on 22 December, recently had follow-up on December 30 with oncologist.  Patient denies any sick contacts.  Has been vaccinated against COVID and boosted.  Denies any fevers or chills.  Denies any congestion or sore throat.  Denies any nausea or vomiting or abdominal pain.  States that she lives alone at home.  No other complaints at this time.  They did not put a Pleur-evac in because oncologist stated that chemotherapy would help and patient would no longer require thoracentesis according to daughter.  HPI     Past Medical History:  Diagnosis Date  . AAA (abdominal aortic aneurysm) ([place])   . Anxiety   . Arthritis   . Dyspnea   . Dysrhythmia    bradycardia  . History of bronchitis   . Hyperlipidemia   . Hypertension   . Seasonal allergies     Patient Active Problem List   Diagnosis Date Noted  . Small cell lung cancer ([place]) 06/05/2020  . Goals of care,  counseling/discussion 06/05/2020  . Abnormal chest x-ray   . Pneumonia 05/14/2020  . Pain in right foot 11/17/2019  . Hyponatremia 11/22/2017  . Nondisplaced fracture of left tibial spine, subsequent encounter for closed fracture with routine healing 08/05/2017  . Chronic pain of left knee 07/21/2017  . Presence of left artificial hip joint 05/30/2016  . Hip joint replacement status 03/27/2016  . S/P hip replacement 07/27/2015  . Osteoarthritis of right hip 08/01/2014  . Essential hypertension 05/31/2014  . Mixed hyperlipidemia 05/31/2014  . S/P AAA repair 05/31/2014  . Bradycardia 05/31/2014  . Murmur 05/31/2014    Past Surgical History:  Procedure Laterality Date  . ABDOMINAL AORTIC ANEURYSM REPAIR     2009, Dr. Scot Dock  . ABDOMINAL AORTOGRAM W/LOWER EXTREMITY Right 04/07/2020   Procedure: ABDOMINAL AORTOGRAM W/LOWER EXTREMITY;  Surgeon: Angelia Mould, MD;  Location: [facility];  Service: Cardiovascular;  Laterality: Right;  . CARDIAC CATHETERIZATION     patient thinks it was in 09 with dr Scot Dock  . cataracts removed    . INCONTINENCE SURGERY    . IR THORACENTESIS ASP PLEURAL SPACE W/IMG GUIDE  06/07/2020  . IR THORACENTESIS ASP PLEURAL SPACE W/IMG GUIDE  06/14/2020  . THORACENTESIS N/A 05/16/2020   Procedure: Mathews Robinsons;  Surgeon: Lanier Clam, MD;  Location[facility];  Service: Pulmonary;  Laterality: N/A;  . TOTAL HIP ARTHROPLASTY Right 08/01/2014   Procedure: RIGHT TOTAL HIP ARTHROPLASTY  ANTERIOR APPROACH;  Surgeon: Marianna Payment, MD;  Location: [facility];  Service: Orthopedics;  Laterality: Right;  . TOTAL HIP ARTHROPLASTY Left 03/27/2016   Procedure: LEFT TOTAL HIP ARTHROPLASTY ANTERIOR APPROACH;  Surgeon: Leandrew Koyanagi, MD;  Location: [facility];  Service: Orthopedics;  Laterality: Left;     OB History   No obstetric history on file.     Family History  Problem Relation Age of Onset  . Heart failure Father   . Stroke Mother     Social  History   Tobacco Use  . Smoking status: Former Smoker    Packs/day: 1.00    Years: 30.00    Pack years: 30.00    Quit date: 05/31/1984    Years since quitting: 36.1  . Smokeless tobacco: Never Used  Vaping Use  . Vaping Use: Never used  Substance Use Topics  . Alcohol use: No    Alcohol/week: 0.0 standard drinks  . Drug use: No    Home Medications Prior to Admission medications   Medication Sig Start Date End Date Taking? Authorizing Provider  acetaminophen (TYLENOL) 650 MG CR tablet Take 650 mg by mouth daily as needed for pain (headache).    [provider]  albuterol (VENTOLIN HFA) 108 (90 Base) MCG/ACT inhaler Inhale 2 puffs into the lungs every 4 (four) hours as needed for wheezing or shortness of breath.    [provider]  amLODipine (NORVASC) 10 MG tablet Take 1 tablet by mouth once daily Patient taking differently: Take 10 mg by mouth daily. 02/29/20   Pixie Casino, MD  aspirin EC 81 MG tablet Take 81 mg by mouth daily.    [provider]  cetirizine (ZYRTEC) 10 MG tablet Take 10 mg by mouth at bedtime.    [provider]  Cholecalciferol (VITAMIN D3 PO) Take 1 tablet by mouth at bedtime.    [provider]  escitalopram (LEXAPRO) 10 MG tablet Take 10 mg by mouth daily.    [provider]  ketotifen (ZADITOR) 0.025 % ophthalmic solution Place 1 drop into both eyes daily.    [provider]  levocetirizine (XYZAL) 5 MG tablet Take 5 mg by mouth at bedtime.    [provider]  losartan (COZAAR) 50 MG tablet TAKE 1 TABLET BY MOUTH ONCE DAILY Patient taking differently: Take 50 mg by mouth at bedtime. 07/24/18   Hilty, Nadean Corwin, MD  lovastatin (MEVACOR) 20 MG tablet TAKE 1 TABLET BY MOUTH AT BEDTIME Patient taking differently: Take 20 mg by mouth at bedtime. 08/18/19   Hilty, Nadean Corwin, MD  Magnesium 250 MG TABS Take 250 mg by mouth every other day.    [provider]  melatonin 3 MG TABS tablet  Take 1 tablet (3 mg total) by mouth at bedtime. 05/16/20   Armando Reichert, MD  metoprolol tartrate (LOPRESSOR) 25 MG tablet Take 0.5 tablets (12.5 mg total) by mouth 2 (two) times daily. 12/27/19   Hilty, Nadean Corwin, MD  Omega-3 Fatty Acids (FISH OIL) 1000 MG CAPS Take 2,000 mg by mouth at bedtime.     [provider]  ondansetron (ZOFRAN) 8 MG tablet Take 1 tablet (8 mg total) by mouth every 8 (eight) hours as needed for nausea or vomiting. Begin 72 hours after day 1 IV infusion. 06/08/20   Ladell Pier, MD  prochlorperazine (COMPAZINE) 5 MG tablet Take 1 tablet (5 mg total) by mouth every 6 (six) hours as needed for nausea or vomiting. 06/08/20  Ladell Pier, MD    Allergies    Percocet [oxycodone-acetaminophen]  Review of Systems   Review of Systems  Constitutional: Negative for chills, diaphoresis, fatigue and fever.  HENT: Negative for congestion, sore throat and trouble swallowing.   Eyes: Negative for pain and visual disturbance.  Respiratory: Positive for cough and shortness of breath. Negative for wheezing.   Cardiovascular: Negative for chest pain, palpitations and leg swelling.  Gastrointestinal: Negative for abdominal distention, abdominal pain, diarrhea, nausea and vomiting.  Genitourinary: Negative for difficulty urinating.  Musculoskeletal: Negative for back pain, neck pain and neck stiffness.  Skin: Negative for pallor.  Neurological: Negative for dizziness, speech difficulty, weakness and headaches.  Psychiatric/Behavioral: Negative for confusion.    Physical Exam Updated Vital Signs BP 133/64   Pulse 89   Temp 97.7 F (36.5 C) (Oral)   Resp 20   SpO2 96%   Physical Exam Constitutional:      General: She is not in acute distress.    Appearance: Normal appearance. She is not ill-appearing, toxic-appearing or diaphoretic.     Comments: Well-appearing 87 year old female laying in bed no acute distress, slightly tachypneic to 23, oxygen saturation  around 98% on room air.  Patient appears well, no acute distress.  HENT:     Head: Normocephalic and atraumatic.     Mouth/Throat:     Mouth: Mucous membranes are moist.     Pharynx: Oropharynx is clear.  Eyes:     General: No scleral icterus.    Extraocular Movements: Extraocular movements intact.     Pupils: Pupils are equal, round, and reactive to light.  Cardiovascular:     Rate and Rhythm: Normal rate and regular rhythm.     Pulses: Normal pulses.     Heart sounds: Normal heart sounds.  Pulmonary:     Effort: Pulmonary effort is normal. Tachypnea present. No accessory muscle usage or respiratory distress.     Breath sounds: No stridor. Examination of the right-middle field reveals decreased breath sounds. Examination of the right-lower field reveals decreased breath sounds. Decreased breath sounds and rales present. No wheezing or rhonchi.  Chest:     Chest wall: No tenderness.  Abdominal:     General: Abdomen is flat. There is no distension.     Palpations: Abdomen is soft.     Tenderness: There is no abdominal tenderness. There is no guarding or rebound.  Musculoskeletal:        General: No swelling or tenderness. Normal range of motion.     Cervical back: Normal range of motion and neck supple. No rigidity.  Skin:    General: Skin is warm and dry.     Capillary Refill: Capillary refill takes less than 2 seconds.     Coloration: Skin is not pale.  Neurological:     General: No focal deficit present.     Mental Status: She is alert and oriented to person, place, and time.  Psychiatric:        Mood and Affect: Mood normal.        Behavior: Behavior normal.     ED Results / Procedures / Treatments   Labs (all labs ordered are listed, but only abnormal results are displayed) Labs Reviewed  BASIC METABOLIC PANEL - Abnormal; Notable for the following components:      Result Value   Sodium 126 (*)    Chloride 92 (*)    Glucose, Bld 157 (*)    All other components  within normal  limits  CBC - Abnormal; Notable for the following components:   WBC 2.8 (*)    HCT 35.3 (*)    Platelets 459 (*)    All other components within normal limits  SARS CORONAVIRUS 2 (TAT 6-24 HRS)    EKG EKG Interpretation  Date/Time:  Saturday July 01 2020 14:49:33 EST Ventricular Rate:  78 PR Interval:    QRS Duration: 123 QT Interval:  417 QTC Calculation: 478 R Axis:   1 Text Interpretation: Sinus rhythm Nonspecific intraventricular conduction delay Borderline T abnormalities, anterior leads 12 Lead; [place] similar to nov 2021 Confirmed by Sherwood Gambler [id number]) on 07/01/2020 5:41:43 PM   Radiology DG Chest 2 View  Result Date: 07/01/2020 CLINICAL DATA:  Shortness of breath. History of pleural effusion and thoracentesis. EXAM: CHEST - 2 VIEW COMPARISON:  Chest x-ray dated 06/21/2020. FINDINGS: Reaccumulating RIGHT pleural effusion, moderate in size. Stable chronic airspace opacity within the RIGHT upper lobe, better demonstrated as a mass on chest CT of 05/15/2020. No new findings on the LEFT. Heart size and mediastinal contours appear stable. IMPRESSION: 1. Reaccumulating RIGHT pleural effusion, moderate in size. 2. Stable chronic airspace opacity within the RIGHT upper lobe, better demonstrated as a mass on chest CT of 05/15/2020. Electronically Signed   By: Franki Cabot M.D.   On: 07/01/2020 15:15   DG Chest Port 1 View  Result Date: 07/01/2020 CLINICAL DATA:  Post right-sided thoracentesis. EXAM: PORTABLE CHEST 1 VIEW COMPARISON:  Radiograph 07/01/2020, CT 05/15/2020 FINDINGS: Slight decreased right pleural effusion after thoracentesis. No visualized pneumothorax or pleural line. Opacity in the right lung most prominent in the suprahilar region is unchanged from earlier today. Stable heart size and mediastinal contours. Aortic atherosclerosis. Mild atelectasis at the left lung base. Lingular nodule on CT is not well seen by radiograph. Bones are diffusely under  mineralized. Remote left rib fractures. IMPRESSION: 1. Slight decreased right pleural effusion after thoracentesis. No visualized pneumothorax. 2. Unchanged right upper lung zone opacity, right upper lobe mass on prior CT. Electronically Signed   By: Keith Rake M.D.   On: 07/01/2020 19:39    Procedures Procedures (including critical care time)  Medications Ordered in ED Medications  albuterol (VENTOLIN HFA) 108 (90 Base) MCG/ACT inhaler 2 puff (has no administration in time range)  lidocaine-EPINEPHrine (XYLOCAINE W/EPI) 2 %-1:200000 (PF) injection 10 mL (10 mLs Infiltration Given by Other 07/01/20 1837)    ED Course  I have reviewed the triage vital signs and the nursing notes.  Pertinent labs & imaging results that were available during my care of the patient were reviewed by me and considered in my medical decision making (see chart for details).    MDM Rules/Calculators/A&P                         Mary Merritt is a 87 y.o. female with pertinent past medical history of small cell lung cancer currently being treated with chemotherapy followed by Dr.Sherill presents emergency department today for shortness of breath.  Chest x-ray with large pleural effusion due to known cancer.  Patient most likely has had this accumulating for the past week and a half, patient is a couple days past her normal timeframe for when she gets her thoracentesis.    Spoke to Dr. Reesa Chew, interventional radiology he states that they are no longer in the emergency department and patient will need to be admitted for thoracentesis in the morning.  Did speak to patient at  this, she does want to have it drained here in the emergency department today.  Dr. Regenia Skeeter was able to perform this procedure, patient states that she feels much better post procedure. Prior to procedure patient was slightly tachypneic to 23, after procedure patient has been observed for 45 minutes, states that she feels much better.  Repeat x-ray  without any pneumo.  Patient satting at 98% on room air, not tachypneic anymore.  States that she feels much better and is ready to go home.  Patient will follow-up with oncologist and PCP.  Symptomatic treatment discussed.  Did discuss that we did order the 6 to 24-hour COVID swab, will return in the next 24 hours.  Daughter is aware, they state that they will follow-up with PCP if COVID is positive.  Patient blood work does appear to be at baseline except for patient does have new leukopenia, this is most likely due to chemotherapy, however did discuss that this could also be due to [place].  Even if patient does have COVID, she is satting at 98% on room air and feels much better than when she came in, patient will follow-up with PCP.  Patient to be discharged at this time.  Doubt need for further emergent work up at this time. I explained the diagnosis and have given explicit precautions to return to the ER including for any other new or worsening symptoms. The patient understands and accepts the medical plan as it's been dictated and I have answered their questions. Discharge instructions concerning home care and prescriptions have been given. The patient is STABLE and is discharged to home in good condition.  I discussed this case with my attending physician who cosigned this note including patient's presenting symptoms, physical exam, and planned diagnostics and interventions. Attending physician stated agreement with plan or made changes to plan which were implemented.   Attending physician assessed patient at bedside.  Final Clinical Impression(s) / ED Diagnoses Final diagnoses:  Pleural effusion    Rx / DC Orders ED Discharge Orders    None       Alfredia Client, PA-C 07/01/20 2016    Sherwood Gambler, MD 07/03/20 1601    Sherwood Gambler, MD 07/03/20 [phone]

## 2020-07-01 NOTE — ED Triage Notes (Addendum)
Pt has a history of fluid in lungs, chemo pt, last night increased SHOB with cough.At end of triage states her throat is sore/not feeling right.

## 2020-07-02 LAB — SARS CORONAVIRUS 2 (TAT 6-24 HRS): SARS Coronavirus 2: NEGATIVE

## 2020-07-03 ENCOUNTER — Telehealth: Payer: Self-pay | Admitting: *Deleted

## 2020-07-03 NOTE — Telephone Encounter (Signed)
Daughter called to report patient had ER visit on 07/01/20 due to shortness of breath. Had a bedside thoracentesis with 1 liter fluid removed and returned home feeling better. MD notified.

## 2020-07-05 ENCOUNTER — Ambulatory Visit (HOSPITAL_COMMUNITY)
Admission: RE | Admit: 2020-07-05 | Discharge: 2020-07-05 | Disposition: A | Discharge status: Home / Self Care | Payer: Medicare Other | Source: Ambulatory Visit | Attending: Nurse Practitioner | Admitting: Nurse Practitioner

## 2020-07-05 ENCOUNTER — Encounter: Payer: Self-pay | Admitting: Nurse Practitioner

## 2020-07-05 ENCOUNTER — Telehealth: Payer: Self-pay

## 2020-07-05 ENCOUNTER — Other Ambulatory Visit: Payer: Self-pay

## 2020-07-05 DIAGNOSIS — Z7189 Other specified counseling: Secondary | ICD-10-CM

## 2020-07-05 DIAGNOSIS — C3491 Malignant neoplasm of unspecified part of right bronchus or lung: Secondary | ICD-10-CM | POA: Diagnosis not present

## 2020-07-05 DIAGNOSIS — J90 Pleural effusion, not elsewhere classified: Secondary | ICD-10-CM | POA: Diagnosis not present

## 2020-07-05 NOTE — Telephone Encounter (Signed)
Spoke with daughter concerning incoming message below  Provider made aware cxr ordered and follow up plan of care depending on cxr results   Good Morning, Mary Merritt is starting to have difficulty in breathing again, I could not get through on the phone to leave you a message. Is it possible to schedule a radiology appointment to look at the fluid in her lung to see if she needs another Thoracentesis? We went to the emergency room on Saturday to have it done, we would rather not get in that situation again.  Thank you, Mary Merritt Right

## 2020-07-07 ENCOUNTER — Telehealth: Payer: Self-pay

## 2020-07-07 ENCOUNTER — Other Ambulatory Visit: Payer: Self-pay

## 2020-07-07 ENCOUNTER — Ambulatory Visit (HOSPITAL_COMMUNITY)
Admission: RE | Admit: 2020-07-07 | Discharge: 2020-07-07 | Disposition: A | Discharge status: Home / Self Care | Payer: Medicare Other | Source: Ambulatory Visit | Attending: Nurse Practitioner | Admitting: Nurse Practitioner

## 2020-07-07 ENCOUNTER — Inpatient Hospital Stay (HOSPITAL_COMMUNITY)
Admission: EM | Admit: 2020-07-07 | Discharge: 2020-07-14 | DRG: 180 | Disposition: A | Discharge status: Skilled Nursing Facility | Payer: Medicare Other | Attending: Student | Admitting: Student

## 2020-07-07 ENCOUNTER — Encounter (HOSPITAL_COMMUNITY): Payer: Self-pay | Admitting: Emergency Medicine

## 2020-07-07 ENCOUNTER — Inpatient Hospital Stay (HOSPITAL_BASED_OUTPATIENT_CLINIC_OR_DEPARTMENT_OTHER): Payer: Medicare Other | Admitting: Nurse Practitioner

## 2020-07-07 ENCOUNTER — Encounter: Payer: Self-pay | Admitting: Nurse Practitioner

## 2020-07-07 ENCOUNTER — Inpatient Hospital Stay (HOSPITAL_COMMUNITY): Payer: Medicare Other

## 2020-07-07 ENCOUNTER — Encounter: Payer: Self-pay | Admitting: *Deleted

## 2020-07-07 VITALS — BP 115/58 | HR 88 | Temp 98.2°F | Resp 20 | Ht 65.0 in

## 2020-07-07 DIAGNOSIS — C7802 Secondary malignant neoplasm of left lung: Secondary | ICD-10-CM | POA: Diagnosis present

## 2020-07-07 DIAGNOSIS — J90 Pleural effusion, not elsewhere classified: Secondary | ICD-10-CM | POA: Diagnosis present

## 2020-07-07 DIAGNOSIS — Z885 Allergy status to narcotic agent status: Secondary | ICD-10-CM

## 2020-07-07 DIAGNOSIS — D472 Monoclonal gammopathy: Secondary | ICD-10-CM | POA: Diagnosis present

## 2020-07-07 DIAGNOSIS — E871 Hypo-osmolality and hyponatremia: Secondary | ICD-10-CM | POA: Diagnosis present

## 2020-07-07 DIAGNOSIS — I4891 Unspecified atrial fibrillation: Secondary | ICD-10-CM | POA: Diagnosis not present

## 2020-07-07 DIAGNOSIS — R5381 Other malaise: Secondary | ICD-10-CM | POA: Diagnosis present

## 2020-07-07 DIAGNOSIS — F32A Depression, unspecified: Secondary | ICD-10-CM | POA: Diagnosis present

## 2020-07-07 DIAGNOSIS — Z20822 Contact with and (suspected) exposure to covid-19: Secondary | ICD-10-CM | POA: Diagnosis present

## 2020-07-07 DIAGNOSIS — Z9889 Other specified postprocedural states: Secondary | ICD-10-CM

## 2020-07-07 DIAGNOSIS — C3490 Malignant neoplasm of unspecified part of unspecified bronchus or lung: Secondary | ICD-10-CM | POA: Diagnosis present

## 2020-07-07 DIAGNOSIS — Z96643 Presence of artificial hip joint, bilateral: Secondary | ICD-10-CM | POA: Diagnosis present

## 2020-07-07 DIAGNOSIS — I10 Essential (primary) hypertension: Secondary | ICD-10-CM | POA: Diagnosis present

## 2020-07-07 DIAGNOSIS — Z87891 Personal history of nicotine dependence: Secondary | ICD-10-CM

## 2020-07-07 DIAGNOSIS — J9601 Acute respiratory failure with hypoxia: Secondary | ICD-10-CM | POA: Diagnosis present

## 2020-07-07 DIAGNOSIS — G893 Neoplasm related pain (acute) (chronic): Secondary | ICD-10-CM | POA: Diagnosis present

## 2020-07-07 DIAGNOSIS — C3411 Malignant neoplasm of upper lobe, right bronchus or lung: Secondary | ICD-10-CM | POA: Diagnosis present

## 2020-07-07 DIAGNOSIS — E222 Syndrome of inappropriate secretion of antidiuretic hormone: Secondary | ICD-10-CM | POA: Diagnosis present

## 2020-07-07 DIAGNOSIS — R9389 Abnormal findings on diagnostic imaging of other specified body structures: Secondary | ICD-10-CM | POA: Diagnosis present

## 2020-07-07 DIAGNOSIS — Z66 Do not resuscitate: Secondary | ICD-10-CM | POA: Diagnosis present

## 2020-07-07 DIAGNOSIS — C3491 Malignant neoplasm of unspecified part of right bronchus or lung: Secondary | ICD-10-CM | POA: Insufficient documentation

## 2020-07-07 DIAGNOSIS — E785 Hyperlipidemia, unspecified: Secondary | ICD-10-CM | POA: Diagnosis present

## 2020-07-07 DIAGNOSIS — Z7982 Long term (current) use of aspirin: Secondary | ICD-10-CM

## 2020-07-07 DIAGNOSIS — Z79899 Other long term (current) drug therapy: Secondary | ICD-10-CM

## 2020-07-07 DIAGNOSIS — Z8249 Family history of ischemic heart disease and other diseases of the circulatory system: Secondary | ICD-10-CM | POA: Diagnosis not present

## 2020-07-07 DIAGNOSIS — J910 Malignant pleural effusion: Secondary | ICD-10-CM | POA: Diagnosis not present

## 2020-07-07 DIAGNOSIS — R0609 Other forms of dyspnea: Secondary | ICD-10-CM | POA: Diagnosis not present

## 2020-07-07 DIAGNOSIS — R531 Weakness: Secondary | ICD-10-CM | POA: Diagnosis not present

## 2020-07-07 DIAGNOSIS — J9621 Acute and chronic respiratory failure with hypoxia: Secondary | ICD-10-CM | POA: Diagnosis not present

## 2020-07-07 DIAGNOSIS — R0600 Dyspnea, unspecified: Secondary | ICD-10-CM

## 2020-07-07 LAB — CBC WITH DIFFERENTIAL/PLATELET
Abs Immature Granulocytes: 0.06 10*3/uL (ref 0.00–0.07)
Basophils Absolute: 0 10*3/uL (ref 0.0–0.1)
Basophils Relative: 0 %
Eosinophils Absolute: 0 10*3/uL (ref 0.0–0.5)
Eosinophils Relative: 0 %
HCT: 37.7 % (ref 36.0–46.0)
Hemoglobin: 12.7 g/dL (ref 12.0–15.0)
Immature Granulocytes: 1 %
Lymphocytes Relative: 10 %
Lymphs Abs: 0.9 10*3/uL (ref 0.7–4.0)
MCH: 29.2 pg (ref 26.0–34.0)
MCHC: 33.7 g/dL (ref 30.0–36.0)
MCV: 86.7 fL (ref 80.0–100.0)
Monocytes Absolute: 0.9 10*3/uL (ref 0.1–1.0)
Monocytes Relative: 10 %
Neutro Abs: 7.5 10*3/uL (ref 1.7–7.7)
Neutrophils Relative %: 79 %
Platelets: 466 10*3/uL — ABNORMAL HIGH (ref 150–400)
RBC: 4.35 MIL/uL (ref 3.87–5.11)
RDW: 14.6 % (ref 11.5–15.5)
WBC: 9.5 10*3/uL (ref 4.0–10.5)
nRBC: 0 % (ref 0.0–0.2)

## 2020-07-07 LAB — PROTIME-INR
INR: 1 (ref 0.8–1.2)
Prothrombin Time: 12.8 seconds (ref 11.4–15.2)

## 2020-07-07 LAB — COMPREHENSIVE METABOLIC PANEL
ALT: 14 U/L (ref 0–44)
AST: 17 U/L (ref 15–41)
Albumin: 2.9 g/dL — ABNORMAL LOW (ref 3.5–5.0)
Alkaline Phosphatase: 65 U/L (ref 38–126)
Anion gap: 14 (ref 5–15)
BUN: 18 mg/dL (ref 8–23)
CO2: 23 mmol/L (ref 22–32)
Calcium: 9.2 mg/dL (ref 8.9–10.3)
Chloride: 90 mmol/L — ABNORMAL LOW (ref 98–111)
Creatinine, Ser: 0.69 mg/dL (ref 0.44–1.00)
GFR, Estimated: >60 mL/min (ref >60)
Glucose, Bld: 128 mg/dL — ABNORMAL HIGH (ref 70–99)
Potassium: 4.1 mmol/L (ref 3.5–5.1)
Sodium: 127 mmol/L — ABNORMAL LOW (ref 135–145)
Total Bilirubin: 0.5 mg/dL (ref 0.3–1.2)
Total Protein: 6.4 g/dL — ABNORMAL LOW (ref 6.5–8.1)

## 2020-07-07 LAB — RESP PANEL BY RT-PCR (FLU A&B, COVID) ARPGX2
Influenza A by PCR: NEGATIVE
Influenza B by PCR: NEGATIVE
SARS Coronavirus 2 by RT PCR: NEGATIVE

## 2020-07-07 MED ORDER — AMLODIPINE BESYLATE 10 MG PO TABS
10.0000 mg | ORAL_TABLET | Freq: Every day | ORAL | Status: DC
  Administered 2020-07-08 – 2020-07-13 (×5): 10 mg via ORAL
  Filled 2020-07-07: qty 1
  Filled 2020-07-07: qty 2
  Filled 2020-07-07 (×4): qty 1

## 2020-07-07 MED ORDER — MAGNESIUM OXIDE 400 (241.3 MG) MG PO TABS
200.0000 mg | ORAL_TABLET | ORAL | Status: DC
  Administered 2020-07-08 – 2020-07-12 (×3): 200 mg via ORAL
  Filled 2020-07-07 (×5): qty 1

## 2020-07-07 MED ORDER — LOSARTAN POTASSIUM 50 MG PO TABS
50.0000 mg | ORAL_TABLET | Freq: Every day | ORAL | Status: DC
Start: 2020-07-07 — End: 2020-07-13
  Administered 2020-07-07 – 2020-07-12 (×6): 50 mg via ORAL
  Filled 2020-07-07 (×5): qty 1
  Filled 2020-07-07: qty 2

## 2020-07-07 MED ORDER — ONDANSETRON HCL 4 MG PO TABS: 4.0000 mg | ORAL_TABLET | Freq: Four times a day (QID) | ORAL | Status: DC | PRN

## 2020-07-07 MED ORDER — IOHEXOL 350 MG/ML SOLN
100.0000 mL | Freq: Once | INTRAVENOUS | Status: AC | PRN
  Administered 2020-07-07: 75 mL via INTRAVENOUS

## 2020-07-07 MED ORDER — ESCITALOPRAM OXALATE 10 MG PO TABS
10.0000 mg | ORAL_TABLET | Freq: Every day | ORAL | Status: DC
  Administered 2020-07-08 – 2020-07-14 (×7): 10 mg via ORAL
  Filled 2020-07-07 (×8): qty 1

## 2020-07-07 MED ORDER — METOPROLOL TARTRATE 25 MG PO TABS
12.5000 mg | ORAL_TABLET | Freq: Two times a day (BID) | ORAL | Status: DC
  Administered 2020-07-07 – 2020-07-14 (×13): 12.5 mg via ORAL
  Filled 2020-07-07 (×14): qty 1

## 2020-07-07 MED ORDER — PRAVASTATIN SODIUM 20 MG PO TABS
20.0000 mg | ORAL_TABLET | Freq: Every day | ORAL | Status: DC
  Administered 2020-07-08 – 2020-07-14 (×7): 20 mg via ORAL
  Filled 2020-07-07 (×8): qty 1

## 2020-07-07 MED ORDER — TURMERIC CURCUMIN PO CAPS: ORAL_CAPSULE | Freq: Every day | ORAL | Status: DC

## 2020-07-07 MED ORDER — OMEGA-3-ACID ETHYL ESTERS 1 G PO CAPS
2.0000 g | ORAL_CAPSULE | Freq: Every day | ORAL | Status: DC
  Administered 2020-07-07 – 2020-07-13 (×7): 2 g via ORAL
  Filled 2020-07-07 (×9): qty 2

## 2020-07-07 MED ORDER — ONDANSETRON HCL 4 MG/2ML IJ SOLN: 4.0000 mg | Freq: Four times a day (QID) | INTRAMUSCULAR | Status: DC | PRN

## 2020-07-07 NOTE — Progress Notes (Addendum)
Mary Merritt OFFICE PROGRESS NOTE   Diagnosis: Small cell lung cancer  INTERVAL HISTORY:   Mary Merritt is seen prior to scheduled follow-up for evaluation of increased dyspnea.  She completed cycle 1 modified Carboplatin/Etoposide/atezolizumab beginning 06/14/2020.  She had a thoracentesis in the emergency department on 07/01/2020 with 1 L of fluid removed.  She reports the dyspnea improved for about 1 day.  Chest x-ray 07/05/2020 with slight interval increase in the right pleural effusion noted  Objective:  Vital signs in last 24 hours:  Blood pressure 131/75, pulse (!) 112, temperature 98.4 F (36.9 C), temperature source Tympanic, resp. rate 20, height 5\' 5"  (1.651 m), SpO2 94 %.    Resp: Breath sounds diminished right lung field, rales right lung base.  Increased respiratory rate. Cardio: Markedly irregular. GI: Abdomen soft and nontender. Vascular: Trace edema left lower leg (chronic). Neuro: Alert and oriented.   Lab Results:  Lab Results  Component Value Date   WBC 2.8 (L) 07/01/2020   HGB 12.0 07/01/2020   HCT 35.3 (L) 07/01/2020   MCV 86.3 07/01/2020   PLT 459 (H) 07/01/2020   NEUTROABS 4.5 06/22/2020    Imaging:  No results found.  Medications: I have reviewed the patient's current medications.  Assessment/Plan: 1. Right lung mass concerning for malignancy -05/15/2020-CT chest with contrast showed a 7.2 x 5.9 cm right upper lobe lung mass with invasion into the right hilum and mediastinum, extensive mediastinal and right hilar lymphadenopathy, extensive interstitial spread of tumor in the right lung and large loculated right pleural fluid collection likely secondary to pleural metastatic disease, scattered pulmonary nodules in the left lung suspicious for metastatic disease, 13 x 10.5 mm spiculated lesion in the lingula which could be a second primary. -Cytology on pleural fluid 11/23/2021positivefor malignant cells;cluster of cells positive  forMOC31, TTF-1,synaptophysinand CD 56; possibility of small cell carcinoma raised -Cytology on pleural fluid 05/26/2020-malignant cells present with immunophenotype raising possibility of small cell carcinoma -Biopsy right supraclavicular lymph node 06/09/2020-small cell carcinoma -Cycle 1 modified Carboplatin/Etoposide plus atezolizumab 06/14/2020 2. Pleural effusion-malignant 3.Postobstructive pneumonia? 4. Hyponatremia 5. Mild normocytic anemia 6. Thrombocytosis 7. Hypertension 8. AAA status post repair in 2009 9. PAD 10. Hyperlipidemia 11. Depression  12. Anxiety 13.Hyponatremia 14.MGUS  Disposition: Mary Merritt is a 87 year old woman with small cell lung cancer status post cycle 1 modified Carboplatin/Etoposide plus atezolizumab 06/14/2020.  She has a malignant right pleural effusion requiring periodic thoracentesis procedures, most recently 07/01/2020.  Chest x-ray earlier today shows the right pleural fluid is largely stable as compared to the postthoracentesis chest x-ray 07/01/2020.  She reports increasing dyspnea.  She is in rapid atrial fibrillation.  Dr. Benay Spice reviewed with Mary Merritt and her son that the dyspnea is likely due to a combination of cancer/pleural fluid, possible emphysema, atrial fibrillation.  We contacted the hospitalist with the recommendation for evaluation in the emergency department prior to admission.  Of note, on reevaluation she appears to be back in normal sinus rhythm.  She remains dyspneic.  We applied 2 L of oxygen with minimal improvement.  Patient seen with Dr. Benay Spice.   Ned Card ANP/GNP-BC   07/07/2020  3:10 PM  This was a shared visit with Ned Card.  Mary Merritt was interviewed and examined.  She presents today with increased dyspnea.  She underwent a thoracentesis 1 week ago.  The chest x-ray does not appear significantly changed.  She was confirmed to have have rapid atrial fibrillation in the office today.  I suspect  the  dyspnea is secondary to COPD, the tumor burden in the right chest, the pleural effusion, and there may be a component related atrial fibrillation.  Mary Merritt has dyspnea at rest.  She agrees to hospital admission for further evaluation.  I recommend a CT chest for further evaluation.  She has completed only 1 cycle of systemic therapy.  We will need to consider hospice care if the lung cancer has progressed.  Please call oncology as needed over the weekend.  I will check on her 07/10/2020 if she remains in the hospital.  I was present for greater than 50% of today's visit and performed medical decision making  Julieanne Manson, MD

## 2020-07-07 NOTE — Telephone Encounter (Signed)
Daughter stated pt self reported SOB unless at rest. Daughter unable to see pt in person due to [place] exposure.  CXR to be ordered for pt, her son can take her.

## 2020-07-07 NOTE — Progress Notes (Signed)
PHARMACIST - PHYSICIAN ORDER COMMUNICATION  CONCERNING: P&T Medication Policy on Herbal Medications  DESCRIPTION:  This patient's order for: Turmeric has been noted.  This product(s) is classified as an "herbal" or natural product. Due to a lack of definitive safety studies or FDA approval, nonstandard manufacturing practices, plus the potential risk of unknown drug-drug interactions while on inpatient medications, the Pharmacy and Therapeutics Committee does not permit the use of "herbal" or natural products of this type within [facility].   ACTION TAKEN: The pharmacy department is unable to verify this order at this time and your patient has been informed of this safety policy. Please reevaluate patient's clinical condition at discharge and address if the herbal or natural product(s) should be resumed at that time.   Lindell Spar, PharmD, BCPS Clinical Pharmacist  07/07/2020 10:23 PM

## 2020-07-07 NOTE — Telephone Encounter (Signed)
Pt's daughter contacted to inquire about status. Daughter will check on pt in person later this am. RN to call back.

## 2020-07-07 NOTE — ED Provider Notes (Signed)
[facility] DEPT Provider Note   CSN: [id number] Arrival date & time: 07/07/20  1756     History Chief Complaint  Patient presents with  . Shortness of Breath    Mary Merritt is a 87 y.o. female.  87 year old female with medical history as detailed below presents for evaluation.  Patient was seen in the oncology clinic earlier this afternoon.  She was sent from the clinic for admission for reported dyspnea.  Patient was noted to be in A. fib with RVR while in the clinic.  She also complained of worsening shortness of breath.  She has a also history of pleural effusion status post prior thoracentesis.  Upon arrival to the ED she is comfortable.  She is now in normal sinus rhythm.  She denies associated fever.  The history is provided by the patient and medical records.  Shortness of Breath Severity:  Moderate Onset quality:  Gradual Timing:  Constant Progression:  Unchanged Chronicity:  Recurrent Relieved by:  Nothing Worsened by:  Nothing      Past Medical History:  Diagnosis Date  . AAA (abdominal aortic aneurysm) ([place])   . Anxiety   . Arthritis   . Dyspnea   . Dysrhythmia    bradycardia  . History of bronchitis   . Hyperlipidemia   . Hypertension   . Seasonal allergies     Patient Active Problem List   Diagnosis Date Noted  . Pleural effusion 07/07/2020  . Small cell lung cancer ([place]) 06/05/2020  . Goals of care, counseling/discussion 06/05/2020  . Abnormal chest x-ray   . Pneumonia 05/14/2020  . Pain in right foot 11/17/2019  . Hyponatremia 11/22/2017  . Nondisplaced fracture of left tibial spine, subsequent encounter for closed fracture with routine healing 08/05/2017  . Chronic pain of left knee 07/21/2017  . Presence of left artificial hip joint 05/30/2016  . Hip joint replacement status 03/27/2016  . S/P hip replacement 07/27/2015  . Osteoarthritis of right hip 08/01/2014  . Essential hypertension 05/31/2014  . Mixed  hyperlipidemia 05/31/2014  . S/P AAA repair 05/31/2014  . Bradycardia 05/31/2014  . Murmur 05/31/2014    Past Surgical History:  Procedure Laterality Date  . ABDOMINAL AORTIC ANEURYSM REPAIR     2009, Dr. Scot Dock  . ABDOMINAL AORTOGRAM W/LOWER EXTREMITY Right 04/07/2020   Procedure: ABDOMINAL AORTOGRAM W/LOWER EXTREMITY;  Surgeon: Angelia Mould, MD;  Location: [facility];  Service: Cardiovascular;  Laterality: Right;  . CARDIAC CATHETERIZATION     patient thinks it was in 09 with dr Scot Dock  . cataracts removed    . INCONTINENCE SURGERY    . IR THORACENTESIS ASP PLEURAL SPACE W/IMG GUIDE  06/07/2020  . IR THORACENTESIS ASP PLEURAL SPACE W/IMG GUIDE  06/14/2020  . THORACENTESIS N/A 05/16/2020   Procedure: Mathews Robinsons;  Surgeon: Lanier Clam, MD;  Location: [facility] END[facility];  Service: Pulmonary;  Laterality: N/A;  . TOTAL HIP ARTHROPLASTY Right 08/01/2014   Procedure: RIGHT TOTAL HIP ARTHROPLASTY ANTERIOR APPROACH;  Surgeon: Marianna Payment, MD;  Location: [facility];  Service: Orthopedics;  Laterality: Right;  . TOTAL HIP ARTHROPLASTY Left 03/27/2016   Procedure: LEFT TOTAL HIP ARTHROPLASTY ANTERIOR APPROACH;  Surgeon: Leandrew Koyanagi, MD;  Location: [facility];  Service: Orthopedics;  Laterality: Left;     OB History   No obstetric history on file.     Family History  Problem Relation Age of Onset  . Heart failure Father   . Stroke Mother  Social History   Tobacco Use  . Smoking status: Former Smoker    Packs/day: 1.00    Years: 30.00    Pack years: 30.00    Quit date: 05/31/1984    Years since quitting: 36.1  . Smokeless tobacco: Never Used  Vaping Use  . Vaping Use: Never used  Substance Use Topics  . Alcohol use: No    Alcohol/week: 0.0 standard drinks  . Drug use: No    Home Medications Prior to Admission medications   Medication Sig Start Date End Date Taking? Authorizing Provider  amLODipine (NORVASC) 10 MG tablet Take 1 tablet by mouth once  daily Patient taking differently: Take 10 mg by mouth daily. 02/29/20  Yes Pixie Casino, MD  aspirin EC 81 MG tablet Take 81 mg by mouth daily.   Yes [provider]  escitalopram (LEXAPRO) 10 MG tablet Take 10 mg by mouth daily.   Yes [provider]  losartan (COZAAR) 50 MG tablet TAKE 1 TABLET BY MOUTH ONCE DAILY Patient taking differently: Take 50 mg by mouth at bedtime. 07/24/18  Yes Hilty, Nadean Corwin, MD  lovastatin (MEVACOR) 20 MG tablet TAKE 1 TABLET BY MOUTH AT BEDTIME Patient taking differently: Take 20 mg by mouth at bedtime. 08/18/19  Yes Hilty, Nadean Corwin, MD  Magnesium 250 MG TABS Take 250 mg by mouth every other day.   Yes [provider]  metoprolol tartrate (LOPRESSOR) 25 MG tablet Take 0.5 tablets (12.5 mg total) by mouth 2 (two) times daily. 12/27/19  Yes Hilty, Nadean Corwin, MD  Omega-3 Fatty Acids (FISH OIL) 1000 MG CAPS Take 2,000 mg by mouth at bedtime.    Yes [provider]  TURMERIC CURCUMIN PO Take 1 tablet by mouth daily.   Yes [provider]  melatonin 3 MG TABS tablet Take 1 tablet (3 mg total) by mouth at bedtime. Patient not taking: No sig reported 05/16/20   Armando Reichert, MD  ondansetron (ZOFRAN) 8 MG tablet Take 1 tablet (8 mg total) by mouth every 8 (eight) hours as needed for nausea or vomiting. Begin 72 hours after day 1 IV infusion. Patient not taking: No sig reported 06/08/20   Ladell Pier, MD  prochlorperazine (COMPAZINE) 5 MG tablet Take 1 tablet (5 mg total) by mouth every 6 (six) hours as needed for nausea or vomiting. Patient not taking: No sig reported 06/08/20   Ladell Pier, MD    Allergies    Percocet [oxycodone-acetaminophen]  Review of Systems   Review of Systems  Respiratory: Positive for shortness of breath.   All other systems reviewed and are negative.   Physical Exam Updated Vital Signs BP 127/60   Pulse 86   Temp 98.4 F (36.9 C) (Oral)   Resp (!) 21   SpO2 94%   Physical  Exam Vitals and nursing note reviewed.  Constitutional:      General: She is not in acute distress.    Appearance: She is well-developed and well-nourished.  HENT:     Head: Normocephalic and atraumatic.     Mouth/Throat:     Mouth: Oropharynx is clear and moist.  Eyes:     Extraocular Movements: EOM normal.     Conjunctiva/sclera: Conjunctivae normal.     Pupils: Pupils are equal, round, and reactive to light.  Cardiovascular:     Rate and Rhythm: Normal rate and regular rhythm.     Heart sounds: Normal heart sounds.  Pulmonary:     Effort: Pulmonary  effort is normal. No respiratory distress.     Breath sounds: Examination of the right-lower field reveals decreased breath sounds. Decreased breath sounds present.  Abdominal:     General: There is no distension.     Palpations: Abdomen is soft.     Tenderness: There is no abdominal tenderness.  Musculoskeletal:        General: No deformity or edema. Normal range of motion.     Cervical back: Normal range of motion and neck supple.  Skin:    General: Skin is warm and dry.  Neurological:     Mental Status: She is alert and oriented to person, place, and time.  Psychiatric:        Mood and Affect: Mood and affect normal.     ED Results / Procedures / Treatments   Labs (all labs ordered are listed, but only abnormal results are displayed) Labs Reviewed  COMPREHENSIVE METABOLIC PANEL - Abnormal; Notable for the following components:      Result Value   Sodium 127 (*)    Chloride 90 (*)    Glucose, Bld 128 (*)    Total Protein 6.4 (*)    Albumin 2.9 (*)    All other components within normal limits  CBC WITH DIFFERENTIAL/PLATELET - Abnormal; Notable for the following components:   Platelets 466 (*)    All other components within normal limits  RESP PANEL BY RT-PCR (FLU A&B, COVID) [id number]  PROTIME-INR  CBC  COMPREHENSIVE METABOLIC PANEL    EKG EKG Interpretation  Date/Time:  Friday July 07 2020 19:29:11  EST Ventricular Rate:  85 PR Interval:    QRS Duration: 86 QT Interval:  377 QTC Calculation: 449 R Axis:   -9 Text Interpretation: Sinus rhythm Prolonged PR interval Low voltage, precordial leads Borderline T abnormalities, anterior leads Confirmed by Dene Gentry [phone]) on 07/07/2020 8:40:45 PM   Radiology DG Chest 2 View  Result Date: 07/07/2020 CLINICAL DATA:  Increasing shortness of breath for 2 weeks. Known right lung cancer. EXAM: CHEST - 2 VIEW COMPARISON:  Radiographs 07/05/2020 and 07/01/2020.  CT 05/15/2020. FINDINGS: The heart size and mediastinal contours are stable. There is aortic atherosclerosis. Right suprahilar mass, partial right lung collapse and moderate-sized right pleural effusion have not significantly changed. Known peripheral left upper lobe nodule is not well visualized. There is no pneumothorax or acute osseous abnormality. IMPRESSION: No significant change in right suprahilar mass, partial right lung collapse and moderate right pleural effusion. Electronically Signed   By: Richardean Sale M.D.   On: 07/07/2020 16:26   CT Angio Chest PE W and/or Wo Contrast  Result Date: 07/07/2020 CLINICAL DATA:  Chest pain or shortness of breath.  Lung masses EXAM: CT ANGIOGRAPHY CHEST WITH CONTRAST TECHNIQUE: Multidetector CT imaging of the chest was performed using the standard protocol during bolus administration of intravenous contrast. Multiplanar CT image reconstructions and MIPs were obtained to evaluate the vascular anatomy. CONTRAST:  57mL OMNIPAQUE IOHEXOL 350 MG/ML SOLN COMPARISON:  Chest CT 05/15/2020 FINDINGS: Cardiovascular: Contrast injection is sufficient to demonstrate satisfactory opacification of the pulmonary arteries to the segmental level. There is no pulmonary embolus or evidence of right heart strain. The size of the main pulmonary artery is normal. Heart size is normal, with no pericardial effusion. The course and caliber of the aorta are normal. There is mild  atherosclerosis. No acute finding. Mediastinum/Nodes: Unchanged appearance of large prevascular node measuring 2.5 cm. Lungs/Pleura: Large tumor in the right lung with surrounding pleural fluid. There  is pleural nodularity along the right minor fissure. Multiple nodular opacities at the right lower lobe. Tumor surrounds the right mainstem bronchus and severely narrows the right upper lobe bronchus. The right pulmonary artery is also encased. Tumor size is increased. Upper Abdomen: Contrast bolus timing is not optimized for evaluation of the abdominal organs. The visualized portions of the organs of the upper abdomen are normal. Musculoskeletal: No chest wall abnormality. No bony spinal canal stenosis. Review of the MIP images confirms the above findings. IMPRESSION: 1. No pulmonary embolus or acute aortic syndrome. 2. 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 Tumor surrounds the right mainstem bronchus and severely narrows the right upper lobe bronchus. The right pulmonary artery is also encased. 3. Multiple nodular opacities at the right lung base, concerning for metastatic disease versus areas of consolidation. 4. Unchanged appearance of large prevascular node. Aortic Atherosclerosis (ICD10-I70.0). Electronically Signed   By: Ulyses Jarred M.D.   On: 07/07/2020 21:46    Procedures Procedures (including critical care time)  Medications Ordered in ED Medications  omega-3 acid ethyl esters (LOVAZA) capsule 2 g (has no administration in time range)  escitalopram (LEXAPRO) tablet 10 mg (has no administration in time range)  losartan (COZAAR) tablet 50 mg (has no administration in time range)  pravastatin (PRAVACHOL) tablet 20 mg (has no administration in time range)  metoprolol tartrate (LOPRESSOR) tablet 12.5 mg (has no administration in time range)  amLODipine (NORVASC) tablet 10 mg (has no administration in time range)  magnesium oxide (MAG-OX) tablet 200 mg (has no administration  in time range)  ondansetron (ZOFRAN) tablet 4 mg (has no administration in time range)    Or  ondansetron (ZOFRAN) injection 4 mg (has no administration in time range)  iohexol (OMNIPAQUE) 350 MG/ML injection 100 mL (75 mLs Intravenous Contrast Given 07/07/20 2107)    ED Course  I have reviewed the triage vital signs and the nursing notes.  Pertinent labs & imaging results that were available during my care of the patient were reviewed by me and considered in my medical decision making (see chart for details).    MDM Rules/Calculators/A&P                          MDM  Screen complete  Mary Merritt was evaluated in Emergency Department on 07/07/2020 for the symptoms described in the history of present illness. She was evaluated in the context of the global COVID-19 pandemic, which necessitated consideration that the patient might be at risk for infection with the SARS-CoV-2 virus that causes COVID-19. Institutional protocols and algorithms that pertain to the evaluation of patients at risk for COVID-19 are in a state of rapid change based on information released by regulatory bodies including the CDC and federal and state organizations. These policies and algorithms were followed during the patient's care in the ED.   Patient is presenting from the oncology clinic to be admitted for further work-up of dyspnea most likely related to COPD, pleural effusion, and A. Fib.  Patient is comfortable at time of my evaluation in ED.  Hospitalist service is aware of case and will evaluate for admission.  Final Clinical Impression(s) / ED Diagnoses Final diagnoses:  Dyspnea, unspecified type    Rx / DC Orders ED Discharge Orders    None       Valarie Merino, MD 07/07/20 2311

## 2020-07-07 NOTE — ED Notes (Signed)
Fall bundle: Yellow socks Yellow fall risk armband Fall risk sign outside door Bed alarm on

## 2020-07-07 NOTE — ED Notes (Signed)
External female catheter placed on patient.

## 2020-07-07 NOTE — Progress Notes (Signed)
Transported patient to ER #[id number] via w/c in stable condition on oxygen at 2 l/min [place]. Report provided to [place], Therapist, sports. Son sent to ER waiting area.

## 2020-07-07 NOTE — H&P (Signed)
History and Physical   Mary Merritt OJJ:[id number] DOB: 08-Nov-1927 DOA: 07/07/2020  Referring Merritt/NP/PA: Dr. Francia Greaves  PCP: Mary Merritt   Outpatient Specialists: Dr. Learta Merritt  Patient coming from: Home  Chief Complaint: Shortness of breath  HPI: Mary Merritt is a 87 y.o. female with medical history significant of stage IV lung cancer with recurrent malignant pleural effusion who has had thoracentesis recently.  Patient was doing better but started having significant shortness of breath and cough.  She went to the cancer center earlier in the day where she was evaluated.  Noted to be in A. fib with RVR at the time and also significant more pleural effusion.  Patient was sent over to the ER for further evaluation.  Suggestion for repeat CT chest.  In the ER patient is back in normal sinus rhythm.  She is currently feeling a little better after some diuresis.  She has been admitted to the hospital for reevaluation of her recurrent pleural effusion probably malignant effusion..  ED Course: Temperature is 98.4 blood pressure 150/58 pulse 111 respiratory 21 oxygen sat 94% room air.  White count 9.5 hemoglobin 12.1 platelet 410 sodium 128, potassium 3.8 chloride 93.  The rest of the chemistry appears to be within normal.  COVID-19 screening is negative.  CT angiogram of the chest showed no PE.  There is a large tumor in the right lung with a lot of pleural fluid.  Appears to have mass-effect on the right upper lobe bronchus.  Multiple nodular opacity of the right lung base concerning for metastatic disease.  Patient been admitted for further evaluation and treatment.  Review of Systems: As per HPI otherwise 10 point review of systems negative.    Past Medical History:  Diagnosis Date  . AAA (abdominal aortic aneurysm) ([place])   . Anxiety   . Arthritis   . Dyspnea   . Dysrhythmia    bradycardia  . History of bronchitis   . Hyperlipidemia   . Hypertension   . Seasonal allergies     Past  Surgical History:  Procedure Laterality Date  . ABDOMINAL AORTIC ANEURYSM REPAIR     2009, Dr. Scot Dock  . ABDOMINAL AORTOGRAM W/LOWER EXTREMITY Right 04/07/2020   Procedure: ABDOMINAL AORTOGRAM W/LOWER EXTREMITY;  Surgeon: Angelia Mould, Merritt;  Location: [facility];  Service: Cardiovascular;  Laterality: Right;  . CARDIAC CATHETERIZATION     patient thinks it was in 09 with dr Scot Dock  . cataracts removed    . INCONTINENCE SURGERY    . IR THORACENTESIS ASP PLEURAL SPACE W/IMG GUIDE  06/07/2020  . IR THORACENTESIS ASP PLEURAL SPACE W/IMG GUIDE  06/14/2020  . THORACENTESIS N/A 05/16/2020   Procedure: Mathews Robinsons;  Surgeon: Lanier Clam, Merritt;  Location: [facility];  Service: Pulmonary;  Laterality: N/A;  . TOTAL HIP ARTHROPLASTY Right 08/01/2014   Procedure: RIGHT TOTAL HIP ARTHROPLASTY ANTERIOR APPROACH;  Surgeon: Marianna Payment, Merritt;  Location: [facility];  Service: Orthopedics;  Laterality: Right;  . TOTAL HIP ARTHROPLASTY Left 03/27/2016   Procedure: LEFT TOTAL HIP ARTHROPLASTY ANTERIOR APPROACH;  Surgeon: Leandrew Koyanagi, Merritt;  Location: [facility];  Service: Orthopedics;  Laterality: Left;     reports that she quit smoking about 36 years ago. She has a 30.00 pack-year smoking history. She has never used smokeless tobacco. She reports that she does not drink alcohol and does not use drugs.  Allergies  Allergen Reactions  . Percocet [Oxycodone-Acetaminophen] Other (See Comments)  Makes head feel funny    Family History  Problem Relation Age of Onset  . Heart failure Father   . Stroke Mother      Prior to Admission medications   Medication Sig Start Date End Date Taking? Authorizing Provider  amLODipine (NORVASC) 10 MG tablet Take 1 tablet by mouth once daily Patient taking differently: Take 10 mg by mouth daily. 02/29/20  Yes Pixie Casino, Merritt  aspirin EC 81 MG tablet Take 81 mg by mouth daily.   Yes Provider, Historical, Merritt  escitalopram (LEXAPRO) 10 MG tablet  Take 10 mg by mouth daily.   Yes Provider, Historical, Merritt  losartan (COZAAR) 50 MG tablet TAKE 1 TABLET BY MOUTH ONCE DAILY Patient taking differently: Take 50 mg by mouth at bedtime. 07/24/18  Yes Hilty, Nadean Corwin, Merritt  lovastatin (MEVACOR) 20 MG tablet TAKE 1 TABLET BY MOUTH AT BEDTIME Patient taking differently: Take 20 mg by mouth at bedtime. 08/18/19  Yes Hilty, Nadean Corwin, Merritt  Magnesium 250 MG TABS Take 250 mg by mouth every other day.   Yes Provider, Historical, Merritt  metoprolol tartrate (LOPRESSOR) 25 MG tablet Take 0.5 tablets (12.5 mg total) by mouth 2 (two) times daily. 12/27/19  Yes Hilty, Nadean Corwin, Merritt  Omega-3 Fatty Acids (FISH OIL) 1000 MG CAPS Take 2,000 mg by mouth at bedtime.    Yes Provider, Historical, Merritt  TURMERIC CURCUMIN PO Take 1 tablet by mouth daily.   Yes Provider, Historical, Merritt  melatonin 3 MG TABS tablet Take 1 tablet (3 mg total) by mouth at bedtime. Patient not taking: No sig reported 05/16/20   Armando Reichert, Merritt  ondansetron (ZOFRAN) 8 MG tablet Take 1 tablet (8 mg total) by mouth every 8 (eight) hours as needed for nausea or vomiting. Begin 72 hours after day 1 IV infusion. Patient not taking: No sig reported 06/08/20   Ladell Pier, Merritt  prochlorperazine (COMPAZINE) 5 MG tablet Take 1 tablet (5 mg total) by mouth every 6 (six) hours as needed for nausea or vomiting. Patient not taking: No sig reported 06/08/20   Ladell Pier, Merritt    Physical Exam: Vitals:   07/07/20 1759  BP: 130/62  Pulse: 83  Resp: (!) 21  Temp: 98.4 F (36.9 C)  TempSrc: Oral  SpO2: 95%      Constitutional: Frail, chronically ill looking no distress Vitals:   07/07/20 1759  BP: 130/62  Pulse: 83  Resp: (!) 21  Temp: 98.4 F (36.9 C)  TempSrc: Oral  SpO2: 95%   Eyes: PERRL, lids and conjunctivae normal ENMT: Mucous membranes are moist. Posterior pharynx clear of any exudate or lesions.Normal dentition.  Neck: normal, supple, no masses, no thyromegaly Respiratory:  Decreased air entry especially right lung with crackles some rhonchi and increased respiratory effort. No accessory muscle use.  Cardiovascular: Regular rate and rhythm, no murmurs / rubs / gallops. No extremity edema. 2+ pedal pulses. No carotid bruits.  Abdomen: no tenderness, no masses palpated. No hepatosplenomegaly. Bowel sounds positive.  Musculoskeletal: no clubbing / cyanosis. No joint deformity upper and lower extremities. Good ROM, no contractures. Normal muscle tone.  Skin: no rashes, lesions, ulcers. No induration Neurologic: CN 2-12 grossly intact. Sensation intact, DTR normal. Strength 5/5 in all 4.  Psychiatric: Normal judgment and insight. Alert and oriented x 3.  Anxious mood.     Labs on Admission: I have personally reviewed following labs and imaging studies  CBC: Recent Labs  Lab 07/01/20 1446 07/07/20  1812  WBC 2.8* 9.5  NEUTROABS  --  7.5  HGB 12.0 12.7  HCT 35.3* 37.7  MCV 86.3 86.7  PLT 459* 761*   Basic Metabolic Panel: Recent Labs  Lab 07/01/20 1446 07/07/20 1812  NA 126* 127*  K 4.0 4.1  CL 92* 90*  CO2 22 23  GLUCOSE 157* 128*  BUN 14 18  CREATININE 0.65 0.69  CALCIUM 9.0 9.2   GFR: CrCl cannot be calculated (Unknown ideal weight.). Liver Function Tests: Recent Labs  Lab 07/07/20 1812  AST 17  ALT 14  ALKPHOS 65  BILITOT 0.5  PROT 6.4*  ALBUMIN 2.9*   No results for input(s): LIPASE, AMYLASE in the last 168 hours. No results for input(s): AMMONIA in the last 168 hours. Coagulation Profile: Recent Labs  Lab 07/07/20 1837  INR 1.0   Cardiac Enzymes: No results for input(s): CKTOTAL, CKMB, CKMBINDEX, TROPONINI in the last 168 hours. BNP (last 3 results) No results for input(s): PROBNP in the last 8760 hours. HbA1C: No results for input(s): HGBA1C in the last 72 hours. CBG: No results for input(s): GLUCAP in the last 168 hours. Lipid Profile: No results for input(s): CHOL, HDL, LDLCALC, TRIG, CHOLHDL, LDLDIRECT in the last 72  hours. Thyroid Function Tests: No results for input(s): TSH, T4TOTAL, FREET4, T3FREE, THYROIDAB in the last 72 hours. Anemia Panel: No results for input(s): VITAMINB12, FOLATE, FERRITIN, TIBC, IRON, RETICCTPCT in the last 72 hours. Urine analysis:    Component Value Date/Time   COLORURINE YELLOW 12/23/2018 2140   APPEARANCEUR HAZY (A) 12/23/2018 2140   LABSPEC 1.013 12/23/2018 2140   PHURINE 6.0 12/23/2018 2140   GLUCOSEU NEGATIVE 12/23/2018 2140   HGBUR SMALL (A) 12/23/2018 2140   BILIRUBINUR NEGATIVE 12/23/2018 2140   KETONESUR NEGATIVE 12/23/2018 2140   PROTEINUR NEGATIVE 12/23/2018 2140   UROBILINOGEN 0.2 07/21/2014 1356   NITRITE POSITIVE (A) 12/23/2018 2140   LEUKOCYTESUR MODERATE (A) 12/23/2018 2140   Sepsis Labs: @LABRCNTIP (procalcitonin:4,lacticidven:4) ) Recent Results (from the past 240 hour(s))  SARS CORONAVIRUS 2 (TAT 6-24 HRS) Nasopharyngeal Nasopharyngeal Swab     Status: None   Collection Time: 07/01/20  2:52 PM   Specimen: Nasopharyngeal Swab  Result Value Ref Range Status   SARS Coronavirus 2 NEGATIVE NEGATIVE Final    Comment: (NOTE) SARS-CoV-2 target nucleic acids are NOT DETECTED.  The SARS-CoV-2 RNA is generally detectable in upper and lower respiratory specimens during the acute phase of infection. Negative results do not preclude SARS-CoV-2 infection, do not rule out co-infections with other pathogens, and should not be used as the sole basis for treatment or other patient management decisions. Negative results must be combined with clinical observations, patient history, and epidemiological information. The expected result is Negative.  Fact Sheet for Patients: SugarRoll.be  Fact Sheet for Healthcare Providers: https://www.woods-mathews.com/  This test is not yet approved or cleared by the [place] FDA and  has been authorized for detection and/or diagnosis of SARS-CoV-2 by FDA under an Emergency  Use Authorization (EUA). This EUA will remain  in effect (meaning this test can be used) for the duration of the COVID-19 declaration under Se ction 564(b)(1) of the Act, 21 U.S.C. section 360bbb-3(b)(1), unless the authorization is terminated or revoked sooner.  Performed at [facility], [place] [address], [place], [place] [address]      Radiological Exams on Admission: DG Chest 2 View  Result Date: 07/07/2020 CLINICAL DATA:  Increasing shortness of breath for 2 weeks. Known right lung cancer. EXAM:  CHEST - 2 VIEW COMPARISON:  Radiographs 07/05/2020 and 07/01/2020.  CT 05/15/2020. FINDINGS: The heart size and mediastinal contours are stable. There is aortic atherosclerosis. Right suprahilar mass, partial right lung collapse and moderate-sized right pleural effusion have not significantly changed. Known peripheral left upper lobe nodule is not well visualized. There is no pneumothorax or acute osseous abnormality. IMPRESSION: No significant change in right suprahilar mass, partial right lung collapse and moderate right pleural effusion. Electronically Signed   By: Richardean Sale M.D.   On: 07/07/2020 16:26      Assessment/Plan Principal Problem:   Pleural effusion Active Problems:   Essential hypertension   Hyponatremia   Abnormal chest x-ray   Small cell lung cancer (HCC)     #1 shortness of breath with recurrent pleural effusion: Patient has had a CT.  Has some pleural effusion but does not appear to be large.  Symptoms may be related just to the gross of the cancer.  We will get oncology to evaluate.  If pleural fluid can be drained again we will order thoracentesis.  Otherwise patient's symptoms may just be related to her worsening lung cancer.  #2  Small cell lung cancer: Defer to oncology  #3 essential hypertension: Confirm and resume home regimen  #4 hyponatremia: May be SIADH in the patient with known lung cancer.  Still mild's low sodium.  Asymptomatic at this point.   Hydrate and monitor.  #5 generalized debility: May require PT and OT.  Palliative care consultation will be appropriate in such a patient 89 years with lung cancer.   DVT prophylaxis: SCD, patient has been on warfarin Code Status: Partial code Family Communication: No family at bedside Disposition Plan: To be determined Consults called: Consulted to [facility] in the morning Admission status: Inpatient  Severity of Illness: The appropriate patient status for this patient is INPATIENT. Inpatient status is judged to be reasonable and necessary in order to provide the required intensity of service to ensure the patient's safety. The patient's presenting symptoms, physical exam findings, and initial radiographic and laboratory data in the context of their chronic comorbidities is felt to place them at high risk for further clinical deterioration. Furthermore, it is not anticipated that the patient will be medically stable for discharge from the hospital within 2 midnights of admission. The following factors support the patient status of inpatient.   " The patient's presenting symptoms include shortness of breath and cough. " The worrisome physical exam findings include decreased air entry bilateral with crackles. " The initial radiographic and laboratory data are worrisome because of hyponatremia and recurrent lung cancer and effusion. " The chronic co-morbidities include small cell lung cancer.   * I certify that at the point of admission it is my clinical judgment that the patient will require inpatient hospital care spanning beyond 2 midnights from the point of admission due to high intensity of service, high risk for further deterioration and high frequency of surveillance required.Barbette Merino Merritt [facility] Pager [phone]  If 7PM-7AM, please contact night-coverage [email or web address] Password [facility]  07/07/2020, 9:13 PM

## 2020-07-08 ENCOUNTER — Encounter (HOSPITAL_COMMUNITY): Payer: Self-pay | Admitting: Internal Medicine

## 2020-07-08 DIAGNOSIS — J90 Pleural effusion, not elsewhere classified: Secondary | ICD-10-CM

## 2020-07-08 LAB — COMPREHENSIVE METABOLIC PANEL
ALT: 13 U/L (ref 0–44)
AST: 16 U/L (ref 15–41)
Albumin: 2.7 g/dL — ABNORMAL LOW (ref 3.5–5.0)
Alkaline Phosphatase: 63 U/L (ref 38–126)
Anion gap: 11 (ref 5–15)
BUN: 13 mg/dL (ref 8–23)
CO2: 24 mmol/L (ref 22–32)
Calcium: 8.9 mg/dL (ref 8.9–10.3)
Chloride: 93 mmol/L — ABNORMAL LOW (ref 98–111)
Creatinine, Ser: 0.58 mg/dL (ref 0.44–1.00)
GFR, Estimated: >60 mL/min (ref >60)
Glucose, Bld: 113 mg/dL — ABNORMAL HIGH (ref 70–99)
Potassium: 3.9 mmol/L (ref 3.5–5.1)
Sodium: 128 mmol/L — ABNORMAL LOW (ref 135–145)
Total Bilirubin: 0.5 mg/dL (ref 0.3–1.2)
Total Protein: 6 g/dL — ABNORMAL LOW (ref 6.5–8.1)

## 2020-07-08 LAB — CBC
HCT: 36.2 % (ref 36.0–46.0)
Hemoglobin: 12 g/dL (ref 12.0–15.0)
MCH: 28.5 pg (ref 26.0–34.0)
MCHC: 33.1 g/dL (ref 30.0–36.0)
MCV: 86 fL (ref 80.0–100.0)
Platelets: 410 10*3/uL — ABNORMAL HIGH (ref 150–400)
RBC: 4.21 MIL/uL (ref 3.87–5.11)
RDW: 14.7 % (ref 11.5–15.5)
WBC: 9.5 10*3/uL (ref 4.0–10.5)
nRBC: 0 % (ref 0.0–0.2)

## 2020-07-08 MED ORDER — HEPARIN SODIUM (PORCINE) 5000 UNIT/ML IJ SOLN
5000.0000 [IU] | Freq: Three times a day (TID) | INTRAMUSCULAR | Status: DC
  Administered 2020-07-08 – 2020-07-14 (×19): 5000 [IU] via SUBCUTANEOUS
  Filled 2020-07-08 (×19): qty 1

## 2020-07-08 NOTE — ED Notes (Signed)
ED TO INPATIENT HANDOFF REPORT  ED Nurse Name and Phone #: [phone]  S Name/Age/Gender Mary Merritt 87 y.o. female Room/Bed: WA14/WA14  Code Status   Code Status: Partial Code  Home/SNF/Other Home Patient oriented to: self, place, time and situation Is this baseline? Yes   Triage Complete: Triage complete  Chief Complaint Pleural effusion [J90]  Triage Note No notes on file   Allergies Allergies  Allergen Reactions  . Percocet [Oxycodone-Acetaminophen] Other (See Comments)    Makes head feel funny    Level of Care/Admitting Diagnosis ED Disposition    ED Disposition Condition Comment   Admit  Hospital Area: [facility] [[id number]]  Level of Care: Telemetry [5]  Admit to tele based on following criteria: Complex arrhythmia (Bradycardia/Tachycardia)  May admit patient to [facility] or [facility] if equivalent level of care is available:: No  Covid Evaluation: Asymptomatic Screening Protocol (No Symptoms)  Diagnosis: Pleural effusion [[id number]]  Admitting Physician: Elwyn Reach [[id number]]  Attending Physician: Elwyn Reach [[id number]]  Estimated length of stay: past midnight tomorrow  Certification:: I certify this patient will need inpatient services for at least 2 midnights       B Medical/Surgery History Past Medical History:  Diagnosis Date  . AAA (abdominal aortic aneurysm) ([place])   . Anxiety   . Arthritis   . Dyspnea   . Dysrhythmia    bradycardia  . History of bronchitis   . Hyperlipidemia   . Hypertension   . Seasonal allergies    Past Surgical History:  Procedure Laterality Date  . ABDOMINAL AORTIC ANEURYSM REPAIR     2009, Dr. Scot Dock  . ABDOMINAL AORTOGRAM W/LOWER EXTREMITY Right 04/07/2020   Procedure: ABDOMINAL AORTOGRAM W/LOWER EXTREMITY;  Surgeon: Angelia Mould, MD;  Location: [facility];  Service: Cardiovascular;  Laterality: Right;  . CARDIAC CATHETERIZATION     patient thinks it was in 09 with dr  Scot Dock  . cataracts removed    . INCONTINENCE SURGERY    . IR THORACENTESIS ASP PLEURAL SPACE W/IMG GUIDE  06/07/2020  . IR THORACENTESIS ASP PLEURAL SPACE W/IMG GUIDE  06/14/2020  . THORACENTESIS N/A 05/16/2020   Procedure: Mathews Robinsons;  Surgeon: Lanier Clam, MD;  Location: [facility] ENDOSCOPY;  Service: Pulmonary;  Laterality: N/A;  . TOTAL HIP ARTHROPLASTY Right 08/01/2014   Procedure: RIGHT TOTAL HIP ARTHROPLASTY ANTERIOR APPROACH;  Surgeon: Marianna Payment, MD;  Location: [facility];  Service: Orthopedics;  Laterality: Right;  . TOTAL HIP ARTHROPLASTY Left 03/27/2016   Procedure: LEFT TOTAL HIP ARTHROPLASTY ANTERIOR APPROACH;  Surgeon: Leandrew Koyanagi, MD;  Location: [facility];  Service: Orthopedics;  Laterality: Left;     A IV Location/Drains/Wounds Patient Lines/Drains/Airways Status    Active Line/Drains/Airways    Name Placement date Placement time Site Days   Peripheral IV 07/07/20 Left;Upper Arm 07/07/20  1857  Arm  1   External Urinary Catheter 05/14/20  2150  --  55          Intake/Output Last 24 hours No intake or output data in the 24 hours ending 07/08/20 1715  Labs/Imaging Results for orders placed or performed during the hospital encounter of 07/07/20 (from the past 48 hour(s))  Comprehensive metabolic panel     Status: Abnormal   Collection Time: 07/07/20  6:12 PM  Result Value Ref Range   Sodium 127 (L) 135 - 145 mmol/L   Potassium 4.1 3.5 - 5.1 mmol/L   Chloride 90 (L) 98 - 111  mmol/L   CO2 23 22 - 32 mmol/L   Glucose, Bld 128 (H) 70 - 99 mg/dL    Comment: Glucose reference range applies only to samples taken after fasting for at least 8 hours.   BUN 18 8 - 23 mg/dL   Creatinine, Ser 0.69 0.44 - 1.00 mg/dL   Calcium 9.2 8.9 - 10.3 mg/dL   Total Protein 6.4 (L) 6.5 - 8.1 g/dL   Albumin 2.9 (L) 3.5 - 5.0 g/dL   AST 17 15 - 41 U/L   ALT 14 0 - 44 U/L   Alkaline Phosphatase 65 38 - 126 U/L   Total Bilirubin 0.5 0.3 - 1.2 mg/dL   GFR, Estimated >60 >60 mL/min     Comment: (NOTE) Calculated using the CKD-EPI Creatinine Equation (2021)    Anion gap 14 5 - 15    Comment: Performed at [facility], [place] [address], [place], [place] [address]  CBC with Differential     Status: Abnormal   Collection Time: 07/07/20  6:12 PM  Result Value Ref Range   WBC 9.5 4.0 - 10.5 K/uL   RBC 4.35 3.87 - 5.11 MIL/uL   Hemoglobin 12.7 12.0 - 15.0 g/dL   HCT 37.7 36.0 - 46.0 %   MCV 86.7 80.0 - 100.0 fL   MCH 29.2 26.0 - 34.0 pg   MCHC 33.7 30.0 - 36.0 g/dL   RDW 14.6 11.5 - 15.5 %   Platelets 466 (H) 150 - 400 K/uL   nRBC 0.0 0.0 - 0.2 %   Neutrophils Relative % 79 %   Neutro Abs 7.5 1.7 - 7.7 K/uL   Lymphocytes Relative 10 %   Lymphs Abs 0.9 0.7 - 4.0 K/uL   Monocytes Relative 10 %   Monocytes Absolute 0.9 0.1 - 1.0 K/uL   Eosinophils Relative 0 %   Eosinophils Absolute 0.0 0.0 - 0.5 K/uL   Basophils Relative 0 %   Basophils Absolute 0.0 0.0 - 0.1 K/uL   Immature Granulocytes 1 %   Abs Immature Granulocytes 0.06 0.00 - 0.07 K/uL    Comment: Performed at [facility], [place] [address] [place], [place] [address]  Protime-INR     Status: None   Collection Time: 07/07/20  6:37 PM  Result Value Ref Range   Prothrombin Time 12.8 11.4 - 15.2 seconds   INR 1.0 0.8 - 1.2    Comment: (NOTE) INR goal varies based on device and disease states. Performed at [facility], [place] [address] [place], [place] [address]   Resp Panel by RT-PCR (Flu A&B, Covid) Nasopharyngeal Swab     Status: None   Collection Time: 07/07/20  9:25 PM   Specimen: Nasopharyngeal Swab; Nasopharyngeal(NP) swabs in vial transport medium  Result Value Ref Range   SARS Coronavirus 2 by RT PCR NEGATIVE NEGATIVE    Comment: (NOTE) SARS-CoV-2 target nucleic acids are NOT DETECTED.  The SARS-CoV-2 RNA is generally detectable in upper respiratory specimens during the acute phase of infection. The lowest concentration of SARS-CoV-2 viral  copies this assay can detect is 138 copies/mL. A negative result does not preclude SARS-Cov-2 infection and should not be used as the sole basis for treatment or other patient management decisions. A negative result may occur with  improper specimen collection/handling, submission of specimen other than nasopharyngeal swab, presence of viral mutation(s) within the areas targeted by this assay, and inadequate number of viral copies(<138 copies/mL). A negative result must be combined with  clinical observations, patient history, and epidemiological information. The expected result is Negative.  Fact Sheet for Patients:  EntrepreneurPulse.com.au  Fact Sheet for Healthcare Providers:  IncredibleEmployment.be  This test is no t yet approved or cleared by the [place] FDA and  has been authorized for detection and/or diagnosis of SARS-CoV-2 by FDA under an Emergency Use Authorization (EUA). This EUA will remain  in effect (meaning this test can be used) for the duration of the COVID-19 declaration under Section 564(b)(1) of the Act, 21 U.S.C.section 360bbb-3(b)(1), unless the authorization is terminated  or revoked sooner.       Influenza A by PCR NEGATIVE NEGATIVE   Influenza B by PCR NEGATIVE NEGATIVE    Comment: (NOTE) The Xpert Xpress SARS-CoV-2/FLU/RSV plus assay is intended as an aid in the diagnosis of influenza from Nasopharyngeal swab specimens and should not be used as a sole basis for treatment. Nasal washings and aspirates are unacceptable for Xpert Xpress SARS-CoV-2/FLU/RSV testing.  Fact Sheet for Patients: EntrepreneurPulse.com.au  Fact Sheet for Healthcare Providers: IncredibleEmployment.be  This test is not yet approved or cleared by the [place] FDA and has been authorized for detection and/or diagnosis of SARS-CoV-2 by FDA under an Emergency Use Authorization (EUA). This EUA will  remain in effect (meaning this test can be used) for the duration of the COVID-19 declaration under Section 564(b)(1) of the Act, 21 U.S.C. section 360bbb-3(b)(1), unless the authorization is terminated or revoked.  Performed at [facility], [place] [address], [place], [place] [address]   CBC     Status: Abnormal   Collection Time: 07/08/20  4:23 AM  Result Value Ref Range   WBC 9.5 4.0 - 10.5 K/uL   RBC 4.21 3.87 - 5.11 MIL/uL   Hemoglobin 12.0 12.0 - 15.0 g/dL   HCT 36.2 36.0 - 46.0 %   MCV 86.0 80.0 - 100.0 fL   MCH 28.5 26.0 - 34.0 pg   MCHC 33.1 30.0 - 36.0 g/dL   RDW 14.7 11.5 - 15.5 %   Platelets 410 (H) 150 - 400 K/uL   nRBC 0.0 0.0 - 0.2 %    Comment: Performed at [facility], [place] [address], [place], [place] [address]  Comprehensive metabolic panel     Status: Abnormal   Collection Time: 07/08/20  4:23 AM  Result Value Ref Range   Sodium 128 (L) 135 - 145 mmol/L   Potassium 3.9 3.5 - 5.1 mmol/L   Chloride 93 (L) 98 - 111 mmol/L   CO2 24 22 - 32 mmol/L   Glucose, Bld 113 (H) 70 - 99 mg/dL    Comment: Glucose reference range applies only to samples taken after fasting for at least 8 hours.   BUN 13 8 - 23 mg/dL   Creatinine, Ser 0.58 0.44 - 1.00 mg/dL   Calcium 8.9 8.9 - 10.3 mg/dL   Total Protein 6.0 (L) 6.5 - 8.1 g/dL   Albumin 2.7 (L) 3.5 - 5.0 g/dL   AST 16 15 - 41 U/L   ALT 13 0 - 44 U/L   Alkaline Phosphatase 63 38 - 126 U/L   Total Bilirubin 0.5 0.3 - 1.2 mg/dL   GFR, Estimated >60 >60 mL/min    Comment: (NOTE) Calculated using the CKD-EPI Creatinine Equation (2021)    Anion gap 11 5 - 15    Comment: Performed at [facility], [place] [address], [place], [place] [address]   DG Chest 2 View  Result Date:  07/07/2020 CLINICAL DATA:  Increasing shortness of breath for 2 weeks. Known right lung cancer. EXAM: CHEST - 2 VIEW COMPARISON:  Radiographs 07/05/2020 and 07/01/2020.  CT 05/15/2020. FINDINGS: The  heart size and mediastinal contours are stable. There is aortic atherosclerosis. Right suprahilar mass, partial right lung collapse and moderate-sized right pleural effusion have not significantly changed. Known peripheral left upper lobe nodule is not well visualized. There is no pneumothorax or acute osseous abnormality. IMPRESSION: No significant change in right suprahilar mass, partial right lung collapse and moderate right pleural effusion. Electronically Signed   By: Richardean Sale M.D.   On: 07/07/2020 16:26   CT Angio Chest PE W and/or Wo Contrast  Result Date: 07/07/2020 CLINICAL DATA:  Chest pain or shortness of breath.  Lung masses EXAM: CT ANGIOGRAPHY CHEST WITH CONTRAST TECHNIQUE: Multidetector CT imaging of the chest was performed using the standard protocol during bolus administration of intravenous contrast. Multiplanar CT image reconstructions and MIPs were obtained to evaluate the vascular anatomy. CONTRAST:  47mL OMNIPAQUE IOHEXOL 350 MG/ML SOLN COMPARISON:  Chest CT 05/15/2020 FINDINGS: Cardiovascular: Contrast injection is sufficient to demonstrate satisfactory opacification of the pulmonary arteries to the segmental level. There is no pulmonary embolus or evidence of right heart strain. The size of the main pulmonary artery is normal. Heart size is normal, with no pericardial effusion. The course and caliber of the aorta are normal. There is mild atherosclerosis. No acute finding. Mediastinum/Nodes: Unchanged appearance of large prevascular node measuring 2.5 cm. Lungs/Pleura: Large tumor in the right lung with surrounding pleural fluid. There is pleural nodularity along the right minor fissure. Multiple nodular opacities at the right lower lobe. Tumor surrounds the right mainstem bronchus and severely narrows the right upper lobe bronchus. The right pulmonary artery is also encased. Tumor size is increased. Upper Abdomen: Contrast bolus timing is not optimized for evaluation of the  abdominal organs. The visualized portions of the organs of the upper abdomen are normal. Musculoskeletal: No chest wall abnormality. No bony spinal canal stenosis. Review of the MIP images confirms the above findings. IMPRESSION: 1. No pulmonary embolus or acute aortic syndrome. 2. 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 Tumor surrounds the right mainstem bronchus and severely narrows the right upper lobe bronchus. The right pulmonary artery is also encased. 3. Multiple nodular opacities at the right lung base, concerning for metastatic disease versus areas of consolidation. 4. Unchanged appearance of large prevascular node. Aortic Atherosclerosis (ICD10-I70.0). Electronically Signed   By: Ulyses Jarred M.D.   On: 07/07/2020 21:46    Pending Labs Unresulted Labs (From admission, onward)         None      Vitals/Pain Today's Vitals   07/08/20 0925 07/08/20 1245 07/08/20 1655 07/08/20 1700  BP: 133/69 (!) 112/32 129/67 124/66  Pulse: 92 82 88   Resp: (!) 27 (!) 23 (!) 21   Temp:   98.2 F (36.8 C)   TempSrc:   Oral   SpO2: 93% 91% 93%   PainSc:        Isolation Precautions No active isolations  Medications Medications  omega-3 acid ethyl esters (LOVAZA) capsule 2 g (2 g Oral Given 07/07/20 2329)  escitalopram (LEXAPRO) tablet 10 mg (10 mg Oral Given 07/08/20 0926)  losartan (COZAAR) tablet 50 mg (50 mg Oral Given 07/07/20 2312)  pravastatin (PRAVACHOL) tablet 20 mg (has no administration in time range)  metoprolol tartrate (LOPRESSOR) tablet 12.5 mg (12.5 mg Oral Given 07/08/20 0924)  amLODipine (NORVASC) tablet 10 mg (10 mg Oral Given 07/08/20 0924)  magnesium oxide (MAG-OX) tablet 200 mg (200 mg Oral Given 07/08/20 0926)  ondansetron (ZOFRAN) tablet 4 mg (has no administration in time range)    Or  ondansetron [facility]) injection 4 mg (has no administration in time range)  heparin injection 5,000 Units (5,000 Units Subcutaneous Given 07/08/20 1500)  iohexol  (OMNIPAQUE) 350 MG/ML injection 100 mL (75 mLs Intravenous Contrast Given 07/07/20 2107)    Mobility walks with device High fall risk   Focused Assessments .   R Recommendations: See Admitting Provider Note  Report given to:   Additional Notes: n/a

## 2020-07-08 NOTE — Progress Notes (Signed)
Mary Merritt   DOB:03-08-1928   OI#:[id number]   IPJ#:[id number]  Oncology follow up   Subjective: Pt was seen by my partner Dr. Benay Spice for worsening shortness of breath yesterday and was sent to ED for admission. She has dyspnea at rest, on [place] oxygen.   Objective:  Vitals:   07/08/20 0630 07/08/20 0925  BP: (!) 143/72 133/69  Pulse: 93 92  Resp: (!) 23 (!) 27  Temp:    SpO2: 92% 93%    There is no height or weight on file to calculate BMI. No intake or output data in the 24 hours ending 07/08/20 1210   Sclerae unicteric  Oropharynx clear  No peripheral adenopathy  Lungs clear, decreased breath sounds on right  Heart regular rate and rhythm  Abdomen benign  MSK no focal spinal tenderness, no peripheral edema  Neuro nonfocal   CBG (last 3)  No results for input(s): GLUCAP in the last 72 hours.   LAB Urine Studies No results for input(s): UHGB, CRYS in the last 72 hours.  Invalid input(s): UACOL, UAPR, USPG, UPH, UTP, UGL, UKET, UBIL, UNIT, UROB, ULEU, UEPI, UWBC, URBC, UBAC, CAST, UCOM, [place]  Basic Metabolic Panel: Recent Labs  Lab 07/01/20 1446 07/07/20 1812 07/08/20 0423  NA 126* 127* 128*  K 4.0 4.1 3.9  CL 92* 90* 93*  CO2 22 23 24   GLUCOSE 157* 128* 113*  BUN 14 18 13   CREATININE 0.65 0.69 0.58  CALCIUM 9.0 9.2 8.9   GFR CrCl cannot be calculated (Unknown ideal weight.). Liver Function Tests: Recent Labs  Lab 07/07/20 1812 07/08/20 0423  AST 17 16  ALT 14 13  ALKPHOS 65 63  BILITOT 0.5 0.5  PROT 6.4* 6.0*  ALBUMIN 2.9* 2.7*   No results for input(s): LIPASE, AMYLASE in the last 168 hours. No results for input(s): AMMONIA in the last 168 hours. Coagulation profile Recent Labs  Lab 07/07/20 1837  INR 1.0    CBC: Recent Labs  Lab 07/01/20 1446 07/07/20 1812 07/08/20 0423  WBC 2.8* 9.5 9.5  NEUTROABS  --  7.5  --   HGB 12.0 12.7 12.0  HCT 35.3* 37.7 36.2  MCV 86.3 86.7 86.0  PLT 459* 466* 410*   Cardiac Enzymes: No results for  input(s): CKTOTAL, CKMB, CKMBINDEX, TROPONINI in the last 168 hours. BNP: Invalid input(s): POCBNP CBG: No results for input(s): GLUCAP in the last 168 hours. D-Dimer No results for input(s): DDIMER in the last 72 hours. Hgb A1c No results for input(s): HGBA1C in the last 72 hours. Lipid Profile No results for input(s): CHOL, HDL, LDLCALC, TRIG, CHOLHDL, LDLDIRECT in the last 72 hours. Thyroid function studies No results for input(s): TSH, T4TOTAL, T3FREE, THYROIDAB in the last 72 hours.  Invalid input(s): FREET3 Anemia work up No results for input(s): VITAMINB12, FOLATE, FERRITIN, TIBC, IRON, RETICCTPCT in the last 72 hours. Microbiology Recent Results (from the past 240 hour(s))  SARS CORONAVIRUS 2 (TAT 6-24 HRS) Nasopharyngeal Nasopharyngeal Swab     Status: None   Collection Time: 07/01/20  2:52 PM   Specimen: Nasopharyngeal Swab  Result Value Ref Range Status   SARS Coronavirus 2 NEGATIVE NEGATIVE Final    Comment: (NOTE) SARS-CoV-2 target nucleic acids are NOT DETECTED.  The SARS-CoV-2 RNA is generally detectable in upper and lower respiratory specimens during the acute phase of infection. Negative results do not preclude SARS-CoV-2 infection, do not rule out co-infections with other pathogens, and should not be used as the sole basis for  treatment or other patient management decisions. Negative results must be combined with clinical observations, patient history, and epidemiological information. The expected result is Negative.  Fact Sheet for Patients: SugarRoll.be  Fact Sheet for Healthcare Providers: https://www.woods-mathews.com/  This test is not yet approved or cleared by the [place] FDA and  has been authorized for detection and/or diagnosis of SARS-CoV-2 by FDA under an Emergency Use Authorization (EUA). This EUA will remain  in effect (meaning this test can be used) for the duration of the COVID-19 declaration  under Se ction 564(b)(1) of the Act, 21 U.S.C. section 360bbb-3(b)(1), unless the authorization is terminated or revoked sooner.  Performed at [facility], [place] [address], [place], [place] [address]   Resp Panel by RT-PCR (Flu A&B, Covid) Nasopharyngeal Swab     Status: None   Collection Time: 07/07/20  9:25 PM   Specimen: Nasopharyngeal Swab; Nasopharyngeal(NP) swabs in vial transport medium  Result Value Ref Range Status   SARS Coronavirus 2 by RT PCR NEGATIVE NEGATIVE Final    Comment: (NOTE) SARS-CoV-2 target nucleic acids are NOT DETECTED.  The SARS-CoV-2 RNA is generally detectable in upper respiratory specimens during the acute phase of infection. The lowest concentration of SARS-CoV-2 viral copies this assay can detect is 138 copies/mL. A negative result does not preclude SARS-Cov-2 infection and should not be used as the sole basis for treatment or other patient management decisions. A negative result may occur with  improper specimen collection/handling, submission of specimen other than nasopharyngeal swab, presence of viral mutation(s) within the areas targeted by this assay, and inadequate number of viral copies(<138 copies/mL). A negative result must be combined with clinical observations, patient history, and epidemiological information. The expected result is Negative.  Fact Sheet for Patients:  EntrepreneurPulse.com.au  Fact Sheet for Healthcare Providers:  IncredibleEmployment.be  This test is no t yet approved or cleared by the [place] FDA and  has been authorized for detection and/or diagnosis of SARS-CoV-2 by FDA under an Emergency Use Authorization (EUA). This EUA will remain  in effect (meaning this test can be used) for the duration of the COVID-19 declaration under Section 564(b)(1) of the Act, 21 U.S.C.section 360bbb-3(b)(1), unless the authorization is terminated  or revoked sooner.       Influenza  A by PCR NEGATIVE NEGATIVE Final   Influenza B by PCR NEGATIVE NEGATIVE Final    Comment: (NOTE) The Xpert Xpress SARS-CoV-2/FLU/RSV plus assay is intended as an aid in the diagnosis of influenza from Nasopharyngeal swab specimens and should not be used as a sole basis for treatment. Nasal washings and aspirates are unacceptable for Xpert Xpress SARS-CoV-2/FLU/RSV testing.  Fact Sheet for Patients: EntrepreneurPulse.com.au  Fact Sheet for Healthcare Providers: IncredibleEmployment.be  This test is not yet approved or cleared by the [place] FDA and has been authorized for detection and/or diagnosis of SARS-CoV-2 by FDA under an Emergency Use Authorization (EUA). This EUA will remain in effect (meaning this test can be used) for the duration of the COVID-19 declaration under Section 564(b)(1) of the Act, 21 U.S.C. section 360bbb-3(b)(1), unless the authorization is terminated or revoked.  Performed at [facility], [place] [address] [place], [place] [address]       Studies:  DG Chest 2 View  Result Date: 07/07/2020 CLINICAL DATA:  Increasing shortness of breath for 2 weeks. Known right lung cancer. EXAM: CHEST - 2 VIEW COMPARISON:  Radiographs 07/05/2020 and 07/01/2020.  CT 05/15/2020. FINDINGS: The heart size and mediastinal  contours are stable. There is aortic atherosclerosis. Right suprahilar mass, partial right lung collapse and moderate-sized right pleural effusion have not significantly changed. Known peripheral left upper lobe nodule is not well visualized. There is no pneumothorax or acute osseous abnormality. IMPRESSION: No significant change in right suprahilar mass, partial right lung collapse and moderate right pleural effusion. Electronically Signed   By: Richardean Sale M.D.   On: 07/07/2020 16:26   CT Angio Chest PE W and/or Wo Contrast  Result Date: 07/07/2020 CLINICAL DATA:  Chest pain or shortness of  breath.  Lung masses EXAM: CT ANGIOGRAPHY CHEST WITH CONTRAST TECHNIQUE: Multidetector CT imaging of the chest was performed using the standard protocol during bolus administration of intravenous contrast. Multiplanar CT image reconstructions and MIPs were obtained to evaluate the vascular anatomy. CONTRAST:  6mL OMNIPAQUE IOHEXOL 350 MG/ML SOLN COMPARISON:  Chest CT 05/15/2020 FINDINGS: Cardiovascular: Contrast injection is sufficient to demonstrate satisfactory opacification of the pulmonary arteries to the segmental level. There is no pulmonary embolus or evidence of right heart strain. The size of the main pulmonary artery is normal. Heart size is normal, with no pericardial effusion. The course and caliber of the aorta are normal. There is mild atherosclerosis. No acute finding. Mediastinum/Nodes: Unchanged appearance of large prevascular node measuring 2.5 cm. Lungs/Pleura: Large tumor in the right lung with surrounding pleural fluid. There is pleural nodularity along the right minor fissure. Multiple nodular opacities at the right lower lobe. Tumor surrounds the right mainstem bronchus and severely narrows the right upper lobe bronchus. The right pulmonary artery is also encased. Tumor size is increased. Upper Abdomen: Contrast bolus timing is not optimized for evaluation of the abdominal organs. The visualized portions of the organs of the upper abdomen are normal. Musculoskeletal: No chest wall abnormality. No bony spinal canal stenosis. Review of the MIP images confirms the above findings. IMPRESSION: 1. No pulmonary embolus or acute aortic syndrome. 2. 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 Tumor surrounds the right mainstem bronchus and severely narrows the right upper lobe bronchus. The right pulmonary artery is also encased. 3. Multiple nodular opacities at the right lung base, concerning for metastatic disease versus areas of consolidation. 4. Unchanged appearance of large  prevascular node. Aortic Atherosclerosis (ICD10-I70.0). Electronically Signed   By: Ulyses Jarred M.D.   On: 07/07/2020 21:46    Assessment: 87 y.o. female with metastatic small cell lung cancer, presented with worsening dyspnea   1. Metastatic right small cell lung cancer, with pleural, nodes and lung metastasis, s/p first cycle chemo carbo/etoposide/Atezo on 12/22 2. Right malignant pleural effusion  3. Postobstructive pneumonia? 4. Hyponatremia 5. Mild normocytic anemia 6. Thrombocytosis, reactive  7. Hypertension 8. AAA status post repair in 2009 9. PAD 10. Hyperlipidemia 11. Depression  12. Anxiety 13.Hyponatremia    Plan:  -I have personally reviewed her CT chest from yesterday which showed no PE, large tumor in right lung surrounds the right mainstem bronchus and severely narrows the RUL bronchus  -I discussed palliative RT to her primary right lung tumor to shrink her tumor, decrease the obstruction to right bronchus and improve her breathing, she is interested, I spoke with Dr. Lisbeth Renshaw and he will see her on Monday -continue other supportive care per primary team, we appreciate the care   Truitt Merle, MD 07/08/2020  12:10 PM

## 2020-07-08 NOTE — ED Notes (Signed)
I assisted pt to bathroom.

## 2020-07-08 NOTE — Progress Notes (Signed)
PROGRESS NOTE    Mary Merritt  GUY:[id number] DOB: 08-27-1927 DOA: 07/07/2020 PCP: Katherina Mires, MD    Chief Complaint  Patient presents with  . Shortness of Breath    Brief Narrative:  Mary Merritt is a 87 y.o. female with medical history significant of stage IV lung cancer with recurrent malignant pleural effusion,, AAA , Hypertension, Hyperlipidemia was seen yesterday in oncology clinic and referred to ED for admission for worsening sob.   Assessment & Plan:   Principal Problem:   Pleural effusion Active Problems:   Essential hypertension   Hyponatremia   Abnormal chest x-ray   Small cell lung cancer (HCC)   Acute respiratory failure with hypoxia secondary to recurrent malignant pleural effusion and worsening of metastatic lung cancer.  - currently requiring about 2 lit of [place] oxygen to keep sats greater than 90%.  - oncology consulted and plan for radiation oncology.  - will monitor daily CXR's to see if she needs thoracentesis.    Essential hypertension: Well controlled.    Hyponatremia secondary to SIADH.  Asymptomatic.    Generalized deconditioning.  ? Palliative care consult for goals of care.    Hyperlipidemia: Resume pravastatin.     DVT prophylaxis: (Heparin. Code Status: (Full code.  Family Communication: None at bedside.  Disposition:   Status is: Inpatient  Remains inpatient appropriate because:Ongoing diagnostic testing needed not appropriate for outpatient work up and Inpatient level of care appropriate due to severity of illness   Dispo: The patient is from: Home              Anticipated d/c is to: pending.               Anticipated d/c date is: 2 days              Patient currently is not medically stable to d/c.       Consultants:   Oncology  Radiation oncology.  Procedures: none.   Antimicrobials:none.   Subjective: Some sob, feeling better with oxygen.   Objective: Vitals:   07/08/20 0345 07/08/20 0630 07/08/20  0925 07/08/20 1245  BP: (!) 131/59 (!) 143/72 133/69 (!) 112/32  Pulse: 80 93 92 82  Resp: 20 (!) 23 (!) 27 (!) 23  Temp:      TempSrc:      SpO2: 95% 92% 93% 91%   No intake or output data in the 24 hours ending 07/08/20 1320 There were no vitals filed for this visit.  Examination:  General exam: elderly gentleman not in distress on 2l it of [place] oxygen.  Respiratory system: diminished air entry on the right , no wheezing heard. Tachypnea present on 2lit of [place] oxygen Cardiovascular system: S1 & S2 heard, RRR. No pedal edmea.  Gastrointestinal system: Abdomen is nondistended, soft and nontender. Normal bowel sounds heard. Central nervous system: Alert and oriented to person and place.  Extremities: Symmetric 5 x 5 power. Skin: No rashes,  Psychiatry:  Mood & affect appropriate.     Data Reviewed: I have personally reviewed following labs and imaging studies  CBC: Recent Labs  Lab 07/01/20 1446 07/07/20 1812 07/08/20 0423  WBC 2.8* 9.5 9.5  NEUTROABS  --  7.5  --   HGB 12.0 12.7 12.0  HCT 35.3* 37.7 36.2  MCV 86.3 86.7 86.0  PLT 459* 466* 410*    Basic Metabolic Panel: Recent Labs  Lab 07/01/20 1446 07/07/20 1812 07/08/20 0423  NA 126* 127* 128*  K 4.0  4.1 3.9  CL 92* 90* 93*  CO2 22 23 24   GLUCOSE 157* 128* 113*  BUN 14 18 13   CREATININE 0.65 0.69 0.58  CALCIUM 9.0 9.2 8.9    GFR: CrCl cannot be calculated (Unknown ideal weight.).  Liver Function Tests: Recent Labs  Lab 07/07/20 1812 07/08/20 0423  AST 17 16  ALT 14 13  ALKPHOS 65 63  BILITOT 0.5 0.5  PROT 6.4* 6.0*  ALBUMIN 2.9* 2.7*    CBG: No results for input(s): GLUCAP in the last 168 hours.   Recent Results (from the past 240 hour(s))  SARS CORONAVIRUS 2 (TAT 6-24 HRS) Nasopharyngeal Nasopharyngeal Swab     Status: None   Collection Time: 07/01/20  2:52 PM   Specimen: Nasopharyngeal Swab  Result Value Ref Range Status   SARS Coronavirus 2 NEGATIVE NEGATIVE Final    Comment:  (NOTE) SARS-CoV-2 target nucleic acids are NOT DETECTED.  The SARS-CoV-2 RNA is generally detectable in upper and lower respiratory specimens during the acute phase of infection. Negative results do not preclude SARS-CoV-2 infection, do not rule out co-infections with other pathogens, and should not be used as the sole basis for treatment or other patient management decisions. Negative results must be combined with clinical observations, patient history, and epidemiological information. The expected result is Negative.  Fact Sheet for Patients: SugarRoll.be  Fact Sheet for Healthcare Providers: https://www.woods-mathews.com/  This test is not yet approved or cleared by the [place] FDA and  has been authorized for detection and/or diagnosis of SARS-CoV-2 by FDA under an Emergency Use Authorization (EUA). This EUA will remain  in effect (meaning this test can be used) for the duration of the COVID-19 declaration under Se ction 564(b)(1) of the Act, 21 U.S.C. section 360bbb-3(b)(1), unless the authorization is terminated or revoked sooner.  Performed at [facility], [place] [address], [place], [place] [address]   Resp Panel by RT-PCR (Flu A&B, Covid) Nasopharyngeal Swab     Status: None   Collection Time: 07/07/20  9:25 PM   Specimen: Nasopharyngeal Swab; Nasopharyngeal(NP) swabs in vial transport medium  Result Value Ref Range Status   SARS Coronavirus 2 by RT PCR NEGATIVE NEGATIVE Final    Comment: (NOTE) SARS-CoV-2 target nucleic acids are NOT DETECTED.  The SARS-CoV-2 RNA is generally detectable in upper respiratory specimens during the acute phase of infection. The lowest concentration of SARS-CoV-2 viral copies this assay can detect is 138 copies/mL. A negative result does not preclude SARS-Cov-2 infection and should not be used as the sole basis for treatment or other patient management decisions. A negative result may  occur with  improper specimen collection/handling, submission of specimen other than nasopharyngeal swab, presence of viral mutation(s) within the areas targeted by this assay, and inadequate number of viral copies(<138 copies/mL). A negative result must be combined with clinical observations, patient history, and epidemiological information. The expected result is Negative.  Fact Sheet for Patients:  EntrepreneurPulse.com.au  Fact Sheet for Healthcare Providers:  IncredibleEmployment.be  This test is no t yet approved or cleared by the [place] FDA and  has been authorized for detection and/or diagnosis of SARS-CoV-2 by FDA under an Emergency Use Authorization (EUA). This EUA will remain  in effect (meaning this test can be used) for the duration of the COVID-19 declaration under Section 564(b)(1) of the Act, 21 U.S.C.section 360bbb-3(b)(1), unless the authorization is terminated  or revoked sooner.       Influenza A by PCR NEGATIVE NEGATIVE Final  Influenza B by PCR NEGATIVE NEGATIVE Final    Comment: (NOTE) The Xpert Xpress SARS-CoV-2/FLU/RSV plus assay is intended as an aid in the diagnosis of influenza from Nasopharyngeal swab specimens and should not be used as a sole basis for treatment. Nasal washings and aspirates are unacceptable for Xpert Xpress SARS-CoV-2/FLU/RSV testing.  Fact Sheet for Patients: EntrepreneurPulse.com.au  Fact Sheet for Healthcare Providers: IncredibleEmployment.be  This test is not yet approved or cleared by the [place] FDA and has been authorized for detection and/or diagnosis of SARS-CoV-2 by FDA under an Emergency Use Authorization (EUA). This EUA will remain in effect (meaning this test can be used) for the duration of the COVID-19 declaration under Section 564(b)(1) of the Act, 21 U.S.C. section 360bbb-3(b)(1), unless the authorization is terminated  or revoked.  Performed at [facility], [place] [address], [place], [place] [address]          Radiology Studies: DG Chest 2 View  Result Date: 07/07/2020 CLINICAL DATA:  Increasing shortness of breath for 2 weeks. Known right lung cancer. EXAM: CHEST - 2 VIEW COMPARISON:  Radiographs 07/05/2020 and 07/01/2020.  CT 05/15/2020. FINDINGS: The heart size and mediastinal contours are stable. There is aortic atherosclerosis. Right suprahilar mass, partial right lung collapse and moderate-sized right pleural effusion have not significantly changed. Known peripheral left upper lobe nodule is not well visualized. There is no pneumothorax or acute osseous abnormality. IMPRESSION: No significant change in right suprahilar mass, partial right lung collapse and moderate right pleural effusion. Electronically Signed   By: Richardean Sale M.D.   On: 07/07/2020 16:26   CT Angio Chest PE W and/or Wo Contrast  Result Date: 07/07/2020 CLINICAL DATA:  Chest pain or shortness of breath.  Lung masses EXAM: CT ANGIOGRAPHY CHEST WITH CONTRAST TECHNIQUE: Multidetector CT imaging of the chest was performed using the standard protocol during bolus administration of intravenous contrast. Multiplanar CT image reconstructions and MIPs were obtained to evaluate the vascular anatomy. CONTRAST:  40mL OMNIPAQUE IOHEXOL 350 MG/ML SOLN COMPARISON:  Chest CT 05/15/2020 FINDINGS: Cardiovascular: Contrast injection is sufficient to demonstrate satisfactory opacification of the pulmonary arteries to the segmental level. There is no pulmonary embolus or evidence of right heart strain. The size of the main pulmonary artery is normal. Heart size is normal, with no pericardial effusion. The course and caliber of the aorta are normal. There is mild atherosclerosis. No acute finding. Mediastinum/Nodes: Unchanged appearance of large prevascular node measuring 2.5 cm. Lungs/Pleura: Large tumor in the right lung with surrounding  pleural fluid. There is pleural nodularity along the right minor fissure. Multiple nodular opacities at the right lower lobe. Tumor surrounds the right mainstem bronchus and severely narrows the right upper lobe bronchus. The right pulmonary artery is also encased. Tumor size is increased. Upper Abdomen: Contrast bolus timing is not optimized for evaluation of the abdominal organs. The visualized portions of the organs of the upper abdomen are normal. Musculoskeletal: No chest wall abnormality. No bony spinal canal stenosis. Review of the MIP images confirms the above findings. IMPRESSION: 1. No pulmonary embolus or acute aortic syndrome. 2. 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 Tumor surrounds the right mainstem bronchus and severely narrows the right upper lobe bronchus. The right pulmonary artery is also encased. 3. Multiple nodular opacities at the right lung base, concerning for metastatic disease versus areas of consolidation. 4. Unchanged appearance of large prevascular node. Aortic Atherosclerosis (ICD10-I70.0). Electronically Signed   By: Cletus Gash.D.  On: 07/07/2020 21:46        Scheduled Meds: . amLODipine  10 mg Oral Daily  . escitalopram  10 mg Oral Daily  . losartan  50 mg Oral QHS  . magnesium oxide  200 mg Oral QODAY  . metoprolol tartrate  12.5 mg Oral BID  . omega-3 acid ethyl esters  2 g Oral Daily  . pravastatin  20 mg Oral q1800   Continuous Infusions:   LOS: 1 day       Hosie Poisson, MD [facility]   To contact the attending provider between 7A-7P or the covering provider during after hours 7P-7A, please log into the web site [email or web address] and access using universal  password for that web site. If you do not have the password, please call the hospital operator.  07/08/2020, 1:20 PM

## 2020-07-09 ENCOUNTER — Inpatient Hospital Stay (HOSPITAL_COMMUNITY): Payer: Medicare Other

## 2020-07-09 DIAGNOSIS — C3490 Malignant neoplasm of unspecified part of unspecified bronchus or lung: Secondary | ICD-10-CM

## 2020-07-09 DIAGNOSIS — E871 Hypo-osmolality and hyponatremia: Secondary | ICD-10-CM

## 2020-07-09 DIAGNOSIS — I10 Essential (primary) hypertension: Secondary | ICD-10-CM

## 2020-07-09 LAB — BASIC METABOLIC PANEL
Anion gap: 7 (ref 5–15)
BUN: 11 mg/dL (ref 8–23)
CO2: 31 mmol/L (ref 22–32)
Calcium: 8.8 mg/dL — ABNORMAL LOW (ref 8.9–10.3)
Chloride: 91 mmol/L — ABNORMAL LOW (ref 98–111)
Creatinine, Ser: 0.76 mg/dL (ref 0.44–1.00)
GFR, Estimated: >60 mL/min (ref >60)
Glucose, Bld: 210 mg/dL — ABNORMAL HIGH (ref 70–99)
Potassium: 3.8 mmol/L (ref 3.5–5.1)
Sodium: 129 mmol/L — ABNORMAL LOW (ref 135–145)

## 2020-07-09 MED ORDER — ADULT MULTIVITAMIN W/MINERALS CH
1.0000 | ORAL_TABLET | Freq: Every day | ORAL | Status: DC
  Administered 2020-07-09 – 2020-07-14 (×6): 1 via ORAL
  Filled 2020-07-09 (×6): qty 1

## 2020-07-09 MED ORDER — ENSURE ENLIVE PO LIQD
237.0000 mL | Freq: Three times a day (TID) | ORAL | Status: DC
  Administered 2020-07-09 – 2020-07-14 (×12): 237 mL via ORAL

## 2020-07-09 NOTE — Progress Notes (Signed)
PROGRESS NOTE    Mary Merritt  TGY:[id number] DOB: [birth year] DOA: 07/07/2020 PCP: Katherina Mires, MD    Chief Complaint  Patient presents with  . Shortness of Breath    Brief Narrative:  Mary Merritt is a 87 y.o. female with medical history significant of stage IV lung cancer with recurrent malignant pleural effusion,, AAA , Hypertension, Hyperlipidemia was seen yesterday in oncology clinic and referred to ED for admission for worsening sob. CT chest showed Large tumor in the right lung with surrounding pleural fluid.Tumor surrounds the right mainstem bronchus and severely narrows the right upper lobe bronchus. The right pulmonary artery is also encased. Multiple nodules significant for metastatic disease . Oncology consulted, recommended radiation oncology referral for radiation to the right lung tumour.  Pt seen and examined, currently between 1 to 2 lit of [place] oxygen.   Assessment & Plan:   Principal Problem:   Pleural effusion Active Problems:   Essential hypertension   Hyponatremia   Abnormal chest x-ray   Small cell lung cancer (HCC)   Acute respiratory failure with hypoxia secondary to recurrent malignant pleural effusion and worsening of metastatic lung cancer.  - currently requiring between 1 to2 lit of [place] oxygen to keep sats greater than 90%, remains tachypneic,  - oncology consulted and plan for radiation oncology referral tomorrow for radiation treatment to the right lung mass.  - will monitor daily CXR's to see if she needs thoracentesis.    Essential hypertension: Well controlled.     Hyponatremia secondary to SIADH.  Asymptomatic. Sodium has improved to 129.    Generalized deconditioning.  ? Palliative care consult for goals of care.    Hyperlipidemia: Resume pravastatin.     DVT prophylaxis: (Heparin. Code Status: (Full code.  Family Communication: None at bedside.  Disposition:   Status is: Inpatient  Remains inpatient appropriate  because:Ongoing diagnostic testing needed not appropriate for outpatient work up and Inpatient level of care appropriate due to severity of illness   Dispo: The patient is from: Home              Anticipated d/c is to: pending.               Anticipated d/c date is: 2 days              Patient currently is not medically stable to d/c.       Consultants:   Oncology  Radiation oncology.  Procedures: none.   Antimicrobials:none.   Subjective: She reports her breathing is the same as yesterday.  Objective: Vitals:   07/08/20 2108 07/09/20 0512 07/09/20 0949 07/09/20 0950  BP: (!) 141/67 137/64 130/65   Pulse: 93 84  86  Resp: 19 20    Temp: 98.7 F (37.1 C) 98 F (36.7 C)    TempSrc:      SpO2: 94% 94%    Weight:      Height:       No intake or output data in the 24 hours ending 07/09/20 1241 Filed Weights   07/08/20 1809  Weight: 65.3 kg    Examination:  General exam: elderly lady not in distress, on 1 to 2 lit of [place] oxygen.  Respiratory system: diminished air enty on the right, tachypnea present, no wheezing heard.  Cardiovascular system: S1S2 RRR no JVD, no pedal edema.  Gastrointestinal system: Abdomen is soft, NT ND BS+ Central nervous system: alert and oriented to person and place.  Extremities: no cyanosis or  clubbing.  Skin: No rashes,  Psychiatry:  Mood is appropriate.     Data Reviewed: I have personally reviewed following labs and imaging studies  CBC: Recent Labs  Lab 07/07/20 1812 07/08/20 0423  WBC 9.5 9.5  NEUTROABS 7.5  --   HGB 12.7 12.0  HCT 37.7 36.2  MCV 86.7 86.0  PLT 466* 410*    Basic Metabolic Panel: Recent Labs  Lab 07/07/20 1812 07/08/20 0423 07/09/20 1102  NA 127* 128* 129*  K 4.1 3.9 3.8  CL 90* 93* 91*  CO2 23 24 31   GLUCOSE 128* 113* 210*  BUN 18 13 11   CREATININE 0.69 0.58 0.76  CALCIUM 9.2 8.9 8.8*    GFR: Estimated Creatinine Clearance: 42.1 mL/min (by C-G formula based on SCr of 0.76  mg/dL).  Liver Function Tests: Recent Labs  Lab 07/07/20 1812 07/08/20 0423  AST 17 16  ALT 14 13  ALKPHOS 65 63  BILITOT 0.5 0.5  PROT 6.4* 6.0*  ALBUMIN 2.9* 2.7*    CBG: No results for input(s): GLUCAP in the last 168 hours.   Recent Results (from the past 240 hour(s))  SARS CORONAVIRUS 2 (TAT 6-24 HRS) Nasopharyngeal Nasopharyngeal Swab     Status: None   Collection Time: 07/01/20  2:52 PM   Specimen: Nasopharyngeal Swab  Result Value Ref Range Status   SARS Coronavirus 2 NEGATIVE NEGATIVE Final    Comment: (NOTE) SARS-CoV-2 target nucleic acids are NOT DETECTED.  The SARS-CoV-2 RNA is generally detectable in upper and lower respiratory specimens during the acute phase of infection. Negative results do not preclude SARS-CoV-2 infection, do not rule out co-infections with other pathogens, and should not be used as the sole basis for treatment or other patient management decisions. Negative results must be combined with clinical observations, patient history, and epidemiological information. The expected result is Negative.  Fact Sheet for Patients: SugarRoll.be  Fact Sheet for Healthcare Providers: https://www.woods-mathews.com/  This test is not yet approved or cleared by the [place] FDA and  has been authorized for detection and/or diagnosis of SARS-CoV-2 by FDA under an Emergency Use Authorization (EUA). This EUA will remain  in effect (meaning this test can be used) for the duration of the COVID-19 declaration under Se ction 564(b)(1) of the Act, 21 U.S.C. section 360bbb-3(b)(1), unless the authorization is terminated or revoked sooner.  Performed at [facility], [place] [address], [place], [place] [address]   Resp Panel by RT-PCR (Flu A&B, Covid) Nasopharyngeal Swab     Status: None   Collection Time: 07/07/20  9:25 PM   Specimen: Nasopharyngeal Swab; Nasopharyngeal(NP) swabs in vial transport medium   Result Value Ref Range Status   SARS Coronavirus 2 by RT PCR NEGATIVE NEGATIVE Final    Comment: (NOTE) SARS-CoV-2 target nucleic acids are NOT DETECTED.  The SARS-CoV-2 RNA is generally detectable in upper respiratory specimens during the acute phase of infection. The lowest concentration of SARS-CoV-2 viral copies this assay can detect is 138 copies/mL. A negative result does not preclude SARS-Cov-2 infection and should not be used as the sole basis for treatment or other patient management decisions. A negative result may occur with  improper specimen collection/handling, submission of specimen other than nasopharyngeal swab, presence of viral mutation(s) within the areas targeted by this assay, and inadequate number of viral copies(<138 copies/mL). A negative result must be combined with clinical observations, patient history, and epidemiological information. The expected result is Negative.  Fact Sheet for Patients:  EntrepreneurPulse.com.au  Fact Sheet for Healthcare Providers:  IncredibleEmployment.be  This test is no t yet approved or cleared by the [place] FDA and  has been authorized for detection and/or diagnosis of SARS-CoV-2 by FDA under an Emergency Use Authorization (EUA). This EUA will remain  in effect (meaning this test can be used) for the duration of the COVID-19 declaration under Section 564(b)(1) of the Act, 21 U.S.C.section 360bbb-3(b)(1), unless the authorization is terminated  or revoked sooner.       Influenza A by PCR NEGATIVE NEGATIVE Final   Influenza B by PCR NEGATIVE NEGATIVE Final    Comment: (NOTE) The Xpert Xpress SARS-CoV-2/FLU/RSV plus assay is intended as an aid in the diagnosis of influenza from Nasopharyngeal swab specimens and should not be used as a sole basis for treatment. Nasal washings and aspirates are unacceptable for Xpert Xpress SARS-CoV-2/FLU/RSV testing.  Fact Sheet for  Patients: EntrepreneurPulse.com.au  Fact Sheet for Healthcare Providers: IncredibleEmployment.be  This test is not yet approved or cleared by the [place] FDA and has been authorized for detection and/or diagnosis of SARS-CoV-2 by FDA under an Emergency Use Authorization (EUA). This EUA will remain in effect (meaning this test can be used) for the duration of the COVID-19 declaration under Section 564(b)(1) of the Act, 21 U.S.C. section 360bbb-3(b)(1), unless the authorization is terminated or revoked.  Performed at [facility], [place] [address], [place], [place] [address]          Radiology Studies: DG Chest 2 View  Result Date: 07/07/2020 CLINICAL DATA:  Increasing shortness of breath for 2 weeks. Known right lung cancer. EXAM: CHEST - 2 VIEW COMPARISON:  Radiographs 07/05/2020 and 07/01/2020.  CT 05/15/2020. FINDINGS: The heart size and mediastinal contours are stable. There is aortic atherosclerosis. Right suprahilar mass, partial right lung collapse and moderate-sized right pleural effusion have not significantly changed. Known peripheral left upper lobe nodule is not well visualized. There is no pneumothorax or acute osseous abnormality. IMPRESSION: No significant change in right suprahilar mass, partial right lung collapse and moderate right pleural effusion. Electronically Signed   By: Richardean Sale M.D.   On: 07/07/2020 16:26   CT Angio Chest PE W and/or Wo Contrast  Result Date: 07/07/2020 CLINICAL DATA:  Chest pain or shortness of breath.  Lung masses EXAM: CT ANGIOGRAPHY CHEST WITH CONTRAST TECHNIQUE: Multidetector CT imaging of the chest was performed using the standard protocol during bolus administration of intravenous contrast. Multiplanar CT image reconstructions and MIPs were obtained to evaluate the vascular anatomy. CONTRAST:  69mL OMNIPAQUE IOHEXOL 350 MG/ML SOLN COMPARISON:  Chest CT 05/15/2020 FINDINGS:  Cardiovascular: Contrast injection is sufficient to demonstrate satisfactory opacification of the pulmonary arteries to the segmental level. There is no pulmonary embolus or evidence of right heart strain. The size of the main pulmonary artery is normal. Heart size is normal, with no pericardial effusion. The course and caliber of the aorta are normal. There is mild atherosclerosis. No acute finding. Mediastinum/Nodes: Unchanged appearance of large prevascular node measuring 2.5 cm. Lungs/Pleura: Large tumor in the right lung with surrounding pleural fluid. There is pleural nodularity along the right minor fissure. Multiple nodular opacities at the right lower lobe. Tumor surrounds the right mainstem bronchus and severely narrows the right upper lobe bronchus. The right pulmonary artery is also encased. Tumor size is increased. Upper Abdomen: Contrast bolus timing is not optimized for evaluation of the abdominal organs. The visualized portions of the organs of the upper abdomen are normal. Musculoskeletal: No  chest wall abnormality. No bony spinal canal stenosis. Review of the MIP images confirms the above findings. IMPRESSION: 1. No pulmonary embolus or acute aortic syndrome. 2. 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 Tumor surrounds the right mainstem bronchus and severely narrows the right upper lobe bronchus. The right pulmonary artery is also encased. 3. Multiple nodular opacities at the right lung base, concerning for metastatic disease versus areas of consolidation. 4. Unchanged appearance of large prevascular node. Aortic Atherosclerosis (ICD10-I70.0). Electronically Signed   By: Ulyses Jarred M.D.   On: 07/07/2020 21:46        Scheduled Meds: . amLODipine  10 mg Oral Daily  . escitalopram  10 mg Oral Daily  . heparin injection (subcutaneous)  5,000 Units Subcutaneous Q8H  . losartan  50 mg Oral QHS  . magnesium oxide  200 mg Oral QODAY  . metoprolol tartrate  12.5 mg Oral  BID  . omega-3 acid ethyl esters  2 g Oral Daily  . pravastatin  20 mg Oral q1800   Continuous Infusions:   LOS: 2 days       Hosie Poisson, MD [facility]   To contact the attending provider between 7A-7P or the covering provider during after hours 7P-7A, please log into the web site [email or web address] and access using universal [place] password for that web site. If you do not have the password, please call the hospital operator.  07/09/2020, 12:41 PM

## 2020-07-09 NOTE — Progress Notes (Signed)
Initial Nutrition Assessment  DOCUMENTATION CODES:   Not applicable  INTERVENTION:   -Ensure Enlive po TID, each supplement provides 350 kcal and 20 grams of protein -MVI with minerals daily  NUTRITION DIAGNOSIS:   Increased nutrient needs related to chronic illness (metastatic lung cancer) as evidenced by estimated needs.  GOAL:   Patient will meet greater than or equal to 90% of their needs  MONITOR:   PO intake,Supplement acceptance,Labs,Weight trends,Skin,I & O's  REASON FOR ASSESSMENT:   Malnutrition Screening Tool    ASSESSMENT:   Mary Merritt is a 87 y.o. female with medical history significant of stage IV lung cancer with recurrent malignant pleural effusion,, AAA , Hypertension, Hyperlipidemia was seen yesterday in oncology clinic and referred to ED for admission for worsening sob.  Pt admitted with respiratory failure secondary to recurrent malignant pleural effusion and worsening metastatic lung cancer.   Pt unavailable at time of attempted contact.   No meal completions available to assess at this time. RD suspects poor oral intake PTA given wt loss and increased nutritional needs due to cancer.   Reviewed wt hx; pt has experienced a 5.4% wt loss over the past month, which is significant for time frame.   Per oncology notes, plan for possible palliative radiation to rt lung tumor to decrease obstruction to rt bronchus.   Highly suspect malnutrition, however, RD unable to identify at this time. Pt would greatly benefit from addition of oral nutrition supplements.   Palliative care consult pending to discuss goals of care.   Medications reviewed and include magnesium oxide.   Labs reviewed: Na: 129.   Diet Order:   Diet Order            Diet Heart Room service appropriate? Yes; Fluid consistency: Thin  Diet effective now                 EDUCATION NEEDS:   No education needs have been identified at this time  Skin:  Skin Assessment: Reviewed RN  Assessment  Last BM:  07/07/20  Height:   Ht Readings from Last 1 Encounters:  07/08/20 5\' 5"  (1.651 m)    Weight:   Wt Readings from Last 1 Encounters:  07/08/20 65.3 kg    Ideal Body Weight:  56.8 kg  BMI:  Body mass index is 23.96 kg/m.  Estimated Nutritional Needs:   Kcal:  7106-2694  Protein:  85-100 grams  Fluid:  > 1.7 L    Loistine Chance, RD, LDN, [place] Registered Dietitian II Certified Diabetes Care and Education Specialist Please refer to [facility] for RD and/or RD on-call/weekend/after hours pager

## 2020-07-10 ENCOUNTER — Ambulatory Visit
Admit: 2020-07-10 | Discharge: 2020-07-10 | Disposition: A | Discharge status: Home / Self Care | Payer: Medicare Other | Source: Ambulatory Visit | Attending: Radiation Oncology | Admitting: Radiation Oncology

## 2020-07-10 ENCOUNTER — Inpatient Hospital Stay (HOSPITAL_COMMUNITY): Payer: Medicare Other

## 2020-07-10 DIAGNOSIS — C3490 Malignant neoplasm of unspecified part of unspecified bronchus or lung: Secondary | ICD-10-CM

## 2020-07-10 DIAGNOSIS — C3481 Malignant neoplasm of overlapping sites of right bronchus and lung: Secondary | ICD-10-CM | POA: Insufficient documentation

## 2020-07-10 DIAGNOSIS — Z510 Encounter for antineoplastic radiation therapy: Secondary | ICD-10-CM | POA: Insufficient documentation

## 2020-07-10 MED ORDER — ACETAMINOPHEN 325 MG PO TABS
650.0000 mg | ORAL_TABLET | Freq: Four times a day (QID) | ORAL | Status: DC | PRN
  Administered 2020-07-10 – 2020-07-14 (×12): 650 mg via ORAL
  Filled 2020-07-10 (×13): qty 2

## 2020-07-10 MED ORDER — LIDOCAINE HCL 1 % IJ SOLN
INTRAMUSCULAR | Status: AC
  Filled 2020-07-10: qty 20

## 2020-07-10 MED ORDER — POLYETHYLENE GLYCOL 3350 17 G PO PACK
17.0000 g | PACK | Freq: Every day | ORAL | Status: DC
  Administered 2020-07-10 – 2020-07-14 (×3): 17 g via ORAL
  Filled 2020-07-10 (×4): qty 1

## 2020-07-10 NOTE — Plan of Care (Signed)

## 2020-07-10 NOTE — Consult Note (Signed)
Radiation Oncology         [phone] ________________________________  Name: Mary Merritt        MRN: [id number]  Date of Service: 07/10/20 DOB: 08-Jan-1928  FB:PZWCHEN, Jannifer Rodney, MD    REFERRING PHYSICIAN: Dr. Burr Medico  DIAGNOSIS: The primary encounter diagnosis was Dyspnea, unspecified type. A diagnosis of Pleural effusion was also pertinent to this visit.   HISTORY OF PRESENT ILLNESS: Mary Merritt is a 87 y.o. female seen at the request of Dr. Burr Medico for consideration of palliative radiotherapy to the chest.  The patient was originally found to have a T4 tumor in her right chest in November 2021.  There was concern at the time with invasion into the right hilum and mediastinum with extensive mediastinal and right hilar adenopathy, extensive interstitial spread of tumor in the right lung and large loculated right pleural effusion there was also a secondary lesion in the lingula that possibly represented a separate primary measuring up to 13 mm.  She underwent thoracentesis on 05/16/2020 which showed malignant cells possible small cell carcinoma further cytology on 05/26/2020 with repeat thoracentesis showed malignant cells again with possibility of small cell carcinoma.  Right supraclavicular lymph node on 06/09/2020 confirmed small cell carcinoma histology and she subsequently began a modified regimen of carboplatin etoposide plus atezolizumab on 06/14/2020.  Though her effusion was felt to be stable on x-ray on 07/01/2020, she was noticing increasing dyspnea and presented to the clinic to see Dr. Benay Spice on 07/07/2020 and rapid A. fib.  With 2 L of oxygen in the clinic only minimal improvement was noted with her shortness of breath, and she was subsequently directed to the emergency room for admission.  Since her hospitalization Repeat CT angio was performed that day revealing a large tumor in the right lung with surrounding pleural fluid and pleural nodularity along the right minor fissure with multiple  nodular opacities in the right lower lobe, her tumor surrounds the right mainstem bronchus and severely narrows the right upper lobe bronchus, the right pulmonary artery is also encased in tumor size has increased.  There was also an unchanged appearance of a large prevascular node and chronic stigmata of atherosclerotic disease of the aorta.  There was interval worsening of right pleural effusion on a chest x-ray yesterday, and she remains between 1 to 2 L of oxygen via nasal cannula her electrolytes are also being managed for SIADH, and we have been asked to consider palliative radiotherapy to the chest.    PREVIOUS RADIATION THERAPY: No   PAST MEDICAL HISTORY:  Past Medical History:  Diagnosis Date  . AAA (abdominal aortic aneurysm) ([place])   . Anxiety   . Arthritis   . Dyspnea   . Dysrhythmia    bradycardia  . History of bronchitis   . Hyperlipidemia   . Hypertension   . Seasonal allergies        PAST SURGICAL HISTORY: Past Surgical History:  Procedure Laterality Date  . ABDOMINAL AORTIC ANEURYSM REPAIR     2009, Dr. Scot Dock  . ABDOMINAL AORTOGRAM W/LOWER EXTREMITY Right 04/07/2020   Procedure: ABDOMINAL AORTOGRAM W/LOWER EXTREMITY;  Surgeon: Angelia Mould, MD;  Location: [facility];  Service: Cardiovascular;  Laterality: Right;  . CARDIAC CATHETERIZATION     patient thinks it was in 09 with dr Scot Dock  . cataracts removed    . INCONTINENCE SURGERY    . IR THORACENTESIS ASP PLEURAL SPACE W/IMG GUIDE  06/07/2020  . IR THORACENTESIS ASP PLEURAL  SPACE W/IMG GUIDE  06/14/2020  . THORACENTESIS N/A 05/16/2020   Procedure: Mathews Robinsons;  Surgeon: Lanier Clam, MD;  Location: [facility];  Service: Pulmonary;  Laterality: N/A;  . TOTAL HIP ARTHROPLASTY Right 08/01/2014   Procedure: RIGHT TOTAL HIP ARTHROPLASTY ANTERIOR APPROACH;  Surgeon: Marianna Payment, MD;  Location: [facility];  Service: Orthopedics;  Laterality: Right;  . TOTAL HIP ARTHROPLASTY Left  03/27/2016   Procedure: LEFT TOTAL HIP ARTHROPLASTY ANTERIOR APPROACH;  Surgeon: Leandrew Koyanagi, MD;  Location: [facility];  Service: Orthopedics;  Laterality: Left;     FAMILY HISTORY:  Family History  Problem Relation Age of Onset  . Heart failure Father   . Stroke Mother      SOCIAL HISTORY:  reports that she quit smoking about 36 years ago. She has a 30.00 pack-year smoking history. She has never used smokeless tobacco. She reports that she does not drink alcohol and does not use drugs. The patient is widowed and lives in [place]. Her children are actively involved in her care.   ALLERGIES: Percocet [oxycodone-acetaminophen]   MEDICATIONS:  Current Facility-Administered Medications  Medication Dose Route Frequency Provider Last Rate Last Admin  . acetaminophen (TYLENOL) tablet 650 mg  650 mg Oral Q6H PRN Lovey Newcomer T, NP   650 mg at 07/10/20 0043  . amLODipine (NORVASC) tablet 10 mg  10 mg Oral Daily Elwyn Reach, MD   10 mg at 07/09/20 0949  . escitalopram (LEXAPRO) tablet 10 mg  10 mg Oral Daily Elwyn Reach, MD   10 mg at 07/09/20 0951  . feeding supplement (ENSURE ENLIVE / ENSURE PLUS) liquid 237 mL  237 mL Oral TID BM Hosie Poisson, MD   237 mL at 07/09/20 2041  . heparin injection 5,000 Units  5,000 Units Subcutaneous Q8H Hosie Poisson, MD   5,000 Units at 07/10/20 0522  . losartan (COZAAR) tablet 50 mg  50 mg Oral QHS Elwyn Reach, MD   50 mg at 07/09/20 2041  . magnesium oxide (MAG-OX) tablet 200 mg  200 mg Oral Lerry Paterson L, MD   200 mg at 07/08/20 0926  . metoprolol tartrate (LOPRESSOR) tablet 12.5 mg  12.5 mg Oral BID Gala Romney L, MD   12.5 mg at 07/09/20 2042  . multivitamin with minerals tablet 1 tablet  1 tablet Oral Daily Hosie Poisson, MD   1 tablet at 07/09/20 1459  . omega-3 acid ethyl esters (LOVAZA) capsule 2 g  2 g Oral Daily Elwyn Reach, MD   2 g at 07/09/20 2041  . ondansetron (ZOFRAN) tablet 4 mg  4 mg Oral Q6H PRN Elwyn Reach, MD       Or  . ondansetron (ZOFRAN) injection 4 mg  4 mg Intravenous Q6H PRN Gala Romney L, MD      . pravastatin (PRAVACHOL) tablet 20 mg  20 mg Oral q1800 Elwyn Reach, MD   20 mg at 07/09/20 1805     REVIEW OF SYSTEMS: On review of systems, the patient reports that she is doing fairly well but is still short of breath. She denies any other concerns at this time.   PHYSICAL EXAM:  Wt Readings from Last 3 Encounters:  07/08/20 144 lb (65.3 kg)  06/22/20 146 lb 12.8 oz (66.6 kg)  06/14/20 149 lb 11.2 oz (67.9 kg)   Temp Readings from Last 3 Encounters:  07/10/20 97.8 F (36.6 C) (Oral)  07/09/20 97.9 F (36.6 C)  07/07/20 98.2  F (36.8 C) (Oral)   BP Readings from Last 3 Encounters:  07/09/20 130/61  07/10/20 129/61  07/07/20 (!) 115/58   Pulse Readings from Last 3 Encounters:  07/09/20 76  07/10/20 73  07/07/20 88   Pain Assessment Pain Score: 0-No pain/10  Unable to assess due to encounter type.   ECOG = 4  0 - Asymptomatic (Fully active, able to carry on all predisease activities without restriction)  1 - Symptomatic but completely ambulatory (Restricted in physically strenuous activity but ambulatory and able to carry out work of a light or sedentary nature. For example, light housework, office work)  2 - Symptomatic, <50% in bed during the day (Ambulatory and capable of all self care but unable to carry out any work activities. Up and about more than 50% of waking hours)  3 - Symptomatic, >50% in bed, but not bedbound (Capable of only limited self-care, confined to bed or chair 50% or more of waking hours)  4 - Bedbound (Completely disabled. Cannot carry on any self-care. Totally confined to bed or chair)  5 - Death   Eustace Pen MM, Creech RH, Tormey DC, et al. [phone]). "Toxicity and response criteria of the [facility]". [place] Oncol. 5 (6): 649-55    LABORATORY DATA:  Lab Results  Component Value Date   WBC  9.5 07/08/2020   HGB 12.0 07/08/2020   HCT 36.2 07/08/2020   MCV 86.0 07/08/2020   PLT 410 (H) 07/08/2020   Lab Results  Component Value Date   NA 129 (L) 07/09/2020   K 3.8 07/09/2020   CL 91 (L) 07/09/2020   CO2 31 07/09/2020   Lab Results  Component Value Date   ALT 13 07/08/2020   AST 16 07/08/2020   ALKPHOS 63 07/08/2020   BILITOT 0.5 07/08/2020      RADIOGRAPHY: DG Chest 1 View  Result Date: 06/21/2020 CLINICAL DATA:  Status post thoracentesis EXAM: CHEST  1 VIEW COMPARISON:  June 14, 2020 chest radiograph and chest CT May 16, 2019 FINDINGS: No pneumothorax. Small residual pleural effusion on the right. There is persistent airspace opacity throughout the right upper lobe. Patchy areas of airspace opacity noted in the left mid lung and left base. Heart size and pulmonary vascular normal. No adenopathy. There is aortic atherosclerosis. No bone lesions. IMPRESSION: No pneumothorax. Airspace opacity persists in the right upper lobe. The known mass in the right upper lobe is not well delineated given the surrounding apparent pneumonitis. Small residual right pleural effusion with right base atelectasis. New ill-defined airspace opacity left mid lung and left base may represent foci of developing pneumonia. Stable cardiac silhouette. Aortic Atherosclerosis (ICD10-I70.0). Electronically Signed   By: Lowella Grip III M.D.   On: 06/21/2020 10:59   DG Chest 1 View  Result Date: 06/14/2020 CLINICAL DATA:  Pleural effusion. Small cell lung cancer. Status post right thoracentesis. EXAM: CHEST  1 VIEW COMPARISON:  06/07/2020 FINDINGS: The cardiomediastinal silhouette is unchanged. Aortic atherosclerosis is noted. Extensive right upper lobe density corresponding to a known large mass and surrounding airspace opacity is unchanged. A moderate right pleural effusion is stable to minimally smaller than on the prior study. No pneumothorax is identified. No acute osseous abnormality is  seen. IMPRESSION: No pneumothorax following thoracentesis. Moderate residual right pleural effusion. Unchanged right upper lobe density corresponding to known mass and surrounding airspace opacity. Electronically Signed   By: Logan Bores M.D.   On: 06/14/2020 10:04   DG Chest 2  View  Result Date: 07/07/2020 CLINICAL DATA:  Increasing shortness of breath for 2 weeks. Known right lung cancer. EXAM: CHEST - 2 VIEW COMPARISON:  Radiographs 07/05/2020 and 07/01/2020.  CT 05/15/2020. FINDINGS: The heart size and mediastinal contours are stable. There is aortic atherosclerosis. Right suprahilar mass, partial right lung collapse and moderate-sized right pleural effusion have not significantly changed. Known peripheral left upper lobe nodule is not well visualized. There is no pneumothorax or acute osseous abnormality. IMPRESSION: No significant change in right suprahilar mass, partial right lung collapse and moderate right pleural effusion. Electronically Signed   By: Richardean Sale M.D.   On: 07/07/2020 16:26   DG Chest 2 View  Result Date: 07/05/2020 CLINICAL DATA:  87 year old female with pleural effusion. EXAM: CHEST - 2 VIEW COMPARISON:  Chest radiograph dated 07/01/2020. FINDINGS: Slight interval increase in the right pleural effusion since the prior radiograph. There is diffuse right lung opacity. Left lung base atelectasis. No pneumothorax. Stable cardiomediastinal silhouette. No acute osseous pathology. IMPRESSION: Slight interval increase in the right pleural effusion since the prior radiograph. Electronically Signed   By: Anner Crete M.D.   On: 07/05/2020 16:24   DG Chest 2 View  Result Date: 07/01/2020 CLINICAL DATA:  Shortness of breath. History of pleural effusion and thoracentesis. EXAM: CHEST - 2 VIEW COMPARISON:  Chest x-ray dated 06/21/2020. FINDINGS: Reaccumulating RIGHT pleural effusion, moderate in size. Stable chronic airspace opacity within the RIGHT upper lobe, better demonstrated  as a mass on chest CT of 05/15/2020. No new findings on the LEFT. Heart size and mediastinal contours appear stable. IMPRESSION: 1. Reaccumulating RIGHT pleural effusion, moderate in size. 2. Stable chronic airspace opacity within the RIGHT upper lobe, better demonstrated as a mass on chest CT of 05/15/2020. Electronically Signed   By: Franki Cabot M.D.   On: 07/01/2020 15:15   CT Angio Chest PE W and/or Wo Contrast  Result Date: 07/07/2020 CLINICAL DATA:  Chest pain or shortness of breath.  Lung masses EXAM: CT ANGIOGRAPHY CHEST WITH CONTRAST TECHNIQUE: Multidetector CT imaging of the chest was performed using the standard protocol during bolus administration of intravenous contrast. Multiplanar CT image reconstructions and MIPs were obtained to evaluate the vascular anatomy. CONTRAST:  25mL OMNIPAQUE IOHEXOL 350 MG/ML SOLN COMPARISON:  Chest CT 05/15/2020 FINDINGS: Cardiovascular: Contrast injection is sufficient to demonstrate satisfactory opacification of the pulmonary arteries to the segmental level. There is no pulmonary embolus or evidence of right heart strain. The size of the main pulmonary artery is normal. Heart size is normal, with no pericardial effusion. The course and caliber of the aorta are normal. There is mild atherosclerosis. No acute finding. Mediastinum/Nodes: Unchanged appearance of large prevascular node measuring 2.5 cm. Lungs/Pleura: Large tumor in the right lung with surrounding pleural fluid. There is pleural nodularity along the right minor fissure. Multiple nodular opacities at the right lower lobe. Tumor surrounds the right mainstem bronchus and severely narrows the right upper lobe bronchus. The right pulmonary artery is also encased. Tumor size is increased. Upper Abdomen: Contrast bolus timing is not optimized for evaluation of the abdominal organs. The visualized portions of the organs of the upper abdomen are normal. Musculoskeletal: No chest wall abnormality. No bony spinal  canal stenosis. Review of the MIP images confirms the above findings. IMPRESSION: 1. No pulmonary embolus or acute aortic syndrome. 2. 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 Tumor surrounds the right mainstem bronchus and severely narrows the right upper lobe bronchus. The right  pulmonary artery is also encased. 3. Multiple nodular opacities at the right lung base, concerning for metastatic disease versus areas of consolidation. 4. Unchanged appearance of large prevascular node. Aortic Atherosclerosis (ICD10-I70.0). Electronically Signed   By: Ulyses Jarred M.D.   On: 07/07/2020 21:46   DG CHEST PORT 1 VIEW  Result Date: 07/09/2020 CLINICAL DATA:  Pleural effusion shortness of breath. EXAM: PORTABLE CHEST 1 VIEW COMPARISON:  07/07/2020 FINDINGS: Lungs are adequately inflated demonstrate no change in patient's known masslike density over the right suprahilar region. There is interval worsening of a moderate size right pleural effusion. Likely associated atelectasis in the right base. Left lung is clear. Cardiomediastinal silhouette and remainder of the exam is unchanged. IMPRESSION: 1. Interval worsening moderate size right pleural effusion likely with associated atelectasis right base. 2. Stable masslike density over the right suprahilar region. Electronically Signed   By: Marin Olp M.D.   On: 07/09/2020 14:27   DG Chest Port 1 View  Result Date: 07/01/2020 CLINICAL DATA:  Post right-sided thoracentesis. EXAM: PORTABLE CHEST 1 VIEW COMPARISON:  Radiograph 07/01/2020, CT 05/15/2020 FINDINGS: Slight decreased right pleural effusion after thoracentesis. No visualized pneumothorax or pleural line. Opacity in the right lung most prominent in the suprahilar region is unchanged from earlier today. Stable heart size and mediastinal contours. Aortic atherosclerosis. Mild atelectasis at the left lung base. Lingular nodule on CT is not well seen by radiograph. Bones are diffusely under  mineralized. Remote left rib fractures. IMPRESSION: 1. Slight decreased right pleural effusion after thoracentesis. No visualized pneumothorax. 2. Unchanged right upper lung zone opacity, right upper lobe mass on prior CT. Electronically Signed   By: Keith Rake M.D.   On: 07/01/2020 19:39   DG ABD ACUTE 2+V W 1V CHEST  Result Date: 06/16/2020 CLINICAL DATA:  Constipation.  History of lung cancer. EXAM: DG ABDOMEN ACUTE WITH 1 VIEW CHEST COMPARISON:  Chest x-ray dated 06/14/2020 FINDINGS: Diffuse airspace opacification is again noted throughout the right lung field. There is a right-sided pleural effusion. There is right-sided volume loss. Aortic calcifications are noted. The heart size is stable. There is no pneumothorax. There is a large amount of stool throughout the colon. The bowel gas pattern is nonobstructive. There are degenerative changes throughout the lumbar spine. The patient is status post prior total hip arthroplasty bilaterally. IMPRESSION: 1. Nonobstructive bowel gas pattern. 2. Large amount of stool throughout the colon. 3. Stable appearance of the chest. Electronically Signed   By: Constance Holster M.D.   On: 06/16/2020 18:35   IR THORACENTESIS ASP PLEURAL SPACE W/IMG GUIDE  Result Date: 06/14/2020 INDICATION: Patient with recurrent right malignant effusion. Request is made for therapeutic right thoracentesis. EXAM: ULTRASOUND GUIDED THERAPEUTIC RIGHT THORACENTESIS MEDICATIONS: 10 mL 1% lidocaine COMPLICATIONS: None immediate. PROCEDURE: An ultrasound guided thoracentesis was thoroughly discussed with the patient and questions answered. The benefits, risks, alternatives and complications were also discussed. The patient understands and wishes to proceed with the procedure. Written consent was obtained. Ultrasound was performed to localize and mark an adequate pocket of fluid in the right chest. The area was then prepped and draped in the normal sterile fashion. 1% Lidocaine was  used for local anesthesia. Under ultrasound guidance a 6 Fr Safe-T-Centesis catheter was introduced. Thoracentesis was performed. The catheter was removed and a dressing applied. FINDINGS: A total of approximately 1.4 liters of dark, yellow fluid was removed. Samples were sent to the laboratory as requested by the clinical team. IMPRESSION: Successful ultrasound guided therapeutic right thoracentesis  yielding 1.4 liters of pleural fluid. Read by: Brynda Greathouse PA-C Electronically Signed   By: Sandi Mariscal M.D.   On: 06/14/2020 12:39   US Thoracentesis Asp Pleural space w/IMG guide  Result Date: 06/21/2020 INDICATION: Patient with a history of lung cancer and recurrent right pleural effusion. Interventional radiology asked to perform a therapeutic thoracentesis. EXAM: ULTRASOUND GUIDED THORACENTESIS MEDICATIONS: 1% lidocaine 10 mL COMPLICATIONS: None immediate. PROCEDURE: An ultrasound guided thoracentesis was thoroughly discussed with the patient and questions answered. The benefits, risks, alternatives and complications were also discussed. The patient understands and wishes to proceed with the procedure. Written consent was obtained. Ultrasound was performed to localize and mark an adequate pocket of fluid in the right chest. The area was then prepped and draped in the normal sterile fashion. 1% Lidocaine was used for local anesthesia. Under ultrasound guidance a 6 Fr Safe-T-Centesis catheter was introduced. Thoracentesis was performed. The catheter was removed and a dressing applied. FINDINGS: A total of approximately 1 L of amber-colored fluid was removed. IMPRESSION: Successful ultrasound guided right thoracentesis yielding 1 L of pleural fluid. Read by: Soyla Dryer, NP Electronically Signed   By: Jerilynn Mages.  Shick M.D.   On: 06/21/2020 11:13       IMPRESSION/PLAN: 1. 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  The patient's course has been reviewed including her  images.  Dr. Lisbeth Renshaw is not in the office today to evaluate her case, but she appears to be a good candidate for palliative radiotherapy. I spent time discussing the rationale would be to try to help reduce the tumor in the lung so she would have improvement of breathing and prevent further collapse and or development of pneumonia.  We discussed the risks, benefits, short, and long term effects of radiotherapy, as well as the palliative intent, and the patient is interested in proceeding. We reviewed  the delivery and logistics of radiotherapy and I anticipate Dr. Lisbeth Renshaw would recommend a course of 3 weeks of radiotherapy since she has plans to resume chemotherapy. We will simulate tomorrow and begin treatment tomorrow as well. In the meantime I've also reached out to her attending to see what they think about asking IR for another thoracentesis. The patient is aware that the pleural effusion would not respond to radiotherapy and is interested in receiving chemotherapy as an inpt as well if possible.  In a visit lasting 60 minutes, greater than 50% of the time was spent by phone and in floor time discussing the patient's condition, in preparation for the discussion, and coordinating the patient's care. Her daughter Donald Siva was also on the call today and actively engaged in her our discussion.    Carola Rhine, PAC

## 2020-07-10 NOTE — Progress Notes (Signed)
Palliative care brief note  Consult received. Spoke with Dr. Karleen Hampshire.  Oncology evaluated Mary Merritt and recommendation made for evaluation for radiation therapy.  Will hold on palliative consult at this time and await recommendations by radiation oncology.  Please call if we can be of assistance in the care in the interim.  [name], MD [place] Palliative Medicine Team [phone]  NO CHARGE NOTE

## 2020-07-10 NOTE — Progress Notes (Signed)
PROGRESS NOTE    Mary Merritt  UEA:[id number] DOB: [birth year] DOA: 07/07/2020 PCP: Katherina Mires, MD    Chief Complaint  Patient presents with  . Shortness of Breath    Brief Narrative:  Mary Merritt is a 87 y.o. female with medical history significant of stage IV lung cancer with recurrent malignant pleural effusion,, AAA , Hypertension, Hyperlipidemia was seen yesterday in oncology clinic and referred to ED for admission for worsening sob. CT chest showed Large tumor in the right lung with surrounding pleural fluid.Tumor surrounds the right mainstem bronchus and severely narrows the right upper lobe bronchus. The right pulmonary artery is also encased. Multiple nodules significant for metastatic disease . Oncology consulted, recommended radiation oncology referral for radiation to the right lung tumour.  Patient seen and examined at bedside, she is requesting to see if chemotherapy can be attempted inpatient while she is getting palliative radiation.  Assessment & Plan:   Principal Problem:   Pleural effusion Active Problems:   Essential hypertension   Hyponatremia   Abnormal chest x-ray   Small cell lung cancer (HCC)   Acute respiratory failure with hypoxia secondary to recurrent malignant pleural effusion and worsening of metastatic lung cancer.  - currently requiring between 1 to2 lit of [place] oxygen to keep sats greater than 90%, remains tachypneic on talking or ambulation. - oncology consulted and plan for palliative radiation today after ultrasound thoracentesis. Ultrasound thoracentesis ordered and 1.1 L amber-colored fluid removed.    Essential hypertension: Well-controlled   Hyponatremia secondary to SIADH.  Asymptomatic. Sodium has improved to 129.  Repeat BMP tomorrow   Generalized deconditioning.  ? Palliative care consult for goals of care.    Hyperlipidemia: Resume pravastatin.     DVT prophylaxis: (Heparin. Code Status: (Full code.  Family  Communication: None at bedside.  Discussed with daughter on 07/09/18 22 Disposition:   Status is: Inpatient  Remains inpatient appropriate because:Ongoing diagnostic testing needed not appropriate for outpatient work up and Inpatient level of care appropriate due to severity of illness   Dispo: The patient is from: Home              Anticipated d/c is to: pending.               Anticipated d/c date is: 2 days              Patient currently is not medically stable to d/c.       Consultants:   Oncology  Radiation oncology.  Procedures: none.   Antimicrobials:none.   Subjective: No new complaints at this time. Objective: Vitals:   07/10/20 0506 07/10/20 1022 07/10/20 1042 07/10/20 1123  BP:  (!) 110/38 (!) 106/50 (!) 108/51  Pulse:    63  Resp:    18  Temp: 97.8 F (36.6 C)   97.8 F (36.6 C)  TempSrc: Oral   Oral  SpO2:    97%  Weight:      Height:       No intake or output data in the 24 hours ending 07/10/20 1336 Filed Weights   07/08/20 1809  Weight: 65.3 kg    Examination:  General exam: Elderly lady not in any kind of distress Respiratory system: Diminished air entry on the right, on 1 L of nasal cannula oxygen at this time. Cardiovascular system: S1-S2 heard, regular rate rhythm, no JVD, no pedal edema Gastrointestinal system: Abdomen is soft, nontender, nondistended, bowel sounds normal Central nervous system: Alert and oriented  to place and person only Extremities: No cyanosis or clubbing Skin: No rashes seen,  Psychiatry: Mood is appropriate    Data Reviewed: I have personally reviewed following labs and imaging studies  CBC: Recent Labs  Lab 07/07/20 1812 07/08/20 0423  WBC 9.5 9.5  NEUTROABS 7.5  --   HGB 12.7 12.0  HCT 37.7 36.2  MCV 86.7 86.0  PLT 466* 410*    Basic Metabolic Panel: Recent Labs  Lab 07/07/20 1812 07/08/20 0423 07/09/20 1102  NA 127* 128* 129*  K 4.1 3.9 3.8  CL 90* 93* 91*  CO2 23 24 31   GLUCOSE 128*  113* 210*  BUN 18 13 11   CREATININE 0.69 0.58 0.76  CALCIUM 9.2 8.9 8.8*    GFR: Estimated Creatinine Clearance: 42.1 mL/min (by C-G formula based on SCr of 0.76 mg/dL).  Liver Function Tests: Recent Labs  Lab 07/07/20 1812 07/08/20 0423  AST 17 16  ALT 14 13  ALKPHOS 65 63  BILITOT 0.5 0.5  PROT 6.4* 6.0*  ALBUMIN 2.9* 2.7*    CBG: No results for input(s): GLUCAP in the last 168 hours.   Recent Results (from the past 240 hour(s))  SARS CORONAVIRUS 2 (TAT 6-24 HRS) Nasopharyngeal Nasopharyngeal Swab     Status: None   Collection Time: 07/01/20  2:52 PM   Specimen: Nasopharyngeal Swab  Result Value Ref Range Status   SARS Coronavirus 2 NEGATIVE NEGATIVE Final    Comment: (NOTE) SARS-CoV-2 target nucleic acids are NOT DETECTED.  The SARS-CoV-2 RNA is generally detectable in upper and lower respiratory specimens during the acute phase of infection. Negative results do not preclude SARS-CoV-2 infection, do not rule out co-infections with other pathogens, and should not be used as the sole basis for treatment or other patient management decisions. Negative results must be combined with clinical observations, patient history, and epidemiological information. The expected result is Negative.  Fact Sheet for Patients: SugarRoll.be  Fact Sheet for Healthcare Providers: https://www.woods-mathews.com/  This test is not yet approved or cleared by the [place] FDA and  has been authorized for detection and/or diagnosis of SARS-CoV-2 by FDA under an Emergency Use Authorization (EUA). This EUA will remain  in effect (meaning this test can be used) for the duration of the COVID-19 declaration under Se ction 564(b)(1) of the Act, 21 U.S.C. section 360bbb-3(b)(1), unless the authorization is terminated or revoked sooner.  Performed at [facility], [place] [address] [place], [place] [address]   Resp Panel by RT-PCR (Flu A&B,  Covid) Nasopharyngeal Swab     Status: None   Collection Time: 07/07/20  9:25 PM   Specimen: Nasopharyngeal Swab; Nasopharyngeal(NP) swabs in vial transport medium  Result Value Ref Range Status   SARS Coronavirus 2 by RT PCR NEGATIVE NEGATIVE Final    Comment: (NOTE) SARS-CoV-2 target nucleic acids are NOT DETECTED.  The SARS-CoV-2 RNA is generally detectable in upper respiratory specimens during the acute phase of infection. The lowest concentration of SARS-CoV-2 viral copies this assay can detect is 138 copies/mL. A negative result does not preclude SARS-Cov-2 infection and should not be used as the sole basis for treatment or other patient management decisions. A negative result may occur with  improper specimen collection/handling, submission of specimen other than nasopharyngeal swab, presence of viral mutation(s) within the areas targeted by this assay, and inadequate number of viral copies(<138 copies/mL). A negative result must be combined with clinical observations, patient history, and epidemiological information. The expected result is Negative.  Fact Sheet for Patients:  EntrepreneurPulse.com.au  Fact Sheet for Healthcare Providers:  IncredibleEmployment.be  This test is no t yet approved or cleared by the [place] FDA and  has been authorized for detection and/or diagnosis of SARS-CoV-2 by FDA under an Emergency Use Authorization (EUA). This EUA will remain  in effect (meaning this test can be used) for the duration of the COVID-19 declaration under Section 564(b)(1) of the Act, 21 U.S.C.section 360bbb-3(b)(1), unless the authorization is terminated  or revoked sooner.       Influenza A by PCR NEGATIVE NEGATIVE Final   Influenza B by PCR NEGATIVE NEGATIVE Final    Comment: (NOTE) The Xpert Xpress SARS-CoV-2/FLU/RSV plus assay is intended as an aid in the diagnosis of influenza from Nasopharyngeal swab specimens and should  not be used as a sole basis for treatment. Nasal washings and aspirates are unacceptable for Xpert Xpress SARS-CoV-2/FLU/RSV testing.  Fact Sheet for Patients: EntrepreneurPulse.com.au  Fact Sheet for Healthcare Providers: IncredibleEmployment.be  This test is not yet approved or cleared by the [place] FDA and has been authorized for detection and/or diagnosis of SARS-CoV-2 by FDA under an Emergency Use Authorization (EUA). This EUA will remain in effect (meaning this test can be used) for the duration of the COVID-19 declaration under Section 564(b)(1) of the Act, 21 U.S.C. section 360bbb-3(b)(1), unless the authorization is terminated or revoked.  Performed at [facility], [place] [address], [place], [place] [address]          Radiology Studies: DG Chest 1 View  Result Date: 07/10/2020 CLINICAL DATA:  Post right-sided thoracentesis EXAM: CHEST  1 VIEW COMPARISON:  07/09/2020; 07/07/2020; chest CT-07/07/2020; 05/15/2020 FINDINGS: Interval reduction in persistent small likely partially loculated right-sided effusion post thoracentesis. No pneumothorax. Improved aeration of the right lung with persistent right basilar consolidative opacities and upper lobe consolidative opacities. Unchanged cardiac silhouette and mediastinal contours with consolidative opacities about the right hilum. Slight worsening of left basilar heterogeneous opacities favored to represent atelectasis. No left-sided pleural effusion or pneumothorax. No definite evidence of edema. No acute osseous abnormalities. Old/healed left-sided posterolateral rib fractures IMPRESSION: 1. Interval reduction in persistent small likely partially loculated right-sided effusion post thoracentesis. No pneumothorax. 2. Improved aeration of the right lung with otherwise similar findings known advanced pulmonary malignancy. Electronically Signed   By: Sandi Mariscal M.D.   On:  07/10/2020 11:11   DG CHEST PORT 1 VIEW  Result Date: 07/09/2020 CLINICAL DATA:  Pleural effusion shortness of breath. EXAM: PORTABLE CHEST 1 VIEW COMPARISON:  07/07/2020 FINDINGS: Lungs are adequately inflated demonstrate no change in patient's known masslike density over the right suprahilar region. There is interval worsening of a moderate size right pleural effusion. Likely associated atelectasis in the right base. Left lung is clear. Cardiomediastinal silhouette and remainder of the exam is unchanged. IMPRESSION: 1. Interval worsening moderate size right pleural effusion likely with associated atelectasis right base. 2. Stable masslike density over the right suprahilar region. Electronically Signed   By: Marin Olp M.D.   On: 07/09/2020 14:27   US THORACENTESIS ASP PLEURAL SPACE W/IMG GUIDE  Result Date: 07/10/2020 INDICATION: Patient with history of small cell lung cancer, dyspnea, recurrent right pleural effusion. Request received for therapeutic right thoracentesis. EXAM: ULTRASOUND GUIDED THERAPEUTIC RIGHT THORACENTESIS MEDICATIONS: 1% lidocaine to skin and subcutaneous tissue COMPLICATIONS: None immediate. PROCEDURE: An ultrasound guided thoracentesis was thoroughly discussed with the patient and questions answered. The benefits, risks, alternatives and complications were also discussed. The patient understands  and wishes to proceed with the procedure. Written consent was obtained. Ultrasound was performed to localize and mark an adequate pocket of fluid in the right chest. The area was then prepped and draped in the normal sterile fashion. 1% Lidocaine was used for local anesthesia. Under ultrasound guidance a 6 Fr Safe-T-Centesis catheter was introduced. Thoracentesis was performed. The catheter was removed and a dressing applied. FINDINGS: A total of approximately 1.1 liters of amber fluid was removed. Due to patient coughing only the above amount of fluid was removed today. IMPRESSION:  Successful ultrasound guided therapeutic right thoracentesis yielding 1.1 liters of pleural fluid. Read by: Rowe Robert, PA-C Electronically Signed   By: Sandi Mariscal M.D.   On: 07/10/2020 10:55        Scheduled Meds: . amLODipine  10 mg Oral Daily  . escitalopram  10 mg Oral Daily  . feeding supplement  237 mL Oral TID BM  . heparin injection (subcutaneous)  5,000 Units Subcutaneous Q8H  . lidocaine      . losartan  50 mg Oral QHS  . magnesium oxide  200 mg Oral QODAY  . metoprolol tartrate  12.5 mg Oral BID  . multivitamin with minerals  1 tablet Oral Daily  . omega-3 acid ethyl esters  2 g Oral Daily  . pravastatin  20 mg Oral q1800   Continuous Infusions:   LOS: 3 days       Hosie Poisson, MD [facility]   To contact the attending provider between 7A-7P or the covering provider during after hours 7P-7A, please log into the web site [email or web address] and access using universal  password for that web site. If you do not have the password, please call the hospital operator.  07/10/2020, 1:36 PM

## 2020-07-10 NOTE — Procedures (Signed)
Ultrasound-guided  therapeutic right thoracentesis performed yielding 1.1 liters of amber fluid. No immediate complications. Follow-up chest x-ray pending. Due to pt coughing only the above amount of fluid was removed today. EBL < 1 cc.

## 2020-07-11 ENCOUNTER — Ambulatory Visit
Admit: 2020-07-11 | Discharge: 2020-07-11 | Disposition: A | Discharge status: Home / Self Care | Payer: Medicare Other | Attending: Radiation Oncology | Admitting: Radiation Oncology

## 2020-07-11 ENCOUNTER — Ambulatory Visit: Payer: Medicare Other | Admitting: Oncology

## 2020-07-11 ENCOUNTER — Inpatient Hospital Stay: Payer: Medicare Other | Admitting: Oncology

## 2020-07-11 ENCOUNTER — Inpatient Hospital Stay: Payer: Medicare Other

## 2020-07-11 ENCOUNTER — Other Ambulatory Visit: Payer: Medicare Other

## 2020-07-11 ENCOUNTER — Encounter: Payer: Self-pay | Admitting: Nurse Practitioner

## 2020-07-11 DIAGNOSIS — J90 Pleural effusion, not elsewhere classified: Secondary | ICD-10-CM | POA: Diagnosis not present

## 2020-07-11 LAB — CBC WITH DIFFERENTIAL/PLATELET
Abs Immature Granulocytes: 0.05 10*3/uL (ref 0.00–0.07)
Basophils Absolute: 0.1 10*3/uL (ref 0.0–0.1)
Basophils Relative: 1 %
Eosinophils Absolute: 0.1 10*3/uL (ref 0.0–0.5)
Eosinophils Relative: 1 %
HCT: 37.3 % (ref 36.0–46.0)
Hemoglobin: 12.3 g/dL (ref 12.0–15.0)
Immature Granulocytes: 1 %
Lymphocytes Relative: 8 %
Lymphs Abs: 0.7 10*3/uL (ref 0.7–4.0)
MCH: 28.9 pg (ref 26.0–34.0)
MCHC: 33 g/dL (ref 30.0–36.0)
MCV: 87.8 fL (ref 80.0–100.0)
Monocytes Absolute: 0.8 10*3/uL (ref 0.1–1.0)
Monocytes Relative: 9 %
Neutro Abs: 6.6 10*3/uL (ref 1.7–7.7)
Neutrophils Relative %: 80 %
Platelets: 395 10*3/uL (ref 150–400)
RBC: 4.25 MIL/uL (ref 3.87–5.11)
RDW: 14.9 % (ref 11.5–15.5)
WBC: 8.2 10*3/uL (ref 4.0–10.5)
nRBC: 0 % (ref 0.0–0.2)

## 2020-07-11 LAB — BASIC METABOLIC PANEL
Anion gap: 11 (ref 5–15)
BUN: 15 mg/dL (ref 8–23)
CO2: 29 mmol/L (ref 22–32)
Calcium: 9.2 mg/dL (ref 8.9–10.3)
Chloride: 90 mmol/L — ABNORMAL LOW (ref 98–111)
Creatinine, Ser: 0.72 mg/dL (ref 0.44–1.00)
GFR, Estimated: >60 mL/min (ref >60)
Glucose, Bld: 122 mg/dL — ABNORMAL HIGH (ref 70–99)
Potassium: 4.2 mmol/L (ref 3.5–5.1)
Sodium: 130 mmol/L — ABNORMAL LOW (ref 135–145)

## 2020-07-11 MED ORDER — TRAMADOL HCL 50 MG PO TABS
50.0000 mg | ORAL_TABLET | Freq: Three times a day (TID) | ORAL | Status: DC | PRN
  Administered 2020-07-11: 50 mg via ORAL
  Filled 2020-07-11: qty 1

## 2020-07-11 NOTE — Progress Notes (Signed)
PROGRESS NOTE    JENNIFR Merritt  WIO:[id number] DOB: 05/09/1928 DOA: 07/07/2020 PCP: Katherina Mires, MD    Chief Complaint  Patient presents with  . Shortness of Breath    Brief Narrative:  Mary Merritt is a 87 y.o. female with medical history significant of stage IV lung cancer with recurrent malignant pleural effusion,, AAA , Hypertension, Hyperlipidemia was seen yesterday in oncology clinic and referred to ED for admission for worsening sob. CT chest showed Large tumor in the right lung with surrounding pleural fluid.Tumor surrounds the right mainstem bronchus and severely narrows the right upper lobe bronchus. The right pulmonary artery is also encased. Multiple nodules significant for metastatic disease . Oncology consulted, recommended radiation oncology referral for radiation to the right lung tumour.  She underwent ultrasound-guided therapeutic right-sided thoracocentesis and 1.1 L of amber fluid removed.  Radiation oncology consulted and who recommended 3 weeks of radiotherapy.  Patient seen and examined at bedside currently requiring up to 2 L of nasal cannula oxygen to keep sats greater than 90%.   Assessment & Plan:   Principal Problem:   Pleural effusion Active Problems:   Essential hypertension   Hyponatremia   Abnormal chest x-ray   Small cell lung cancer (HCC)   Acute respiratory failure with hypoxia requiring up to 2 L of nasal cannula oxygen, secondary to recurrent malignant pleural effusion and worsening of metastatic lung cancer.  - currently requiring between 2 lit of [place] oxygen to keep sats greater than 90%, remains tachypneic on talking or ambulation. - oncology consulted and plan for palliative radiation therapy. Ultrasound thoracentesis ordered and 1.1 L amber-colored fluid removed. -PT evaluation ordered    Essential hypertension: Well-controlled blood pressure parameters   Hyponatremia secondary to SIADH.  Asymptomatic.  Sodium has improved to  130    Generalized deconditioning.  PT evaluation ordered   Hyperlipidemia: Resume pravastatin.     DVT prophylaxis: (Heparin. Code Status: (Full code.  Family Communication: None at bedside Disposition:   Status is: Inpatient  Remains inpatient appropriate because:Ongoing diagnostic testing needed not appropriate for outpatient work up and Inpatient level of care appropriate due to severity of illness   Dispo: The patient is from: Home              Anticipated d/c is to: pending.               Anticipated d/c date is: 2 days              Patient currently is not medically stable to d/c.       Consultants:   Oncology  Radiation oncology.  Procedures: none.   Antimicrobials:none.   Subjective: No new complaints at this time Objective: Vitals:   07/10/20 1042 07/10/20 1123 07/11/20 0523 07/11/20 1257  BP: (!) 106/50 (!) 108/51 116/62 (!) 117/53  Pulse:  63 77 68  Resp:  18 20 19   Temp:  97.8 F (36.6 C) 98 F (36.7 C) (!) 97.5 F (36.4 C)  TempSrc:  Oral Oral Oral  SpO2:  97% 97% 96%  Weight:      Height:        Intake/Output Summary (Last 24 hours) at 07/11/2020 1320 Last data filed at 07/11/2020 3845 Gross per 24 hour  Intake 480 ml  Output --  Net 480 ml   Filed Weights   07/08/20 1809  Weight: 65.3 kg    Examination:  General exam: Elderly lady, currently requiring 2 L of nasal cannula  oxygen, not in any kind of distress. Respiratory system: Diminished air entry at bases, no wheezing or rhonchi, on 2 L of nasal cannula oxygen. Cardiovascular system: S1-S2 heard, regular rate rhythm, no JVD, no pedal edema Gastrointestinal system: Abdomen is soft, nontender, nondistended,, bowel sounds normal Central nervous system: Alert, oriented to place and person, able to move all extremities. Extremities: No cyanosis Skin: No rashes seen Psychiatry: Mood is appropriate    Data Reviewed: I have personally reviewed following labs and imaging  studies  CBC: Recent Labs  Lab 07/07/20 1812 07/08/20 0423 07/11/20 0717  WBC 9.5 9.5 8.2  NEUTROABS 7.5  --  6.6  HGB 12.7 12.0 12.3  HCT 37.7 36.2 37.3  MCV 86.7 86.0 87.8  PLT 466* 410* 154    Basic Metabolic Panel: Recent Labs  Lab 07/07/20 1812 07/08/20 0423 07/09/20 1102 07/11/20 0717  NA 127* 128* 129* 130*  K 4.1 3.9 3.8 4.2  CL 90* 93* 91* 90*  CO2 23 24 31 29   GLUCOSE 128* 113* 210* 122*  BUN 18 13 11 15   CREATININE 0.69 0.58 0.76 0.72  CALCIUM 9.2 8.9 8.8* 9.2    GFR: Estimated Creatinine Clearance: 42.1 mL/min (by C-G formula based on SCr of 0.72 mg/dL).  Liver Function Tests: Recent Labs  Lab 07/07/20 1812 07/08/20 0423  AST 17 16  ALT 14 13  ALKPHOS 65 63  BILITOT 0.5 0.5  PROT 6.4* 6.0*  ALBUMIN 2.9* 2.7*    CBG: No results for input(s): GLUCAP in the last 168 hours.   Recent Results (from the past 240 hour(s))  SARS CORONAVIRUS 2 (TAT 6-24 HRS) Nasopharyngeal Nasopharyngeal Swab     Status: None   Collection Time: 07/01/20  2:52 PM   Specimen: Nasopharyngeal Swab  Result Value Ref Range Status   SARS Coronavirus 2 NEGATIVE NEGATIVE Final    Comment: (NOTE) SARS-CoV-2 target nucleic acids are NOT DETECTED.  The SARS-CoV-2 RNA is generally detectable in upper and lower respiratory specimens during the acute phase of infection. Negative results do not preclude SARS-CoV-2 infection, do not rule out co-infections with other pathogens, and should not be used as the sole basis for treatment or other patient management decisions. Negative results must be combined with clinical observations, patient history, and epidemiological information. The expected result is Negative.  Fact Sheet for Patients: SugarRoll.be  Fact Sheet for Healthcare Providers: https://www.woods-mathews.com/  This test is not yet approved or cleared by the [place] FDA and  has been authorized for detection and/or  diagnosis of SARS-CoV-2 by FDA under an Emergency Use Authorization (EUA). This EUA will remain  in effect (meaning this test can be used) for the duration of the COVID-19 declaration under Se ction 564(b)(1) of the Act, 21 U.S.C. section 360bbb-3(b)(1), unless the authorization is terminated or revoked sooner.  Performed at [facility], [place] [address], [place], [place] [address]   Resp Panel by RT-PCR (Flu A&B, Covid) Nasopharyngeal Swab     Status: None   Collection Time: 07/07/20  9:25 PM   Specimen: Nasopharyngeal Swab; Nasopharyngeal(NP) swabs in vial transport medium  Result Value Ref Range Status   SARS Coronavirus 2 by RT PCR NEGATIVE NEGATIVE Final    Comment: (NOTE) SARS-CoV-2 target nucleic acids are NOT DETECTED.  The SARS-CoV-2 RNA is generally detectable in upper respiratory specimens during the acute phase of infection. The lowest concentration of SARS-CoV-2 viral copies this assay can detect is 138 copies/mL. A negative result does not preclude SARS-Cov-2  infection and should not be used as the sole basis for treatment or other patient management decisions. A negative result may occur with  improper specimen collection/handling, submission of specimen other than nasopharyngeal swab, presence of viral mutation(s) within the areas targeted by this assay, and inadequate number of viral copies(<138 copies/mL). A negative result must be combined with clinical observations, patient history, and epidemiological information. The expected result is Negative.  Fact Sheet for Patients:  EntrepreneurPulse.com.au  Fact Sheet for Healthcare Providers:  IncredibleEmployment.be  This test is no t yet approved or cleared by the [place] FDA and  has been authorized for detection and/or diagnosis of SARS-CoV-2 by FDA under an Emergency Use Authorization (EUA). This EUA will remain  in effect (meaning this test can be used) for the  duration of the COVID-19 declaration under Section 564(b)(1) of the Act, 21 U.S.C.section 360bbb-3(b)(1), unless the authorization is terminated  or revoked sooner.       Influenza A by PCR NEGATIVE NEGATIVE Final   Influenza B by PCR NEGATIVE NEGATIVE Final    Comment: (NOTE) The Xpert Xpress SARS-CoV-2/FLU/RSV plus assay is intended as an aid in the diagnosis of influenza from Nasopharyngeal swab specimens and should not be used as a sole basis for treatment. Nasal washings and aspirates are unacceptable for Xpert Xpress SARS-CoV-2/FLU/RSV testing.  Fact Sheet for Patients: EntrepreneurPulse.com.au  Fact Sheet for Healthcare Providers: IncredibleEmployment.be  This test is not yet approved or cleared by the [place] FDA and has been authorized for detection and/or diagnosis of SARS-CoV-2 by FDA under an Emergency Use Authorization (EUA). This EUA will remain in effect (meaning this test can be used) for the duration of the COVID-19 declaration under Section 564(b)(1) of the Act, 21 U.S.C. section 360bbb-3(b)(1), unless the authorization is terminated or revoked.  Performed at [facility], [place] [address], [place], [place] [address]          Radiology Studies: DG Chest 1 View  Result Date: 07/10/2020 CLINICAL DATA:  Post right-sided thoracentesis EXAM: CHEST  1 VIEW COMPARISON:  07/09/2020; 07/07/2020; chest CT-07/07/2020; 05/15/2020 FINDINGS: Interval reduction in persistent small likely partially loculated right-sided effusion post thoracentesis. No pneumothorax. Improved aeration of the right lung with persistent right basilar consolidative opacities and upper lobe consolidative opacities. Unchanged cardiac silhouette and mediastinal contours with consolidative opacities about the right hilum. Slight worsening of left basilar heterogeneous opacities favored to represent atelectasis. No left-sided pleural effusion  or pneumothorax. No definite evidence of edema. No acute osseous abnormalities. Old/healed left-sided posterolateral rib fractures IMPRESSION: 1. Interval reduction in persistent small likely partially loculated right-sided effusion post thoracentesis. No pneumothorax. 2. Improved aeration of the right lung with otherwise similar findings known advanced pulmonary malignancy. Electronically Signed   By: Sandi Mariscal M.D.   On: 07/10/2020 11:11   DG CHEST PORT 1 VIEW  Result Date: 07/09/2020 CLINICAL DATA:  Pleural effusion shortness of breath. EXAM: PORTABLE CHEST 1 VIEW COMPARISON:  07/07/2020 FINDINGS: Lungs are adequately inflated demonstrate no change in patient's known masslike density over the right suprahilar region. There is interval worsening of a moderate size right pleural effusion. Likely associated atelectasis in the right base. Left lung is clear. Cardiomediastinal silhouette and remainder of the exam is unchanged. IMPRESSION: 1. Interval worsening moderate size right pleural effusion likely with associated atelectasis right base. 2. Stable masslike density over the right suprahilar region. Electronically Signed   By: Marin Olp M.D.   On: 07/09/2020 14:27   US THORACENTESIS  ASP PLEURAL SPACE W/IMG GUIDE  Result Date: 07/10/2020 INDICATION: Patient with history of small cell lung cancer, dyspnea, recurrent right pleural effusion. Request received for therapeutic right thoracentesis. EXAM: ULTRASOUND GUIDED THERAPEUTIC RIGHT THORACENTESIS MEDICATIONS: 1% lidocaine to skin and subcutaneous tissue COMPLICATIONS: None immediate. PROCEDURE: An ultrasound guided thoracentesis was thoroughly discussed with the patient and questions answered. The benefits, risks, alternatives and complications were also discussed. The patient understands and wishes to proceed with the procedure. Written consent was obtained. Ultrasound was performed to localize and mark an adequate pocket of fluid in the right chest.  The area was then prepped and draped in the normal sterile fashion. 1% Lidocaine was used for local anesthesia. Under ultrasound guidance a 6 Fr Safe-T-Centesis catheter was introduced. Thoracentesis was performed. The catheter was removed and a dressing applied. FINDINGS: A total of approximately 1.1 liters of amber fluid was removed. Due to patient coughing only the above amount of fluid was removed today. IMPRESSION: Successful ultrasound guided therapeutic right thoracentesis yielding 1.1 liters of pleural fluid. Read by: Rowe Robert, PA-C Electronically Signed   By: Sandi Mariscal M.D.   On: 07/10/2020 10:55        Scheduled Meds: . amLODipine  10 mg Oral Daily  . escitalopram  10 mg Oral Daily  . feeding supplement  237 mL Oral TID BM  . heparin injection (subcutaneous)  5,000 Units Subcutaneous Q8H  . losartan  50 mg Oral QHS  . magnesium oxide  200 mg Oral QODAY  . metoprolol tartrate  12.5 mg Oral BID  . multivitamin with minerals  1 tablet Oral Daily  . omega-3 acid ethyl esters  2 g Oral Daily  . polyethylene glycol  17 g Oral Daily  . pravastatin  20 mg Oral q1800   Continuous Infusions:   LOS: 4 days       Hosie Poisson, MD [facility]   To contact the attending provider between 7A-7P or the covering provider during after hours 7P-7A, please log into the web site [email or web address] and access using universal Marie password for that web site. If you do not have the password, please call the hospital operator.  07/11/2020, 1:20 PM

## 2020-07-11 NOTE — Progress Notes (Addendum)
HEMATOLOGY-ONCOLOGY PROGRESS NOTE  SUBJECTIVE: Mary Merritt is well-known to our practice.  We treated her for small cell lung cancer.  She received a dose of chemotherapy 06/14/2020.  She is now admitted with shortness of breath.  She was noted to have a large tumor in the right lung surrounds the right mainstem bronchus and severely narrows the right upper lobe bronchus.  She has been seen by radiation oncology with plans for radiation to begin today.  She has status postthoracentesis on 07/10/2020 with 1.1 L of fluid removed.  She reports improvement in her breathing.  She has no other complaints this morning.  Oncology History  Small cell lung cancer ([place])  06/05/2020 Initial Diagnosis   Small cell lung cancer ([place])   06/14/2020 -  Chemotherapy    Patient is on Treatment Plan: LUNG SCLC CARBOPLATIN + ETOPOSIDE + ATEZOLIZUMAB INDUCTION Q21D / ATEZOLIZUMAB MAINTENANCE Q21D       PHYSICAL EXAMINATION:  Vitals:   07/10/20 1123 07/11/20 0523  BP: (!) 108/51 116/62  Pulse: 63 77  Resp: 18 20  Temp: 97.8 F (36.6 C) 98 F (36.7 C)  SpO2: 97% 97%   Filed Weights   07/08/20 1809  Weight: 65.3 kg    Intake/Output from previous day: 01/17 0701 - 01/18 0700 In: 480 [P.O.:480] Out: -   GENERAL:alert, no distress and comfortable SKIN: skin color, texture, turgor are normal, no rashes or significant lesions OROPHARYNX: No thrush or mucositis LUNGS: Diminished right base HEART: regular rate & rhythm and no murmurs and no lower extremity edema NEURO: alert & oriented x 3 with fluent speech, no focal motor/sensory deficits  LABORATORY DATA:  I have reviewed the data as listed CMP Latest Ref Rng & Units 07/11/2020 07/09/2020 07/08/2020  Glucose 70 - 99 mg/dL 122(H) 210(H) 113(H)  BUN 8 - 23 mg/dL 15 11 13   Creatinine 0.44 - 1.00 mg/dL 0.72 0.76 0.58  Sodium 135 - 145 mmol/L 130(L) 129(L) 128(L)  Potassium 3.5 - 5.1 mmol/L 4.2 3.8 3.9  Chloride 98 - 111 mmol/L 90(L) 91(L) 93(L)  CO2 22  - 32 mmol/L 29 31 24   Calcium 8.9 - 10.3 mg/dL 9.2 8.8(L) 8.9  Total Protein 6.5 - 8.1 g/dL - - 6.0(L)  Total Bilirubin 0.3 - 1.2 mg/dL - - 0.5  Alkaline Phos 38 - 126 U/L - - 63  AST 15 - 41 U/L - - 16  ALT 0 - 44 U/L - - 13    Lab Results  Component Value Date   WBC 8.2 07/11/2020   HGB 12.3 07/11/2020   HCT 37.3 07/11/2020   MCV 87.8 07/11/2020   PLT 395 07/11/2020   NEUTROABS 6.6 07/11/2020    DG Chest 1 View  Result Date: 07/10/2020 CLINICAL DATA:  Post right-sided thoracentesis EXAM: CHEST  1 VIEW COMPARISON:  07/09/2020; 07/07/2020; chest CT-07/07/2020; 05/15/2020 FINDINGS: Interval reduction in persistent small likely partially loculated right-sided effusion post thoracentesis. No pneumothorax. Improved aeration of the right lung with persistent right basilar consolidative opacities and upper lobe consolidative opacities. Unchanged cardiac silhouette and mediastinal contours with consolidative opacities about the right hilum. Slight worsening of left basilar heterogeneous opacities favored to represent atelectasis. No left-sided pleural effusion or pneumothorax. No definite evidence of edema. No acute osseous abnormalities. Old/healed left-sided posterolateral rib fractures IMPRESSION: 1. Interval reduction in persistent small likely partially loculated right-sided effusion post thoracentesis. No pneumothorax. 2. Improved aeration of the right lung with otherwise similar findings known advanced pulmonary malignancy. Electronically Signed  By: Sandi Mariscal M.D.   On: 07/10/2020 11:11   DG Chest 1 View  Result Date: 06/21/2020 CLINICAL DATA:  Status post thoracentesis EXAM: CHEST  1 VIEW COMPARISON:  June 14, 2020 chest radiograph and chest CT May 16, 2019 FINDINGS: No pneumothorax. Small residual pleural effusion on the right. There is persistent airspace opacity throughout the right upper lobe. Patchy areas of airspace opacity noted in the left mid lung and left base. Heart  size and pulmonary vascular normal. No adenopathy. There is aortic atherosclerosis. No bone lesions. IMPRESSION: No pneumothorax. Airspace opacity persists in the right upper lobe. The known mass in the right upper lobe is not well delineated given the surrounding apparent pneumonitis. Small residual right pleural effusion with right base atelectasis. New ill-defined airspace opacity left mid lung and left base may represent foci of developing pneumonia. Stable cardiac silhouette. Aortic Atherosclerosis (ICD10-I70.0). Electronically Signed   By: Lowella Grip III M.D.   On: 06/21/2020 10:59   DG Chest 1 View  Result Date: 06/14/2020 CLINICAL DATA:  Pleural effusion. Small cell lung cancer. Status post right thoracentesis. EXAM: CHEST  1 VIEW COMPARISON:  06/07/2020 FINDINGS: The cardiomediastinal silhouette is unchanged. Aortic atherosclerosis is noted. Extensive right upper lobe density corresponding to a known large mass and surrounding airspace opacity is unchanged. A moderate right pleural effusion is stable to minimally smaller than on the prior study. No pneumothorax is identified. No acute osseous abnormality is seen. IMPRESSION: No pneumothorax following thoracentesis. Moderate residual right pleural effusion. Unchanged right upper lobe density corresponding to known mass and surrounding airspace opacity. Electronically Signed   By: Logan Bores M.D.   On: 06/14/2020 10:04   DG Chest 2 View  Result Date: 07/07/2020 CLINICAL DATA:  Increasing shortness of breath for 2 weeks. Known right lung cancer. EXAM: CHEST - 2 VIEW COMPARISON:  Radiographs 07/05/2020 and 07/01/2020.  CT 05/15/2020. FINDINGS: The heart size and mediastinal contours are stable. There is aortic atherosclerosis. Right suprahilar mass, partial right lung collapse and moderate-sized right pleural effusion have not significantly changed. Known peripheral left upper lobe nodule is not well visualized. There is no pneumothorax or  acute osseous abnormality. IMPRESSION: No significant change in right suprahilar mass, partial right lung collapse and moderate right pleural effusion. Electronically Signed   By: Richardean Sale M.D.   On: 07/07/2020 16:26   DG Chest 2 View  Result Date: 07/05/2020 CLINICAL DATA:  87 year old female with pleural effusion. EXAM: CHEST - 2 VIEW COMPARISON:  Chest radiograph dated 07/01/2020. FINDINGS: Slight interval increase in the right pleural effusion since the prior radiograph. There is diffuse right lung opacity. Left lung base atelectasis. No pneumothorax. Stable cardiomediastinal silhouette. No acute osseous pathology. IMPRESSION: Slight interval increase in the right pleural effusion since the prior radiograph. Electronically Signed   By: Anner Crete M.D.   On: 07/05/2020 16:24   DG Chest 2 View  Result Date: 07/01/2020 CLINICAL DATA:  Shortness of breath. History of pleural effusion and thoracentesis. EXAM: CHEST - 2 VIEW COMPARISON:  Chest x-ray dated 06/21/2020. FINDINGS: Reaccumulating RIGHT pleural effusion, moderate in size. Stable chronic airspace opacity within the RIGHT upper lobe, better demonstrated as a mass on chest CT of 05/15/2020. No new findings on the LEFT. Heart size and mediastinal contours appear stable. IMPRESSION: 1. Reaccumulating RIGHT pleural effusion, moderate in size. 2. Stable chronic airspace opacity within the RIGHT upper lobe, better demonstrated as a mass on chest CT of 05/15/2020. Electronically Signed   By:  Franki Cabot M.D.   On: 07/01/2020 15:15   CT Angio Chest PE W and/or Wo Contrast  Result Date: 07/07/2020 CLINICAL DATA:  Chest pain or shortness of breath.  Lung masses EXAM: CT ANGIOGRAPHY CHEST WITH CONTRAST TECHNIQUE: Multidetector CT imaging of the chest was performed using the standard protocol during bolus administration of intravenous contrast. Multiplanar CT image reconstructions and MIPs were obtained to evaluate the vascular anatomy.  CONTRAST:  6mL OMNIPAQUE IOHEXOL 350 MG/ML SOLN COMPARISON:  Chest CT 05/15/2020 FINDINGS: Cardiovascular: Contrast injection is sufficient to demonstrate satisfactory opacification of the pulmonary arteries to the segmental level. There is no pulmonary embolus or evidence of right heart strain. The size of the main pulmonary artery is normal. Heart size is normal, with no pericardial effusion. The course and caliber of the aorta are normal. There is mild atherosclerosis. No acute finding. Mediastinum/Nodes: Unchanged appearance of large prevascular node measuring 2.5 cm. Lungs/Pleura: Large tumor in the right lung with surrounding pleural fluid. There is pleural nodularity along the right minor fissure. Multiple nodular opacities at the right lower lobe. Tumor surrounds the right mainstem bronchus and severely narrows the right upper lobe bronchus. The right pulmonary artery is also encased. Tumor size is increased. Upper Abdomen: Contrast bolus timing is not optimized for evaluation of the abdominal organs. The visualized portions of the organs of the upper abdomen are normal. Musculoskeletal: No chest wall abnormality. No bony spinal canal stenosis. Review of the MIP images confirms the above findings. IMPRESSION: 1. No pulmonary embolus or acute aortic syndrome. 2. 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 Tumor surrounds the right mainstem bronchus and severely narrows the right upper lobe bronchus. The right pulmonary artery is also encased. 3. Multiple nodular opacities at the right lung base, concerning for metastatic disease versus areas of consolidation. 4. Unchanged appearance of large prevascular node. Aortic Atherosclerosis (ICD10-I70.0). Electronically Signed   By: Ulyses Jarred M.D.   On: 07/07/2020 21:46   DG CHEST PORT 1 VIEW  Result Date: 07/09/2020 CLINICAL DATA:  Pleural effusion shortness of breath. EXAM: PORTABLE CHEST 1 VIEW COMPARISON:  07/07/2020 FINDINGS: Lungs are  adequately inflated demonstrate no change in patient's known masslike density over the right suprahilar region. There is interval worsening of a moderate size right pleural effusion. Likely associated atelectasis in the right base. Left lung is clear. Cardiomediastinal silhouette and remainder of the exam is unchanged. IMPRESSION: 1. Interval worsening moderate size right pleural effusion likely with associated atelectasis right base. 2. Stable masslike density over the right suprahilar region. Electronically Signed   By: Marin Olp M.D.   On: 07/09/2020 14:27   DG Chest Port 1 View  Result Date: 07/01/2020 CLINICAL DATA:  Post right-sided thoracentesis. EXAM: PORTABLE CHEST 1 VIEW COMPARISON:  Radiograph 07/01/2020, CT 05/15/2020 FINDINGS: Slight decreased right pleural effusion after thoracentesis. No visualized pneumothorax or pleural line. Opacity in the right lung most prominent in the suprahilar region is unchanged from earlier today. Stable heart size and mediastinal contours. Aortic atherosclerosis. Mild atelectasis at the left lung base. Lingular nodule on CT is not well seen by radiograph. Bones are diffusely under mineralized. Remote left rib fractures. IMPRESSION: 1. Slight decreased right pleural effusion after thoracentesis. No visualized pneumothorax. 2. Unchanged right upper lung zone opacity, right upper lobe mass on prior CT. Electronically Signed   By: Keith Rake M.D.   On: 07/01/2020 19:39   DG ABD ACUTE 2+V W 1V CHEST  Result Date: 06/16/2020 CLINICAL  DATA:  Constipation.  History of lung cancer. EXAM: DG ABDOMEN ACUTE WITH 1 VIEW CHEST COMPARISON:  Chest x-ray dated 06/14/2020 FINDINGS: Diffuse airspace opacification is again noted throughout the right lung field. There is a right-sided pleural effusion. There is right-sided volume loss. Aortic calcifications are noted. The heart size is stable. There is no pneumothorax. There is a large amount of stool throughout the colon.  The bowel gas pattern is nonobstructive. There are degenerative changes throughout the lumbar spine. The patient is status post prior total hip arthroplasty bilaterally. IMPRESSION: 1. Nonobstructive bowel gas pattern. 2. Large amount of stool throughout the colon. 3. Stable appearance of the chest. Electronically Signed   By: Constance Holster M.D.   On: 06/16/2020 18:35   IR THORACENTESIS ASP PLEURAL SPACE W/IMG GUIDE  Result Date: 06/14/2020 INDICATION: Patient with recurrent right malignant effusion. Request is made for therapeutic right thoracentesis. EXAM: ULTRASOUND GUIDED THERAPEUTIC RIGHT THORACENTESIS MEDICATIONS: 10 mL 1% lidocaine COMPLICATIONS: None immediate. PROCEDURE: An ultrasound guided thoracentesis was thoroughly discussed with the patient and questions answered. The benefits, risks, alternatives and complications were also discussed. The patient understands and wishes to proceed with the procedure. Written consent was obtained. Ultrasound was performed to localize and mark an adequate pocket of fluid in the right chest. The area was then prepped and draped in the normal sterile fashion. 1% Lidocaine was used for local anesthesia. Under ultrasound guidance a 6 Fr Safe-T-Centesis catheter was introduced. Thoracentesis was performed. The catheter was removed and a dressing applied. FINDINGS: A total of approximately 1.4 liters of dark, yellow fluid was removed. Samples were sent to the laboratory as requested by the clinical team. IMPRESSION: Successful ultrasound guided therapeutic right thoracentesis yielding 1.4 liters of pleural fluid. Read by: Brynda Greathouse PA-C Electronically Signed   By: Sandi Mariscal M.D.   On: 06/14/2020 12:39   US THORACENTESIS ASP PLEURAL SPACE W/IMG GUIDE  Result Date: 07/10/2020 INDICATION: Patient with history of small cell lung cancer, dyspnea, recurrent right pleural effusion. Request received for therapeutic right thoracentesis. EXAM: ULTRASOUND GUIDED  THERAPEUTIC RIGHT THORACENTESIS MEDICATIONS: 1% lidocaine to skin and subcutaneous tissue COMPLICATIONS: None immediate. PROCEDURE: An ultrasound guided thoracentesis was thoroughly discussed with the patient and questions answered. The benefits, risks, alternatives and complications were also discussed. The patient understands and wishes to proceed with the procedure. Written consent was obtained. Ultrasound was performed to localize and mark an adequate pocket of fluid in the right chest. The area was then prepped and draped in the normal sterile fashion. 1% Lidocaine was used for local anesthesia. Under ultrasound guidance a 6 Fr Safe-T-Centesis catheter was introduced. Thoracentesis was performed. The catheter was removed and a dressing applied. FINDINGS: A total of approximately 1.1 liters of amber fluid was removed. Due to patient coughing only the above amount of fluid was removed today. IMPRESSION: Successful ultrasound guided therapeutic right thoracentesis yielding 1.1 liters of pleural fluid. Read by: Rowe Robert, PA-C Electronically Signed   By: Sandi Mariscal M.D.   On: 07/10/2020 10:55   US Thoracentesis Asp Pleural space w/IMG guide  Result Date: 06/21/2020 INDICATION: Patient with a history of lung cancer and recurrent right pleural effusion. Interventional radiology asked to perform a therapeutic thoracentesis. EXAM: ULTRASOUND GUIDED THORACENTESIS MEDICATIONS: 1% lidocaine 10 mL COMPLICATIONS: None immediate. PROCEDURE: An ultrasound guided thoracentesis was thoroughly discussed with the patient and questions answered. The benefits, risks, alternatives and complications were also discussed. The patient understands and wishes to proceed with the procedure. Written consent  was obtained. Ultrasound was performed to localize and mark an adequate pocket of fluid in the right chest. The area was then prepped and draped in the normal sterile fashion. 1% Lidocaine was used for local anesthesia. Under  ultrasound guidance a 6 Fr Safe-T-Centesis catheter was introduced. Thoracentesis was performed. The catheter was removed and a dressing applied. FINDINGS: A total of approximately 1 L of amber-colored fluid was removed. IMPRESSION: Successful ultrasound guided right thoracentesis yielding 1 L of pleural fluid. Read by: Soyla Dryer, NP Electronically Signed   By: Jerilynn Mages.  Shick M.D.   On: 06/21/2020 11:13    ASSESSMENT AND PLAN: 1. Right lung mass concerning for malignancy -05/15/2020-CT chest with contrast showed a 7.2 x 5.9 cm right upper lobe lung mass with invasion into the right hilum and mediastinum, extensive mediastinal and right hilar lymphadenopathy, extensive interstitial spread of tumor in the right lung and large loculated right pleural fluid collection likely secondary to pleural metastatic disease, scattered pulmonary nodules in the left lung suspicious for metastatic disease, 13 x 10.5 mm spiculated lesion in the lingula which could be a second primary. -Cytology on pleural fluid 11/23/2021positivefor malignant cells;cluster of cells positive forMOC31, TTF-1,synaptophysinand CD 56; possibility of small cell carcinoma raised -Cytology on pleural fluid 05/26/2020-malignant cells present with immunophenotype raising possibility of small cell carcinoma -Biopsy right supraclavicular lymph node 06/09/2020-small cell carcinoma -Cycle 1 modified Carboplatin/Etoposide plus atezolizumab 06/14/2020 2. Pleural effusion-malignant 3.Postobstructive pneumonia? 4. Hyponatremia 5. Mild normocytic anemia 6. Thrombocytosis 7. Hypertension 8. AAA status post repair in 2009 9. PAD 10. Hyperlipidemia 11. Depression  12. 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  Hospital admission 07/07/2020-dyspnea secondary to lung tumor and pleural effusion  Ms. Koren is currently admitted secondary to dyspnea from her right lung mass and pleural effusion.  Breathing has improved following  thoracentesis.  She is scheduled to begin radiation to her right lung mass.  Based on CT scan findings, she did not have a significant response to treatment.  Will need to consider additional chemotherapy options when she completes radiation therapy.  The patient would like to go home.  Discussed with the patient's daughter who was on the telephone this morning who would like to take her home.  They understand that she will likely not be able to go home until tomorrow as her radiation is not scheduled until late today.  Recommended PT consult to evaluate her gait and for consideration of assistive devices.  Recommendations: 1.  Proceed with radiation under the care of Dr. Lisbeth Renshaw. 2.  PT consult. 3.  We will arrange for outpatient follow-up with the cancer center to discuss treatment options once radiation is complete   LOS: 4 days   Mikey Bussing, DNP, AGPCNP-BC, AOCNP 07/11/20 Mary Merritt was interviewed and examined.  She was admitted 07/07/2020 with increased dyspnea.  She feels better after undergoing a thoracentesis yesterday.  She had a chest CT on 07/07/2020.  This confirmed a large right lung mass with narrowing of the right upper lobe bronchus.  Unchanged appearance of a prevascular node.  I reviewed the CT images and discussed the findings with Mary Merritt and her daughter.  We discussed treatment options.  She does not appear to have experienced a significant response to the first cycle of systemic therapy.  I agree with the plan for palliative radiation to the right chest.  We will see her at the completion of radiation to discuss continuing the etoposide/carboplatin/atezolizumab versus comfort care.  We also discussed the possibility of a second  line systemic therapy regimen.  She appears stable to be discharged to home within the next 1-2 days to continue outpatient radiation.  I was present for greater than 50% of the visit today and performed medical decision making.

## 2020-07-11 NOTE — Care Management Important Message (Signed)
Important Message  Patient Details IM Letter placed in Patients room. Name: Mary Merritt MRN: [id number] Date of Birth: 09/13/1927   Medicare Important Message Given:  Yes     Kerin Salen 07/11/2020, 3:55 PM

## 2020-07-11 NOTE — Plan of Care (Signed)
  Problem: Education: Goal: Knowledge of General Education information will improve Description: Including pain rating scale, medication(s)/side effects and non-pharmacologic comfort measures Outcome: Progressing   Problem: Health Behavior/Discharge Planning: Goal: Ability to manage health-related needs will improve Outcome: Progressing   Problem: Clinical Measurements: Goal: Will remain free from infection Outcome: Progressing   Problem: Pain Managment: Goal: General experience of comfort will improve Outcome: Progressing

## 2020-07-11 NOTE — Evaluation (Signed)
Physical Therapy Evaluation Patient Details Name: Mary Merritt MRN: [id number] DOB: 07/19/1927 Today's Date: 07/11/2020   History of Present Illness  Pt is 87 yo female admitted with worsening DOE.  She was admitted with PNE and lung mass. Pt scheduled for thoracentesis later today.   PMH is significant for HTN, hyperlipidemia, AAA s/p repair, L THA, and R hip OA.  Clinical Impression  Pt admitted with above diagnosis.  Pt is independent at her baseline, sometimes uses RW. dtr and son assist prn with heavier household tasks. Pt fatigues quite easily today, 3/4 DOE, tachypneic,  SpO2=88-94% on RA throughout PT session. Pt feels she "needs O2"  Pt currently with functional limitations due to the deficits listed below (see PT Problem List). Pt will benefit from skilled PT to increase their independence and safety with mobility to allow discharge to the venue listed below.       Follow Up Recommendations Home health PT;Other (comment);Supervision - Intermittent (HHOT)    Equipment Recommendations  Other (comment) (rollator)    Recommendations for Other Services       Precautions / Restrictions Precautions Precautions: Fall Restrictions Weight Bearing Restrictions: No      Mobility  Bed Mobility Overal bed mobility: Needs Assistance Bed Mobility: Supine to Sit     Supine to sit: HOB elevated;Min guard     General bed mobility comments: incr time    Transfers Overall transfer level: Needs assistance Equipment used: Rolling walker (2 wheeled) Transfers: Sit to/from Stand Sit to Stand: Min guard;Min assist         General transfer comment: pt standing before PT ready, attempting to place shoes on in standing without support. min-min/guard for balance and safety on rising  Ambulation/Gait Ambulation/Gait assistance: Min guard Gait Distance (Feet): 40 Feet Assistive device: Rolling walker (2 wheeled) Gait Pattern/deviations: Step-through pattern;Decreased stride  length;Trunk flexed     General Gait Details: cues for RW position, slight unsteadiness, fatigues easily, without overt LOB  Stairs            Wheelchair Mobility    Modified Rankin (Stroke Patients Only)       Balance Overall balance assessment: Needs assistance;History of Falls Sitting-balance support: No upper extremity supported;Feet supported Sitting balance-Leahy Scale: Good       Standing balance-Leahy Scale: Fair                               Pertinent Vitals/Pain Pain Assessment: Faces Faces Pain Scale: Hurts little more Pain Location: R of sternum Pain Descriptors / Indicators: Grimacing Pain Intervention(s): Limited activity within patient's tolerance;Monitored during session;Repositioned    Home Living Family/patient expects to be discharged to:: Private residence Living Arrangements: Alone Available Help at Discharge: Family;Available PRN/intermittently Type of Home: House Home Access: Stairs to enter Entrance Stairs-Rails: Left Entrance Stairs-Number of Steps: 3 Home Layout: One level Home Equipment: Walker - 2 wheels;Cane - single point;Shower seat      Prior Function Level of Independence: Needs assistance   Gait / Transfers Assistance Needed: Ambulates without AD typically ; limited community distance;pt endorses 1 recent  fall outside while getting her mail, had to have her son come assist her to get up  ADL's / Homemaking Assistance Needed: Reports independent with ADLs and light IADLs  Comments: family assists with laundry and other heavier household tasks     Hand Dominance        Extremity/Trunk Assessment   Upper Extremity  Assessment Upper Extremity Assessment: Generalized weakness    Lower Extremity Assessment Lower Extremity Assessment: Generalized weakness       Communication   Communication: No difficulties  Cognition Arousal/Alertness: Awake/alert Behavior During Therapy: WFL for tasks  assessed/performed Overall Cognitive Status: Within Functional Limits for tasks assessed                                        General Comments      Exercises     Assessment/Plan    PT Assessment Patient needs continued PT services  PT Problem List Decreased strength;Decreased activity tolerance;Decreased balance;Decreased knowledge of use of DME;Cardiopulmonary status limiting activity;Decreased mobility;Decreased safety awareness       PT Treatment Interventions DME instruction;Therapeutic activities;Gait training;Functional mobility training;Therapeutic exercise;Patient/family education;Balance training    PT Goals (Current goals can be found in the Care Plan section)  Acute Rehab PT Goals Patient Stated Goal: home and feel better PT Goal Formulation: With patient/family Time For Goal Achievement: 07/25/20 Potential to Achieve Goals: Fair    Frequency Min 3X/week   Barriers to discharge        Co-evaluation               AM-PAC PT "6 Clicks" Mobility  Outcome Measure Help needed turning from your back to your side while in a flat bed without using bedrails?: A Little Help needed moving from lying on your back to sitting on the side of a flat bed without using bedrails?: A Little Help needed moving to and from a bed to a chair (including a wheelchair)?: A Little Help needed standing up from a chair using your arms (e.g., wheelchair or bedside chair)?: A Little Help needed to walk in hospital room?: A Little Help needed climbing 3-5 steps with a railing? : A Little 6 Click Score: 18    End of Session Equipment Utilized During Treatment: Gait belt Activity Tolerance: Patient limited by fatigue Patient left: in chair;with call bell/phone within reach;with chair alarm set;with family/visitor present Nurse Communication: Mobility status PT Visit Diagnosis: Unsteadiness on feet (R26.81);Muscle weakness (generalized) (M62.81)    Time:  9030-0923 PT Time Calculation (min) (ACUTE ONLY): 32 min   Charges:   PT Evaluation $PT Eval Low Complexity: 1 Low PT Treatments $Gait Training: 8-22 mins        Baxter Flattery, PT  Acute Rehab Dept ([place]) [phone] Pager [phone]  07/11/2020   [facility] 07/11/2020, 4:39 PM

## 2020-07-12 ENCOUNTER — Inpatient Hospital Stay: Payer: Medicare Other

## 2020-07-12 ENCOUNTER — Ambulatory Visit
Admit: 2020-07-12 | Discharge: 2020-07-12 | Disposition: A | Discharge status: Home / Self Care | Payer: Medicare Other | Attending: Radiation Oncology | Admitting: Radiation Oncology

## 2020-07-12 DIAGNOSIS — J90 Pleural effusion, not elsewhere classified: Secondary | ICD-10-CM | POA: Diagnosis not present

## 2020-07-12 DIAGNOSIS — R531 Weakness: Secondary | ICD-10-CM

## 2020-07-12 DIAGNOSIS — J9621 Acute and chronic respiratory failure with hypoxia: Secondary | ICD-10-CM

## 2020-07-12 NOTE — Progress Notes (Signed)
PROGRESS NOTE  Mary Merritt NAT:[id number] DOB: [birth year]   PCP: Katherina Mires, MD  Patient is from: Home  DOA: 07/07/2020 LOS: 5  Chief complaints: Shortness of breath  Brief Narrative / Interim history: 87 year old female with PMH of stage IV lung cancer, recurrent malignant pleural effusion, AAA, HTN and HLD who was directed to ED from oncology clinic due to worsening dyspnea.  CT chest showed large tumor in right lung with mass-effect and surrounding pleural fluid.  She underwent US-guided core with removal of 1.1 L pleural fluid.  Radiation oncology recommended 3 weeks of radiotherapy, that has started on 07/12/2020.  Therapy recommended home health and DME on discharge.  Subjective: Seen and examined earlier this morning.  No major events overnight or this morning.  No complaints other than right-sided chest pain that has improved with Tylenol.  She rates her pain 2/10 now.  She is concerned about going home.  She lives alone.  Objective: Vitals:   07/11/20 2041 07/12/20 0439 07/12/20 1023 07/12/20 1206  BP: 139/64 121/68 (!) 105/54 (!) 107/54  Pulse: 74 (!) 57 69 81  Resp: 20 18  20   Temp: 98 F (36.7 C) 98.4 F (36.9 C)  98 F (36.7 C)  TempSrc: Oral Oral  Oral  SpO2: 96% 98%  98%  Weight:      Height:        Intake/Output Summary (Last 24 hours) at 07/12/2020 1339 Last data filed at 07/11/2020 2200 Gross per 24 hour  Intake 120 ml  Output -  Net 120 ml   Filed Weights   07/08/20 1809  Weight: 65.3 kg    Examination:  GENERAL: No apparent distress.  Nontoxic. HEENT: MMM.  Vision and hearing grossly intact.  NECK: Supple.  No apparent JVD.  RESP: 98% on 4 L at rest.  No IWOB.  Fair aeration bilaterally. CVS:  RRR. Heart sounds normal.  ABD/GI/GU: BS+. Abd soft, NTND.  MSK/EXT:  Moves extremities. No apparent deformity. No edema.  SKIN: no apparent skin lesion or wound NEURO: Awake, alert and oriented appropriately.  No apparent focal neuro  deficit. PSYCH: Calm. Normal affect.   Procedures:  1/17-right-sided US-guided thoracocentesis with removal of 1.1 L  Microbiology summarized: Influenza and COVID-19 PCR negative.  Assessment & Plan: Acute respiratory failure with hypoxia due to recurrent malignant pleural effusion and worsening of metastatic lung cancer with mass effect on right bronchus.  Desaturated to 88% requiring 2 L to maintain saturation above 90% with ambulation. -S/p thoracocentesis with removal of 1.1 L pleural effusion on 1/17.  Fluid was not sent for study. -OOB/PT/OT/incentive spirometry -Wean oxygen as able -Radiation oncology planning palliative radiation therapy today -Oncology following.  Essential hypertension: Normotensive. -Continue amlodipine, losartan and metoprolol.  Hyponatremia secondary to SIADH. Na stable at 130. -Continue monitoring  Generalized weakness/physical deconditioning -PT/OT recommended home health and DME but patient concerned as she lives alone  Hyperlipidemia: On pravastatin.  Doubt utility of this as primary prevention.  Cancer related pain: -Continue Tylenol and tramadol.  Body mass index is 23.96 kg/m. Nutrition Problem: Increased nutrient needs Etiology: chronic illness (metastatic lung cancer) Signs/Symptoms: estimated needs Interventions: Ensure Enlive (each supplement provides 350kcal and 20 grams of protein),MVI   DVT prophylaxis:  heparin injection 5,000 Units Start: 07/08/20 1400 SCDs Start: 07/07/20 2209  Code Status: Partial code Family Communication: Patient and/or RN. No family listed in her chart Status is: Inpatient  Remains inpatient appropriate because:Ongoing diagnostic testing needed not appropriate for outpatient work  up and Inpatient level of care appropriate due to severity of illness   Dispo: The patient is from: Home              Anticipated d/c is to: Home              Anticipated d/c date is: 1 day              Patient  currently is not medically stable to d/c.       Consultants:  Oncology Radiation oncology Interventional radiology   Sch Meds:  Scheduled Meds: . amLODipine  10 mg Oral Daily  . escitalopram  10 mg Oral Daily  . feeding supplement  237 mL Oral TID BM  . heparin injection (subcutaneous)  5,000 Units Subcutaneous Q8H  . losartan  50 mg Oral QHS  . magnesium oxide  200 mg Oral QODAY  . metoprolol tartrate  12.5 mg Oral BID  . multivitamin with minerals  1 tablet Oral Daily  . omega-3 acid ethyl esters  2 g Oral Daily  . polyethylene glycol  17 g Oral Daily  . pravastatin  20 mg Oral q1800   Continuous Infusions: PRN Meds:.acetaminophen, ondansetron **OR** ondansetron (ZOFRAN) IV, traMADol  Antimicrobials: Anti-infectives (From admission, onward)   None       I have personally reviewed the following labs and images: CBC: Recent Labs  Lab 07/07/20 1812 07/08/20 0423 07/11/20 0717  WBC 9.5 9.5 8.2  NEUTROABS 7.5  --  6.6  HGB 12.7 12.0 12.3  HCT 37.7 36.2 37.3  MCV 86.7 86.0 87.8  PLT 466* 410* 395   BMP &GFR Recent Labs  Lab 07/07/20 1812 07/08/20 0423 07/09/20 1102 07/11/20 0717  NA 127* 128* 129* 130*  K 4.1 3.9 3.8 4.2  CL 90* 93* 91* 90*  CO2 23 24 31 29   GLUCOSE 128* 113* 210* 122*  BUN 18 13 11 15   CREATININE 0.69 0.58 0.76 0.72  CALCIUM 9.2 8.9 8.8* 9.2   Estimated Creatinine Clearance: 42.1 mL/min (by C-G formula based on SCr of 0.72 mg/dL). Liver & Pancreas: Recent Labs  Lab 07/07/20 1812 07/08/20 0423  AST 17 16  ALT 14 13  ALKPHOS 65 63  BILITOT 0.5 0.5  PROT 6.4* 6.0*  ALBUMIN 2.9* 2.7*   No results for input(s): LIPASE, AMYLASE in the last 168 hours. No results for input(s): AMMONIA in the last 168 hours. Diabetic: No results for input(s): HGBA1C in the last 72 hours. No results for input(s): GLUCAP in the last 168 hours. Cardiac Enzymes: No results for input(s): CKTOTAL, CKMB, CKMBINDEX, TROPONINI in the last 168  hours. No results for input(s): PROBNP in the last 8760 hours. Coagulation Profile: Recent Labs  Lab 07/07/20 1837  INR 1.0   Thyroid Function Tests: No results for input(s): TSH, T4TOTAL, FREET4, T3FREE, THYROIDAB in the last 72 hours. Lipid Profile: No results for input(s): CHOL, HDL, LDLCALC, TRIG, CHOLHDL, LDLDIRECT in the last 72 hours. Anemia Panel: No results for input(s): VITAMINB12, FOLATE, FERRITIN, TIBC, IRON, RETICCTPCT in the last 72 hours. Urine analysis:    Component Value Date/Time   COLORURINE YELLOW 12/23/2018 2140   APPEARANCEUR HAZY (A) 12/23/2018 2140   LABSPEC 1.013 12/23/2018 2140   PHURINE 6.0 12/23/2018 2140   GLUCOSEU NEGATIVE 12/23/2018 2140   HGBUR SMALL (A) 12/23/2018 2140   BILIRUBINUR NEGATIVE 12/23/2018 2140   KETONESUR NEGATIVE 12/23/2018 2140   PROTEINUR NEGATIVE 12/23/2018 2140   UROBILINOGEN 0.2 07/21/2014 1356   NITRITE POSITIVE (  A) 12/23/2018 2140   LEUKOCYTESUR MODERATE (A) 12/23/2018 2140   Sepsis Labs: Invalid input(s): PROCALCITONIN, [place]  Microbiology: Recent Results (from the past 240 hour(s))  Resp Panel by RT-PCR (Flu A&B, Covid) Nasopharyngeal Swab     Status: None   Collection Time: 07/07/20  9:25 PM   Specimen: Nasopharyngeal Swab; Nasopharyngeal(NP) swabs in vial transport medium  Result Value Ref Range Status   SARS Coronavirus 2 by RT PCR NEGATIVE NEGATIVE Final    Comment: (NOTE) SARS-CoV-2 target nucleic acids are NOT DETECTED.  The SARS-CoV-2 RNA is generally detectable in upper respiratory specimens during the acute phase of infection. The lowest concentration of SARS-CoV-2 viral copies this assay can detect is 138 copies/mL. A negative result does not preclude SARS-Cov-2 infection and should not be used as the sole basis for treatment or other patient management decisions. A negative result may occur with  improper specimen collection/handling, submission of specimen other than nasopharyngeal swab,  presence of viral mutation(s) within the areas targeted by this assay, and inadequate number of viral copies(<138 copies/mL). A negative result must be combined with clinical observations, patient history, and epidemiological information. The expected result is Negative.  Fact Sheet for Patients:  EntrepreneurPulse.com.au  Fact Sheet for Healthcare Providers:  IncredibleEmployment.be  This test is no t yet approved or cleared by the [place] FDA and  has been authorized for detection and/or diagnosis of SARS-CoV-2 by FDA under an Emergency Use Authorization (EUA). This EUA will remain  in effect (meaning this test can be used) for the duration of the COVID-19 declaration under Section 564(b)(1) of the Act, 21 U.S.C.section 360bbb-3(b)(1), unless the authorization is terminated  or revoked sooner.       Influenza A by PCR NEGATIVE NEGATIVE Final   Influenza B by PCR NEGATIVE NEGATIVE Final    Comment: (NOTE) The Xpert Xpress SARS-CoV-2/FLU/RSV plus assay is intended as an aid in the diagnosis of influenza from Nasopharyngeal swab specimens and should not be used as a sole basis for treatment. Nasal washings and aspirates are unacceptable for Xpert Xpress SARS-CoV-2/FLU/RSV testing.  Fact Sheet for Patients: EntrepreneurPulse.com.au  Fact Sheet for Healthcare Providers: IncredibleEmployment.be  This test is not yet approved or cleared by the [place] FDA and has been authorized for detection and/or diagnosis of SARS-CoV-2 by FDA under an Emergency Use Authorization (EUA). This EUA will remain in effect (meaning this test can be used) for the duration of the COVID-19 declaration under Section 564(b)(1) of the Act, 21 U.S.C. section 360bbb-3(b)(1), unless the authorization is terminated or revoked.  Performed at [facility], [place] [address], [place], [place] [address]      Radiology Studies: No results found.    Taye T. Tracyton  If 7PM-7AM, please contact night-coverage [email or web address] 07/12/2020, 1:39 PM

## 2020-07-12 NOTE — Progress Notes (Signed)
Resumed care for this pt at 0100. I agree with previous RN's assessment. Pt resting at this time. 

## 2020-07-12 NOTE — TOC Initial Note (Signed)
Transition of Care [facility]) - Initial/Assessment Note    Patient Details  Name: Mary Merritt MRN: [id number] Date of Birth: 12/11/1927  [facility] of [facility]) CM/SW Contact:    Dessa Phi, RN Phone Number: 07/12/2020, 2:26 PM  Clinical Narrative: Patient is being recc currently for HHPT-spoke to patient/dtr about SNF-voiced understanding-notified MD/Nsg will need PT to recc SNF if appropriate.Once recc is for SNF then CM can fax out per patient/dtr request-dtr [name] prefers [facility].                  Expected Discharge Plan: Skilled Nursing Facility Barriers to Discharge: Continued Medical Work up   Patient Goals and CMS Choice Patient states their goals for this hospitalization and ongoing recovery are:: go to rehab CMS Medicare.gov Compare Post Acute Care list provided to:: Patient Choice offered to / list presented to : Patient  Expected Discharge Plan and Services Expected Discharge Plan: [place]   Discharge Planning Services: CM Consult   Living arrangements for the past 2 months: Single Family Home                                      Prior Living Arrangements/Services Living arrangements for the past 2 months: Single Family Home Lives with:: Self Patient language and need for interpreter reviewed:: Yes Do you feel safe going back to the place where you live?: Yes      Need for Family Participation in Patient Care: No (Comment) Care giver support system in place?: Yes (comment)   Criminal Activity/Legal Involvement Pertinent to Current Situation/Hospitalization: No - Comment as needed  Activities of Daily Living Home Assistive Devices/Equipment: Dentures (specify type),Eyeglasses,Walker (specify type) ADL Screening (condition at time of admission) Patient's cognitive ability adequate to safely complete daily activities?: Yes Is the patient deaf or have difficulty hearing?: Yes (some hearing issues noted) Does the patient  have difficulty seeing, even when wearing glasses/contacts?: No Does the patient have difficulty concentrating, remembering, or making decisions?: No Patient able to express need for assistance with ADLs?: Yes Does the patient have difficulty dressing or bathing?: No Independently performs ADLs?: Yes (appropriate for developmental age) Does the patient have difficulty walking or climbing stairs?: Yes Weakness of Legs: Both Weakness of Arms/Hands: None  Permission Sought/Granted Permission sought to share information with : Case Manager Permission granted to share information with : Yes, Verbal Permission Granted  Share Information with NAME: Case manager     Permission granted to share info w RelationshipBreck Coons dtr [phone]     Emotional Assessment Appearance:: Appears stated age Attitude/Demeanor/Rapport: Gracious Affect (typically observed): Accepting Orientation: : Oriented to Self,Oriented to Place,Oriented to  Time,Oriented to Situation Alcohol / Substance Use: Not Applicable Psych Involvement: No (comment)  Admission diagnosis:  Pleural effusion [J90] Dyspnea, unspecified type [R06.00] Patient Active Problem List   Diagnosis Date Noted  . Pleural effusion 07/07/2020  . Small cell lung cancer ([place]) 06/05/2020  . Goals of care, counseling/discussion 06/05/2020  . Abnormal chest x-ray   . Pneumonia 05/14/2020  . Pain in right foot 11/17/2019  . Hyponatremia 11/22/2017  . Nondisplaced fracture of left tibial spine, subsequent encounter for closed fracture with routine healing 08/05/2017  . Chronic pain of left knee 07/21/2017  . Presence of left artificial hip joint 05/30/2016  . Hip joint replacement status 03/27/2016  . S/P hip replacement 07/27/2015  . Osteoarthritis of  right hip 08/01/2014  . Essential hypertension 05/31/2014  . Mixed hyperlipidemia 05/31/2014  . S/P AAA repair 05/31/2014  . Bradycardia 05/31/2014  . Murmur 05/31/2014   PCP:  Katherina Mires, MD Pharmacy:   [facility] [address] [place]), [place] - [address] [place] [address] Phone: [phone] Fax: [phone]     Social Determinants of Health (SDOH) Interventions    Readmission Risk Interventions No flowsheet data found.

## 2020-07-12 NOTE — Progress Notes (Signed)
Physical Therapy Treatment Patient Details Name: Mary Merritt MRN: [id number] DOB: 1928/01/08 Today's Date: 07/12/2020    History of Present Illness Pt is 87 yo female admitted with worsening DOE.  She was admitted with PNE and lung mass. Pt scheduled for thoracentesis later today.   PMH is significant for HTN, hyperlipidemia, AAA s/p repair, L THA, and R hip OA.    PT Comments    Pt feeling weaker overall today. amb in room distance with 3/4 DOE although no chest pain. Given pt's limited activity tolerance and higher level balance issues, as well the fact that she lives alone-- recommend SNF post acute. Will continue to follow in acute setting.     Follow Up Recommendations  SNF     Equipment Recommendations  Other (comment) (rollator)    Recommendations for Other Services       Precautions / Restrictions Precautions Precautions: Fall Restrictions Weight Bearing Restrictions: No    Mobility  Bed Mobility Overal bed mobility: Needs Assistance Bed Mobility: Supine to Sit;Sit to Supine     Supine to sit: HOB elevated;Min guard Sit to supine: Min guard   General bed mobility comments: incr time, min/guard for lines and safety  Transfers Overall transfer level: Needs assistance Equipment used: Rolling walker (2 wheeled) Transfers: Sit to/from Stand Sit to Stand: Min assist         General transfer comment: light min assist from toilet and bed. incr time, reliance on UEs  Ambulation/Gait Ambulation/Gait assistance: Min guard;Min assist Gait Distance (Feet): 30 Feet (15') Assistive device: Rolling walker (2 wheeled) Gait Pattern/deviations: Step-through pattern;Decreased stride length;Trunk flexed     General Gait Details: cues for RW position, slight unsteadiness, fatigues easily, without overt LOB   Stairs             Wheelchair Mobility    Modified Rankin (Stroke Patients Only)       Balance   Sitting-balance support: No upper extremity  supported;Feet supported Sitting balance-Leahy Scale: Good       Standing balance-Leahy Scale: Fair (reliant on UEs for dynamic)                              Cognition Arousal/Alertness: Awake/alert Behavior During Therapy: WFL for tasks assessed/performed Overall Cognitive Status: Within Functional Limits for tasks assessed                                        Exercises      General Comments        Pertinent Vitals/Pain Pain Assessment: No/denies pain Pain Location: "foot numb"    Home Living                      Prior Function            PT Goals (current goals can now be found in the care plan section) Acute Rehab PT Goals Patient Stated Goal: home and feel better PT Goal Formulation: With patient/family Time For Goal Achievement: 07/25/20 Potential to Achieve Goals: Fair Progress towards PT goals: Progressing toward goals    Frequency    Min 3X/week      PT Plan Discharge plan needs to be updated    Co-evaluation              AM-PAC PT "6 Clicks" Mobility  Outcome Measure  Help needed turning from your back to your side while in a flat bed without using bedrails?: A Little Help needed moving from lying on your back to sitting on the side of a flat bed without using bedrails?: None Help needed moving to and from a bed to a chair (including a wheelchair)?: A Little Help needed standing up from a chair using your arms (e.g., wheelchair or bedside chair)?: A Little Help needed to walk in hospital room?: A Little Help needed climbing 3-5 steps with a railing? : A Little 6 Click Score: 19    End of Session Equipment Utilized During Treatment: Gait belt Activity Tolerance: Patient tolerated treatment well Patient left: with call bell/phone within reach;in bed;with bed alarm set;with family/visitor present Nurse Communication: Mobility status PT Visit Diagnosis: Unsteadiness on feet (R26.81);Muscle weakness  (generalized) (M62.81)     Time: 3462-1947 PT Time Calculation (min) (ACUTE ONLY): 29 min  Charges:  $Gait Training: 8-22 mins $Therapeutic Exercise: 8-22 mins                     Baxter Flattery, PT  Acute Rehab Dept ([place]) [phone] Pager [phone]  07/12/2020    [facility] 07/12/2020, 5:08 PM

## 2020-07-12 NOTE — Progress Notes (Signed)
SATURATION QUALIFICATIONS: (This note is used to comply with regulatory documentation for home oxygen)  Patient Saturations on Room Air at Rest = 92%  Patient Saturations on Room Air while Ambulating = 88%  Patient Saturations on 2 Liters of oxygen while Ambulating = 94%  Please briefly explain why patient needs home oxygen: Upon ambulation pt oxygen levels drop to 88% on room air

## 2020-07-12 NOTE — Progress Notes (Signed)
HEMATOLOGY-ONCOLOGY PROGRESS NOTE  SUBJECTIVE: Mary Merritt started radiation yesterday.  No new complaint.  Oncology History  Small cell lung cancer ([place])  06/05/2020 Initial Diagnosis   Small cell lung cancer ([place])   06/14/2020 -  Chemotherapy    Patient is on Treatment Plan: LUNG SCLC CARBOPLATIN + ETOPOSIDE + ATEZOLIZUMAB INDUCTION Q21D / ATEZOLIZUMAB MAINTENANCE Q21D       PHYSICAL EXAMINATION:  Vitals:   07/12/20 1023 07/12/20 1206  BP: (!) 105/54 (!) 107/54  Pulse: 69 81  Resp:  20  Temp:  98 F (36.7 C)  SpO2:  98%   Filed Weights   07/08/20 1809  Weight: 144 lb (65.3 kg)    Intake/Output from previous day: 01/18 0701 - 01/19 0700 In: 600 [P.O.:600] Out: -   GENERAL:alert, no distress and comfortable  OROPHARYNX: No thrush or mucositis LUNGS: Diminished right base HEART: regular rate & rhythm and no murmurs and no lower extremity edema NEURO: alert & oriented x 3 with fluent speech, no focal motor/sensory deficits  LABORATORY DATA:  I have reviewed the data as listed CMP Latest Ref Rng & Units 07/11/2020 07/09/2020 07/08/2020  Glucose 70 - 99 mg/dL 122(H) 210(H) 113(H)  BUN 8 - 23 mg/dL 15 11 13   Creatinine 0.44 - 1.00 mg/dL 0.72 0.76 0.58  Sodium 135 - 145 mmol/L 130(L) 129(L) 128(L)  Potassium 3.5 - 5.1 mmol/L 4.2 3.8 3.9  Chloride 98 - 111 mmol/L 90(L) 91(L) 93(L)  CO2 22 - 32 mmol/L 29 31 24   Calcium 8.9 - 10.3 mg/dL 9.2 8.8(L) 8.9  Total Protein 6.5 - 8.1 g/dL - - 6.0(L)  Total Bilirubin 0.3 - 1.2 mg/dL - - 0.5  Alkaline Phos 38 - 126 U/L - - 63  AST 15 - 41 U/L - - 16  ALT 0 - 44 U/L - - 13    Lab Results  Component Value Date   WBC 8.2 07/11/2020   HGB 12.3 07/11/2020   HCT 37.3 07/11/2020   MCV 87.8 07/11/2020   PLT 395 07/11/2020   NEUTROABS 6.6 07/11/2020    DG Chest 1 View  Result Date: 07/10/2020 CLINICAL DATA:  Post right-sided thoracentesis EXAM: CHEST  1 VIEW COMPARISON:  07/09/2020; 07/07/2020; chest CT-07/07/2020; 05/15/2020  FINDINGS: Interval reduction in persistent small likely partially loculated right-sided effusion post thoracentesis. No pneumothorax. Improved aeration of the right lung with persistent right basilar consolidative opacities and upper lobe consolidative opacities. Unchanged cardiac silhouette and mediastinal contours with consolidative opacities about the right hilum. Slight worsening of left basilar heterogeneous opacities favored to represent atelectasis. No left-sided pleural effusion or pneumothorax. No definite evidence of edema. No acute osseous abnormalities. Old/healed left-sided posterolateral rib fractures IMPRESSION: 1. Interval reduction in persistent small likely partially loculated right-sided effusion post thoracentesis. No pneumothorax. 2. Improved aeration of the right lung with otherwise similar findings known advanced pulmonary malignancy. Electronically Signed   By: Sandi Mariscal M.D.   On: 07/10/2020 11:11   DG Chest 1 View  Result Date: 06/21/2020 CLINICAL DATA:  Status post thoracentesis EXAM: CHEST  1 VIEW COMPARISON:  June 14, 2020 chest radiograph and chest CT May 16, 2019 FINDINGS: No pneumothorax. Small residual pleural effusion on the right. There is persistent airspace opacity throughout the right upper lobe. Patchy areas of airspace opacity noted in the left mid lung and left base. Heart size and pulmonary vascular normal. No adenopathy. There is aortic atherosclerosis. No bone lesions. IMPRESSION: No pneumothorax. Airspace opacity persists in the right upper lobe.  The known mass in the right upper lobe is not well delineated given the surrounding apparent pneumonitis. Small residual right pleural effusion with right base atelectasis. New ill-defined airspace opacity left mid lung and left base may represent foci of developing pneumonia. Stable cardiac silhouette. Aortic Atherosclerosis (ICD10-I70.0). Electronically Signed   By: Lowella Grip III M.D.   On: 06/21/2020  10:59   DG Chest 1 View  Result Date: 06/14/2020 CLINICAL DATA:  Pleural effusion. Small cell lung cancer. Status post right thoracentesis. EXAM: CHEST  1 VIEW COMPARISON:  06/07/2020 FINDINGS: The cardiomediastinal silhouette is unchanged. Aortic atherosclerosis is noted. Extensive right upper lobe density corresponding to a known large mass and surrounding airspace opacity is unchanged. A moderate right pleural effusion is stable to minimally smaller than on the prior study. No pneumothorax is identified. No acute osseous abnormality is seen. IMPRESSION: No pneumothorax following thoracentesis. Moderate residual right pleural effusion. Unchanged right upper lobe density corresponding to known mass and surrounding airspace opacity. Electronically Signed   By: Logan Bores M.D.   On: 06/14/2020 10:04   DG Chest 2 View  Result Date: 07/07/2020 CLINICAL DATA:  Increasing shortness of breath for 2 weeks. Known right lung cancer. EXAM: CHEST - 2 VIEW COMPARISON:  Radiographs 07/05/2020 and 07/01/2020.  CT 05/15/2020. FINDINGS: The heart size and mediastinal contours are stable. There is aortic atherosclerosis. Right suprahilar mass, partial right lung collapse and moderate-sized right pleural effusion have not significantly changed. Known peripheral left upper lobe nodule is not well visualized. There is no pneumothorax or acute osseous abnormality. IMPRESSION: No significant change in right suprahilar mass, partial right lung collapse and moderate right pleural effusion. Electronically Signed   By: Richardean Sale M.D.   On: 07/07/2020 16:26   DG Chest 2 View  Result Date: 07/05/2020 CLINICAL DATA:  87 year old female with pleural effusion. EXAM: CHEST - 2 VIEW COMPARISON:  Chest radiograph dated 07/01/2020. FINDINGS: Slight interval increase in the right pleural effusion since the prior radiograph. There is diffuse right lung opacity. Left lung base atelectasis. No pneumothorax. Stable cardiomediastinal  silhouette. No acute osseous pathology. IMPRESSION: Slight interval increase in the right pleural effusion since the prior radiograph. Electronically Signed   By: Anner Crete M.D.   On: 07/05/2020 16:24   DG Chest 2 View  Result Date: 07/01/2020 CLINICAL DATA:  Shortness of breath. History of pleural effusion and thoracentesis. EXAM: CHEST - 2 VIEW COMPARISON:  Chest x-ray dated 06/21/2020. FINDINGS: Reaccumulating RIGHT pleural effusion, moderate in size. Stable chronic airspace opacity within the RIGHT upper lobe, better demonstrated as a mass on chest CT of 05/15/2020. No new findings on the LEFT. Heart size and mediastinal contours appear stable. IMPRESSION: 1. Reaccumulating RIGHT pleural effusion, moderate in size. 2. Stable chronic airspace opacity within the RIGHT upper lobe, better demonstrated as a mass on chest CT of 05/15/2020. Electronically Signed   By: Franki Cabot M.D.   On: 07/01/2020 15:15   CT Angio Chest PE W and/or Wo Contrast  Result Date: 07/07/2020 CLINICAL DATA:  Chest pain or shortness of breath.  Lung masses EXAM: CT ANGIOGRAPHY CHEST WITH CONTRAST TECHNIQUE: Multidetector CT imaging of the chest was performed using the standard protocol during bolus administration of intravenous contrast. Multiplanar CT image reconstructions and MIPs were obtained to evaluate the vascular anatomy. CONTRAST:  17mL OMNIPAQUE IOHEXOL 350 MG/ML SOLN COMPARISON:  Chest CT 05/15/2020 FINDINGS: Cardiovascular: Contrast injection is sufficient to demonstrate satisfactory opacification of the pulmonary arteries to the segmental level.  There is no pulmonary embolus or evidence of right heart strain. The size of the main pulmonary artery is normal. Heart size is normal, with no pericardial effusion. The course and caliber of the aorta are normal. There is mild atherosclerosis. No acute finding. Mediastinum/Nodes: Unchanged appearance of large prevascular node measuring 2.5 cm. Lungs/Pleura: Large tumor  in the right lung with surrounding pleural fluid. There is pleural nodularity along the right minor fissure. Multiple nodular opacities at the right lower lobe. Tumor surrounds the right mainstem bronchus and severely narrows the right upper lobe bronchus. The right pulmonary artery is also encased. Tumor size is increased. Upper Abdomen: Contrast bolus timing is not optimized for evaluation of the abdominal organs. The visualized portions of the organs of the upper abdomen are normal. Musculoskeletal: No chest wall abnormality. No bony spinal canal stenosis. Review of the MIP images confirms the above findings. IMPRESSION: 1. No pulmonary embolus or acute aortic syndrome. 2. 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 Tumor surrounds the right mainstem bronchus and severely narrows the right upper lobe bronchus. The right pulmonary artery is also encased. 3. Multiple nodular opacities at the right lung base, concerning for metastatic disease versus areas of consolidation. 4. Unchanged appearance of large prevascular node. Aortic Atherosclerosis (ICD10-I70.0). Electronically Signed   By: Ulyses Jarred M.D.   On: 07/07/2020 21:46   DG CHEST PORT 1 VIEW  Result Date: 07/09/2020 CLINICAL DATA:  Pleural effusion shortness of breath. EXAM: PORTABLE CHEST 1 VIEW COMPARISON:  07/07/2020 FINDINGS: Lungs are adequately inflated demonstrate no change in patient's known masslike density over the right suprahilar region. There is interval worsening of a moderate size right pleural effusion. Likely associated atelectasis in the right base. Left lung is clear. Cardiomediastinal silhouette and remainder of the exam is unchanged. IMPRESSION: 1. Interval worsening moderate size right pleural effusion likely with associated atelectasis right base. 2. Stable masslike density over the right suprahilar region. Electronically Signed   By: Marin Olp M.D.   On: 07/09/2020 14:27   DG Chest Port 1 View  Result  Date: 07/01/2020 CLINICAL DATA:  Post right-sided thoracentesis. EXAM: PORTABLE CHEST 1 VIEW COMPARISON:  Radiograph 07/01/2020, CT 05/15/2020 FINDINGS: Slight decreased right pleural effusion after thoracentesis. No visualized pneumothorax or pleural line. Opacity in the right lung most prominent in the suprahilar region is unchanged from earlier today. Stable heart size and mediastinal contours. Aortic atherosclerosis. Mild atelectasis at the left lung base. Lingular nodule on CT is not well seen by radiograph. Bones are diffusely under mineralized. Remote left rib fractures. IMPRESSION: 1. Slight decreased right pleural effusion after thoracentesis. No visualized pneumothorax. 2. Unchanged right upper lung zone opacity, right upper lobe mass on prior CT. Electronically Signed   By: Keith Rake M.D.   On: 07/01/2020 19:39   DG ABD ACUTE 2+V W 1V CHEST  Result Date: 06/16/2020 CLINICAL DATA:  Constipation.  History of lung cancer. EXAM: DG ABDOMEN ACUTE WITH 1 VIEW CHEST COMPARISON:  Chest x-ray dated 06/14/2020 FINDINGS: Diffuse airspace opacification is again noted throughout the right lung field. There is a right-sided pleural effusion. There is right-sided volume loss. Aortic calcifications are noted. The heart size is stable. There is no pneumothorax. There is a large amount of stool throughout the colon. The bowel gas pattern is nonobstructive. There are degenerative changes throughout the lumbar spine. The patient is status post prior total hip arthroplasty bilaterally. IMPRESSION: 1. Nonobstructive bowel gas pattern. 2. Large amount of stool throughout  the colon. 3. Stable appearance of the chest. Electronically Signed   By: Constance Holster M.D.   On: 06/16/2020 18:35   IR THORACENTESIS ASP PLEURAL SPACE W/IMG GUIDE  Result Date: 06/14/2020 INDICATION: Patient with recurrent right malignant effusion. Request is made for therapeutic right thoracentesis. EXAM: ULTRASOUND GUIDED THERAPEUTIC  RIGHT THORACENTESIS MEDICATIONS: 10 mL 1% lidocaine COMPLICATIONS: None immediate. PROCEDURE: An ultrasound guided thoracentesis was thoroughly discussed with the patient and questions answered. The benefits, risks, alternatives and complications were also discussed. The patient understands and wishes to proceed with the procedure. Written consent was obtained. Ultrasound was performed to localize and mark an adequate pocket of fluid in the right chest. The area was then prepped and draped in the normal sterile fashion. 1% Lidocaine was used for local anesthesia. Under ultrasound guidance a 6 Fr Safe-T-Centesis catheter was introduced. Thoracentesis was performed. The catheter was removed and a dressing applied. FINDINGS: A total of approximately 1.4 liters of dark, yellow fluid was removed. Samples were sent to the laboratory as requested by the clinical team. IMPRESSION: Successful ultrasound guided therapeutic right thoracentesis yielding 1.4 liters of pleural fluid. Read by: Brynda Greathouse PA-C Electronically Signed   By: Sandi Mariscal M.D.   On: 06/14/2020 12:39   US THORACENTESIS ASP PLEURAL SPACE W/IMG GUIDE  Result Date: 07/10/2020 INDICATION: Patient with history of small cell lung cancer, dyspnea, recurrent right pleural effusion. Request received for therapeutic right thoracentesis. EXAM: ULTRASOUND GUIDED THERAPEUTIC RIGHT THORACENTESIS MEDICATIONS: 1% lidocaine to skin and subcutaneous tissue COMPLICATIONS: None immediate. PROCEDURE: An ultrasound guided thoracentesis was thoroughly discussed with the patient and questions answered. The benefits, risks, alternatives and complications were also discussed. The patient understands and wishes to proceed with the procedure. Written consent was obtained. Ultrasound was performed to localize and mark an adequate pocket of fluid in the right chest. The area was then prepped and draped in the normal sterile fashion. 1% Lidocaine was used for local anesthesia.  Under ultrasound guidance a 6 Fr Safe-T-Centesis catheter was introduced. Thoracentesis was performed. The catheter was removed and a dressing applied. FINDINGS: A total of approximately 1.1 liters of amber fluid was removed. Due to patient coughing only the above amount of fluid was removed today. IMPRESSION: Successful ultrasound guided therapeutic right thoracentesis yielding 1.1 liters of pleural fluid. Read by: Rowe Robert, PA-C Electronically Signed   By: Sandi Mariscal M.D.   On: 07/10/2020 10:55   US Thoracentesis Asp Pleural space w/IMG guide  Result Date: 06/21/2020 INDICATION: Patient with a history of lung cancer and recurrent right pleural effusion. Interventional radiology asked to perform a therapeutic thoracentesis. EXAM: ULTRASOUND GUIDED THORACENTESIS MEDICATIONS: 1% lidocaine 10 mL COMPLICATIONS: None immediate. PROCEDURE: An ultrasound guided thoracentesis was thoroughly discussed with the patient and questions answered. The benefits, risks, alternatives and complications were also discussed. The patient understands and wishes to proceed with the procedure. Written consent was obtained. Ultrasound was performed to localize and mark an adequate pocket of fluid in the right chest. The area was then prepped and draped in the normal sterile fashion. 1% Lidocaine was used for local anesthesia. Under ultrasound guidance a 6 Fr Safe-T-Centesis catheter was introduced. Thoracentesis was performed. The catheter was removed and a dressing applied. FINDINGS: A total of approximately 1 L of amber-colored fluid was removed. IMPRESSION: Successful ultrasound guided right thoracentesis yielding 1 L of pleural fluid. Read by: Soyla Dryer, NP Electronically Signed   By: Jerilynn Mages.  Shick M.D.   On: 06/21/2020 11:13  ASSESSMENT AND PLAN: 1. Right lung mass concerning for malignancy -05/15/2020-CT chest with contrast showed a 7.2 x 5.9 cm right upper lobe lung mass with invasion into the right hilum and  mediastinum, extensive mediastinal and right hilar lymphadenopathy, extensive interstitial spread of tumor in the right lung and large loculated right pleural fluid collection likely secondary to pleural metastatic disease, scattered pulmonary nodules in the left lung suspicious for metastatic disease, 13 x 10.5 mm spiculated lesion in the lingula which could be a second primary. -Cytology on pleural fluid 11/23/2021positivefor malignant cells;cluster of cells positive forMOC31, TTF-1,synaptophysinand CD 56; possibility of small cell carcinoma raised -Cytology on pleural fluid 05/26/2020-malignant cells present with immunophenotype raising possibility of small cell carcinoma -Biopsy right supraclavicular lymph node 06/09/2020-small cell carcinoma -Cycle 1 modified Carboplatin/Etoposide plus atezolizumab 06/14/2020 -CT chest 07/07/2020- large tumor in the right chest surrounding the right mainstem bronchus and narrowing the right upper lobe bronchus, unchanged large prevascular node -Palliative radiation to the right chest beginning 07/11/2020 2. Pleural effusion-malignant 3.Postobstructive pneumonia? 4. Hyponatremia 5. Mild normocytic anemia 6. Thrombocytosis 7. Hypertension 8. AAA status post repair in 2009 9. PAD 10. Hyperlipidemia 11. Depression  12. 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  Hospital admission 07/07/2020-dyspnea secondary to lung tumor and pleural effusion  Mary Merritt appears unchanged.  She started chest radiation yesterday and is scheduled to complete 10 fractions.  She was evaluated by physical therapy.  Continuation of physical therapy was recommended. I discussed the case with her daughter today.  The family does not feel comfortable with Mary Merritt living alone in her current condition.  Her family is unable to provide 24-hour care.  They would like to explore skilled nursing facility availability with the hope for her to return home after a few  weeks.  Recommendations: 1.  Continue palliative radiation to the right chest 2.  Social work consult for skilled nursing facility placement 3.  Repeat right thoracentesis as needed  4.  Outpatient follow-up will be scheduled at the [facility] at the completion of radiation  LOS: 5 days   [place], AGPCNP-BC, AOCNP 07/12/20

## 2020-07-13 ENCOUNTER — Ambulatory Visit
Admit: 2020-07-13 | Discharge: 2020-07-13 | Disposition: A | Discharge status: Home / Self Care | Payer: Medicare Other | Attending: Radiation Oncology | Admitting: Radiation Oncology

## 2020-07-13 LAB — RENAL FUNCTION PANEL
Albumin: 2.8 g/dL — ABNORMAL LOW (ref 3.5–5.0)
Anion gap: 11 (ref 5–15)
BUN: 15 mg/dL (ref 8–23)
CO2: 29 mmol/L (ref 22–32)
Calcium: 9 mg/dL (ref 8.9–10.3)
Chloride: 88 mmol/L — ABNORMAL LOW (ref 98–111)
Creatinine, Ser: 0.59 mg/dL (ref 0.44–1.00)
GFR, Estimated: >60 mL/min (ref >60)
Glucose, Bld: 122 mg/dL — ABNORMAL HIGH (ref 70–99)
Phosphorus: 3 mg/dL (ref 2.5–4.6)
Potassium: 4 mmol/L (ref 3.5–5.1)
Sodium: 128 mmol/L — ABNORMAL LOW (ref 135–145)

## 2020-07-13 LAB — MAGNESIUM: Magnesium: 1.5 mg/dL — ABNORMAL LOW (ref 1.7–2.4)

## 2020-07-13 MED ORDER — SODIUM CHLORIDE 1 G PO TABS
1.0000 g | ORAL_TABLET | Freq: Two times a day (BID) | ORAL | Status: DC
  Administered 2020-07-13 – 2020-07-14 (×3): 1 g via ORAL
  Filled 2020-07-13 (×3): qty 1

## 2020-07-13 MED ORDER — MAGNESIUM SULFATE 2 GM/50ML IV SOLN
2.0000 g | Freq: Once | INTRAVENOUS | Status: AC
  Administered 2020-07-13: 2 g via INTRAVENOUS
  Filled 2020-07-13: qty 50

## 2020-07-13 NOTE — Progress Notes (Signed)
While rounding on the unit Chaplain received a nurse referral to drop in for a visit w/ pt who "likes to talk."  Chaplain found pt sitting up in a chair.  Pt pleasant, but reticent.  Pt denied needing anything from [place].  Short visit.  [place]

## 2020-07-13 NOTE — Evaluation (Signed)
Occupational Therapy Evaluation Patient Details Name: Mary Merritt MRN: [id number] DOB: 01-Oct-1927 Today's Date: 07/13/2020    History of Present Illness Pt is 87 yo female admitted with worsening DOE.  She was admitted with PNE and lung mass. Pt scheduled for thoracentesis later today.   PMH is significant for HTN, hyperlipidemia, AAA s/p repair, L THA, and R hip OA.   Clinical Impression   Mrs. Mary Merritt is a 87 year old woman who presents with generalized weakness, decreased activity tolerance and impaired cardiopulmonary endurance resulting in ability to perform independent ADLs and mobility and making her at risk for falls and further injury. Patient on 3 L [place] with normal o2 sats but doesn't typically use oxygen at home. Patient reports fatigue and needing to rest after toileting and grooming task. Patient will benefit from skilled OT services while in hospital to improve deficits and learn compensatory strategies as needed in order to independence.  Patient reports living alone and not feeling like she has the strength to return home and wanting to go to rehab. Patient will benenfit from short term rehab at discharge.    Follow Up Recommendations  SNF    Equipment Recommendations  None recommended by OT    Recommendations for Other Services       Precautions / Restrictions Precautions Precautions: Fall Restrictions Weight Bearing Restrictions: No      Mobility Bed Mobility Overal bed mobility: Needs Assistance Bed Mobility: Supine to Sit     Supine to sit: HOB elevated;Min guard          Transfers Overall transfer level: Needs assistance Equipment used: Rolling walker (2 wheeled) Transfers: Sit to/from [facility] Sit to Stand: Min guard Stand pivot transfers: Min guard       General transfer comment: Min guard to ambulate with RW    Balance Overall balance assessment: No apparent balance deficits (not formally assessed)                                          ADL either performed or assessed with clinical judgement   ADL Overall ADL's : Needs assistance/impaired Eating/Feeding: Independent   Grooming: Supervision/safety;Standing Grooming Details (indicate cue type and reason): standing at sink Upper Body Bathing: Set up;Sitting   Lower Body Bathing: Set up;Sit to/from stand   Upper Body Dressing : Set up;Sitting   Lower Body Dressing: Sit to/from stand;Minimal assistance   Toilet Transfer: Supervision/safety;Regular Toilet;Grab bars;RW Toilet Transfer Details (indicate cue type and reason): ambulated to bathroom on 3 L [place] and with RW Toileting- Clothing Manipulation and Hygiene: Supervision/safety;Sit to/from stand       Functional mobility during ADLs: Min guard;Rolling walker       Vision Patient Visual Report: No change from baseline Vision Assessment?: No apparent visual deficits     Perception     Praxis      Pertinent Vitals/Pain Pain Assessment: No/denies pain     Hand Dominance Right   Extremity/Trunk Assessment Upper Extremity Assessment Upper Extremity Assessment: Overall WFL for tasks assessed   Lower Extremity Assessment Lower Extremity Assessment: Defer to PT evaluation   Cervical / Trunk Assessment Cervical / Trunk Assessment: Normal   Communication Communication Communication: No difficulties   Cognition Arousal/Alertness: Awake/alert Behavior During Therapy: WFL for tasks assessed/performed Overall Cognitive Status: Within Functional Limits for tasks assessed  General Comments       Exercises     Shoulder Instructions      Home Living Family/patient expects to be discharged to:: Private residence Living Arrangements: Alone Available Help at Discharge: Family;Available PRN/intermittently Type of Home: House Home Access: Stairs to enter [employer] of Steps: 3 Entrance Stairs-Rails:  Left Home Layout: One level               Home Equipment: Walker - 2 wheels;Cane - single point;Shower seat          Prior Functioning/Environment Level of Independence: Needs assistance  Gait / Transfers Assistance Needed: Ambulates without AD typically ; limited community distance;pt endorses 1 fall outside whil getting her mail, had to have her son come assist her to get up ADL's / Homemaking Assistance Needed: Reports independent with ADLs and light IADLs   Comments: family assists with laundry and other heavier household tasks        OT Problem List: Decreased activity tolerance;Cardiopulmonary status limiting activity;Decreased strength      OT Treatment/Interventions: Self-care/ADL training;Therapeutic exercise;Energy conservation;DME and/or AE instruction;Patient/family education;Therapeutic activities    OT Goals(Current goals can be found in the care plan section) Acute Rehab OT Goals Patient Stated Goal: to get stronger before going home OT Goal Formulation: With patient Time For Goal Achievement: 07/27/20 Potential to Achieve Goals: Good  OT Frequency: Min 2X/week   Barriers to D/C:            Co-evaluation              AM-PAC OT "6 Clicks" Daily Activity     Outcome Measure Help from another person eating meals?: None Help from another person taking care of personal grooming?: None Help from another person toileting, which includes using toliet, bedpan, or urinal?: None Help from another person bathing (including washing, rinsing, drying)?: A Little Help from another person to put on and taking off regular upper body clothing?: None Help from another person to put on and taking off regular lower body clothing?: A Little 6 Click Score: 22   End of Session Equipment Utilized During Treatment: Rolling walker;Oxygen Nurse Communication: Mobility status  Activity Tolerance: Patient limited by fatigue Patient left: in chair;with call bell/phone  within reach  OT Visit Diagnosis: Muscle weakness (generalized) (M62.81)                Time: 9390-3009 OT Time Calculation (min): 20 min Charges:  OT General Charges $OT Visit: 1 Visit OT Evaluation $OT Eval Low Complexity: 1 Low OT Treatments $Self Care/Home Management : 8-22 mins  Mary Merritt, OTR/L [place]  Office [phone] Pager: [place] 07/13/2020, 12:58 PM

## 2020-07-13 NOTE — Plan of Care (Signed)
  Problem: Education: Goal: Knowledge of General Education information will improve Description: Including pain rating scale, medication(s)/side effects and non-pharmacologic comfort measures Outcome: Progressing   Problem: Clinical Measurements: Goal: Ability to maintain clinical measurements within normal limits will improve Outcome: Progressing Goal: Cardiovascular complication will be avoided Outcome: Progressing   

## 2020-07-13 NOTE — NC FL2 (Signed)
[place] LEVEL OF CARE SCREENING TOOL     IDENTIFICATION  Patient Name: Mary Merritt Birthdate: [birth year] Sex: female Admission Date (Current Location): 07/07/2020  [facility] and [place] Number:  Herbalist and Address:  [facility],  [place] [address] [place]      Provider Number: [id number]  Attending Physician Name and Address:  Mercy Riding, MD  Relative Name and Phone Number:  Breck Coons [phone]    Current Level of Care: Hospital Recommended Level of Care: [place] Prior Approval Number:    Date Approved/Denied:   PASRR Number:    Discharge Plan: SNF    Current Diagnoses: Patient Active Problem List   Diagnosis Date Noted  . Pleural effusion 07/07/2020  . Small cell lung cancer ([place]) 06/05/2020  . Goals of care, counseling/discussion 06/05/2020  . Abnormal chest x-ray   . Pneumonia 05/14/2020  . Pain in right foot 11/17/2019  . Hyponatremia 11/22/2017  . Nondisplaced fracture of left tibial spine, subsequent encounter for closed fracture with routine healing 08/05/2017  . Chronic pain of left knee 07/21/2017  . Presence of left artificial hip joint 05/30/2016  . Hip joint replacement status 03/27/2016  . S/P hip replacement 07/27/2015  . Osteoarthritis of right hip 08/01/2014  . Essential hypertension 05/31/2014  . Mixed hyperlipidemia 05/31/2014  . S/P AAA repair 05/31/2014  . Bradycardia 05/31/2014  . Murmur 05/31/2014    Orientation RESPIRATION BLADDER Height & Weight     Self,Time,Situation  O2 Continent Weight: 65.3 kg Height:  5\' 5"  (165.1 cm)  BEHAVIORAL SYMPTOMS/MOOD NEUROLOGICAL BOWEL NUTRITION STATUS      Continent Diet (Heart Healthy)  AMBULATORY STATUS COMMUNICATION OF NEEDS Skin   Limited Assist Verbally PU Stage and Appropriate Care (sacrum-foam) PU Stage 1 Dressing: Daily                     Personal Care Assistance Level of Assistance   Bathing,Feeding,Dressing Bathing Assistance: Limited assistance Feeding assistance: Limited assistance Dressing Assistance: Limited assistance     Functional Limitations Info  Sight,Hearing,Speech Sight Info: Impaired (eyeglasses) Hearing Info: Impaired (has bilateral hearing aids but not using.) Speech Info: Adequate    SPECIAL CARE FACTORS FREQUENCY  PT (By licensed PT),OT (By licensed OT)     PT Frequency: 5x week OT Frequency: 5x week            Contractures Contractures Info: Not present    Additional Factors Info  Code Status,Allergies Code Status Info:  (Partial code) Allergies Info:  (Percocet (Oxycodone-acetaminophen)           Current Medications (07/13/2020):  This is the current hospital active medication list Current Facility-Administered Medications  Medication Dose Route Frequency Provider Last Rate Last Admin  . acetaminophen (TYLENOL) tablet 650 mg  650 mg Oral Q6H PRN Lovey Newcomer T, NP   650 mg at 07/13/20 0924  . amLODipine (NORVASC) tablet 10 mg  10 mg Oral Daily Elwyn Reach, MD   10 mg at 07/13/20 0998  . escitalopram (LEXAPRO) tablet 10 mg  10 mg Oral Daily Elwyn Reach, MD   10 mg at 07/13/20 3382  . feeding supplement (ENSURE ENLIVE / ENSURE PLUS) liquid 237 mL  237 mL Oral TID BM Hosie Poisson, MD   237 mL at 07/12/20 2029  . heparin injection 5,000 Units  5,000 Units Subcutaneous Q8H Hosie Poisson, MD   5,000 Units at 07/13/20 [phone]  .  losartan (COZAAR) tablet 50 mg  50 mg Oral QHS Elwyn Reach, MD   50 mg at 07/12/20 2122  . magnesium oxide (MAG-OX) tablet 200 mg  200 mg Oral QODAY Garba, Mohammad L, MD   200 mg at 07/12/20 1019  . metoprolol tartrate (LOPRESSOR) tablet 12.5 mg  12.5 mg Oral BID Gala Romney L, MD   12.5 mg at 07/13/20 9244  . multivitamin with minerals tablet 1 tablet  1 tablet Oral Daily Hosie Poisson, MD   1 tablet at 07/13/20 0921  . omega-3 acid ethyl esters (LOVAZA) capsule 2 g  2 g Oral Daily Elwyn Reach, MD   2 g at 07/12/20 2122  . ondansetron (ZOFRAN) tablet 4 mg  4 mg Oral Q6H PRN Elwyn Reach, MD       Or  . ondansetron (ZOFRAN) injection 4 mg  4 mg Intravenous Q6H PRN Gala Romney L, MD      . polyethylene glycol (MIRALAX / GLYCOLAX) packet 17 g  17 g Oral Daily Lang Snow, NP   17 g at 07/13/20 0919  . pravastatin (PRAVACHOL) tablet 20 mg  20 mg Oral q1800 Elwyn Reach, MD   20 mg at 07/12/20 1813  . traMADol (ULTRAM) tablet 50 mg  50 mg Oral Q8H PRN Hosie Poisson, MD   50 mg at 07/11/20 1439     Discharge Medications: Please see discharge summary for a list of discharge medications.  Relevant Imaging Results:  Relevant Lab Results:   Additional Information SS#[id number]. Radiation 5 days/week until 07/31/20-family will provide transportation.  Dessa Phi, RN

## 2020-07-13 NOTE — TOC Progression Note (Incomplete Revision)
Transition of Care [facility]) - Progression Note    Patient Details  Name: Mary Merritt MRN: [id number] Date of Birth: December 02, 1927  Transition of Care [facility]) CM/SW Contact  Tarvares Lant, Juliann Pulse, RN Phone Number: 07/13/2020, 10:19 AM  Clinical Narrative: Faxed out await bed offers.  2:19p-dtr Vicky chose [facility]-initiated auth [id number]/TCN clinicals #[phone].  2:36p-    Expected Discharge Plan: [place] Barriers to Discharge: Continued Medical Work up  Expected Discharge Plan and Services Expected Discharge Plan: [place]   Discharge Planning Services: CM Consult   Living arrangements for the past 2 months: Single Family Home                                       Social Determinants of Health (SDOH) Interventions    Readmission Risk Interventions No flowsheet data found.

## 2020-07-13 NOTE — Progress Notes (Signed)
PROGRESS NOTE  Mary Merritt WYO:[id number] DOB: 03/20/1928   PCP: Katherina Mires, MD  Patient is from: Home  DOA: 07/07/2020 LOS: 6  Chief complaints: Shortness of breath  Brief Narrative / Interim history: 87 year old female with PMH of stage IV lung cancer, recurrent malignant pleural effusion, AAA, HTN and HLD who was directed to ED from oncology clinic due to worsening dyspnea.  CT chest showed large tumor in right lung with mass-effect and surrounding pleural fluid.  She underwent US-guided core with removal of 1.1 L pleural fluid.  Radiation oncology recommended 3 weeks of radiotherapy, that has started on 07/12/2020.  Therapy recommended SNF.   Subjective: Seen and examined earlier this morning.  No major events overnight of this morning.  No complaints other than intermittent right-sided chest pain.  Currently she is pain-free.  Has some soft blood pressure this morning but not symptomatic.  Objective: Vitals:   07/12/20 1206 07/12/20 2047 07/13/20 0603 07/13/20 1325  BP: (!) 107/54 (!) 117/57 (!) 114/58 (!) 107/54  Pulse: 81 88 75 (!) 55  Resp: 20 18 18 18   Temp: 98 F (36.7 C) 98.1 F (36.7 C) 98.3 F (36.8 C) 97.9 F (36.6 C)  TempSrc: Oral   Oral  SpO2: 98% 100% 98% 97%  Weight:      Height:       No intake or output data in the 24 hours ending 07/13/20 1357 Filed Weights   07/08/20 1809  Weight: 65.3 kg    Examination:  GENERAL: No apparent distress.  Nontoxic. HEENT: MMM.  Vision and hearing grossly intact.  NECK: Supple.  No apparent JVD.  RESP: 98% on 3 L.  No IWOB.  Rhonchi/crackles over lower lung fields. CVS:  RRR. Heart sounds normal.  ABD/GI/GU: BS+. Abd soft, NTND.  MSK/EXT:  Moves extremities. No apparent deformity. No edema.  SKIN: no apparent skin lesion or wound NEURO: Awake, alert and oriented appropriately.  No apparent focal neuro deficit. PSYCH: Calm. Normal affect.  Procedures:  1/17-right-sided US-guided thoracocentesis with  removal of 1.1 L  Microbiology summarized: Influenza and COVID-19 PCR negative.  Assessment & Plan: Acute respiratory failure with hypoxia due to recurrent malignant pleural effusion and worsening of metastatic lung cancer with mass effect on right bronchus.  Desaturated to 88% requiring 2 L to maintain saturation above 90% with ambulation.  Currently requiring 3 L. -S/p thoracocentesis with removal of 1.1 L pleural effusion on 1/17.  Fluid was not sent for study. -Started radiation therapy on 07/12/2020 -OOB/PT/OT/incentive spirometry -Wean oxygen as able -Oncology and radiation oncology following.  Essential hypertension: Normotensive but somewhat soft. -Hold amlodipine and losartan-received morning dose. -Continue reduced dose of metoprolol.  Hyponatremia secondary to SIADH. Na 128> 130> 128 -Continue monitoring -Start p.o. sodium chloride 1 g twice daily.  Hypomagnesemia: Mg 1.5. -Replenish and recheck  Generalized weakness/physical deconditioning -PT/OT recommended SNF.  Hyperlipidemia: On pravastatin.  Doubt utility of this as primary prevention.  Cancer related pain: -Continue Tylenol and tramadol.  Body mass index is 23.96 kg/m. Nutrition Problem: Increased nutrient needs Etiology: chronic illness (metastatic lung cancer) Signs/Symptoms: estimated needs Interventions: Ensure Enlive (each supplement provides 350kcal and 20 grams of protein),MVI   DVT prophylaxis:  heparin injection 5,000 Units Start: 07/08/20 1400 SCDs Start: 07/07/20 2209  Code Status: Partial code Family Communication: Updated patient's daughter over the phone. Status is: Inpatient  Remains inpatient appropriate because:Ongoing diagnostic testing needed not appropriate for outpatient work up and Inpatient level of care appropriate due to  severity of illness   Dispo: The patient is from: Home              Anticipated d/c is to: Home              Anticipated d/c date is: 1 day               Patient currently is medically stable to d/c.       Consultants:  Oncology Radiation oncology Interventional radiology   Sch Meds:  Scheduled Meds: . escitalopram  10 mg Oral Daily  . feeding supplement  237 mL Oral TID BM  . heparin injection (subcutaneous)  5,000 Units Subcutaneous Q8H  . metoprolol tartrate  12.5 mg Oral BID  . multivitamin with minerals  1 tablet Oral Daily  . omega-3 acid ethyl esters  2 g Oral Daily  . polyethylene glycol  17 g Oral Daily  . pravastatin  20 mg Oral q1800  . sodium chloride  1 g Oral BID WC   Continuous Infusions: . magnesium sulfate bolus IVPB     PRN Meds:.acetaminophen, ondansetron **OR** ondansetron (ZOFRAN) IV, traMADol  Antimicrobials: Anti-infectives (From admission, onward)   None       I have personally reviewed the following labs and images: CBC: Recent Labs  Lab 07/07/20 1812 07/08/20 0423 07/11/20 0717  WBC 9.5 9.5 8.2  NEUTROABS 7.5  --  6.6  HGB 12.7 12.0 12.3  HCT 37.7 36.2 37.3  MCV 86.7 86.0 87.8  PLT 466* 410* 395   BMP &GFR Recent Labs  Lab 07/07/20 1812 07/08/20 0423 07/09/20 1102 07/11/20 0717 07/13/20 0552  NA 127* 128* 129* 130* 128*  K 4.1 3.9 3.8 4.2 4.0  CL 90* 93* 91* 90* 88*  CO2 23 24 31 29 29   GLUCOSE 128* 113* 210* 122* 122*  BUN 18 13 11 15 15   CREATININE 0.69 0.58 0.76 0.72 0.59  CALCIUM 9.2 8.9 8.8* 9.2 9.0  MG  --   --   --   --  1.5*  PHOS  --   --   --   --  3.0   Estimated Creatinine Clearance: 42.1 mL/min (by C-G formula based on SCr of 0.59 mg/dL). Liver & Pancreas: Recent Labs  Lab 07/07/20 1812 07/08/20 0423 07/13/20 0552  AST 17 16  --   ALT 14 13  --   ALKPHOS 65 63  --   BILITOT 0.5 0.5  --   PROT 6.4* 6.0*  --   ALBUMIN 2.9* 2.7* 2.8*   No results for input(s): LIPASE, AMYLASE in the last 168 hours. No results for input(s): AMMONIA in the last 168 hours. Diabetic: No results for input(s): HGBA1C in the last 72 hours. No results for input(s):  GLUCAP in the last 168 hours. Cardiac Enzymes: No results for input(s): CKTOTAL, CKMB, CKMBINDEX, TROPONINI in the last 168 hours. No results for input(s): PROBNP in the last 8760 hours. Coagulation Profile: Recent Labs  Lab 07/07/20 1837  INR 1.0   Thyroid Function Tests: No results for input(s): TSH, T4TOTAL, FREET4, T3FREE, THYROIDAB in the last 72 hours. Lipid Profile: No results for input(s): CHOL, HDL, LDLCALC, TRIG, CHOLHDL, LDLDIRECT in the last 72 hours. Anemia Panel: No results for input(s): VITAMINB12, FOLATE, FERRITIN, TIBC, IRON, RETICCTPCT in the last 72 hours. Urine analysis:    Component Value Date/Time   COLORURINE YELLOW 12/23/2018 2140   APPEARANCEUR HAZY (A) 12/23/2018 2140   LABSPEC 1.013 12/23/2018 2140   PHURINE 6.0 12/23/2018  2140   GLUCOSEU NEGATIVE 12/23/2018 2140   HGBUR SMALL (A) 12/23/2018 2140   BILIRUBINUR NEGATIVE 12/23/2018 2140   [place] 12/23/2018 2140   PROTEINUR NEGATIVE 12/23/2018 2140   UROBILINOGEN 0.2 07/21/2014 1356   NITRITE POSITIVE (A) 12/23/2018 2140   LEUKOCYTESUR MODERATE (A) 12/23/2018 2140   Sepsis Labs: Invalid input(s): PROCALCITONIN, [place]  Microbiology: Recent Results (from the past 240 hour(s))  Resp Panel by RT-PCR (Flu A&B, Covid) Nasopharyngeal Swab     Status: None   Collection Time: 07/07/20  9:25 PM   Specimen: Nasopharyngeal Swab; Nasopharyngeal(NP) swabs in vial transport medium  Result Value Ref Range Status   SARS Coronavirus 2 by RT PCR NEGATIVE NEGATIVE Final    Comment: (NOTE) SARS-CoV-2 target nucleic acids are NOT DETECTED.  The SARS-CoV-2 RNA is generally detectable in upper respiratory specimens during the acute phase of infection. The lowest concentration of SARS-CoV-2 viral copies this assay can detect is 138 copies/mL. A negative result does not preclude SARS-Cov-2 infection and should not be used as the sole basis for treatment or other patient management decisions. A  negative result may occur with  improper specimen collection/handling, submission of specimen other than nasopharyngeal swab, presence of viral mutation(s) within the areas targeted by this assay, and inadequate number of viral copies(<138 copies/mL). A negative result must be combined with clinical observations, patient history, and epidemiological information. The expected result is Negative.  Fact Sheet for Patients:  EntrepreneurPulse.com.au  Fact Sheet for Healthcare Providers:  IncredibleEmployment.be  This test is no t yet approved or cleared by the [place] FDA and  has been authorized for detection and/or diagnosis of SARS-CoV-2 by FDA under an Emergency Use Authorization (EUA). This EUA will remain  in effect (meaning this test can be used) for the duration of the COVID-19 declaration under Section 564(b)(1) of the Act, 21 U.S.C.section 360bbb-3(b)(1), unless the authorization is terminated  or revoked sooner.       Influenza A by PCR NEGATIVE NEGATIVE Final   Influenza B by PCR NEGATIVE NEGATIVE Final    Comment: (NOTE) The Xpert Xpress SARS-CoV-2/FLU/RSV plus assay is intended as an aid in the diagnosis of influenza from Nasopharyngeal swab specimens and should not be used as a sole basis for treatment. Nasal washings and aspirates are unacceptable for Xpert Xpress SARS-CoV-2/FLU/RSV testing.  Fact Sheet for Patients: EntrepreneurPulse.com.au  Fact Sheet for Healthcare Providers: IncredibleEmployment.be  This test is not yet approved or cleared by the [place] FDA and has been authorized for detection and/or diagnosis of SARS-CoV-2 by FDA under an Emergency Use Authorization (EUA). This EUA will remain in effect (meaning this test can be used) for the duration of the COVID-19 declaration under Section 564(b)(1) of the Act, 21 U.S.C. section 360bbb-3(b)(1), unless the authorization  is terminated or revoked.  Performed at [facility], [place] [address], [place], [place] [address]     Radiology Studies: No results found.    Catalena Stanhope T. Whittemore  If 7PM-7AM, please contact night-coverage [email or web address] 07/13/2020, 1:57 PM

## 2020-07-13 NOTE — Progress Notes (Signed)
Nutrition Follow-up  INTERVENTION:   -Ensure Enlive po TID, each supplement provides 350 kcal and 20 grams of protein  -Multivitamin with minerals daily  -Magic cup BID with meals, each supplement provides 290 kcal and 9 grams of protein  NUTRITION DIAGNOSIS:   Increased nutrient needs related to chronic illness (metastatic lung cancer) as evidenced by estimated needs.  Ongoing.  GOAL:   Patient will meet greater than or equal to 90% of their needs  Not meeting.  MONITOR:   PO intake,Supplement acceptance,Labs,Weight trends,Skin,I & O's  ASSESSMENT:   Mary Merritt is a 87 y.o. female with medical history significant of stage IV lung cancer with recurrent malignant pleural effusion,, AAA , Hypertension, Hyperlipidemia was seen yesterday in oncology clinic and referred to ED for admission for worsening sob.   Pt admitted with respiratory failure secondary to recurrent malignant pleural effusion and worsening metastatic lung cancer.  1/17: s/p thoracentesis, 1.1L yield  Patient consuming 0-25% of meals at this time.  Pt accepting Ensure supplements most days. Not today.   Pt started palliative radiation on 1/18.  Per MD note, plan is to pursue SNF at discharge.  Admission weight:144 lbs. No other weights for admission.  Medications: MAG-OX, Multivitamin with minerals daily, Lovaza, Miralax   Labs reviewed: Low Na, Mg  NUTRITION - FOCUSED PHYSICAL EXAM:  Out of room  Diet Order:   Diet Order            Diet Heart Room service appropriate? Yes; Fluid consistency: Thin  Diet effective now                 EDUCATION NEEDS:   No education needs have been identified at this time  Skin:  Skin Assessment: Reviewed RN Assessment  Last BM:  1/19  Height:   Ht Readings from Last 1 Encounters:  07/08/20 5\' 5"  (1.651 m)    Weight:   Wt Readings from Last 1 Encounters:  07/08/20 65.3 kg    BMI:  Body mass index is 23.96 kg/m.  Estimated Nutritional  Needs:   Kcal:  9485-4627  Protein:  85-100 grams  Fluid:  > 1.7 L  Clayton Bibles, MS, RD, LDN Inpatient Clinical Dietitian Contact information available via Amion

## 2020-07-13 NOTE — TOC Progression Note (Addendum)
Transition of Care [facility]) - Progression Note    Patient Details  Name: Mary Merritt MRN: [id number] Date of Birth: 11-17-1927  Transition of Care [facility]) CM/SW Contact  Timithy Arons, Juliann Pulse, RN Phone Number: 07/13/2020, 10:19 AM  Clinical Narrative: Faxed out await bed offers.  2:19p-dtr Vicky chose [facility]-initiated auth [id number] clinicals #[phone].     Expected Discharge Plan: [place] Barriers to Discharge: Continued Medical Work up  Expected Discharge Plan and Services Expected Discharge Plan: [place]   Discharge Planning Services: CM Consult   Living arrangements for the past 2 months: Single Family Home                                       Social Determinants of Health (SDOH) Interventions    Readmission Risk Interventions No flowsheet data found.

## 2020-07-13 NOTE — NC FL2 (Signed)
[place] LEVEL OF CARE SCREENING TOOL     IDENTIFICATION  Patient Name: Mary Merritt Birthdate: 06/03/1928 Sex: female Admission Date (Current Location): 07/07/2020  [facility] and [place] Number:  Herbalist and Address:  [facility],  [place] [address], [place]      Provider Number: [id number]  Attending Physician Name and Address:  Mercy Riding, MD  Relative Name and Phone Number:  Breck Coons [phone]    Current Level of Care: Hospital Recommended Level of Care: [place] Prior Approval Number:    Date Approved/Denied:   PASRR Number: [id number]  Discharge Plan: SNF    Current Diagnoses: Patient Active Problem List   Diagnosis Date Noted  . Pleural effusion 07/07/2020  . Small cell lung cancer ([place]) 06/05/2020  . Goals of care, counseling/discussion 06/05/2020  . Abnormal chest x-ray   . Pneumonia 05/14/2020  . Pain in right foot 11/17/2019  . Hyponatremia 11/22/2017  . Nondisplaced fracture of left tibial spine, subsequent encounter for closed fracture with routine healing 08/05/2017  . Chronic pain of left knee 07/21/2017  . Presence of left artificial hip joint 05/30/2016  . Hip joint replacement status 03/27/2016  . S/P hip replacement 07/27/2015  . Osteoarthritis of right hip 08/01/2014  . Essential hypertension 05/31/2014  . Mixed hyperlipidemia 05/31/2014  . S/P AAA repair 05/31/2014  . Bradycardia 05/31/2014  . Murmur 05/31/2014    Orientation RESPIRATION BLADDER Height & Weight     Self,Time,Situation  O2 Continent Weight: 65.3 kg Height:  5\' 5"  (165.1 cm)  BEHAVIORAL SYMPTOMS/MOOD NEUROLOGICAL BOWEL NUTRITION STATUS      Continent Diet (Heart Healthy)  AMBULATORY STATUS COMMUNICATION OF NEEDS Skin   Limited Assist Verbally PU Stage and Appropriate Care (sacrum-foam) PU Stage 1 Dressing: Daily                     Personal Care Assistance Level of Assistance   Bathing,Feeding,Dressing Bathing Assistance: Limited assistance Feeding assistance: Limited assistance Dressing Assistance: Limited assistance     Functional Limitations Info  Sight,Hearing,Speech Sight Info: Impaired (eyeglasses) Hearing Info: Impaired (has bilateral hearing aids but not using.) Speech Info: Adequate    SPECIAL CARE FACTORS FREQUENCY  PT (By licensed PT),OT (By licensed OT)     PT Frequency: 5x week OT Frequency: 5x week            Contractures Contractures Info: Not present    Additional Factors Info  Code Status,Allergies Code Status Info:  (Partial code) Allergies Info:  (Percocet (Oxycodone-acetaminophen)           Current Medications (07/13/2020):  This is the current hospital active medication list Current Facility-Administered Medications  Medication Dose Route Frequency Provider Last Rate Last Admin  . acetaminophen (TYLENOL) tablet 650 mg  650 mg Oral Q6H PRN Lovey Newcomer T, NP   650 mg at 07/13/20 0924  . amLODipine (NORVASC) tablet 10 mg  10 mg Oral Daily Elwyn Reach, MD   10 mg at 07/13/20 8250  . escitalopram (LEXAPRO) tablet 10 mg  10 mg Oral Daily Elwyn Reach, MD   10 mg at 07/13/20 0370  . feeding supplement (ENSURE ENLIVE / ENSURE PLUS) liquid 237 mL  237 mL Oral TID BM Hosie Poisson, MD   237 mL at 07/12/20 2029  . heparin injection 5,000 Units  5,000 Units Subcutaneous Q8H Hosie Poisson, MD   5,000 Units at 07/13/20 [phone]  .  losartan (COZAAR) tablet 50 mg  50 mg Oral QHS Elwyn Reach, MD   50 mg at 07/12/20 2122  . magnesium oxide (MAG-OX) tablet 200 mg  200 mg Oral QODAY Garba, Mohammad L, MD   200 mg at 07/12/20 1019  . metoprolol tartrate (LOPRESSOR) tablet 12.5 mg  12.5 mg Oral BID Gala Romney L, MD   12.5 mg at 07/13/20 0929  . multivitamin with minerals tablet 1 tablet  1 tablet Oral Daily Hosie Poisson, MD   1 tablet at 07/13/20 0921  . omega-3 acid ethyl esters (LOVAZA) capsule 2 g  2 g Oral Daily Elwyn Reach, MD   2 g at 07/12/20 2122  . ondansetron (ZOFRAN) tablet 4 mg  4 mg Oral Q6H PRN Elwyn Reach, MD       Or  . ondansetron (ZOFRAN) injection 4 mg  4 mg Intravenous Q6H PRN Gala Romney L, MD      . polyethylene glycol (MIRALAX / GLYCOLAX) packet 17 g  17 g Oral Daily Lang Snow, NP   17 g at 07/13/20 0919  . pravastatin (PRAVACHOL) tablet 20 mg  20 mg Oral q1800 Elwyn Reach, MD   20 mg at 07/12/20 1813  . traMADol (ULTRAM) tablet 50 mg  50 mg Oral Q8H PRN Hosie Poisson, MD   50 mg at 07/11/20 1439     Discharge Medications: Please see discharge summary for a list of discharge medications.  Relevant Imaging Results:  Relevant Lab Results:   Additional Information SS#[id number]. Radiation 5 days/week until 07/31/20-family will provide transportation.  Dessa Phi, RN

## 2020-07-14 ENCOUNTER — Inpatient Hospital Stay (HOSPITAL_COMMUNITY): Payer: Medicare Other

## 2020-07-14 ENCOUNTER — Ambulatory Visit
Admit: 2020-07-14 | Discharge: 2020-07-14 | Disposition: A | Discharge status: Home / Self Care | Payer: Medicare Other | Attending: Radiation Oncology | Admitting: Radiation Oncology

## 2020-07-14 ENCOUNTER — Other Ambulatory Visit (HOSPITAL_COMMUNITY): Payer: Self-pay | Admitting: Oncology

## 2020-07-14 DIAGNOSIS — R5381 Other malaise: Secondary | ICD-10-CM

## 2020-07-14 DIAGNOSIS — J910 Malignant pleural effusion: Secondary | ICD-10-CM

## 2020-07-14 DIAGNOSIS — J90 Pleural effusion, not elsewhere classified: Secondary | ICD-10-CM | POA: Diagnosis not present

## 2020-07-14 LAB — RENAL FUNCTION PANEL
Albumin: 2.6 g/dL — ABNORMAL LOW (ref 3.5–5.0)
Anion gap: 11 (ref 5–15)
BUN: 15 mg/dL (ref 8–23)
CO2: 30 mmol/L (ref 22–32)
Calcium: 9.1 mg/dL (ref 8.9–10.3)
Chloride: 89 mmol/L — ABNORMAL LOW (ref 98–111)
Creatinine, Ser: 0.69 mg/dL (ref 0.44–1.00)
GFR, Estimated: >60 mL/min (ref >60)
Glucose, Bld: 121 mg/dL — ABNORMAL HIGH (ref 70–99)
Phosphorus: 2.9 mg/dL (ref 2.5–4.6)
Potassium: 4.5 mmol/L (ref 3.5–5.1)
Sodium: 130 mmol/L — ABNORMAL LOW (ref 135–145)

## 2020-07-14 LAB — MAGNESIUM: Magnesium: 1.9 mg/dL (ref 1.7–2.4)

## 2020-07-14 LAB — SARS CORONAVIRUS 2 (TAT 6-24 HRS): SARS Coronavirus 2: NEGATIVE

## 2020-07-14 MED ORDER — SODIUM CHLORIDE 1 G PO TABS: 1.0000 g | ORAL_TABLET | Freq: Two times a day (BID) | ORAL | 0 refills | Status: DC

## 2020-07-14 MED ORDER — POLYETHYLENE GLYCOL 3350 17 G PO PACK: 17.0000 g | PACK | Freq: Two times a day (BID) | ORAL | 0 refills | Status: AC | PRN

## 2020-07-14 MED ORDER — ACETAMINOPHEN 500 MG PO TABS: 500.0000 mg | ORAL_TABLET | Freq: Three times a day (TID) | ORAL | Status: AC

## 2020-07-14 MED ORDER — TRAMADOL HCL 50 MG PO TABS: 50.0000 mg | ORAL_TABLET | Freq: Two times a day (BID) | ORAL | 0 refills | Status: AC | PRN

## 2020-07-14 MED ORDER — ENSURE ENLIVE PO LIQD: 237.0000 mL | Freq: Three times a day (TID) | ORAL | 12 refills | Status: AC

## 2020-07-14 MED ORDER — ADULT MULTIVITAMIN W/MINERALS CH: 1.0000 | ORAL_TABLET | Freq: Every day | ORAL | Status: DC

## 2020-07-14 NOTE — TOC Progression Note (Addendum)
Transition of Care [facility]) - Progression Note    Patient Details  Name: Mary Merritt MRN: [id number] Date of Birth: 1927-12-20  Transition of Care [facility]) CM/SW Contact  Waldemar Siegel, Juliann Pulse, RN Phone Number: 07/14/2020, 10:34 AM  Clinical Narrative:Awaiting covid results, & d/c summary for d/c to [facility]-SNF.Received auth for 5 days [id number]/QGBE#[id number].NRD 1/25.xrt schedule faxed to [facility] rep Janie with confirmation.Will arrange PTAR once d/c summary completed, & covid results received. Going to rm#[id number], nsg report tel#[phone].  2:36p-[facility] rep Narda Rutherford can only accept patient until 8p for admission.  3:53p-TC  safe ride transportation([phone]) spoke to [facility] can call them for transport-provide demographic info,stretcher,dnr,5'5",65.3kg,covid results,going to [facility] rm#[id number] to call Safe Ride once covid results back-MD/Nsg aware. 4:57p-Covid results neg. Vicky dtr has agreed to transport patient to [facility];Vicky will also be transporting her for xrt treatments-she feels comfortable. MD/Nsg updated.[facility] ready to receive patient before 8p.No further CM needs.    Expected Discharge Plan: [place] Barriers to Discharge: Continued Medical Work up  Expected Discharge Plan and Services Expected Discharge Plan: [place]   Discharge Planning Services: CM Consult   Living arrangements for the past 2 months: Single Family Home Expected Discharge Date: 07/14/20                                     Social Determinants of Health (SDOH) Interventions    Readmission Risk Interventions No flowsheet data found.

## 2020-07-14 NOTE — Progress Notes (Signed)
SATURATION QUALIFICATIONS: (This note is used to comply with regulatory documentation for home oxygen)  Patient Saturations on Room Air at Rest = 95%  Patient Saturations on Room Air while Ambulating = 90%   

## 2020-07-14 NOTE — Plan of Care (Signed)
  Problem: Coping: Goal: Level of anxiety will decrease Outcome: Adequate for Discharge   Problem: Activity: Goal: Risk for activity intolerance will decrease Outcome: Adequate for Discharge

## 2020-07-14 NOTE — Progress Notes (Signed)
Notified SNF of patient DC and that family would be transporting her there, was transferred to unit to give report and was sent to VM, called immediately  back and was told that no one was available at the desk but patient can proceed to facility, personal belongings with patient

## 2020-07-14 NOTE — Discharge Summary (Signed)
Physician Discharge Summary  Mary Merritt JJO:[id number] DOB: 05/22/1928 DOA: 07/07/2020  PCP: Katherina Mires, MD  Admit date: 07/07/2020 Discharge date: 07/14/2020  Admitted From: Home Disposition: SNF  Recommendations for Outpatient Follow-up:  1. Follow up with PCP at SNF in 1 week 2. Oncology to arrange outpatient follow-up 3. Please obtain CBC/BMP/Mag at follow up 4. Please follow up on the following pending results: None   Discharge Condition: Stable CODE STATUS: DNR   Contact information for after-discharge care    Destination    [facility] Preferred SNF .   Service: Skilled Nursing Contact information: [address] [place] [phone]                 Hospital Course: 87 year old female with PMH of stage IV lung cancer, recurrent malignant pleural effusion, AAA, HTN and HLD who was directed to ED from oncology clinic due to worsening dyspnea.  CT chest showed large tumor in right lung with mass-effect and surrounding pleural fluid.  He was admitted for acute hypoxemic respiratory failure due to recurrent malignant pleural effusion in the setting of stage IV lung cancer. She underwent US-guided core with removal of 1.1 L pleural fluid.  Radiation oncology recommended 3 weeks of palliative radiation therapy, that has started on 07/12/2020. Repeat chest x-ray on 07/14/2020 showed without significant change from prior.  Patient maintained saturation above 90% with ambulation on room air on the day of discharge.  Therapy recommended SNF.   See individual problem list below for more hospital course.   Discharge Diagnoses:  Acute respiratory failure with hypoxia due to recurrent malignant pleural effusion and worsening of metastatic lung cancer with mass effect on right bronchus. Underwent thoracocentesis with removal of 1.1 L pleural effusion on 1/17.  Fluid was not sent for study.   Repeat chest x-ray without  significant change.  Cleared for discharge by oncology.  Maintain saturation above 90% with ambulation on the day of discharge. -Started radiation therapy on 07/12/2020 -Continue encouraging incentive spirometry -Continue PT/OT -Follow-up with oncology and radiation oncology  Essential hypertension: Normotensive -Discontinued amlodipine and losartan -Continue home metoprolol  Hyponatremia secondary to SIADH. Na 128> 130> 128> 130 -Continue p.o. sodium chloride 1 g twice daily. -Recheck BMP in 1 week  Hypomagnesemia: Replenished and resolved -Recheck in 1 week  Generalized weakness/physical deconditioning -Continue PT/OT at SNF  Hyperlipidemia: On pravastatin.  Doubt utility of this as primary prevention.  Cancer related pain: -Continue scheduled Tylenol and as needed tramadol.   Body mass index is 23.96 kg/m. Nutrition Problem: Increased nutrient needs Etiology: chronic illness (metastatic lung cancer) Signs/Symptoms: estimated needs Interventions: Ensure Enlive (each supplement provides 350kcal and 20 grams of protein),MVI      Discharge Exam: Vitals:   07/13/20 2017 07/14/20 0600  BP: 123/62 120/70  Pulse: 76 90  Resp: 18 17  Temp: 98 F (36.7 C) 98.9 F (37.2 C)  SpO2: 96% 96%    GENERAL: No apparent distress.  Nontoxic. HEENT: MMM.  Vision and hearing grossly intact.  NECK: Supple.  No apparent JVD.  RESP: 90% with ambulation on RA. no IWOB.  Diminished aeration over lower lung fields CVS:  RRR. Heart sounds normal.  ABD/GI/GU: Bowel sounds present. Soft. Non tender.  MSK/EXT:  Moves extremities. No apparent deformity. No edema.  SKIN: no apparent skin lesion or wound NEURO: Awake, alert and oriented appropriately.  No apparent focal neuro deficit. PSYCH: Calm. Normal affect.  Discharge Instructions  Discharge Instructions  Diet general   Complete by: As directed    Increase activity slowly   Complete by: As directed      Allergies as of  07/14/2020      Reactions   Percocet [oxycodone-acetaminophen] Other (See Comments)   Makes head feel funny      Medication List    STOP taking these medications   amLODipine 10 MG tablet Commonly known as: NORVASC   losartan 50 MG tablet Commonly known as: COZAAR   lovastatin 20 MG tablet Commonly known as: MEVACOR   melatonin 3 MG Tabs tablet     TAKE these medications   acetaminophen 500 MG tablet Commonly known as: TYLENOL Take 1 tablet (500 mg total) by mouth every 8 (eight) hours.   aspirin EC 81 MG tablet Take 81 mg by mouth daily.   escitalopram 10 MG tablet Commonly known as: LEXAPRO Take 10 mg by mouth daily.   feeding supplement Liqd Take 237 mLs by mouth 3 (three) times daily between meals.   Fish Oil 1000 MG Caps Take 2,000 mg by mouth at bedtime.   Magnesium 250 MG Tabs Take 250 mg by mouth every other day.   metoprolol tartrate 25 MG tablet Commonly known as: LOPRESSOR Take 0.5 tablets (12.5 mg total) by mouth 2 (two) times daily.   multivitamin with minerals Tabs tablet Take 1 tablet by mouth daily. Start taking on: July 15, 2020   ondansetron 8 MG tablet Commonly known as: ZOFRAN Take 1 tablet (8 mg total) by mouth every 8 (eight) hours as needed for nausea or vomiting. Begin 72 hours after day 1 IV infusion.   polyethylene glycol 17 g packet Commonly known as: MIRALAX / GLYCOLAX Take 17 g by mouth 2 (two) times daily as needed.   prochlorperazine 5 MG tablet Commonly known as: COMPAZINE Take 1 tablet (5 mg total) by mouth every 6 (six) hours as needed for nausea or vomiting.   sodium chloride 1 g tablet Take 1 tablet (1 g total) by mouth 2 (two) times daily with a meal.   traMADol 50 MG tablet Commonly known as: ULTRAM Take 1 tablet (50 mg total) by mouth every 12 (twelve) hours as needed for up to 7 days for moderate pain.   TURMERIC CURCUMIN PO Take 1 tablet by mouth daily.       Consultations:  Oncology  Radiation  oncology  Interventional radiology  Procedures/Studies:  1/17-right-sided US-guided thoracocentesis with removal of 1.1 L   DG Chest 1 View  Result Date: 07/10/2020 CLINICAL DATA:  Post right-sided thoracentesis EXAM: CHEST  1 VIEW COMPARISON:  07/09/2020; 07/07/2020; chest CT-07/07/2020; 05/15/2020 FINDINGS: Interval reduction in persistent small likely partially loculated right-sided effusion post thoracentesis. No pneumothorax. Improved aeration of the right lung with persistent right basilar consolidative opacities and upper lobe consolidative opacities. Unchanged cardiac silhouette and mediastinal contours with consolidative opacities about the right hilum. Slight worsening of left basilar heterogeneous opacities favored to represent atelectasis. No left-sided pleural effusion or pneumothorax. No definite evidence of edema. No acute osseous abnormalities. Old/healed left-sided posterolateral rib fractures IMPRESSION: 1. Interval reduction in persistent small likely partially loculated right-sided effusion post thoracentesis. No pneumothorax. 2. Improved aeration of the right lung with otherwise similar findings known advanced pulmonary malignancy. Electronically Signed   By: Sandi Mariscal M.D.   On: 07/10/2020 11:11   DG Chest 1 View  Result Date: 06/21/2020 CLINICAL DATA:  Status post thoracentesis EXAM: CHEST  1 VIEW COMPARISON:  June 14, 2020 chest  radiograph and chest CT May 16, 2019 FINDINGS: No pneumothorax. Small residual pleural effusion on the right. There is persistent airspace opacity throughout the right upper lobe. Patchy areas of airspace opacity noted in the left mid lung and left base. Heart size and pulmonary vascular normal. No adenopathy. There is aortic atherosclerosis. No bone lesions. IMPRESSION: No pneumothorax. Airspace opacity persists in the right upper lobe. The known mass in the right upper lobe is not well delineated given the surrounding apparent pneumonitis.  Small residual right pleural effusion with right base atelectasis. New ill-defined airspace opacity left mid lung and left base may represent foci of developing pneumonia. Stable cardiac silhouette. Aortic Atherosclerosis (ICD10-I70.0). Electronically Signed   By: Lowella Grip III M.D.   On: 06/21/2020 10:59   DG Chest 2 View  Result Date: 07/14/2020 CLINICAL DATA:  Lung carcinoma with pleural effusion EXAM: CHEST - 2 VIEW COMPARISON:  July 10, 2020 chest radiograph. Chest CT July 07, 2020 FINDINGS: There is a right pleural effusion which appears partially loculated, similar to most recent study. There is extensive airspace opacity in the right upper lobe with right upper lobe bronchial obstruction, better delineated on recent CT, presumably secondary to mass in this area. Opacity along the left lateral paratracheal region and adjacent to the aortic arch is apparently due to adenopathy, better delineated on CT. There is no edema or consolidation on the left. The heart size and pulmonary vascularity are within normal limits. There is aortic atherosclerosis. No bone lesions. IMPRESSION: Persistent and similar partially loculated right pleural effusion with right base atelectasis. Large apparent mass and consolidation right upper lobe with obstruction of the right upper lobe bronchus, better delineated on recent CT. Apparent adenopathy adjacent to the aortic arch and proximal descending aorta on the left as well as along the left paratracheal region, better delineated on recent CT. Left lung otherwise clear. Stable cardiac silhouette. Aortic Atherosclerosis (ICD10-I70.0). Electronically Signed   By: Lowella Grip III M.D.   On: 07/14/2020 10:20   DG Chest 2 View  Result Date: 07/07/2020 CLINICAL DATA:  Increasing shortness of breath for 2 weeks. Known right lung cancer. EXAM: CHEST - 2 VIEW COMPARISON:  Radiographs 07/05/2020 and 07/01/2020.  CT 05/15/2020. FINDINGS: The heart size and  mediastinal contours are stable. There is aortic atherosclerosis. Right suprahilar mass, partial right lung collapse and moderate-sized right pleural effusion have not significantly changed. Known peripheral left upper lobe nodule is not well visualized. There is no pneumothorax or acute osseous abnormality. IMPRESSION: No significant change in right suprahilar mass, partial right lung collapse and moderate right pleural effusion. Electronically Signed   By: Richardean Sale M.D.   On: 07/07/2020 16:26   DG Chest 2 View  Result Date: 07/05/2020 CLINICAL DATA:  87 year old female with pleural effusion. EXAM: CHEST - 2 VIEW COMPARISON:  Chest radiograph dated 07/01/2020. FINDINGS: Slight interval increase in the right pleural effusion since the prior radiograph. There is diffuse right lung opacity. Left lung base atelectasis. No pneumothorax. Stable cardiomediastinal silhouette. No acute osseous pathology. IMPRESSION: Slight interval increase in the right pleural effusion since the prior radiograph. Electronically Signed   By: Anner Crete M.D.   On: 07/05/2020 16:24   DG Chest 2 View  Result Date: 07/01/2020 CLINICAL DATA:  Shortness of breath. History of pleural effusion and thoracentesis. EXAM: CHEST - 2 VIEW COMPARISON:  Chest x-ray dated 06/21/2020. FINDINGS: Reaccumulating RIGHT pleural effusion, moderate in size. Stable chronic airspace opacity within the RIGHT upper lobe, better  demonstrated as a mass on chest CT of 05/15/2020. No new findings on the LEFT. Heart size and mediastinal contours appear stable. IMPRESSION: 1. Reaccumulating RIGHT pleural effusion, moderate in size. 2. Stable chronic airspace opacity within the RIGHT upper lobe, better demonstrated as a mass on chest CT of 05/15/2020. Electronically Signed   By: Franki Cabot M.D.   On: 07/01/2020 15:15   CT Angio Chest PE W and/or Wo Contrast  Result Date: 07/07/2020 CLINICAL DATA:  Chest pain or shortness of breath.  Lung masses  EXAM: CT ANGIOGRAPHY CHEST WITH CONTRAST TECHNIQUE: Multidetector CT imaging of the chest was performed using the standard protocol during bolus administration of intravenous contrast. Multiplanar CT image reconstructions and MIPs were obtained to evaluate the vascular anatomy. CONTRAST:  76mL OMNIPAQUE IOHEXOL 350 MG/ML SOLN COMPARISON:  Chest CT 05/15/2020 FINDINGS: Cardiovascular: Contrast injection is sufficient to demonstrate satisfactory opacification of the pulmonary arteries to the segmental level. There is no pulmonary embolus or evidence of right heart strain. The size of the main pulmonary artery is normal. Heart size is normal, with no pericardial effusion. The course and caliber of the aorta are normal. There is mild atherosclerosis. No acute finding. Mediastinum/Nodes: Unchanged appearance of large prevascular node measuring 2.5 cm. Lungs/Pleura: Large tumor in the right lung with surrounding pleural fluid. There is pleural nodularity along the right minor fissure. Multiple nodular opacities at the right lower lobe. Tumor surrounds the right mainstem bronchus and severely narrows the right upper lobe bronchus. The right pulmonary artery is also encased. Tumor size is increased. Upper Abdomen: Contrast bolus timing is not optimized for evaluation of the abdominal organs. The visualized portions of the organs of the upper abdomen are normal. Musculoskeletal: No chest wall abnormality. No bony spinal canal stenosis. Review of the MIP images confirms the above findings. IMPRESSION: 1. No pulmonary embolus or acute aortic syndrome. 2. 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 Tumor surrounds the right mainstem bronchus and severely narrows the right upper lobe bronchus. The right pulmonary artery is also encased. 3. Multiple nodular opacities at the right lung base, concerning for metastatic disease versus areas of consolidation. 4. Unchanged appearance of large prevascular node. Aortic  Atherosclerosis (ICD10-I70.0). Electronically Signed   By: Ulyses Jarred M.D.   On: 07/07/2020 21:46   DG CHEST PORT 1 VIEW  Result Date: 07/09/2020 CLINICAL DATA:  Pleural effusion shortness of breath. EXAM: PORTABLE CHEST 1 VIEW COMPARISON:  07/07/2020 FINDINGS: Lungs are adequately inflated demonstrate no change in patient's known masslike density over the right suprahilar region. There is interval worsening of a moderate size right pleural effusion. Likely associated atelectasis in the right base. Left lung is clear. Cardiomediastinal silhouette and remainder of the exam is unchanged. IMPRESSION: 1. Interval worsening moderate size right pleural effusion likely with associated atelectasis right base. 2. Stable masslike density over the right suprahilar region. Electronically Signed   By: Marin Olp M.D.   On: 07/09/2020 14:27   DG Chest Port 1 View  Result Date: 07/01/2020 CLINICAL DATA:  Post right-sided thoracentesis. EXAM: PORTABLE CHEST 1 VIEW COMPARISON:  Radiograph 07/01/2020, CT 05/15/2020 FINDINGS: Slight decreased right pleural effusion after thoracentesis. No visualized pneumothorax or pleural line. Opacity in the right lung most prominent in the suprahilar region is unchanged from earlier today. Stable heart size and mediastinal contours. Aortic atherosclerosis. Mild atelectasis at the left lung base. Lingular nodule on CT is not well seen by radiograph. Bones are diffusely under mineralized. Remote left  rib fractures. IMPRESSION: 1. Slight decreased right pleural effusion after thoracentesis. No visualized pneumothorax. 2. Unchanged right upper lung zone opacity, right upper lobe mass on prior CT. Electronically Signed   By: Keith Rake M.D.   On: 07/01/2020 19:39   DG ABD ACUTE 2+V W 1V CHEST  Result Date: 06/16/2020 CLINICAL DATA:  Constipation.  History of lung cancer. EXAM: DG ABDOMEN ACUTE WITH 1 VIEW CHEST COMPARISON:  Chest x-ray dated 06/14/2020 FINDINGS: Diffuse airspace  opacification is again noted throughout the right lung field. There is a right-sided pleural effusion. There is right-sided volume loss. Aortic calcifications are noted. The heart size is stable. There is no pneumothorax. There is a large amount of stool throughout the colon. The bowel gas pattern is nonobstructive. There are degenerative changes throughout the lumbar spine. The patient is status post prior total hip arthroplasty bilaterally. IMPRESSION: 1. Nonobstructive bowel gas pattern. 2. Large amount of stool throughout the colon. 3. Stable appearance of the chest. Electronically Signed   By: Constance Holster M.D.   On: 06/16/2020 18:35   US THORACENTESIS ASP PLEURAL SPACE W/IMG GUIDE  Result Date: 07/10/2020 INDICATION: Patient with history of small cell lung cancer, dyspnea, recurrent right pleural effusion. Request received for therapeutic right thoracentesis. EXAM: ULTRASOUND GUIDED THERAPEUTIC RIGHT THORACENTESIS MEDICATIONS: 1% lidocaine to skin and subcutaneous tissue COMPLICATIONS: None immediate. PROCEDURE: An ultrasound guided thoracentesis was thoroughly discussed with the patient and questions answered. The benefits, risks, alternatives and complications were also discussed. The patient understands and wishes to proceed with the procedure. Written consent was obtained. Ultrasound was performed to localize and mark an adequate pocket of fluid in the right chest. The area was then prepped and draped in the normal sterile fashion. 1% Lidocaine was used for local anesthesia. Under ultrasound guidance a 6 Fr Safe-T-Centesis catheter was introduced. Thoracentesis was performed. The catheter was removed and a dressing applied. FINDINGS: A total of approximately 1.1 liters of amber fluid was removed. Due to patient coughing only the above amount of fluid was removed today. IMPRESSION: Successful ultrasound guided therapeutic right thoracentesis yielding 1.1 liters of pleural fluid. Read by: Rowe Robert, PA-C Electronically Signed   By: Sandi Mariscal M.D.   On: 07/10/2020 10:55   US Thoracentesis Asp Pleural space w/IMG guide  Result Date: 06/21/2020 INDICATION: Patient with a history of lung cancer and recurrent right pleural effusion. Interventional radiology asked to perform a therapeutic thoracentesis. EXAM: ULTRASOUND GUIDED THORACENTESIS MEDICATIONS: 1% lidocaine 10 mL COMPLICATIONS: None immediate. PROCEDURE: An ultrasound guided thoracentesis was thoroughly discussed with the patient and questions answered. The benefits, risks, alternatives and complications were also discussed. The patient understands and wishes to proceed with the procedure. Written consent was obtained. Ultrasound was performed to localize and mark an adequate pocket of fluid in the right chest. The area was then prepped and draped in the normal sterile fashion. 1% Lidocaine was used for local anesthesia. Under ultrasound guidance a 6 Fr Safe-T-Centesis catheter was introduced. Thoracentesis was performed. The catheter was removed and a dressing applied. FINDINGS: A total of approximately 1 L of amber-colored fluid was removed. IMPRESSION: Successful ultrasound guided right thoracentesis yielding 1 L of pleural fluid. Read by: Soyla Dryer, NP Electronically Signed   By: Jerilynn Mages.  Shick M.D.   On: 06/21/2020 11:13        The results of significant diagnostics from this hospitalization (including imaging, microbiology, ancillary and laboratory) are listed below for reference.     Microbiology: Recent Results (from  the past 240 hour(s))  Resp Panel by RT-PCR (Flu A&B, Covid) Nasopharyngeal Swab     Status: None   Collection Time: 07/07/20  9:25 PM   Specimen: Nasopharyngeal Swab; Nasopharyngeal(NP) swabs in vial transport medium  Result Value Ref Range Status   SARS Coronavirus 2 by RT PCR NEGATIVE NEGATIVE Final    Comment: (NOTE) SARS-CoV-2 target nucleic acids are NOT DETECTED.  The SARS-CoV-2 RNA is generally  detectable in upper respiratory specimens during the acute phase of infection. The lowest concentration of SARS-CoV-2 viral copies this assay can detect is 138 copies/mL. A negative result does not preclude SARS-Cov-2 infection and should not be used as the sole basis for treatment or other patient management decisions. A negative result may occur with  improper specimen collection/handling, submission of specimen other than nasopharyngeal swab, presence of viral mutation(s) within the areas targeted by this assay, and inadequate number of viral copies(<138 copies/mL). A negative result must be combined with clinical observations, patient history, and epidemiological information. The expected result is Negative.  Fact Sheet for Patients:  EntrepreneurPulse.com.au  Fact Sheet for Healthcare Providers:  IncredibleEmployment.be  This test is no t yet approved or cleared by the [place] FDA and  has been authorized for detection and/or diagnosis of SARS-CoV-2 by FDA under an Emergency Use Authorization (EUA). This EUA will remain  in effect (meaning this test can be used) for the duration of the COVID-19 declaration under Section 564(b)(1) of the Act, 21 U.S.C.section 360bbb-3(b)(1), unless the authorization is terminated  or revoked sooner.       Influenza A by PCR NEGATIVE NEGATIVE Final   Influenza B by PCR NEGATIVE NEGATIVE Final    Comment: (NOTE) The Xpert Xpress SARS-CoV-2/FLU/RSV plus assay is intended as an aid in the diagnosis of influenza from Nasopharyngeal swab specimens and should not be used as a sole basis for treatment. Nasal washings and aspirates are unacceptable for Xpert Xpress SARS-CoV-2/FLU/RSV testing.  Fact Sheet for Patients: EntrepreneurPulse.com.au  Fact Sheet for Healthcare Providers: IncredibleEmployment.be  This test is not yet approved or cleared by the [place] FDA  and has been authorized for detection and/or diagnosis of SARS-CoV-2 by FDA under an Emergency Use Authorization (EUA). This EUA will remain in effect (meaning this test can be used) for the duration of the COVID-19 declaration under Section 564(b)(1) of the Act, 21 U.S.C. section 360bbb-3(b)(1), unless the authorization is terminated or revoked.  Performed at [facility], [place] [address], [place], [place] [address]      Labs: BNP (last 3 results) No results for input(s): BNP in the last 8760 hours. Basic Metabolic Panel: Recent Labs  Lab 07/08/20 0423 07/09/20 1102 07/11/20 0717 07/13/20 0552 07/14/20 0521  NA 128* 129* 130* 128* 130*  K 3.9 3.8 4.2 4.0 4.5  CL 93* 91* 90* 88* 89*  CO2 24 31 29 29 30   GLUCOSE 113* 210* 122* 122* 121*  BUN 13 11 15 15 15   CREATININE 0.58 0.76 0.72 0.59 0.69  CALCIUM 8.9 8.8* 9.2 9.0 9.1  MG  --   --   --  1.5* 1.9  PHOS  --   --   --  3.0 2.9   Liver Function Tests: Recent Labs  Lab 07/07/20 1812 07/08/20 0423 07/13/20 0552 07/14/20 0521  AST 17 16  --   --   ALT 14 13  --   --   ALKPHOS 65 63  --   --   BILITOT 0.5 0.5  --   --  PROT 6.4* 6.0*  --   --   ALBUMIN 2.9* 2.7* 2.8* 2.6*   No results for input(s): LIPASE, AMYLASE in the last 168 hours. No results for input(s): AMMONIA in the last 168 hours. CBC: Recent Labs  Lab 07/07/20 1812 07/08/20 0423 07/11/20 0717  WBC 9.5 9.5 8.2  NEUTROABS 7.5  --  6.6  HGB 12.7 12.0 12.3  HCT 37.7 36.2 37.3  MCV 86.7 86.0 87.8  PLT 466* 410* 395   Cardiac Enzymes: No results for input(s): CKTOTAL, CKMB, CKMBINDEX, TROPONINI in the last 168 hours. BNP: Invalid input(s): POCBNP CBG: No results for input(s): GLUCAP in the last 168 hours. D-Dimer No results for input(s): DDIMER in the last 72 hours. Hgb A1c No results for input(s): HGBA1C in the last 72 hours. Lipid Profile No results for input(s): CHOL, HDL, LDLCALC, TRIG, CHOLHDL, LDLDIRECT in the last  72 hours. Thyroid function studies No results for input(s): TSH, T4TOTAL, T3FREE, THYROIDAB in the last 72 hours.  Invalid input(s): FREET3 Anemia work up No results for input(s): VITAMINB12, FOLATE, FERRITIN, TIBC, IRON, RETICCTPCT in the last 72 hours. Urinalysis    Component Value Date/Time   COLORURINE YELLOW 12/23/2018 2140   APPEARANCEUR HAZY (A) 12/23/2018 2140   LABSPEC 1.013 12/23/2018 2140   PHURINE 6.0 12/23/2018 2140   GLUCOSEU NEGATIVE 12/23/2018 2140   HGBUR SMALL (A) 12/23/2018 2140   BILIRUBINUR NEGATIVE 12/23/2018 2140   KETONESUR NEGATIVE 12/23/2018 2140   PROTEINUR NEGATIVE 12/23/2018 2140   UROBILINOGEN 0.2 07/21/2014 1356   NITRITE POSITIVE (A) 12/23/2018 2140   LEUKOCYTESUR MODERATE (A) 12/23/2018 2140   Sepsis Labs Invalid input(s): PROCALCITONIN,  WBC,  LACTICIDVEN   Time coordinating discharge: 35 minutes  SIGNED:  Mercy Riding, MD  [facility] 07/14/2020, 10:52 AM  If 7PM-7AM, please contact night-coverage [email or web address]

## 2020-07-14 NOTE — Plan of Care (Signed)
Pt alert, no s/s of respiratory distress thru the night. Pt remains on 3L [place] O2 sat 96%.  Problem: Clinical Measurements: Goal: Respiratory complications will improve Outcome: Progressing   Problem: Activity: Goal: Risk for activity intolerance will decrease Outcome: Progressing   Problem: Health Behavior/Discharge Planning: Goal: Ability to manage health-related needs will improve Outcome: Progressing

## 2020-07-14 NOTE — Care Management Important Message (Signed)
Important Message  Patient Details IM Letter placed in Patients room Name: LAKEASHA PETION MRN: [id number] Date of Birth: [birth year]   Medicare Important Message Given:  Yes     Kerin Salen 07/14/2020, 11:06 AM

## 2020-07-14 NOTE — Progress Notes (Addendum)
HEMATOLOGY-ONCOLOGY PROGRESS NOTE  SUBJECTIVE: Mary Merritt continues on radiation.  No new complaints this morning.  Planning to discharge to SNF for rehab.  Bed available today.  Oncology History  Small cell lung cancer ([place])  06/05/2020 Initial Diagnosis   Small cell lung cancer ([place])   06/14/2020 -  Chemotherapy    Patient is on Treatment Plan: LUNG SCLC CARBOPLATIN + ETOPOSIDE + ATEZOLIZUMAB INDUCTION Q21D / ATEZOLIZUMAB MAINTENANCE Q21D       PHYSICAL EXAMINATION:  Vitals:   07/13/20 2017 07/14/20 0600  BP: 123/62 120/70  Pulse: 76 90  Resp: 18 17  Temp: 98 F (36.7 C) 98.9 F (37.2 C)  SpO2: 96% 96%   Filed Weights   07/08/20 1809  Weight: 65.3 kg    Intake/Output from previous day: 01/20 0701 - 01/21 0700 In: 600 [P.O.:600] Out: -   GENERAL:alert, no distress and comfortable OROPHARYNX: No thrush or mucositis LUNGS: Diminished right base HEART: regular rate & rhythm and no murmurs and no lower extremity edema NEURO: alert & oriented x 3 with fluent speech, no focal motor/sensory deficits  LABORATORY DATA:  I have reviewed the data as listed CMP Latest Ref Rng & Units 07/14/2020 07/13/2020 07/11/2020  Glucose 70 - 99 mg/dL 121(H) 122(H) 122(H)  BUN 8 - 23 mg/dL 15 15 15   Creatinine 0.44 - 1.00 mg/dL 0.69 0.59 0.72  Sodium 135 - 145 mmol/L 130(L) 128(L) 130(L)  Potassium 3.5 - 5.1 mmol/L 4.5 4.0 4.2  Chloride 98 - 111 mmol/L 89(L) 88(L) 90(L)  CO2 22 - 32 mmol/L 30 29 29   Calcium 8.9 - 10.3 mg/dL 9.1 9.0 9.2  Total Protein 6.5 - 8.1 g/dL - - -  Total Bilirubin 0.3 - 1.2 mg/dL - - -  Alkaline Phos 38 - 126 U/L - - -  AST 15 - 41 U/L - - -  ALT 0 - 44 U/L - - -    Lab Results  Component Value Date   WBC 8.2 07/11/2020   HGB 12.3 07/11/2020   HCT 37.3 07/11/2020   MCV 87.8 07/11/2020   PLT 395 07/11/2020   NEUTROABS 6.6 07/11/2020    DG Chest 1 View  Result Date: 07/10/2020 CLINICAL DATA:  Post right-sided thoracentesis EXAM: CHEST  1 VIEW  COMPARISON:  07/09/2020; 07/07/2020; chest CT-07/07/2020; 05/15/2020 FINDINGS: Interval reduction in persistent small likely partially loculated right-sided effusion post thoracentesis. No pneumothorax. Improved aeration of the right lung with persistent right basilar consolidative opacities and upper lobe consolidative opacities. Unchanged cardiac silhouette and mediastinal contours with consolidative opacities about the right hilum. Slight worsening of left basilar heterogeneous opacities favored to represent atelectasis. No left-sided pleural effusion or pneumothorax. No definite evidence of edema. No acute osseous abnormalities. Old/healed left-sided posterolateral rib fractures IMPRESSION: 1. Interval reduction in persistent small likely partially loculated right-sided effusion post thoracentesis. No pneumothorax. 2. Improved aeration of the right lung with otherwise similar findings known advanced pulmonary malignancy. Electronically Signed   By: Sandi Mariscal M.D.   On: 07/10/2020 11:11   DG Chest 1 View  Result Date: 06/21/2020 CLINICAL DATA:  Status post thoracentesis EXAM: CHEST  1 VIEW COMPARISON:  June 14, 2020 chest radiograph and chest CT May 16, 2019 FINDINGS: No pneumothorax. Small residual pleural effusion on the right. There is persistent airspace opacity throughout the right upper lobe. Patchy areas of airspace opacity noted in the left mid lung and left base. Heart size and pulmonary vascular normal. No adenopathy. There is aortic atherosclerosis. No bone  lesions. IMPRESSION: No pneumothorax. Airspace opacity persists in the right upper lobe. The known mass in the right upper lobe is not well delineated given the surrounding apparent pneumonitis. Small residual right pleural effusion with right base atelectasis. New ill-defined airspace opacity left mid lung and left base may represent foci of developing pneumonia. Stable cardiac silhouette. Aortic Atherosclerosis (ICD10-I70.0).  Electronically Signed   By: Lowella Grip III M.D.   On: 06/21/2020 10:59   DG Chest 2 View  Result Date: 07/14/2020 CLINICAL DATA:  Lung carcinoma with pleural effusion EXAM: CHEST - 2 VIEW COMPARISON:  July 10, 2020 chest radiograph. Chest CT July 07, 2020 FINDINGS: There is a right pleural effusion which appears partially loculated, similar to most recent study. There is extensive airspace opacity in the right upper lobe with right upper lobe bronchial obstruction, better delineated on recent CT, presumably secondary to mass in this area. Opacity along the left lateral paratracheal region and adjacent to the aortic arch is apparently due to adenopathy, better delineated on CT. There is no edema or consolidation on the left. The heart size and pulmonary vascularity are within normal limits. There is aortic atherosclerosis. No bone lesions. IMPRESSION: Persistent and similar partially loculated right pleural effusion with right base atelectasis. Large apparent mass and consolidation right upper lobe with obstruction of the right upper lobe bronchus, better delineated on recent CT. Apparent adenopathy adjacent to the aortic arch and proximal descending aorta on the left as well as along the left paratracheal region, better delineated on recent CT. Left lung otherwise clear. Stable cardiac silhouette. Aortic Atherosclerosis (ICD10-I70.0). Electronically Signed   By: Lowella Grip III M.D.   On: 07/14/2020 10:20   DG Chest 2 View  Result Date: 07/07/2020 CLINICAL DATA:  Increasing shortness of breath for 2 weeks. Known right lung cancer. EXAM: CHEST - 2 VIEW COMPARISON:  Radiographs 07/05/2020 and 07/01/2020.  CT 05/15/2020. FINDINGS: The heart size and mediastinal contours are stable. There is aortic atherosclerosis. Right suprahilar mass, partial right lung collapse and moderate-sized right pleural effusion have not significantly changed. Known peripheral left upper lobe nodule is not well  visualized. There is no pneumothorax or acute osseous abnormality. IMPRESSION: No significant change in right suprahilar mass, partial right lung collapse and moderate right pleural effusion. Electronically Signed   By: Richardean Sale M.D.   On: 07/07/2020 16:26   DG Chest 2 View  Result Date: 07/05/2020 CLINICAL DATA:  87 year old female with pleural effusion. EXAM: CHEST - 2 VIEW COMPARISON:  Chest radiograph dated 07/01/2020. FINDINGS: Slight interval increase in the right pleural effusion since the prior radiograph. There is diffuse right lung opacity. Left lung base atelectasis. No pneumothorax. Stable cardiomediastinal silhouette. No acute osseous pathology. IMPRESSION: Slight interval increase in the right pleural effusion since the prior radiograph. Electronically Signed   By: Anner Crete M.D.   On: 07/05/2020 16:24   DG Chest 2 View  Result Date: 07/01/2020 CLINICAL DATA:  Shortness of breath. History of pleural effusion and thoracentesis. EXAM: CHEST - 2 VIEW COMPARISON:  Chest x-ray dated 06/21/2020. FINDINGS: Reaccumulating RIGHT pleural effusion, moderate in size. Stable chronic airspace opacity within the RIGHT upper lobe, better demonstrated as a mass on chest CT of 05/15/2020. No new findings on the LEFT. Heart size and mediastinal contours appear stable. IMPRESSION: 1. Reaccumulating RIGHT pleural effusion, moderate in size. 2. Stable chronic airspace opacity within the RIGHT upper lobe, better demonstrated as a mass on chest CT of 05/15/2020. Electronically Signed  By: Franki Cabot M.D.   On: 07/01/2020 15:15   CT Angio Chest PE W and/or Wo Contrast  Result Date: 07/07/2020 CLINICAL DATA:  Chest pain or shortness of breath.  Lung masses EXAM: CT ANGIOGRAPHY CHEST WITH CONTRAST TECHNIQUE: Multidetector CT imaging of the chest was performed using the standard protocol during bolus administration of intravenous contrast. Multiplanar CT image reconstructions and MIPs were obtained to  evaluate the vascular anatomy. CONTRAST:  38mL OMNIPAQUE IOHEXOL 350 MG/ML SOLN COMPARISON:  Chest CT 05/15/2020 FINDINGS: Cardiovascular: Contrast injection is sufficient to demonstrate satisfactory opacification of the pulmonary arteries to the segmental level. There is no pulmonary embolus or evidence of right heart strain. The size of the main pulmonary artery is normal. Heart size is normal, with no pericardial effusion. The course and caliber of the aorta are normal. There is mild atherosclerosis. No acute finding. Mediastinum/Nodes: Unchanged appearance of large prevascular node measuring 2.5 cm. Lungs/Pleura: Large tumor in the right lung with surrounding pleural fluid. There is pleural nodularity along the right minor fissure. Multiple nodular opacities at the right lower lobe. Tumor surrounds the right mainstem bronchus and severely narrows the right upper lobe bronchus. The right pulmonary artery is also encased. Tumor size is increased. Upper Abdomen: Contrast bolus timing is not optimized for evaluation of the abdominal organs. The visualized portions of the organs of the upper abdomen are normal. Musculoskeletal: No chest wall abnormality. No bony spinal canal stenosis. Review of the MIP images confirms the above findings. IMPRESSION: 1. No pulmonary embolus or acute aortic syndrome. 2. 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 Tumor surrounds the right mainstem bronchus and severely narrows the right upper lobe bronchus. The right pulmonary artery is also encased. 3. Multiple nodular opacities at the right lung base, concerning for metastatic disease versus areas of consolidation. 4. Unchanged appearance of large prevascular node. Aortic Atherosclerosis (ICD10-I70.0). Electronically Signed   By: Ulyses Jarred M.D.   On: 07/07/2020 21:46   DG CHEST PORT 1 VIEW  Result Date: 07/09/2020 CLINICAL DATA:  Pleural effusion shortness of breath. EXAM: PORTABLE CHEST 1 VIEW COMPARISON:   07/07/2020 FINDINGS: Lungs are adequately inflated demonstrate no change in patient's known masslike density over the right suprahilar region. There is interval worsening of a moderate size right pleural effusion. Likely associated atelectasis in the right base. Left lung is clear. Cardiomediastinal silhouette and remainder of the exam is unchanged. IMPRESSION: 1. Interval worsening moderate size right pleural effusion likely with associated atelectasis right base. 2. Stable masslike density over the right suprahilar region. Electronically Signed   By: Marin Olp M.D.   On: 07/09/2020 14:27   DG Chest Port 1 View  Result Date: 07/01/2020 CLINICAL DATA:  Post right-sided thoracentesis. EXAM: PORTABLE CHEST 1 VIEW COMPARISON:  Radiograph 07/01/2020, CT 05/15/2020 FINDINGS: Slight decreased right pleural effusion after thoracentesis. No visualized pneumothorax or pleural line. Opacity in the right lung most prominent in the suprahilar region is unchanged from earlier today. Stable heart size and mediastinal contours. Aortic atherosclerosis. Mild atelectasis at the left lung base. Lingular nodule on CT is not well seen by radiograph. Bones are diffusely under mineralized. Remote left rib fractures. IMPRESSION: 1. Slight decreased right pleural effusion after thoracentesis. No visualized pneumothorax. 2. Unchanged right upper lung zone opacity, right upper lobe mass on prior CT. Electronically Signed   By: Keith Rake M.D.   On: 07/01/2020 19:39   DG ABD ACUTE 2+V W 1V CHEST  Result Date: 06/16/2020  CLINICAL DATA:  Constipation.  History of lung cancer. EXAM: DG ABDOMEN ACUTE WITH 1 VIEW CHEST COMPARISON:  Chest x-ray dated 06/14/2020 FINDINGS: Diffuse airspace opacification is again noted throughout the right lung field. There is a right-sided pleural effusion. There is right-sided volume loss. Aortic calcifications are noted. The heart size is stable. There is no pneumothorax. There is a large amount of  stool throughout the colon. The bowel gas pattern is nonobstructive. There are degenerative changes throughout the lumbar spine. The patient is status post prior total hip arthroplasty bilaterally. IMPRESSION: 1. Nonobstructive bowel gas pattern. 2. Large amount of stool throughout the colon. 3. Stable appearance of the chest. Electronically Signed   By: Constance Holster M.D.   On: 06/16/2020 18:35   US THORACENTESIS ASP PLEURAL SPACE W/IMG GUIDE  Result Date: 07/10/2020 INDICATION: Patient with history of small cell lung cancer, dyspnea, recurrent right pleural effusion. Request received for therapeutic right thoracentesis. EXAM: ULTRASOUND GUIDED THERAPEUTIC RIGHT THORACENTESIS MEDICATIONS: 1% lidocaine to skin and subcutaneous tissue COMPLICATIONS: None immediate. PROCEDURE: An ultrasound guided thoracentesis was thoroughly discussed with the patient and questions answered. The benefits, risks, alternatives and complications were also discussed. The patient understands and wishes to proceed with the procedure. Written consent was obtained. Ultrasound was performed to localize and mark an adequate pocket of fluid in the right chest. The area was then prepped and draped in the normal sterile fashion. 1% Lidocaine was used for local anesthesia. Under ultrasound guidance a 6 Fr Safe-T-Centesis catheter was introduced. Thoracentesis was performed. The catheter was removed and a dressing applied. FINDINGS: A total of approximately 1.1 liters of amber fluid was removed. Due to patient coughing only the above amount of fluid was removed today. IMPRESSION: Successful ultrasound guided therapeutic right thoracentesis yielding 1.1 liters of pleural fluid. Read by: Rowe Robert, PA-C Electronically Signed   By: Sandi Mariscal M.D.   On: 07/10/2020 10:55   US Thoracentesis Asp Pleural space w/IMG guide  Result Date: 06/21/2020 INDICATION: Patient with a history of lung cancer and recurrent right pleural effusion.  Interventional radiology asked to perform a therapeutic thoracentesis. EXAM: ULTRASOUND GUIDED THORACENTESIS MEDICATIONS: 1% lidocaine 10 mL COMPLICATIONS: None immediate. PROCEDURE: An ultrasound guided thoracentesis was thoroughly discussed with the patient and questions answered. The benefits, risks, alternatives and complications were also discussed. The patient understands and wishes to proceed with the procedure. Written consent was obtained. Ultrasound was performed to localize and mark an adequate pocket of fluid in the right chest. The area was then prepped and draped in the normal sterile fashion. 1% Lidocaine was used for local anesthesia. Under ultrasound guidance a 6 Fr Safe-T-Centesis catheter was introduced. Thoracentesis was performed. The catheter was removed and a dressing applied. FINDINGS: A total of approximately 1 L of amber-colored fluid was removed. IMPRESSION: Successful ultrasound guided right thoracentesis yielding 1 L of pleural fluid. Read by: Soyla Dryer, NP Electronically Signed   By: Jerilynn Mages.  Shick M.D.   On: 06/21/2020 11:13    ASSESSMENT AND PLAN: 1. Right lung mass concerning for malignancy -05/15/2020-CT chest with contrast showed a 7.2 x 5.9 cm right upper lobe lung mass with invasion into the right hilum and mediastinum, extensive mediastinal and right hilar lymphadenopathy, extensive interstitial spread of tumor in the right lung and large loculated right pleural fluid collection likely secondary to pleural metastatic disease, scattered pulmonary nodules in the left lung suspicious for metastatic disease, 13 x 10.5 mm spiculated lesion in the lingula which could be a second  primary. -Cytology on pleural fluid 11/23/2021positivefor malignant cells;cluster of cells positive forMOC31, TTF-1,synaptophysinand CD 56; possibility of small cell carcinoma raised -Cytology on pleural fluid 05/26/2020-malignant cells present with immunophenotype raising possibility of small  cell carcinoma -Biopsy right supraclavicular lymph node 06/09/2020-small cell carcinoma -Cycle 1 modified Carboplatin/Etoposide plus atezolizumab 06/14/2020 -CT chest 07/07/2020- large tumor in the right chest surrounding the right mainstem bronchus and narrowing the right upper lobe bronchus, unchanged large prevascular node -Palliative radiation to the right chest beginning 07/11/2020 2. Pleural effusion-malignant 3.Postobstructive pneumonia? 4. Hyponatremia 5. Mild normocytic anemia 6. Thrombocytosis 7. Hypertension 8. AAA status post repair in 2009 9. PAD 10. Hyperlipidemia 11. Depression  12. 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  Hospital admission 07/07/2020-dyspnea secondary to lung tumor and pleural effusion  Mary Merritt appears unchanged.  She is receiving radiation to the chest and is scheduled to complete 10 fractions.    We will plan to check chest x-ray today to determine if pleural effusion has reaccumulated and will consider for thoracentesis.  The patient will be discharging to SNF for rehab later today.  Hope is for her to return home in the next few weeks.  We will arrange for outpatient follow-up with cancer.  Recommendations: 1.  Continue palliative radiation to the right chest 2.  We will obtain a repeat chest x-ray today and consider for repeat thoracentesis if pleural effusion has recurred 3.  The patient will be discharging to SNF hopefully later today.  Outpatient follow-up has been at the cancer on 07/28/2020.   LOS: 7 days   Mikey Bussing, DNP, AGPCNP-BC, AOCNP 07/14/20 Mary Merritt was interviewed and examined.  Her daughter was present by telephone during today's visit.  She appears stable.  She continues palliative radiation to the right chest.  Today's chest x-ray reveals a stable small right pleural effusion.  Mary Merritt will be scheduled for outpatient follow-up at the cancer center near the completion of radiation.  We will discuss  continuing chemotherapy at that visit.  We are available to see her in the interim as needed.  I performed medical decision making and was present for greater than 50% of today's visit.

## 2020-07-17 ENCOUNTER — Ambulatory Visit
Admission: RE | Admit: 2020-07-17 | Discharge: 2020-07-17 | Disposition: A | Discharge status: Home / Self Care | Payer: Medicare Other | Source: Ambulatory Visit | Attending: Radiation Oncology | Admitting: Radiation Oncology

## 2020-07-17 DIAGNOSIS — C3481 Malignant neoplasm of overlapping sites of right bronchus and lung: Secondary | ICD-10-CM | POA: Diagnosis present

## 2020-07-17 DIAGNOSIS — Z510 Encounter for antineoplastic radiation therapy: Secondary | ICD-10-CM | POA: Diagnosis not present

## 2020-07-18 ENCOUNTER — Other Ambulatory Visit: Payer: Self-pay

## 2020-07-18 ENCOUNTER — Ambulatory Visit
Admission: RE | Admit: 2020-07-18 | Discharge: 2020-07-18 | Disposition: A | Discharge status: Home / Self Care | Payer: Medicare Other | Source: Ambulatory Visit | Attending: Radiation Oncology | Admitting: Radiation Oncology

## 2020-07-18 DIAGNOSIS — Z510 Encounter for antineoplastic radiation therapy: Secondary | ICD-10-CM | POA: Diagnosis not present

## 2020-07-19 ENCOUNTER — Ambulatory Visit
Admission: RE | Admit: 2020-07-19 | Discharge: 2020-07-19 | Disposition: A | Discharge status: Home / Self Care | Payer: Medicare Other | Source: Ambulatory Visit | Attending: Radiation Oncology | Admitting: Radiation Oncology

## 2020-07-19 ENCOUNTER — Other Ambulatory Visit: Payer: Self-pay

## 2020-07-19 ENCOUNTER — Encounter: Payer: Self-pay | Admitting: Nurse Practitioner

## 2020-07-19 DIAGNOSIS — Z510 Encounter for antineoplastic radiation therapy: Secondary | ICD-10-CM | POA: Diagnosis not present

## 2020-07-20 ENCOUNTER — Other Ambulatory Visit: Payer: Self-pay

## 2020-07-20 ENCOUNTER — Ambulatory Visit (HOSPITAL_COMMUNITY)
Admission: RE | Admit: 2020-07-20 | Discharge: 2020-07-20 | Disposition: A | Discharge status: Home / Self Care | Payer: No Typology Code available for payment source | Source: Ambulatory Visit | Attending: Nurse Practitioner | Admitting: Nurse Practitioner

## 2020-07-20 ENCOUNTER — Ambulatory Visit
Admission: RE | Admit: 2020-07-20 | Discharge: 2020-07-20 | Disposition: A | Discharge status: Home / Self Care | Payer: Medicare Other | Source: Ambulatory Visit | Attending: Radiation Oncology | Admitting: Radiation Oncology

## 2020-07-20 DIAGNOSIS — Z510 Encounter for antineoplastic radiation therapy: Secondary | ICD-10-CM | POA: Diagnosis not present

## 2020-07-20 DIAGNOSIS — C3491 Malignant neoplasm of unspecified part of right bronchus or lung: Secondary | ICD-10-CM | POA: Diagnosis present

## 2020-07-21 ENCOUNTER — Ambulatory Visit
Admission: RE | Admit: 2020-07-21 | Discharge: 2020-07-21 | Disposition: A | Discharge status: Home / Self Care | Payer: Medicare Other | Source: Ambulatory Visit | Attending: Radiation Oncology | Admitting: Radiation Oncology

## 2020-07-21 ENCOUNTER — Other Ambulatory Visit: Payer: Self-pay

## 2020-07-21 ENCOUNTER — Other Ambulatory Visit (HOSPITAL_COMMUNITY)
Admission: RE | Admit: 2020-07-21 | Discharge: 2020-07-21 | Disposition: A | Discharge status: Home / Self Care | Payer: Medicare Other | Source: Ambulatory Visit | Attending: Nurse Practitioner | Admitting: Nurse Practitioner

## 2020-07-21 ENCOUNTER — Telehealth: Payer: Self-pay

## 2020-07-21 DIAGNOSIS — Z510 Encounter for antineoplastic radiation therapy: Secondary | ICD-10-CM | POA: Diagnosis not present

## 2020-07-21 DIAGNOSIS — Z20822 Contact with and (suspected) exposure to covid-19: Secondary | ICD-10-CM | POA: Diagnosis not present

## 2020-07-21 DIAGNOSIS — C3490 Malignant neoplasm of unspecified part of unspecified bronchus or lung: Secondary | ICD-10-CM

## 2020-07-21 DIAGNOSIS — J90 Pleural effusion, not elsewhere classified: Secondary | ICD-10-CM | POA: Diagnosis not present

## 2020-07-21 LAB — SARS CORONAVIRUS 2 (TAT 6-24 HRS): SARS Coronavirus 2: NEGATIVE

## 2020-07-21 NOTE — Telephone Encounter (Signed)
Daughter Jocelyn Lamer contacted about appointment for pt on Monday 07/24/20 0845 at [facility] for a thoracentesis. Pt also has Covid test appointment today at 11:00 am. Daughter verbalized understanding.

## 2020-07-24 ENCOUNTER — Ambulatory Visit (HOSPITAL_COMMUNITY)
Admission: RE | Admit: 2020-07-24 | Discharge: 2020-07-24 | Disposition: A | Discharge status: Home / Self Care | Payer: Medicare Other | Source: Ambulatory Visit | Attending: Nurse Practitioner | Admitting: Nurse Practitioner

## 2020-07-24 ENCOUNTER — Other Ambulatory Visit: Payer: Self-pay

## 2020-07-24 ENCOUNTER — Ambulatory Visit
Admission: RE | Admit: 2020-07-24 | Discharge: 2020-07-24 | Disposition: A | Discharge status: Home / Self Care | Payer: Medicare Other | Source: Ambulatory Visit | Attending: Radiation Oncology | Admitting: Radiation Oncology

## 2020-07-24 ENCOUNTER — Other Ambulatory Visit: Payer: Self-pay | Admitting: Nurse Practitioner

## 2020-07-24 DIAGNOSIS — C3490 Malignant neoplasm of unspecified part of unspecified bronchus or lung: Secondary | ICD-10-CM

## 2020-07-24 DIAGNOSIS — J90 Pleural effusion, not elsewhere classified: Secondary | ICD-10-CM | POA: Insufficient documentation

## 2020-07-24 DIAGNOSIS — Z20822 Contact with and (suspected) exposure to covid-19: Secondary | ICD-10-CM | POA: Insufficient documentation

## 2020-07-24 DIAGNOSIS — Z510 Encounter for antineoplastic radiation therapy: Secondary | ICD-10-CM | POA: Diagnosis not present

## 2020-07-24 HISTORY — PX: IR THORACENTESIS ASP PLEURAL SPACE W/IMG GUIDE: IMG5380

## 2020-07-24 MED ORDER — LIDOCAINE HCL 1 % IJ SOLN
INTRAMUSCULAR | Status: AC
  Filled 2020-07-24: qty 20

## 2020-07-24 MED ORDER — LIDOCAINE HCL (PF) 1 % IJ SOLN
INTRAMUSCULAR | Status: DC | PRN
  Administered 2020-07-24: 10 mL

## 2020-07-24 NOTE — Procedures (Signed)
PROCEDURE SUMMARY:  Successful US guided right thoracentesis. Yielded 800 ml of amber-colored fluid. Pt tolerated procedure well. No immediate complications.  CXR ordered; no post-procedure pneumothorax identified.   EBL < 2 mL  Theresa Duty, NP 07/24/2020 10:08 AM

## 2020-07-25 ENCOUNTER — Other Ambulatory Visit: Payer: Self-pay | Admitting: Radiation Oncology

## 2020-07-25 ENCOUNTER — Other Ambulatory Visit: Payer: Self-pay

## 2020-07-25 ENCOUNTER — Ambulatory Visit
Admission: RE | Admit: 2020-07-25 | Discharge: 2020-07-25 | Disposition: A | Discharge status: Home / Self Care | Payer: Medicare Other | Source: Ambulatory Visit | Attending: Radiation Oncology | Admitting: Radiation Oncology

## 2020-07-25 DIAGNOSIS — C3481 Malignant neoplasm of overlapping sites of right bronchus and lung: Secondary | ICD-10-CM | POA: Diagnosis present

## 2020-07-25 DIAGNOSIS — Z510 Encounter for antineoplastic radiation therapy: Secondary | ICD-10-CM | POA: Diagnosis present

## 2020-07-25 MED ORDER — SUCRALFATE 1 G PO TABS: 1.0000 g | ORAL_TABLET | Freq: Three times a day (TID) | ORAL | 2 refills | Status: DC

## 2020-07-26 ENCOUNTER — Ambulatory Visit
Admission: RE | Admit: 2020-07-26 | Discharge: 2020-07-26 | Disposition: A | Discharge status: Home / Self Care | Payer: Medicare Other | Source: Ambulatory Visit | Attending: Radiation Oncology | Admitting: Radiation Oncology

## 2020-07-26 ENCOUNTER — Other Ambulatory Visit: Payer: Self-pay

## 2020-07-26 DIAGNOSIS — Z510 Encounter for antineoplastic radiation therapy: Secondary | ICD-10-CM | POA: Diagnosis not present

## 2020-07-27 ENCOUNTER — Ambulatory Visit
Admission: RE | Admit: 2020-07-27 | Discharge: 2020-07-27 | Disposition: A | Discharge status: Home / Self Care | Payer: Medicare Other | Source: Ambulatory Visit | Attending: Radiation Oncology | Admitting: Radiation Oncology

## 2020-07-27 DIAGNOSIS — Z510 Encounter for antineoplastic radiation therapy: Secondary | ICD-10-CM | POA: Diagnosis not present

## 2020-07-28 ENCOUNTER — Encounter: Payer: Self-pay | Admitting: Nurse Practitioner

## 2020-07-28 ENCOUNTER — Inpatient Hospital Stay: Payer: Medicare Other | Attending: Nurse Practitioner | Admitting: Nurse Practitioner

## 2020-07-28 ENCOUNTER — Other Ambulatory Visit: Payer: Self-pay

## 2020-07-28 ENCOUNTER — Ambulatory Visit
Admission: RE | Admit: 2020-07-28 | Discharge: 2020-07-28 | Disposition: A | Discharge status: Home / Self Care | Payer: Medicare Other | Source: Ambulatory Visit | Attending: Radiation Oncology | Admitting: Radiation Oncology

## 2020-07-28 VITALS — BP 144/74 | HR 92 | Temp 97.8°F | Resp 18 | Ht 65.0 in | Wt 147.6 lb

## 2020-07-28 DIAGNOSIS — Z5111 Encounter for antineoplastic chemotherapy: Secondary | ICD-10-CM | POA: Insufficient documentation

## 2020-07-28 DIAGNOSIS — C3490 Malignant neoplasm of unspecified part of unspecified bronchus or lung: Secondary | ICD-10-CM

## 2020-07-28 DIAGNOSIS — I10 Essential (primary) hypertension: Secondary | ICD-10-CM | POA: Diagnosis not present

## 2020-07-28 DIAGNOSIS — E785 Hyperlipidemia, unspecified: Secondary | ICD-10-CM | POA: Insufficient documentation

## 2020-07-28 DIAGNOSIS — E871 Hypo-osmolality and hyponatremia: Secondary | ICD-10-CM | POA: Insufficient documentation

## 2020-07-28 DIAGNOSIS — Z510 Encounter for antineoplastic radiation therapy: Secondary | ICD-10-CM | POA: Diagnosis not present

## 2020-07-28 DIAGNOSIS — C3411 Malignant neoplasm of upper lobe, right bronchus or lung: Secondary | ICD-10-CM | POA: Diagnosis present

## 2020-07-28 DIAGNOSIS — J910 Malignant pleural effusion: Secondary | ICD-10-CM | POA: Insufficient documentation

## 2020-07-28 DIAGNOSIS — F32A Depression, unspecified: Secondary | ICD-10-CM | POA: Insufficient documentation

## 2020-07-28 DIAGNOSIS — D472 Monoclonal gammopathy: Secondary | ICD-10-CM | POA: Diagnosis not present

## 2020-07-28 DIAGNOSIS — C770 Secondary and unspecified malignant neoplasm of lymph nodes of head, face and neck: Secondary | ICD-10-CM | POA: Insufficient documentation

## 2020-07-28 DIAGNOSIS — F419 Anxiety disorder, unspecified: Secondary | ICD-10-CM | POA: Insufficient documentation

## 2020-07-28 DIAGNOSIS — D649 Anemia, unspecified: Secondary | ICD-10-CM | POA: Diagnosis not present

## 2020-07-28 DIAGNOSIS — Z5112 Encounter for antineoplastic immunotherapy: Secondary | ICD-10-CM | POA: Diagnosis not present

## 2020-07-28 NOTE — Progress Notes (Addendum)
[place] OFFICE PROGRESS NOTE   Diagnosis: Small cell lung cancer  INTERVAL HISTORY:   Mary Merritt returns as scheduled.  She continues radiation.  She had a thoracentesis 07/24/2020 with 800 cc of fluid removed.  She continues to have dyspnea.  She is much more comfortable on oxygen.  Periodic cough.  No fever.  Appetite is poor.  She is currently residing at a skilled nursing facility and participating in physical therapy.  She feels her strength is improving.  Objective:  Vital signs in last 24 hours:  Blood pressure (!) 144/74, pulse 92, temperature 97.8 F (36.6 C), temperature source Tympanic, resp. rate 18, height 5\' 5"  (1.651 m), weight 147 lb 9.6 oz (67 kg), SpO2 99 %.    Resp: Breath sounds diminished right lower lung field; scattered rales.  Increased respiratory rate. Cardio: Regular rate and rhythm. GI: No hepatosplenomegaly. Vascular: Left lower leg/ankle edema. Neuro: Alert and oriented. Skin: Erythema at the back.   Lab Results:  Lab Results  Component Value Date   WBC 8.2 07/11/2020   HGB 12.3 07/11/2020   HCT 37.3 07/11/2020   MCV 87.8 07/11/2020   PLT 395 07/11/2020   NEUTROABS 6.6 07/11/2020    Imaging:  No results found.  Medications: I have reviewed the patient's current medications.  Assessment/Plan: 1. Right lung mass concerning for malignancy -05/15/2020-CT chest with contrast showed a 7.2 x 5.9 cm right upper lobe lung mass with invasion into the right hilum and mediastinum, extensive mediastinal and right hilar lymphadenopathy, extensive interstitial spread of tumor in the right lung and large loculated right pleural fluid collection likely secondary to pleural metastatic disease, scattered pulmonary nodules in the left lung suspicious for metastatic disease, 13 x 10.5 mm spiculated lesion in the lingula which could be a second primary. -Cytology on pleural fluid 11/23/2021positivefor malignant cells;cluster of cells  positive forMOC31, TTF-1,synaptophysinand CD 56; possibility of small cell carcinoma raised -Cytology on pleural fluid 05/26/2020-malignant cells present with immunophenotype raising possibility of small cell carcinoma -Biopsy right supraclavicular lymph node 06/09/2020-small cell carcinoma -Cycle 1 modified Carboplatin/Etoposide plus atezolizumab 06/14/2020 -CT chest 07/07/2020- large tumor in the right chest surrounding the right mainstem bronchus and narrowing the right upper lobe bronchus, unchanged large prevascular node -Palliative radiation to the right chest beginning 07/11/2020-07/31/2020 -Cycle 2 modified Carboplatin/Etoposide plus atezolizumab 08/02/2020 2. Pleural effusion-malignant 3.Postobstructive pneumonia? 4. Hyponatremia 5. Mild normocytic anemia 6. Thrombocytosis 7. Hypertension 8. AAA status post repair in 2009 9. PAD 10. Hyperlipidemia 11. Depression  12. 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  Hospital admission 07/07/2020-dyspnea secondary to lung tumor and pleural effusion  Disposition: Mary Merritt appears unchanged.  She has completed 1 cycle of modified Carboplatin/Etoposide plus atezolizumab.  Chest CT during the recent hospitalization was without significant change.  She is currently completing radiation to the right chest.  Dr. Benay Spice reviewed the above with Ms. Roell and her daughter at today's visit and recommends another cycle of carboplatin/Etoposide/atezolizumab.  They are in agreement with this plan.  She will return for cycle 2 beginning 08/02/2020.  We will see her in follow-up with a chest x-ray on 08/09/2020.  Patient seen with Dr. Benay Spice.    Ned Card ANP/GNP-BC   07/28/2020  11:02 AM This was a shared visit with Ned Card.  Mary Merritt was interviewed and examined.  We reviewed recent chest x-ray images.  I discussed treatment options with Mary Merritt and her daughter.  She did not experience significant improvement in dyspnea following  the most  recent thoracentesis.  I suspect the dyspnea is most likely due to tumor in the right chest.  She will complete palliative radiation on 07/31/2020.  I recommend she complete another cycle of systemic therapy.  She is in agreement.  The plan is to complete cycle 2 etoposide/carboplatin and atezolizumab next week.  I was present for greater than 50% of today's visit.  I performed medical decision making.  Julieanne Manson, MD

## 2020-07-31 ENCOUNTER — Other Ambulatory Visit: Payer: Self-pay

## 2020-07-31 ENCOUNTER — Telehealth: Payer: Self-pay | Admitting: *Deleted

## 2020-07-31 ENCOUNTER — Ambulatory Visit
Admission: RE | Admit: 2020-07-31 | Discharge: 2020-07-31 | Disposition: A | Discharge status: Home / Self Care | Payer: Medicare Other | Source: Ambulatory Visit | Attending: Radiation Oncology | Admitting: Radiation Oncology

## 2020-07-31 DIAGNOSIS — Z510 Encounter for antineoplastic radiation therapy: Secondary | ICD-10-CM | POA: Diagnosis not present

## 2020-07-31 NOTE — Telephone Encounter (Signed)
Daughter left VM that patient is supposed to have one more round of chemo this week, but have not heard anything from scheduling yet. Confirmed w/OV note of 07/28/20 that she is to begin last cycle on 08/02/20. Sent message to [facility] in scheduling to please follow up.

## 2020-08-01 ENCOUNTER — Telehealth: Payer: Self-pay | Admitting: Nurse Practitioner

## 2020-08-01 NOTE — Telephone Encounter (Signed)
Appointments scheduled in [facility] due to [place] being overbooked. Scheduled per 2/4 los. Spoke to both patient and patient's daughter Dub Mikes, both are aware of appointments dates and times as well as location in [place].

## 2020-08-02 ENCOUNTER — Telehealth: Payer: Self-pay | Admitting: *Deleted

## 2020-08-02 ENCOUNTER — Other Ambulatory Visit: Payer: Self-pay | Admitting: Oncology

## 2020-08-02 ENCOUNTER — Inpatient Hospital Stay: Payer: Medicare Other

## 2020-08-02 ENCOUNTER — Encounter: Payer: Self-pay | Admitting: Radiation Oncology

## 2020-08-02 ENCOUNTER — Other Ambulatory Visit: Payer: Self-pay

## 2020-08-02 VITALS — BP 132/62 | HR 100 | Temp 97.8°F | Resp 18

## 2020-08-02 DIAGNOSIS — Z5112 Encounter for antineoplastic immunotherapy: Secondary | ICD-10-CM | POA: Diagnosis not present

## 2020-08-02 DIAGNOSIS — C3490 Malignant neoplasm of unspecified part of unspecified bronchus or lung: Secondary | ICD-10-CM

## 2020-08-02 LAB — CBC WITH DIFFERENTIAL (CANCER CENTER ONLY)
Abs Immature Granulocytes: 0.04 10*3/uL (ref 0.00–0.07)
Basophils Absolute: 0 10*3/uL (ref 0.0–0.1)
Basophils Relative: 0 %
Eosinophils Absolute: 0.1 10*3/uL (ref 0.0–0.5)
Eosinophils Relative: 1 %
HCT: 32.6 % — ABNORMAL LOW (ref 36.0–46.0)
Hemoglobin: 10.7 g/dL — ABNORMAL LOW (ref 12.0–15.0)
Immature Granulocytes: 1 %
Lymphocytes Relative: 6 %
Lymphs Abs: 0.4 10*3/uL — ABNORMAL LOW (ref 0.7–4.0)
MCH: 28.7 pg (ref 26.0–34.0)
MCHC: 32.8 g/dL (ref 30.0–36.0)
MCV: 87.4 fL (ref 80.0–100.0)
Monocytes Absolute: 0.7 10*3/uL (ref 0.1–1.0)
Monocytes Relative: 9 %
Neutro Abs: 6 10*3/uL (ref 1.7–7.7)
Neutrophils Relative %: 83 %
Platelet Count: 336 10*3/uL (ref 150–400)
RBC: 3.73 MIL/uL — ABNORMAL LOW (ref 3.87–5.11)
RDW: 15.9 % — ABNORMAL HIGH (ref 11.5–15.5)
WBC Count: 7.3 10*3/uL (ref 4.0–10.5)
nRBC: 0 % (ref 0.0–0.2)

## 2020-08-02 LAB — CMP (CANCER CENTER ONLY)
ALT: 12 U/L (ref 0–44)
AST: 17 U/L (ref 15–41)
Albumin: 3.4 g/dL — ABNORMAL LOW (ref 3.5–5.0)
Alkaline Phosphatase: 68 U/L (ref 38–126)
Anion gap: 8 (ref 5–15)
BUN: 8 mg/dL (ref 8–23)
CO2: 32 mmol/L (ref 22–32)
Calcium: 9.5 mg/dL (ref 8.9–10.3)
Chloride: 93 mmol/L — ABNORMAL LOW (ref 98–111)
Creatinine: 0.57 mg/dL (ref 0.44–1.00)
GFR, Estimated: >60 mL/min (ref >60)
Glucose, Bld: 160 mg/dL — ABNORMAL HIGH (ref 70–99)
Potassium: 3.7 mmol/L (ref 3.5–5.1)
Sodium: 133 mmol/L — ABNORMAL LOW (ref 135–145)
Total Bilirubin: 0.4 mg/dL (ref 0.3–1.2)
Total Protein: 6 g/dL — ABNORMAL LOW (ref 6.5–8.1)

## 2020-08-02 MED ORDER — SODIUM CHLORIDE 0.9 % IV SOLN
150.0000 mg | Freq: Once | INTRAVENOUS | Status: AC
  Administered 2020-08-02: 150 mg via INTRAVENOUS
  Filled 2020-08-02: qty 150

## 2020-08-02 MED ORDER — SUCRALFATE 1 G PO TABS: 1.0000 g | ORAL_TABLET | Freq: Three times a day (TID) | ORAL | 1 refills | Status: DC

## 2020-08-02 MED ORDER — SODIUM CHLORIDE 0.9% FLUSH
10.0000 mL | INTRAVENOUS | Status: DC | PRN
  Filled 2020-08-02: qty 10

## 2020-08-02 MED ORDER — PALONOSETRON HCL INJECTION 0.25 MG/5ML
0.2500 mg | Freq: Once | INTRAVENOUS | Status: AC
  Administered 2020-08-02: 0.25 mg via INTRAVENOUS

## 2020-08-02 MED ORDER — HEPARIN SOD (PORK) LOCK FLUSH 100 UNIT/ML IV SOLN
500.0000 [IU] | Freq: Once | INTRAVENOUS | Status: DC | PRN
  Filled 2020-08-02: qty 5

## 2020-08-02 MED ORDER — SODIUM CHLORIDE 0.9 % IV SOLN
10.0000 mg | Freq: Once | INTRAVENOUS | Status: AC
  Administered 2020-08-02: 10 mg via INTRAVENOUS
  Filled 2020-08-02: qty 10

## 2020-08-02 MED ORDER — PALONOSETRON HCL INJECTION 0.25 MG/5ML
INTRAVENOUS | Status: AC
  Filled 2020-08-02: qty 5

## 2020-08-02 MED ORDER — SODIUM CHLORIDE 0.9 % IV SOLN
Freq: Once | INTRAVENOUS | Status: AC
  Filled 2020-08-02: qty 250

## 2020-08-02 MED ORDER — SODIUM CHLORIDE 0.9 % IV SOLN
60.0000 mg/m2 | Freq: Once | INTRAVENOUS | Status: AC
  Administered 2020-08-02: 110 mg via INTRAVENOUS
  Filled 2020-08-02: qty 5.5

## 2020-08-02 MED ORDER — SODIUM CHLORIDE 0.9 % IV SOLN
1200.0000 mg | Freq: Once | INTRAVENOUS | Status: AC
  Administered 2020-08-02: 1200 mg via INTRAVENOUS
  Filled 2020-08-02: qty 20

## 2020-08-02 MED ORDER — SODIUM CHLORIDE 0.9 % IV SOLN
262.8000 mg | Freq: Once | INTRAVENOUS | Status: AC
  Administered 2020-08-02: 260 mg via INTRAVENOUS
  Filled 2020-08-02: qty 26

## 2020-08-02 NOTE — Patient Instructions (Signed)
[facility] Discharge Instructions for Patients Receiving Chemotherapy  Today you received the following chemotherapy agents Tecentriq, Carboplatin and Etoposide  To help prevent nausea and vomiting after your treatment, we encourage you to take your nausea medication as prescribed by MD.   If you develop nausea and vomiting that is not controlled by your nausea medication, call the clinic.   BELOW ARE SYMPTOMS THAT SHOULD BE REPORTED IMMEDIATELY:  *FEVER GREATER THAN 100.5 F  *CHILLS WITH OR WITHOUT FEVER  NAUSEA AND VOMITING THAT IS NOT CONTROLLED WITH YOUR NAUSEA MEDICATION  *UNUSUAL SHORTNESS OF BREATH  *UNUSUAL BRUISING OR BLEEDING  TENDERNESS IN MOUTH AND THROAT WITH OR WITHOUT PRESENCE OF ULCERS  *URINARY PROBLEMS  *BOWEL PROBLEMS  UNUSUAL RASH Items with * indicate a potential emergency and should be followed up as soon as possible.  Feel free to call the clinic should you have any questions or concerns. The clinic phone number is [phone].  Please show the [place] at check-in to the Emergency Department and triage nurse.

## 2020-08-02 NOTE — Telephone Encounter (Addendum)
Being discharged from SNF tomorrow and asking how to get script for the Carafate since she is still having trouble swallowing and pain. OK to re-order, per Dr. Benay Spice. Daughter notified via VM.

## 2020-08-02 NOTE — Progress Notes (Signed)
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 She did have productive cough during treatment.  Plan: The patient will receive a call in about one month from the radiation oncology department. She will continue follow up with Dr. Learta Codding as well as she continues with systemic chemotherapy.   ________________________________________________    Carola Rhine, PAC

## 2020-08-02 NOTE — Progress Notes (Signed)
Reviewed pt vital signs with Ned Card NP and Dr. Benay Spice and pt ok to treat with heart rate 100.

## 2020-08-03 ENCOUNTER — Inpatient Hospital Stay: Payer: Medicare Other

## 2020-08-03 ENCOUNTER — Other Ambulatory Visit: Payer: Medicare Other

## 2020-08-03 VITALS — BP 151/81 | HR 84 | Temp 97.5°F | Resp 18

## 2020-08-03 DIAGNOSIS — Z5112 Encounter for antineoplastic immunotherapy: Secondary | ICD-10-CM | POA: Diagnosis not present

## 2020-08-03 DIAGNOSIS — C3490 Malignant neoplasm of unspecified part of unspecified bronchus or lung: Secondary | ICD-10-CM

## 2020-08-03 MED ORDER — SODIUM CHLORIDE 0.9 % IV SOLN
Freq: Once | INTRAVENOUS | Status: AC
  Filled 2020-08-03: qty 250

## 2020-08-03 MED ORDER — SODIUM CHLORIDE 0.9 % IV SOLN
10.0000 mg | Freq: Once | INTRAVENOUS | Status: AC
  Administered 2020-08-03: 10 mg via INTRAVENOUS
  Filled 2020-08-03: qty 10

## 2020-08-03 MED ORDER — SODIUM CHLORIDE 0.9 % IV SOLN
60.0000 mg/m2 | Freq: Once | INTRAVENOUS | Status: AC
  Administered 2020-08-03: 110 mg via INTRAVENOUS
  Filled 2020-08-03: qty 5

## 2020-08-03 NOTE — Patient Instructions (Signed)
Etoposide, VP-16 injection What is this medicine? ETOPOSIDE, VP-16 (e toe POE side) is a chemotherapy drug. It is used to treat testicular cancer, lung cancer, and other cancers. This medicine may be used for other purposes; ask your health care provider or pharmacist if you have questions. COMMON BRAND NAME(S): Etopophos, Toposar, VePesid What should I tell my health care provider before I take this medicine? They need to know if you have any of these conditions:  infection  kidney disease  liver disease  low blood counts, like low white cell, platelet, or red cell counts  an unusual or allergic reaction to etoposide, other medicines, foods, dyes, or preservatives  pregnant or trying to get pregnant  breast-feeding How should I use this medicine? This medicine is for infusion into a vein. It is administered in a hospital or clinic by a specially trained health care professional. Talk to your pediatrician regarding the use of this medicine in children. Special care may be needed. Overdosage: If you think you have taken too much of this medicine contact a poison control center or emergency room at once. NOTE: This medicine is only for you. Do not share this medicine with others. What if I miss a dose? It is important not to miss your dose. Call your doctor or health care professional if you are unable to keep an appointment. What may interact with this medicine? This medicine may interact with the following medications:  warfarin This list may not describe all possible interactions. Give your health care provider a list of all the medicines, herbs, non-prescription drugs, or dietary supplements you use. Also tell them if you smoke, drink alcohol, or use illegal drugs. Some items may interact with your medicine. What should I watch for while using this medicine? Visit your doctor for checks on your progress. This drug may make you feel generally unwell. This is not uncommon, as  chemotherapy can affect healthy cells as well as cancer cells. Report any side effects. Continue your course of treatment even though you feel ill unless your doctor tells you to stop. In some cases, you may be given additional medicines to help with side effects. Follow all directions for their use. Call your doctor or health care professional for advice if you get a fever, chills or sore throat, or other symptoms of a cold or flu. Do not treat yourself. This drug decreases your body's ability to fight infections. Try to avoid being around people who are sick. This medicine may increase your risk to bruise or bleed. Call your doctor or health care professional if you notice any unusual bleeding. Talk to your doctor about your risk of cancer. You may be more at risk for certain types of cancers if you take this medicine. Do not become pregnant while taking this medicine or for at least 6 months after stopping it. Women should inform their doctor if they wish to become pregnant or think they might be pregnant. Women of child-bearing potential will need to have a negative pregnancy test before starting this medicine. There is a potential for serious side effects to an unborn child. Talk to your health care professional or pharmacist for more information. Do not breast-feed an infant while taking this medicine. Men must use a latex condom during sexual contact with a woman while taking this medicine and for at least 4 months after stopping it. A latex condom is needed even if you have had a vasectomy. Contact your doctor right away if your partner  becomes pregnant. Do not donate sperm while taking this medicine and for at least 4 months after you stop taking this medicine. Men should inform their doctors if they wish to father a child. This medicine may lower sperm counts. What side effects may I notice from receiving this medicine? Side effects that you should report to your doctor or health care professional  as soon as possible:  allergic reactions like skin rash, itching or hives, swelling of the face, lips, or tongue  low blood counts - this medicine may decrease the number of white blood cells, red blood cells, and platelets. You may be at increased risk for infections and bleeding  nausea, vomiting  redness, blistering, peeling or loosening of the skin, including inside the mouth  signs and symptoms of infection like fever; chills; cough; sore throat; pain or trouble passing urine  signs and symptoms of low red blood cells or anemia such as unusually weak or tired; feeling faint or lightheaded; falls; breathing problems  unusual bruising or bleeding Side effects that usually do not require medical attention (report to your doctor or health care professional if they continue or are bothersome):  changes in taste  diarrhea  hair loss  loss of appetite  mouth sores This list may not describe all possible side effects. Call your doctor for medical advice about side effects. You may report side effects to FDA at 1-800-FDA-1088. Where should I keep my medicine? This drug is given in a hospital or clinic and will not be stored at home. NOTE: This sheet is a summary. It may not cover all possible information. If you have questions about this medicine, talk to your doctor, pharmacist, or health care provider.  2021 Elsevier/Gold Standard (2018-08-05 16:57:15)

## 2020-08-09 ENCOUNTER — Ambulatory Visit (HOSPITAL_COMMUNITY)
Admission: RE | Admit: 2020-08-09 | Discharge: 2020-08-09 | Disposition: A | Discharge status: Home / Self Care | Payer: Medicare Other | Source: Ambulatory Visit | Attending: Nurse Practitioner | Admitting: Nurse Practitioner

## 2020-08-09 ENCOUNTER — Other Ambulatory Visit: Payer: Self-pay

## 2020-08-09 ENCOUNTER — Inpatient Hospital Stay: Payer: Medicare Other

## 2020-08-09 ENCOUNTER — Inpatient Hospital Stay (HOSPITAL_BASED_OUTPATIENT_CLINIC_OR_DEPARTMENT_OTHER): Payer: Medicare Other | Admitting: Nurse Practitioner

## 2020-08-09 ENCOUNTER — Encounter: Payer: Self-pay | Admitting: Nurse Practitioner

## 2020-08-09 VITALS — BP 130/57 | HR 75 | Temp 96.5°F | Resp 13 | Ht 65.0 in | Wt 147.9 lb

## 2020-08-09 DIAGNOSIS — Z5112 Encounter for antineoplastic immunotherapy: Secondary | ICD-10-CM | POA: Diagnosis not present

## 2020-08-09 DIAGNOSIS — C3490 Malignant neoplasm of unspecified part of unspecified bronchus or lung: Secondary | ICD-10-CM | POA: Insufficient documentation

## 2020-08-09 LAB — CBC WITH DIFFERENTIAL (CANCER CENTER ONLY)
Abs Immature Granulocytes: 0.08 10*3/uL — ABNORMAL HIGH (ref 0.00–0.07)
Basophils Absolute: 0 10*3/uL (ref 0.0–0.1)
Basophils Relative: 1 %
Eosinophils Absolute: 0.1 10*3/uL (ref 0.0–0.5)
Eosinophils Relative: 3 %
HCT: 28.6 % — ABNORMAL LOW (ref 36.0–46.0)
Hemoglobin: 9.2 g/dL — ABNORMAL LOW (ref 12.0–15.0)
Immature Granulocytes: 2 %
Lymphocytes Relative: 7 %
Lymphs Abs: 0.3 10*3/uL — ABNORMAL LOW (ref 0.7–4.0)
MCH: 28.7 pg (ref 26.0–34.0)
MCHC: 32.2 g/dL (ref 30.0–36.0)
MCV: 89.1 fL (ref 80.0–100.0)
Monocytes Absolute: 0.2 10*3/uL (ref 0.1–1.0)
Monocytes Relative: 4 %
Neutro Abs: 3.5 10*3/uL (ref 1.7–7.7)
Neutrophils Relative %: 83 %
Platelet Count: 207 10*3/uL (ref 150–400)
RBC: 3.21 MIL/uL — ABNORMAL LOW (ref 3.87–5.11)
RDW: 15.5 % (ref 11.5–15.5)
WBC Count: 4.2 10*3/uL (ref 4.0–10.5)
nRBC: 0 % (ref 0.0–0.2)

## 2020-08-09 LAB — BASIC METABOLIC PANEL - CANCER CENTER ONLY
Anion gap: 4 — ABNORMAL LOW (ref 5–15)
BUN: 8 mg/dL (ref 8–23)
CO2: 33 mmol/L — ABNORMAL HIGH (ref 22–32)
Calcium: 8.3 mg/dL — ABNORMAL LOW (ref 8.9–10.3)
Chloride: 90 mmol/L — ABNORMAL LOW (ref 98–111)
Creatinine: 0.56 mg/dL (ref 0.44–1.00)
GFR, Estimated: >60 mL/min (ref >60)
Glucose, Bld: 92 mg/dL (ref 70–99)
Potassium: 4 mmol/L (ref 3.5–5.1)
Sodium: 127 mmol/L — ABNORMAL LOW (ref 135–145)

## 2020-08-09 NOTE — Progress Notes (Signed)
  [facility] OFFICE PROGRESS NOTE   Diagnosis: Small cell lung cancer  INTERVAL HISTORY:   Mary Merritt returns as scheduled.  She completed the course of radiation 07/31/2020.  She completed cycle 2 modified Carboplatin/Etoposide plus atezolizumab 08/02/2020, 08/03/2020.  She denies nausea/vomiting.  A few loose stools.  No mouth sores.  Family has noticed a rash at the right back, think this may be related to radiation.  Stable dyspnea.  Cough mainly at nighttime.  No fever.  She noted a decrease in energy after chemotherapy.  She is doing the best she can with nutrition.  Some pain with swallowing.  She is using Carafate.  Objective:  Vital signs in last 24 hours:  Blood pressure (!) 130/57, pulse 75, temperature (!) 96.5 F (35.8 C), temperature source Tympanic, resp. rate 13, height 5\' 5"  (1.651 m), weight 147 lb 14.4 oz (67.1 kg), SpO2 100 %.    HEENT: No thrush or ulcers. Resp: Diminished breath sounds right lower lung field, rales.  No respiratory distress. Cardio: Regular rate and rhythm. GI: No hepatomegaly. Vascular: Trace edema left lower leg. Neuro: Alert and oriented. Skin: Dry erythematous rash right upper/mid back.   Lab Results:  Lab Results  Component Value Date   WBC 7.3 08/02/2020   HGB 10.7 (L) 08/02/2020   HCT 32.6 (L) 08/02/2020   MCV 87.4 08/02/2020   PLT 336 08/02/2020   NEUTROABS 6.0 08/02/2020    Imaging:  No results found.  Medications: I have reviewed the patient's current medications.  Assessment/Plan: 1. Right lung mass concerning for malignancy -05/15/2020-CT chest with contrast showed a 7.2 x 5.9 cm right upper lobe lung mass with invasion into the right hilum and mediastinum, extensive mediastinal and right hilar lymphadenopathy, extensive interstitial spread of tumor in the right lung and large loculated right pleural fluid collection likely secondary to pleural metastatic disease, scattered pulmonary nodules in the left lung  suspicious for metastatic disease, 13 x 10.5 mm spiculated lesion in the lingula which could be a second primary. -Cytology on pleural fluid 11/23/2021positivefor malignant cells;cluster of cells positive forMOC31, TTF-1,synaptophysinand CD 56; possibility of small cell carcinoma raised -Cytology on pleural fluid 05/26/2020-malignant cells present with immunophenotype raising possibility of small cell carcinoma -Biopsy right supraclavicular lymph node 06/09/2020-small cell carcinoma -Cycle 1 modified Carboplatin/Etoposide plus atezolizumab 06/14/2020 -CT chest 07/07/2020-large tumor in the right chest surrounding the right mainstem bronchus and narrowing the right upper lobe bronchus, unchanged large prevascular node -Palliative radiation to the right chest beginning 07/11/2020-07/31/2020 -Cycle 2 modified Carboplatin/Etoposide plus atezolizumab 08/02/2020 2. Pleural effusion-malignant 3.Postobstructive pneumonia? 4. Hyponatremia 5. Mild normocytic anemia 6. Thrombocytosis 7. Hypertension 8. AAA status post repair in 2009 9. PAD 10. Hyperlipidemia 11. Depression  12. 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 Hospital admission 07/07/2020-dyspnea secondary to lung tumor and pleural effusion   Disposition: Mary Merritt appears unchanged.  She has completed 2 cycles of carboplatin/Etoposide plus atezolizumab.  Overall she tolerated cycle 2 well.  She completed the course of chest radiation 07/31/2020.  Hopefully she will notice some improvement in the dyspnea over the next few weeks.  She will have a chest x-ray today.  She will return to the lab for a CBC and basic metabolic panel.  We will see her back in 3 weeks, prior to cycle 3 Carboplatin/Etoposide plus atezolizumab.  Plan reviewed with Dr. Benay Spice.    Ned Card ANP/GNP-BC   08/09/2020  11:02 AM

## 2020-08-10 ENCOUNTER — Telehealth: Payer: Self-pay | Admitting: *Deleted

## 2020-08-10 ENCOUNTER — Other Ambulatory Visit: Payer: Self-pay | Admitting: *Deleted

## 2020-08-10 DIAGNOSIS — C3490 Malignant neoplasm of unspecified part of unspecified bronchus or lung: Secondary | ICD-10-CM

## 2020-08-10 NOTE — Telephone Encounter (Signed)
Was discharged from hospital to take sodium chloride tablets 1 gram bid. Patient stopped taking due to swelling in her feet. Asking if she needs to resume? Na+ on 2/16 = 127 (was 133 on 2/9).

## 2020-08-10 NOTE — Progress Notes (Signed)
Needs thoracentesis per Dr. Benay Spice: 1st available is 08/14/20 at 12:45/13:00 at [facility]. Will need COVID test on 2/18 at 12:10. Daughter notified. Instructed her to take her to the ER if she becomes acutely not able to breathe over the weekend.

## 2020-08-10 NOTE — Telephone Encounter (Signed)
Notified daughter that she does nee to resume the sodium tablets bid. Low Na+ can make her weaker and also increase risk of seizures. Will have her resume today.

## 2020-08-11 ENCOUNTER — Telehealth: Payer: Self-pay | Admitting: Nurse Practitioner

## 2020-08-11 ENCOUNTER — Ambulatory Visit (HOSPITAL_COMMUNITY)
Admission: RE | Admit: 2020-08-11 | Discharge: 2020-08-11 | Disposition: A | Discharge status: Home / Self Care | Payer: Medicare Other | Source: Ambulatory Visit | Attending: Oncology | Admitting: Oncology

## 2020-08-11 DIAGNOSIS — Z20822 Contact with and (suspected) exposure to covid-19: Secondary | ICD-10-CM | POA: Diagnosis not present

## 2020-08-11 NOTE — Telephone Encounter (Signed)
Scheduled appointments per 2/16 los. Spoke to patient who is aware of appointments dates and times.

## 2020-08-12 LAB — SARS CORONAVIRUS 2 (TAT 6-24 HRS): SARS Coronavirus 2: NEGATIVE

## 2020-08-14 ENCOUNTER — Other Ambulatory Visit: Payer: Self-pay | Admitting: Oncology

## 2020-08-14 ENCOUNTER — Ambulatory Visit (HOSPITAL_COMMUNITY)
Admission: RE | Admit: 2020-08-14 | Discharge: 2020-08-14 | Disposition: A | Discharge status: Home / Self Care | Payer: Medicare Other | Source: Ambulatory Visit | Attending: Student | Admitting: Student

## 2020-08-14 ENCOUNTER — Ambulatory Visit (HOSPITAL_COMMUNITY)
Admission: RE | Admit: 2020-08-14 | Discharge: 2020-08-14 | Disposition: A | Discharge status: Home / Self Care | Payer: Medicare Other | Source: Ambulatory Visit | Attending: Oncology | Admitting: Oncology

## 2020-08-14 ENCOUNTER — Other Ambulatory Visit: Payer: Self-pay

## 2020-08-14 DIAGNOSIS — Z9889 Other specified postprocedural states: Secondary | ICD-10-CM

## 2020-08-14 DIAGNOSIS — C3490 Malignant neoplasm of unspecified part of unspecified bronchus or lung: Secondary | ICD-10-CM

## 2020-08-14 DIAGNOSIS — J90 Pleural effusion, not elsewhere classified: Secondary | ICD-10-CM | POA: Diagnosis present

## 2020-08-14 HISTORY — PX: IR THORACENTESIS ASP PLEURAL SPACE W/IMG GUIDE: IMG5380

## 2020-08-14 MED ORDER — LIDOCAINE HCL 1 % IJ SOLN
INTRAMUSCULAR | Status: AC | PRN
  Administered 2020-08-14: 20 mL via INTRADERMAL

## 2020-08-14 MED ORDER — LIDOCAINE HCL 1 % IJ SOLN
INTRAMUSCULAR | Status: AC
  Filled 2020-08-14: qty 20

## 2020-08-14 NOTE — Procedures (Signed)
PROCEDURE SUMMARY:  Successful US guided therapeutic right thoracentesis. Yielded 550 mL of yellow fluid. Pt tolerated procedure well, however procedure stopped prior to removal of all fluid due to patient complaining of chest tightness. No immediate complications.  Specimen was not sent for labs. CXR ordered.  EBL < 5 mL  Docia Barrier PA-C 08/14/2020 1:35 PM

## 2020-08-15 ENCOUNTER — Encounter: Payer: Self-pay | Admitting: Nurse Practitioner

## 2020-08-16 ENCOUNTER — Other Ambulatory Visit: Payer: Self-pay | Admitting: *Deleted

## 2020-08-16 MED ORDER — SODIUM CHLORIDE 1 G PO TABS: 1.0000 g | ORAL_TABLET | Freq: Two times a day (BID) | ORAL | 0 refills | Status: DC

## 2020-08-16 NOTE — Progress Notes (Signed)
Mychart message from daughter requesting refill on Na+ tablets

## 2020-08-16 NOTE — Progress Notes (Incomplete)
  Radiation Oncology         [phone] ________________________________  Name: Mary Merritt MRN: [id number]  Date of Service: 08/28/2020  DOB: October 08, 1927  Post Treatment Telephone Note  Diagnosis:   C34.11-Malignant neoplasm of upper lobe, right bronchus or lung  Interval Since Last Radiation:  4 weeks  Radiation Treatment Dates: 07/11/2020 through 07/31/2020 Site Technique Total Dose (Gy) Dose per Fx (Gy) Completed Fx Beam Energies  Lung, Right: Lung_Rt 3D 37.5/37.5 2.5 15/15 6X, 10X      Narrative:  The patient was contacted today for routine follow-up. During treatment she did very well with radiotherapy and did not have significant desquamation. She reports she ***.  Impression/Plan: 1. Malignant neoplasm of upper lobe, right bronchus or lung. The patient has been doing well since completion of radiotherapy. We discussed that we would be happy to continue to follow her as needed, but she will also continue to follow up with Dr. Benay Spice in medical oncology. She was counseled on skin care as well as measures to avoid sun exposure to this area.  2. Survivorship. We discussed the importance of survivorship evaluation and encouraged her to attend her upcoming visit with that clinic.     Loma Messing, LPN

## 2020-08-20 ENCOUNTER — Other Ambulatory Visit: Payer: Self-pay | Admitting: Oncology

## 2020-08-23 ENCOUNTER — Telehealth: Payer: Self-pay | Admitting: *Deleted

## 2020-08-23 NOTE — Telephone Encounter (Signed)
Called from home health nurse requesting verbal orders for the following: 1. Foam border dressing for sacral wound 2. Nursing to see 1/week x 4 weeks 3. Order for physical therapy 4. Humidifier for her home oxygen--nose dry and tender from oxygen at 3 l/min [place] (will need to fax to [place]). MD approved all orders. Will fax humidifier order once MD signs script.

## 2020-08-24 ENCOUNTER — Telehealth: Payer: Self-pay | Admitting: *Deleted

## 2020-08-24 NOTE — Telephone Encounter (Signed)
Call from PT requesting orders to continue her home physical therapy for 2/week for 3 weeks and weekly for 2 weeks. OK per Dr. Benay Spice.

## 2020-08-28 ENCOUNTER — Inpatient Hospital Stay
Admission: RE | Admit: 2020-08-28 | Discharge: 2020-08-28 | Disposition: A | Discharge status: Home / Self Care | Payer: Medicare Other | Source: Ambulatory Visit

## 2020-08-28 DIAGNOSIS — C3490 Malignant neoplasm of unspecified part of unspecified bronchus or lung: Secondary | ICD-10-CM

## 2020-08-30 ENCOUNTER — Other Ambulatory Visit: Payer: Self-pay

## 2020-08-30 ENCOUNTER — Inpatient Hospital Stay: Payer: Medicare Other | Attending: Nurse Practitioner

## 2020-08-30 ENCOUNTER — Other Ambulatory Visit: Payer: Self-pay | Admitting: *Deleted

## 2020-08-30 ENCOUNTER — Inpatient Hospital Stay: Payer: Medicare Other

## 2020-08-30 ENCOUNTER — Inpatient Hospital Stay (HOSPITAL_BASED_OUTPATIENT_CLINIC_OR_DEPARTMENT_OTHER): Payer: Medicare Other | Admitting: Oncology

## 2020-08-30 VITALS — BP 153/80 | HR 74 | Temp 97.8°F | Resp 20 | Ht 65.0 in | Wt 141.9 lb

## 2020-08-30 DIAGNOSIS — D649 Anemia, unspecified: Secondary | ICD-10-CM | POA: Diagnosis not present

## 2020-08-30 DIAGNOSIS — J910 Malignant pleural effusion: Secondary | ICD-10-CM | POA: Insufficient documentation

## 2020-08-30 DIAGNOSIS — C3490 Malignant neoplasm of unspecified part of unspecified bronchus or lung: Secondary | ICD-10-CM

## 2020-08-30 DIAGNOSIS — Z79899 Other long term (current) drug therapy: Secondary | ICD-10-CM | POA: Insufficient documentation

## 2020-08-30 DIAGNOSIS — D472 Monoclonal gammopathy: Secondary | ICD-10-CM | POA: Diagnosis not present

## 2020-08-30 DIAGNOSIS — Z5112 Encounter for antineoplastic immunotherapy: Secondary | ICD-10-CM | POA: Insufficient documentation

## 2020-08-30 DIAGNOSIS — E871 Hypo-osmolality and hyponatremia: Secondary | ICD-10-CM | POA: Diagnosis not present

## 2020-08-30 DIAGNOSIS — C3411 Malignant neoplasm of upper lobe, right bronchus or lung: Secondary | ICD-10-CM | POA: Diagnosis present

## 2020-08-30 DIAGNOSIS — D75839 Thrombocytosis, unspecified: Secondary | ICD-10-CM | POA: Insufficient documentation

## 2020-08-30 DIAGNOSIS — Z5111 Encounter for antineoplastic chemotherapy: Secondary | ICD-10-CM | POA: Diagnosis present

## 2020-08-30 LAB — CBC WITH DIFFERENTIAL (CANCER CENTER ONLY)
Abs Immature Granulocytes: 0.07 10*3/uL (ref 0.00–0.07)
Basophils Absolute: 0 10*3/uL (ref 0.0–0.1)
Basophils Relative: 0 %
Eosinophils Absolute: 0.1 10*3/uL (ref 0.0–0.5)
Eosinophils Relative: 1 %
HCT: 32.4 % — ABNORMAL LOW (ref 36.0–46.0)
Hemoglobin: 10.1 g/dL — ABNORMAL LOW (ref 12.0–15.0)
Immature Granulocytes: 1 %
Lymphocytes Relative: 11 %
Lymphs Abs: 0.7 10*3/uL (ref 0.7–4.0)
MCH: 27.4 pg (ref 26.0–34.0)
MCHC: 31.2 g/dL (ref 30.0–36.0)
MCV: 88 fL (ref 80.0–100.0)
Monocytes Absolute: 0.6 10*3/uL (ref 0.1–1.0)
Monocytes Relative: 9 %
Neutro Abs: 5.4 10*3/uL (ref 1.7–7.7)
Neutrophils Relative %: 78 %
Platelet Count: 462 10*3/uL — ABNORMAL HIGH (ref 150–400)
RBC: 3.68 MIL/uL — ABNORMAL LOW (ref 3.87–5.11)
RDW: 16.6 % — ABNORMAL HIGH (ref 11.5–15.5)
WBC Count: 6.9 10*3/uL (ref 4.0–10.5)
nRBC: 0 % (ref 0.0–0.2)

## 2020-08-30 LAB — CMP (CANCER CENTER ONLY)
ALT: 9 U/L (ref 0–44)
AST: 16 U/L (ref 15–41)
Albumin: 2.5 g/dL — ABNORMAL LOW (ref 3.5–5.0)
Alkaline Phosphatase: 91 U/L (ref 38–126)
Anion gap: 10 (ref 5–15)
BUN: 8 mg/dL (ref 8–23)
CO2: 31 mmol/L (ref 22–32)
Calcium: 9.2 mg/dL (ref 8.9–10.3)
Chloride: 95 mmol/L — ABNORMAL LOW (ref 98–111)
Creatinine: 0.62 mg/dL (ref 0.44–1.00)
GFR, Estimated: >60 mL/min (ref >60)
Glucose, Bld: 105 mg/dL — ABNORMAL HIGH (ref 70–99)
Potassium: 3.8 mmol/L (ref 3.5–5.1)
Sodium: 136 mmol/L (ref 135–145)
Total Bilirubin: 0.3 mg/dL (ref 0.3–1.2)
Total Protein: 6.4 g/dL — ABNORMAL LOW (ref 6.5–8.1)

## 2020-08-30 MED ORDER — PALONOSETRON HCL INJECTION 0.25 MG/5ML
INTRAVENOUS | Status: AC
  Filled 2020-08-30: qty 5

## 2020-08-30 MED ORDER — PALONOSETRON HCL INJECTION 0.25 MG/5ML
0.2500 mg | Freq: Once | INTRAVENOUS | Status: AC
  Administered 2020-08-30: 0.25 mg via INTRAVENOUS

## 2020-08-30 MED ORDER — SODIUM CHLORIDE 0.9 % IV SOLN
1200.0000 mg | Freq: Once | INTRAVENOUS | Status: AC
  Administered 2020-08-30: 1200 mg via INTRAVENOUS
  Filled 2020-08-30: qty 20

## 2020-08-30 MED ORDER — HEPARIN SOD (PORK) LOCK FLUSH 100 UNIT/ML IV SOLN
500.0000 [IU] | Freq: Once | INTRAVENOUS | Status: DC | PRN
  Filled 2020-08-30: qty 5

## 2020-08-30 MED ORDER — SODIUM CHLORIDE 0.9% FLUSH
10.0000 mL | INTRAVENOUS | Status: DC | PRN
Start: 2020-08-30 — End: 2020-08-30
  Filled 2020-08-30: qty 10

## 2020-08-30 MED ORDER — SODIUM CHLORIDE 0.9 % IV SOLN
60.0000 mg/m2 | Freq: Once | INTRAVENOUS | Status: AC
  Administered 2020-08-30: 110 mg via INTRAVENOUS
  Filled 2020-08-30: qty 5.5

## 2020-08-30 MED ORDER — SODIUM CHLORIDE 0.9 % IV SOLN
Freq: Once | INTRAVENOUS | Status: AC
  Filled 2020-08-30: qty 250

## 2020-08-30 MED ORDER — SODIUM CHLORIDE 0.9 % IV SOLN
262.8000 mg | Freq: Once | INTRAVENOUS | Status: AC
  Administered 2020-08-30: 260 mg via INTRAVENOUS
  Filled 2020-08-30: qty 26

## 2020-08-30 MED ORDER — SODIUM CHLORIDE 0.9 % IV SOLN
10.0000 mg | Freq: Once | INTRAVENOUS | Status: AC
  Administered 2020-08-30: 10 mg via INTRAVENOUS
  Filled 2020-08-30: qty 10

## 2020-08-30 MED ORDER — SODIUM CHLORIDE 0.9 % IV SOLN
150.0000 mg | Freq: Once | INTRAVENOUS | Status: AC
  Administered 2020-08-30: 150 mg via INTRAVENOUS
  Filled 2020-08-30: qty 150

## 2020-08-30 NOTE — Patient Instructions (Signed)
[facility] Discharge Instructions for Patients Receiving Chemotherapy  Today you received the following chemotherapy agents carboplatin, etoposide, tecentriq   To help prevent nausea and vomiting after your treatment, we encourage you to take your nausea medication as directed   If you develop nausea and vomiting that is not controlled by your nausea medication, call the clinic.   BELOW ARE SYMPTOMS THAT SHOULD BE REPORTED IMMEDIATELY:  *FEVER GREATER THAN 100.5 F  *CHILLS WITH OR WITHOUT FEVER  NAUSEA AND VOMITING THAT IS NOT CONTROLLED WITH YOUR NAUSEA MEDICATION  *UNUSUAL SHORTNESS OF BREATH  *UNUSUAL BRUISING OR BLEEDING  TENDERNESS IN MOUTH AND THROAT WITH OR WITHOUT PRESENCE OF ULCERS  *URINARY PROBLEMS  *BOWEL PROBLEMS  UNUSUAL RASH Items with * indicate a potential emergency and should be followed up as soon as possible.  Feel free to call the clinic should you have any questions or concerns. The clinic phone number is [phone].  Please show the [place] at check-in to the Emergency Department and triage nurse.

## 2020-08-30 NOTE — Progress Notes (Signed)
[facility] OFFICE PROGRESS NOTE   Diagnosis: Small cell lung cancer  INTERVAL HISTORY:   Mary Merritt returns as scheduled.  She is here with her daughter.  She continues to have exertional dyspnea.  She has discomfort at the right upper anterior chest and shoulder region.  The discomfort is not relieved with Tylenol. She is ambulatory in the home.  She is capable of self-care.  She last underwent a thoracentesis for 550 cc of fluid on 08/14/2020 and reports feeling better after the thoracentesis. Objective:  Vital signs in last 24 hours:  Blood pressure (!) 153/80, pulse 74, temperature 97.8 F (36.6 C), temperature source Tympanic, resp. rate 20, height 5\' 5"  (1.651 m), weight 141 lb 14.4 oz (64.4 kg), SpO2 100 %.    HEENT: No thrush or ulcers Resp: Decreased breath sounds with scattered rhonchi throughout the right chest, no respiratory distress Cardio: Regular rate and rhythm GI: No hepatomegaly Vascular: Trace pitting edema at the left lower leg and ankle      Lab Results:  Lab Results  Component Value Date   WBC 6.9 08/30/2020   HGB 10.1 (L) 08/30/2020   HCT 32.4 (L) 08/30/2020   MCV 88.0 08/30/2020   PLT 462 (H) 08/30/2020   NEUTROABS 5.4 08/30/2020    CMP  Lab Results  Component Value Date   NA 136 08/30/2020   K 3.8 08/30/2020   CL 95 (L) 08/30/2020   CO2 31 08/30/2020   GLUCOSE 105 (H) 08/30/2020   BUN 8 08/30/2020   CREATININE 0.62 08/30/2020   CALCIUM 9.2 08/30/2020   PROT 6.4 (L) 08/30/2020   ALBUMIN 2.5 (L) 08/30/2020   AST 16 08/30/2020   ALT 9 08/30/2020   ALKPHOS 91 08/30/2020   BILITOT 0.3 08/30/2020   GFRNONAA >60 08/30/2020   GFRAA 53 (L) 12/28/2018    Medications: I have reviewed the patient's current medications.   Assessment/Plan: 1. Right lung mass concerning for malignancy -05/15/2020-CT chest with contrast showed a 7.2 x 5.9 cm right upper lobe lung mass with invasion into the right hilum and mediastinum,  extensive mediastinal and right hilar lymphadenopathy, extensive interstitial spread of tumor in the right lung and large loculated right pleural fluid collection likely secondary to pleural metastatic disease, scattered pulmonary nodules in the left lung suspicious for metastatic disease, 13 x 10.5 mm spiculated lesion in the lingula which could be a second primary. -Cytology on pleural fluid 11/23/2021positivefor malignant cells;cluster of cells positive forMOC31, TTF-1,synaptophysinand CD 56; possibility of small cell carcinoma raised -Cytology on pleural fluid 05/26/2020-malignant cells present with immunophenotype raising possibility of small cell carcinoma -Biopsy right supraclavicular lymph node 06/09/2020-small cell carcinoma -Cycle 1 modified Carboplatin/Etoposide plus atezolizumab 06/14/2020 -CT chest 07/07/2020-large tumor in the right chest surrounding the right mainstem bronchus and narrowing the right upper lobe bronchus, unchanged large prevascular node -Palliative radiation to the right chest beginning 07/11/2020-07/31/2020 -Cycle 2 modified Carboplatin/Etoposide plus atezolizumab 08/02/2020 -Cycle 3 modified carboplatin/etoposide plus atezolizumab 08/30/2020 2. Pleural effusion-malignant 3.Postobstructive pneumonia? 4. Hyponatremia 5. Mild normocytic anemia 6. Thrombocytosis 7. Hypertension 8. AAA status post repair in 2009 9. PAD 10. Hyperlipidemia 11. Depression  12. 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 Hospital admission 07/07/2020-dyspnea secondary to lung tumor and pleural effusion     Disposition: Mary Merritt appears stable.  She will complete another treatment with etoposide/carboplatin and atezolizumab today.  She has tolerated the treatment well to date.  She has not required a repeat thoracentesis since 08/14/2020.  She will use Tylenol and  ibuprofen as needed for pain.  We made a referral to the Cancer center nutritionist.  Mary Merritt will return  for an office visit in 2 weeks.  She will be referred for a restaging chest CT prior to the next cycle of systemic therapy.  Mary Coder, MD  08/30/2020  12:29 PM

## 2020-08-31 ENCOUNTER — Inpatient Hospital Stay: Payer: Medicare Other

## 2020-08-31 ENCOUNTER — Other Ambulatory Visit: Payer: Self-pay

## 2020-08-31 ENCOUNTER — Inpatient Hospital Stay: Payer: Medicare Other | Admitting: Nutrition

## 2020-08-31 ENCOUNTER — Telehealth: Payer: Self-pay | Admitting: Oncology

## 2020-08-31 VITALS — BP 132/68 | HR 89 | Temp 97.7°F | Resp 24

## 2020-08-31 DIAGNOSIS — C3490 Malignant neoplasm of unspecified part of unspecified bronchus or lung: Secondary | ICD-10-CM

## 2020-08-31 DIAGNOSIS — Z5112 Encounter for antineoplastic immunotherapy: Secondary | ICD-10-CM | POA: Diagnosis not present

## 2020-08-31 MED ORDER — SODIUM CHLORIDE 0.9 % IV SOLN
10.0000 mg | Freq: Once | INTRAVENOUS | Status: AC
  Administered 2020-08-31: 10 mg via INTRAVENOUS
  Filled 2020-08-31: qty 10

## 2020-08-31 MED ORDER — SODIUM CHLORIDE 0.9 % IV SOLN
60.0000 mg/m2 | Freq: Once | INTRAVENOUS | Status: AC
  Administered 2020-08-31: 110 mg via INTRAVENOUS
  Filled 2020-08-31: qty 5.5

## 2020-08-31 MED ORDER — SODIUM CHLORIDE 0.9 % IV SOLN
Freq: Once | INTRAVENOUS | Status: AC
  Filled 2020-08-31: qty 250

## 2020-08-31 NOTE — Telephone Encounter (Signed)
Scheduled appointments per 3/9 los. Spoke to patient who is aware of appointments date and times.  

## 2020-08-31 NOTE — Progress Notes (Signed)
Urgent referral received from MD.  87 year old female diagnosed with small cell lung cancer.  She is followed by Dr. Benay Spice.  She is receiving etoposide.  Past medical history includes hypertension, hyperlipidemia, arthritis, anxiety, and AAA.  Medications include magnesium, multivitamin, fish oil, Zofran, MiraLAX, Compazine, and Carafate.  Labs were reviewed.  Height: 65 inches. Weight: 141.9 pounds on March 9. Usual body weight: 161 pounds in January 2021. Patient weighed 156 pounds in June 2021. BMI: 23.61.  Patient status post thoracentesis on February 21 removing 550 cc of fluid. Patient reports her appetite has decreased and she is eating small amounts.  She tries to drink 1 protein drink a day.  She has some constipation.  She has felt weaker than usual.  I also spoke to her daughter on the phone who reports patient eats a fig bar or oatmeal for breakfast and cottage cheese and fruit for lunch.  She has been consuming Premier protein once daily.  She will drink juices but not much milk.  Nutrition diagnosis: Unintended weight loss related to inadequate oral intake as evidenced by 12% weight loss from usual body weight.  Intervention: Educated on strategies for increasing calories and protein in small frequent meals and snacks. Provided snack suggestions. Recommended Ensure complete or Ensure Plus/Enlive or equivalent instead of Premier protein for added calories. Brief education provided for constipation. Provided multiple fact sheets.  Samples were provided.  Questions were answered and teach back method used.  Contact information given.  Monitoring, evaluation, goals: Patient will tolerate increased calories and protein to minimize further weight loss and improved quality of life.  Next visit: To be scheduled as needed.  **Disclaimer: This note was dictated with voice recognition software. Similar sounding words can inadvertently be transcribed and this note may contain  transcription errors which may not have been corrected upon publication of note.**

## 2020-08-31 NOTE — Patient Instructions (Signed)
[facility] Discharge Instructions for Patients Receiving Chemotherapy  Today you received the following chemotherapy agents: etoposide  To help prevent nausea and vomiting after your treatment, we encourage you to take your nausea medication as directed.   If you develop nausea and vomiting that is not controlled by your nausea medication, call the clinic.   BELOW ARE SYMPTOMS THAT SHOULD BE REPORTED IMMEDIATELY:  *FEVER GREATER THAN 100.5 F  *CHILLS WITH OR WITHOUT FEVER  NAUSEA AND VOMITING THAT IS NOT CONTROLLED WITH YOUR NAUSEA MEDICATION  *UNUSUAL SHORTNESS OF BREATH  *UNUSUAL BRUISING OR BLEEDING  TENDERNESS IN MOUTH AND THROAT WITH OR WITHOUT PRESENCE OF ULCERS  *URINARY PROBLEMS  *BOWEL PROBLEMS  UNUSUAL RASH Items with * indicate a potential emergency and should be followed up as soon as possible.  Feel free to call the clinic should you have any questions or concerns. The clinic phone number is [phone].  Please show the [facility] CARD at check-in to the Emergency Department and triage nurse.   

## 2020-09-06 ENCOUNTER — Other Ambulatory Visit: Payer: Self-pay | Admitting: Internal Medicine

## 2020-09-06 ENCOUNTER — Other Ambulatory Visit: Payer: Self-pay | Admitting: Oncology

## 2020-09-08 ENCOUNTER — Telehealth: Payer: Self-pay | Admitting: *Deleted

## 2020-09-08 NOTE — Telephone Encounter (Signed)
Requesting verbal order to approve one more nursing visit in home to wrap up her care plan and review w/patient and family. OK per Dr. Benay Spice.

## 2020-09-13 ENCOUNTER — Telehealth: Payer: Self-pay | Admitting: *Deleted

## 2020-09-13 NOTE — Telephone Encounter (Signed)
Per Dr. Benay Spice: Lovastatin is a non-essential medication in regards to her current health condition. However, if patient wishes to continue this he will refill.  Left message w/daughter to call back with patient's decision.

## 2020-09-13 NOTE — Telephone Encounter (Addendum)
Needs refill on her lovastatin 20 mg, but Dr. Debara Pickett has declined to refill due to not being in office for > 1 year. Asking if Dr. Benay Spice will agree to refill this or do they need to f/u with cardiology?

## 2020-09-14 ENCOUNTER — Inpatient Hospital Stay (HOSPITAL_BASED_OUTPATIENT_CLINIC_OR_DEPARTMENT_OTHER): Payer: Medicare Other | Admitting: Oncology

## 2020-09-14 ENCOUNTER — Inpatient Hospital Stay: Payer: Medicare Other

## 2020-09-14 ENCOUNTER — Other Ambulatory Visit: Payer: Self-pay

## 2020-09-14 VITALS — BP 146/73 | HR 83 | Temp 98.1°F | Resp 17 | Ht 65.0 in | Wt 143.6 lb

## 2020-09-14 DIAGNOSIS — Z5112 Encounter for antineoplastic immunotherapy: Secondary | ICD-10-CM | POA: Diagnosis not present

## 2020-09-14 DIAGNOSIS — C3490 Malignant neoplasm of unspecified part of unspecified bronchus or lung: Secondary | ICD-10-CM

## 2020-09-14 LAB — CBC WITH DIFFERENTIAL (CANCER CENTER ONLY)
Abs Immature Granulocytes: 0.02 10*3/uL (ref 0.00–0.07)
Basophils Absolute: 0 10*3/uL (ref 0.0–0.1)
Basophils Relative: 1 %
Eosinophils Absolute: 0 10*3/uL (ref 0.0–0.5)
Eosinophils Relative: 1 %
HCT: 27.5 % — ABNORMAL LOW (ref 36.0–46.0)
Hemoglobin: 8.7 g/dL — ABNORMAL LOW (ref 12.0–15.0)
Immature Granulocytes: 1 %
Lymphocytes Relative: 19 %
Lymphs Abs: 0.5 10*3/uL — ABNORMAL LOW (ref 0.7–4.0)
MCH: 28.1 pg (ref 26.0–34.0)
MCHC: 31.6 g/dL (ref 30.0–36.0)
MCV: 88.7 fL (ref 80.0–100.0)
Monocytes Absolute: 0.5 10*3/uL (ref 0.1–1.0)
Monocytes Relative: 21 %
Neutro Abs: 1.4 10*3/uL — ABNORMAL LOW (ref 1.7–7.7)
Neutrophils Relative %: 57 %
Platelet Count: 295 10*3/uL (ref 150–400)
RBC: 3.1 MIL/uL — ABNORMAL LOW (ref 3.87–5.11)
RDW: 17.6 % — ABNORMAL HIGH (ref 11.5–15.5)
WBC Count: 2.4 10*3/uL — ABNORMAL LOW (ref 4.0–10.5)
nRBC: 0 % (ref 0.0–0.2)

## 2020-09-14 LAB — BASIC METABOLIC PANEL - CANCER CENTER ONLY
Anion gap: 12 (ref 5–15)
BUN: 7 mg/dL — ABNORMAL LOW (ref 8–23)
CO2: 31 mmol/L (ref 22–32)
Calcium: 8.8 mg/dL — ABNORMAL LOW (ref 8.9–10.3)
Chloride: 96 mmol/L — ABNORMAL LOW (ref 98–111)
Creatinine: 0.58 mg/dL (ref 0.44–1.00)
GFR, Estimated: >60 mL/min (ref >60)
Glucose, Bld: 94 mg/dL (ref 70–99)
Potassium: 3.9 mmol/L (ref 3.5–5.1)
Sodium: 139 mmol/L (ref 135–145)

## 2020-09-14 LAB — TSH: TSH: 5.829 u[IU]/mL — ABNORMAL HIGH (ref 0.308–3.960)

## 2020-09-14 NOTE — Progress Notes (Signed)
Mary Merritt   Diagnosis: Small cell lung cancer  INTERVAL HISTORY:   Mary Merritt returns as scheduled.  She is here with her son.  She completed another cycle of systemic therapy beginning on 08/30/2020.  She had malaise following chemotherapy.  No mouth sores or nausea/vomiting.  She continues to have exertional dyspnea.  She wears oxygen.  She last underwent a thoracentesis on 08/14/2020.  She did not have significant improvement in dyspnea following the thoracentesis.  Objective:  Vital signs in last 24 hours:  Blood pressure (!) 146/73, pulse 83, temperature 98.1 F (36.7 C), temperature source Tympanic, resp. rate 17, height 5\' 5"  (1.651 m), weight 143 lb 9.6 oz (65.1 kg), SpO2 100 %.    HEENT: No thrush or ulcers Resp: Decreased breath sounds at the right compared to the left chest, dullness at the right lower posterior chest, increased respiratory rate Cardio: Regular rate and rhythm GI: No hepatosplenomegaly Vascular: Trace pitting edema at the left greater than right lower leg and ankl     Lab Results:  Lab Results  Component Value Date   WBC 2.4 (L) 09/14/2020   HGB 8.7 (L) 09/14/2020   HCT 27.5 (L) 09/14/2020   MCV 88.7 09/14/2020   PLT 295 09/14/2020   NEUTROABS 1.4 (L) 09/14/2020    CMP  Lab Results  Component Value Date   NA 136 08/30/2020   K 3.8 08/30/2020   CL 95 (L) 08/30/2020   CO2 31 08/30/2020   GLUCOSE 105 (H) 08/30/2020   BUN 8 08/30/2020   CREATININE 0.62 08/30/2020   CALCIUM 9.2 08/30/2020   PROT 6.4 (L) 08/30/2020   ALBUMIN 2.5 (L) 08/30/2020   AST 16 08/30/2020   ALT 9 08/30/2020   ALKPHOS 91 08/30/2020   BILITOT 0.3 08/30/2020   GFRNONAA >60 08/30/2020   GFRAA 53 (L) 12/28/2018    Medications: I have reviewed the patient's current medications.   Assessment/Plan: Right lung mass concerning for malignancy -05/15/2020-CT chest with contrast showed a 7.2 x 5.9 cm right upper lobe lung mass with  invasion into the right hilum and mediastinum, extensive mediastinal and right hilar lymphadenopathy, extensive interstitial spread of tumor in the right lung and large loculated right pleural fluid collection likely secondary to pleural metastatic disease, scattered pulmonary nodules in the left lung suspicious for metastatic disease, 13 x 10.5 mm spiculated lesion in the lingula which could be a second primary. -Cytology on pleural fluid 11/23/2021positivefor malignant cells;cluster of cells positive forMOC31, TTF-1,synaptophysinand CD 56; possibility of small cell carcinoma raised -Cytology on pleural fluid 05/26/2020-malignant cells present with immunophenotype raising possibility of small cell carcinoma -Biopsy right supraclavicular lymph node 06/09/2020-small cell carcinoma -Cycle 1 modified Carboplatin/Etoposide plus atezolizumab 06/14/2020 -CT chest 07/07/2020-large tumor in the right chest surrounding the right mainstem bronchus and narrowing the right upper lobe bronchus, unchanged large prevascular node -Palliative radiation to the right chest beginning 07/11/2020-07/31/2020 -Cycle 2 modified Carboplatin/Etoposide plus atezolizumab 08/02/2020 -Cycle 3 modified carboplatin/etoposide plus atezolizumab 08/30/2020 2. Pleural effusion-malignant 3.Postobstructive pneumonia? 4. Hyponatremia 5. Mild normocytic anemia 6. Thrombocytosis 7. Hypertension 8. AAA status post repair in 2009 9. PAD 10. Hyperlipidemia 11. Depression  12. 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 Hospital admission 07/07/2020-dyspnea secondary to lung tumor and pleural effusion       Disposition: Ms. Foos has completed 3 cycles of etoposide/carboplatin plus atezolizumab.  Her clinical status appears partially improved compared to when she was here 2 weeks ago.  She will undergo a restaging chest  CT prior to an office visit on 09/27/2020.  The plan is to continue etoposide/carboplatin and atezolizumab  if the CT reveals improvement in the right lung tumor.  She will call for increased dyspnea in the interim.  She will discontinue the sodium tablet.  Betsy Coder, MD  09/14/2020  11:15 AM

## 2020-09-18 ENCOUNTER — Telehealth: Payer: Self-pay | Admitting: *Deleted

## 2020-09-18 ENCOUNTER — Telehealth: Payer: Self-pay | Admitting: Oncology

## 2020-09-18 ENCOUNTER — Telehealth: Payer: Self-pay

## 2020-09-18 ENCOUNTER — Other Ambulatory Visit: Payer: Self-pay | Admitting: Nurse Practitioner

## 2020-09-18 DIAGNOSIS — C3490 Malignant neoplasm of unspecified part of unspecified bronchus or lung: Secondary | ICD-10-CM

## 2020-09-18 MED ORDER — TRAMADOL HCL 50 MG PO TABS: 50.0000 mg | ORAL_TABLET | Freq: Two times a day (BID) | ORAL | 0 refills | Status: DC | PRN

## 2020-09-18 NOTE — Telephone Encounter (Signed)
Informed patient that per Dr. Benay Spice: OK to take Tramadol and alternate w/Tylenol or Ibuprofen. New script was sent .

## 2020-09-18 NOTE — Telephone Encounter (Signed)
Called pt to confirm appts on 4/6 - moved from 745 am to 945 - pt is aware.

## 2020-09-18 NOTE — Telephone Encounter (Signed)
[name] reports continued back pain, which was worse over the weekend.  She had some Tramadol left over from a hospital stay and has been taking this, alternating with Tylenol and Ibuprofen.  She wants to know if this is okay to take.  If so she will need a refill on Tramadol 50 mg tablets sent into [facility] on [address].

## 2020-09-25 ENCOUNTER — Telehealth: Payer: Self-pay

## 2020-09-25 NOTE — Telephone Encounter (Signed)
TC from Pt's daughter stating she has a transportation issue and would like to switch lab appointment to 09/26/20 at 2 pm lab appointment switched to 09/26/20 at 2pm.

## 2020-09-26 ENCOUNTER — Other Ambulatory Visit: Payer: Self-pay

## 2020-09-26 ENCOUNTER — Inpatient Hospital Stay: Payer: Medicare Other | Attending: Nurse Practitioner

## 2020-09-26 DIAGNOSIS — E785 Hyperlipidemia, unspecified: Secondary | ICD-10-CM | POA: Diagnosis not present

## 2020-09-26 DIAGNOSIS — I10 Essential (primary) hypertension: Secondary | ICD-10-CM | POA: Insufficient documentation

## 2020-09-26 DIAGNOSIS — Z79899 Other long term (current) drug therapy: Secondary | ICD-10-CM | POA: Diagnosis not present

## 2020-09-26 DIAGNOSIS — F32A Depression, unspecified: Secondary | ICD-10-CM | POA: Insufficient documentation

## 2020-09-26 DIAGNOSIS — F419 Anxiety disorder, unspecified: Secondary | ICD-10-CM | POA: Insufficient documentation

## 2020-09-26 DIAGNOSIS — E871 Hypo-osmolality and hyponatremia: Secondary | ICD-10-CM | POA: Insufficient documentation

## 2020-09-26 DIAGNOSIS — C782 Secondary malignant neoplasm of pleura: Secondary | ICD-10-CM | POA: Diagnosis not present

## 2020-09-26 DIAGNOSIS — D649 Anemia, unspecified: Secondary | ICD-10-CM | POA: Diagnosis not present

## 2020-09-26 DIAGNOSIS — D472 Monoclonal gammopathy: Secondary | ICD-10-CM | POA: Diagnosis not present

## 2020-09-26 DIAGNOSIS — D75839 Thrombocytosis, unspecified: Secondary | ICD-10-CM | POA: Diagnosis not present

## 2020-09-26 DIAGNOSIS — C3411 Malignant neoplasm of upper lobe, right bronchus or lung: Secondary | ICD-10-CM | POA: Diagnosis not present

## 2020-09-26 DIAGNOSIS — C3490 Malignant neoplasm of unspecified part of unspecified bronchus or lung: Secondary | ICD-10-CM

## 2020-09-26 LAB — CBC WITH DIFFERENTIAL (CANCER CENTER ONLY)
Abs Immature Granulocytes: 0.05 10*3/uL (ref 0.00–0.07)
Basophils Absolute: 0 10*3/uL (ref 0.0–0.1)
Basophils Relative: 0 %
Eosinophils Absolute: 0 10*3/uL (ref 0.0–0.5)
Eosinophils Relative: 0 %
HCT: 33 % — ABNORMAL LOW (ref 36.0–46.0)
Hemoglobin: 9.9 g/dL — ABNORMAL LOW (ref 12.0–15.0)
Immature Granulocytes: 1 %
Lymphocytes Relative: 8 %
Lymphs Abs: 0.7 10*3/uL (ref 0.7–4.0)
MCH: 27.3 pg (ref 26.0–34.0)
MCHC: 30 g/dL (ref 30.0–36.0)
MCV: 90.9 fL (ref 80.0–100.0)
Monocytes Absolute: 0.9 10*3/uL (ref 0.1–1.0)
Monocytes Relative: 10 %
Neutro Abs: 7.5 10*3/uL (ref 1.7–7.7)
Neutrophils Relative %: 81 %
Platelet Count: 382 10*3/uL (ref 150–400)
RBC: 3.63 MIL/uL — ABNORMAL LOW (ref 3.87–5.11)
RDW: 18 % — ABNORMAL HIGH (ref 11.5–15.5)
WBC Count: 9.3 10*3/uL (ref 4.0–10.5)
nRBC: 0 % (ref 0.0–0.2)

## 2020-09-26 LAB — CMP (CANCER CENTER ONLY)
ALT: 8 U/L (ref 0–44)
AST: 18 U/L (ref 15–41)
Albumin: 3.4 g/dL — ABNORMAL LOW (ref 3.5–5.0)
Alkaline Phosphatase: 67 U/L (ref 38–126)
Anion gap: 9 (ref 5–15)
BUN: 11 mg/dL (ref 8–23)
CO2: 37 mmol/L — ABNORMAL HIGH (ref 22–32)
Calcium: 9.3 mg/dL (ref 8.9–10.3)
Chloride: 87 mmol/L — ABNORMAL LOW (ref 98–111)
Creatinine: 0.56 mg/dL (ref 0.44–1.00)
GFR, Estimated: >60 mL/min (ref >60)
Glucose, Bld: 107 mg/dL — ABNORMAL HIGH (ref 70–99)
Potassium: 3.6 mmol/L (ref 3.5–5.1)
Sodium: 133 mmol/L — ABNORMAL LOW (ref 135–145)
Total Bilirubin: 0.3 mg/dL (ref 0.3–1.2)
Total Protein: 6.4 g/dL — ABNORMAL LOW (ref 6.5–8.1)

## 2020-09-27 ENCOUNTER — Other Ambulatory Visit: Payer: Medicare Other

## 2020-09-27 ENCOUNTER — Telehealth: Payer: Self-pay | Admitting: *Deleted

## 2020-09-27 ENCOUNTER — Inpatient Hospital Stay: Payer: Medicare Other

## 2020-09-27 ENCOUNTER — Other Ambulatory Visit: Payer: Self-pay | Admitting: Nurse Practitioner

## 2020-09-27 ENCOUNTER — Inpatient Hospital Stay: Payer: Medicare Other | Admitting: Oncology

## 2020-09-27 ENCOUNTER — Encounter: Payer: Self-pay | Admitting: Oncology

## 2020-09-27 ENCOUNTER — Ambulatory Visit (HOSPITAL_BASED_OUTPATIENT_CLINIC_OR_DEPARTMENT_OTHER)
Admission: RE | Admit: 2020-09-27 | Discharge: 2020-09-27 | Disposition: A | Discharge status: Home / Self Care | Payer: Medicare Other | Source: Ambulatory Visit | Attending: Oncology | Admitting: Oncology

## 2020-09-27 ENCOUNTER — Ambulatory Visit: Payer: Medicare Other

## 2020-09-27 ENCOUNTER — Ambulatory Visit: Payer: Medicare Other | Admitting: Nurse Practitioner

## 2020-09-27 DIAGNOSIS — I2510 Atherosclerotic heart disease of native coronary artery without angina pectoris: Secondary | ICD-10-CM | POA: Diagnosis not present

## 2020-09-27 DIAGNOSIS — J90 Pleural effusion, not elsewhere classified: Secondary | ICD-10-CM | POA: Diagnosis not present

## 2020-09-27 DIAGNOSIS — C3490 Malignant neoplasm of unspecified part of unspecified bronchus or lung: Secondary | ICD-10-CM | POA: Insufficient documentation

## 2020-09-27 DIAGNOSIS — I700 Atherosclerosis of aorta: Secondary | ICD-10-CM | POA: Insufficient documentation

## 2020-09-27 DIAGNOSIS — I517 Cardiomegaly: Secondary | ICD-10-CM | POA: Diagnosis not present

## 2020-09-27 MED ORDER — IOHEXOL 300 MG/ML  SOLN
75.0000 mL | Freq: Once | INTRAMUSCULAR | Status: AC | PRN
  Administered 2020-09-27: 75 mL via INTRAVENOUS

## 2020-09-27 MED ORDER — HYDROCODONE-ACETAMINOPHEN 5-325 MG PO TABS: 1.0000 | ORAL_TABLET | Freq: Four times a day (QID) | ORAL | 0 refills | Status: DC | PRN

## 2020-09-27 NOTE — Telephone Encounter (Signed)
Per Dr. Benay Spice :OK to order hydrocodone-apap for pain. Requesting she come in tomorrow at 2:15 and go over scan and make plans for next steps. She agrees to this plan as well. Will work on order for hospital bed tomorrow.

## 2020-09-27 NOTE — Telephone Encounter (Signed)
Daughter called to report she is in more pain--constant sharp pain that is in back and radiates to right side and abdomen. Tramadol bid is not helping. Asking about something stronger. Declines to come in today for OV and chemo. Will try to keep the CT scan appointment today at 3:30 pm. Also having more of a cough to point of gagging. Wants hospital bed ordered as well due to shortness of breath, even sleeping with two pillows does not help much. Bed is also too high for her--suggested she purchase a bunk board and remove the box spring for this issue.

## 2020-09-28 ENCOUNTER — Inpatient Hospital Stay: Payer: Medicare Other

## 2020-09-28 ENCOUNTER — Inpatient Hospital Stay (HOSPITAL_BASED_OUTPATIENT_CLINIC_OR_DEPARTMENT_OTHER): Payer: Medicare Other | Admitting: Nurse Practitioner

## 2020-09-28 ENCOUNTER — Encounter: Payer: Self-pay | Admitting: Nurse Practitioner

## 2020-09-28 ENCOUNTER — Other Ambulatory Visit: Payer: Self-pay

## 2020-09-28 VITALS — BP 146/71 | HR 86 | Temp 97.8°F | Resp 18 | Ht 65.0 in | Wt 142.0 lb

## 2020-09-28 DIAGNOSIS — C3490 Malignant neoplasm of unspecified part of unspecified bronchus or lung: Secondary | ICD-10-CM

## 2020-09-28 DIAGNOSIS — C3491 Malignant neoplasm of unspecified part of right bronchus or lung: Secondary | ICD-10-CM | POA: Diagnosis not present

## 2020-09-28 DIAGNOSIS — R0602 Shortness of breath: Secondary | ICD-10-CM | POA: Diagnosis not present

## 2020-09-28 MED ORDER — HYDROCOD POLST-CPM POLST ER 10-8 MG/5ML PO SUER: 5.0000 mL | Freq: Two times a day (BID) | ORAL | 0 refills | Status: AC | PRN

## 2020-09-28 NOTE — Progress Notes (Addendum)
[place] OFFICE PROGRESS NOTE   Diagnosis: Small cell lung cancer  INTERVAL HISTORY:   Mary Merritt returns for follow-up.  She has completed 3 cycles of Etoposide/carboplatin plus atezolizumab.  She is seen today to review the restaging chest CT from 09/27/2020.  She is having increased right low abdominal/back pain.  The pain worsened over the past weekend.  Ibuprofen and Tylenol have not been effective.  She recently began Vicodin with some relief.  Appetite is poor.  Energy level is poor.  She is more short of breath.  She is spending the majority of the day in bed.  She becomes dizzy with standing.  Objective:  Vital signs in last 24 hours:  Blood pressure (!) 146/71, pulse 86, temperature 97.8 F (36.6 C), temperature source Tympanic, resp. rate 18, height 5\' 5"  (1.651 m), weight 142 lb (64.4 kg), SpO2 96 %.    HEENT: No thrush or ulcers Resp: Breath sounds are diminished right lower lung field; rales right lung base.  Increased respiratory rate Cardio: Regular rate and rhythm. GI: Abdomen soft.  Tender at the right low abdomen.  No hepatomegaly.  No mass. Vascular: Trace edema right lower leg.  Skin: Decreased skin turgor.   Lab Results:  Lab Results  Component Value Date   WBC 9.3 09/26/2020   HGB 9.9 (L) 09/26/2020   HCT 33.0 (L) 09/26/2020   MCV 90.9 09/26/2020   PLT 382 09/26/2020   NEUTROABS 7.5 09/26/2020    Imaging:  No results found.  Medications: I have reviewed the patient's current medications.  Assessment/Plan: 1. Right lung mass concerning for malignancy -05/15/2020-CT chest with contrast showed a 7.2 x 5.9 cm right upper lobe lung mass with invasion into the right hilum and mediastinum, extensive mediastinal and right hilar lymphadenopathy, extensive interstitial spread of tumor in the right lung and large loculated right pleural fluid collection likely secondary to pleural metastatic disease, scattered pulmonary nodules in the left  lung suspicious for metastatic disease, 13 x 10.5 mm spiculated lesion in the lingula which could be a second primary. -Cytology on pleural fluid 11/23/2021positivefor malignant cells;cluster of cells positive forMOC31, TTF-1,synaptophysinand CD 56; possibility of small cell carcinoma raised -Cytology on pleural fluid 05/26/2020-malignant cells present with immunophenotype raising possibility of small cell carcinoma -Biopsy right supraclavicular lymph node 06/09/2020-small cell carcinoma -Cycle 1 modified Carboplatin/Etoposide plus atezolizumab 06/14/2020 -CT chest 07/07/2020-large tumor in the right chest surrounding the right mainstem bronchus and narrowing the right upper lobe bronchus, unchanged large prevascular node -Palliative radiation to the right chest beginning 07/11/2020-07/31/2020 -Cycle 2 modified Carboplatin/Etoposide plus atezolizumab 08/02/2020 -Cycle 3 modified carboplatin/etoposide plus atezolizumab 08/30/2020 -CTs 09/27/2020-significant interval enlargement of bulky prevascular/anterior mediastinal soft tissue mass which encases the left carotid and subclavian artery origins as well as the left brachiocephalic vein, numerous lower cervical, epicardial and upper abdominal lymph nodes.  Large right pleural effusion and associated atelectasis or consolidation significantly increased compared to prior study.  Discrete mass cannot be easily measured in the right lung.  Extensive nodular pleural thickening throughout the right hemithorax and to a lesser extent the left hemithorax.  Multiple new and enlarged pulmonary nodules at the left lung.  Nodule of the left adrenal gland.  Numerous new enlarged bulky lymph nodes and soft tissue nodules about the right adrenal gland, celiac axis and portacaval nodes. 2. Pleural effusion-malignant 3.Postobstructive pneumonia? 4. Hyponatremia 5. Mild normocytic anemia 6. Thrombocytosis 7. Hypertension 8. AAA status post repair in 2009 9.  PAD 10. Hyperlipidemia 11.  Depression  12. 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 Hospital admission 07/07/2020-dyspnea secondary to lung tumor and pleural effusion  Disposition: Mary Merritt has extensive stage small cell lung cancer.  She has completed 3 cycles of modified Carboplatin/Etoposide plus atezolizumab.  Her clinical status/performance status has significantly worsened.  CT scans from yesterday show marked disease progression.  Dr. Benay Spice reviewed the CT report/images with Mary Merritt and her family and recommends focusing on comfort/quality of life with a hospice referral.  They are in agreement.  We will make a referral to [facility].  CODE STATUS/end-of-life issues reviewed.  She will be placed on NO CODE BLUE status.  She is having increased pain at the right abdomen/back.  The pain is likely due to retroperitoneal adenopathy.  She will continue hydrocodone as needed.  She understands to contact the office if hydrocodone is not effective.  We are referring her for a therapeutic thoracentesis in the next few days.  She will return for follow-up in 3 weeks.  We are available to see her sooner if needed.  Patient seen with Dr. Benay Spice.    Ned Card ANP/GNP-BC   09/28/2020  2:30 PM  This was a shared visit with Ned Card.  Mary Merritt was interviewed and examined.  We reviewed the restaging CT images with Mary Merritt and her family.  There is clinical and radiologic evidence of disease progression following 3 cycles of systemic therapy.  We discussed comfort care versus a salvage systemic therapy regimen.  Mary Merritt does not wish to receive further chemotherapy.  She agrees to hospice care.  We discussed CPR and ACLS.  She will be placed on a no CODE BLUE status.  We will refer her for a palliative thoracentesis.  The back pain is likely related to tumor the right retroperitoneum.  I was present for greater than 50% of today's visit.  I perform medical decision  making.  Julieanne Manson, MD

## 2020-09-29 ENCOUNTER — Other Ambulatory Visit (HOSPITAL_COMMUNITY)
Admission: RE | Admit: 2020-09-29 | Discharge: 2020-09-29 | Disposition: A | Discharge status: Home / Self Care | Source: Ambulatory Visit | Attending: Oncology | Admitting: Oncology

## 2020-09-29 DIAGNOSIS — Z01812 Encounter for preprocedural laboratory examination: Secondary | ICD-10-CM | POA: Insufficient documentation

## 2020-09-29 DIAGNOSIS — Z20822 Contact with and (suspected) exposure to covid-19: Secondary | ICD-10-CM | POA: Insufficient documentation

## 2020-09-30 ENCOUNTER — Inpatient Hospital Stay (HOSPITAL_COMMUNITY)
Admission: EM | Admit: 2020-09-30 | Discharge: 2020-10-04 | DRG: 181 | Disposition: A | Discharge status: Hospice | Attending: Family Medicine | Admitting: Family Medicine

## 2020-09-30 ENCOUNTER — Other Ambulatory Visit: Payer: Self-pay | Admitting: Oncology

## 2020-09-30 ENCOUNTER — Other Ambulatory Visit: Payer: Self-pay

## 2020-09-30 ENCOUNTER — Observation Stay (HOSPITAL_COMMUNITY)

## 2020-09-30 ENCOUNTER — Encounter (HOSPITAL_COMMUNITY): Payer: Self-pay | Admitting: Emergency Medicine

## 2020-09-30 ENCOUNTER — Emergency Department (HOSPITAL_COMMUNITY)

## 2020-09-30 DIAGNOSIS — J9611 Chronic respiratory failure with hypoxia: Secondary | ICD-10-CM | POA: Diagnosis present

## 2020-09-30 DIAGNOSIS — Z885 Allergy status to narcotic agent status: Secondary | ICD-10-CM

## 2020-09-30 DIAGNOSIS — F419 Anxiety disorder, unspecified: Secondary | ICD-10-CM | POA: Diagnosis present

## 2020-09-30 DIAGNOSIS — E876 Hypokalemia: Secondary | ICD-10-CM | POA: Diagnosis present

## 2020-09-30 DIAGNOSIS — I10 Essential (primary) hypertension: Secondary | ICD-10-CM | POA: Diagnosis present

## 2020-09-30 DIAGNOSIS — J90 Pleural effusion, not elsewhere classified: Secondary | ICD-10-CM

## 2020-09-30 DIAGNOSIS — Z9981 Dependence on supplemental oxygen: Secondary | ICD-10-CM

## 2020-09-30 DIAGNOSIS — Z515 Encounter for palliative care: Secondary | ICD-10-CM

## 2020-09-30 DIAGNOSIS — E871 Hypo-osmolality and hyponatremia: Secondary | ICD-10-CM | POA: Diagnosis present

## 2020-09-30 DIAGNOSIS — J948 Other specified pleural conditions: Secondary | ICD-10-CM

## 2020-09-30 DIAGNOSIS — E782 Mixed hyperlipidemia: Secondary | ICD-10-CM | POA: Diagnosis present

## 2020-09-30 DIAGNOSIS — Z9221 Personal history of antineoplastic chemotherapy: Secondary | ICD-10-CM

## 2020-09-30 DIAGNOSIS — R0603 Acute respiratory distress: Secondary | ICD-10-CM | POA: Diagnosis not present

## 2020-09-30 DIAGNOSIS — Z79899 Other long term (current) drug therapy: Secondary | ICD-10-CM

## 2020-09-30 DIAGNOSIS — J910 Malignant pleural effusion: Secondary | ICD-10-CM | POA: Diagnosis present

## 2020-09-30 DIAGNOSIS — R0602 Shortness of breath: Secondary | ICD-10-CM

## 2020-09-30 DIAGNOSIS — Z66 Do not resuscitate: Secondary | ICD-10-CM | POA: Diagnosis present

## 2020-09-30 DIAGNOSIS — Z96643 Presence of artificial hip joint, bilateral: Secondary | ICD-10-CM | POA: Diagnosis present

## 2020-09-30 DIAGNOSIS — E8770 Fluid overload, unspecified: Secondary | ICD-10-CM | POA: Diagnosis present

## 2020-09-30 DIAGNOSIS — C3490 Malignant neoplasm of unspecified part of unspecified bronchus or lung: Secondary | ICD-10-CM

## 2020-09-30 DIAGNOSIS — R000 Tachycardia, unspecified: Secondary | ICD-10-CM | POA: Diagnosis present

## 2020-09-30 DIAGNOSIS — C3491 Malignant neoplasm of unspecified part of right bronchus or lung: Principal | ICD-10-CM | POA: Diagnosis present

## 2020-09-30 DIAGNOSIS — Z8249 Family history of ischemic heart disease and other diseases of the circulatory system: Secondary | ICD-10-CM

## 2020-09-30 DIAGNOSIS — J939 Pneumothorax, unspecified: Secondary | ICD-10-CM

## 2020-09-30 DIAGNOSIS — Z7982 Long term (current) use of aspirin: Secondary | ICD-10-CM

## 2020-09-30 DIAGNOSIS — L89152 Pressure ulcer of sacral region, stage 2: Secondary | ICD-10-CM | POA: Diagnosis present

## 2020-09-30 DIAGNOSIS — F32A Depression, unspecified: Secondary | ICD-10-CM | POA: Diagnosis present

## 2020-09-30 DIAGNOSIS — I440 Atrioventricular block, first degree: Secondary | ICD-10-CM | POA: Diagnosis present

## 2020-09-30 DIAGNOSIS — Z87891 Personal history of nicotine dependence: Secondary | ICD-10-CM

## 2020-09-30 DIAGNOSIS — L8990 Pressure ulcer of unspecified site, unspecified stage: Secondary | ICD-10-CM | POA: Insufficient documentation

## 2020-09-30 DIAGNOSIS — Z20822 Contact with and (suspected) exposure to covid-19: Secondary | ICD-10-CM | POA: Diagnosis present

## 2020-09-30 DIAGNOSIS — D630 Anemia in neoplastic disease: Secondary | ICD-10-CM | POA: Diagnosis present

## 2020-09-30 LAB — COMPREHENSIVE METABOLIC PANEL
ALT: 12 U/L (ref 0–44)
AST: 58 U/L — ABNORMAL HIGH (ref 15–41)
Albumin: 2.9 g/dL — ABNORMAL LOW (ref 3.5–5.0)
Alkaline Phosphatase: 72 U/L (ref 38–126)
Anion gap: 12 (ref 5–15)
BUN: 9 mg/dL (ref 8–23)
CO2: 33 mmol/L — ABNORMAL HIGH (ref 22–32)
Calcium: 8.6 mg/dL — ABNORMAL LOW (ref 8.9–10.3)
Chloride: 80 mmol/L — ABNORMAL LOW (ref 98–111)
Creatinine, Ser: 0.56 mg/dL (ref 0.44–1.00)
GFR, Estimated: >60 mL/min (ref >60)
Glucose, Bld: 114 mg/dL — ABNORMAL HIGH (ref 70–99)
Potassium: 4.7 mmol/L (ref 3.5–5.1)
Sodium: 125 mmol/L — ABNORMAL LOW (ref 135–145)
Total Bilirubin: 2.1 mg/dL — ABNORMAL HIGH (ref 0.3–1.2)
Total Protein: 6.2 g/dL — ABNORMAL LOW (ref 6.5–8.1)

## 2020-09-30 LAB — CBC WITH DIFFERENTIAL/PLATELET
Abs Immature Granulocytes: 0.05 10*3/uL (ref 0.00–0.07)
Basophils Absolute: 0 10*3/uL (ref 0.0–0.1)
Basophils Relative: 0 %
Eosinophils Absolute: 0 10*3/uL (ref 0.0–0.5)
Eosinophils Relative: 0 %
HCT: 31.4 % — ABNORMAL LOW (ref 36.0–46.0)
Hemoglobin: 10 g/dL — ABNORMAL LOW (ref 12.0–15.0)
Immature Granulocytes: 1 %
Lymphocytes Relative: 8 %
Lymphs Abs: 0.7 10*3/uL (ref 0.7–4.0)
MCH: 28.6 pg (ref 26.0–34.0)
MCHC: 31.8 g/dL (ref 30.0–36.0)
MCV: 89.7 fL (ref 80.0–100.0)
Monocytes Absolute: 0.9 10*3/uL (ref 0.1–1.0)
Monocytes Relative: 10 %
Neutro Abs: 7.1 10*3/uL (ref 1.7–7.7)
Neutrophils Relative %: 81 %
Platelets: 376 10*3/uL (ref 150–400)
RBC: 3.5 MIL/uL — ABNORMAL LOW (ref 3.87–5.11)
RDW: 17.9 % — ABNORMAL HIGH (ref 11.5–15.5)
WBC: 8.8 10*3/uL (ref 4.0–10.5)
nRBC: 0 % (ref 0.0–0.2)

## 2020-09-30 LAB — RESP PANEL BY RT-PCR (FLU A&B, COVID) ARPGX2
Influenza A by PCR: NEGATIVE
Influenza B by PCR: NEGATIVE
SARS Coronavirus 2 by RT PCR: NEGATIVE

## 2020-09-30 LAB — SARS CORONAVIRUS 2 (TAT 6-24 HRS): SARS Coronavirus 2: NEGATIVE

## 2020-09-30 LAB — LACTIC ACID, PLASMA: Lactic Acid, Venous: 1.6 mmol/L (ref 0.5–1.9)

## 2020-09-30 LAB — PROTIME-INR
INR: 1 (ref 0.8–1.2)
Prothrombin Time: 12.8 seconds (ref 11.4–15.2)

## 2020-09-30 MED ORDER — POLYETHYLENE GLYCOL 3350 17 G PO PACK
17.0000 g | PACK | Freq: Every day | ORAL | Status: DC | PRN
  Administered 2020-10-02 – 2020-10-03 (×2): 17 g via ORAL
  Filled 2020-09-30 (×2): qty 1

## 2020-09-30 MED ORDER — METOPROLOL TARTRATE 5 MG/5ML IV SOLN: 5.0000 mg | Freq: Four times a day (QID) | INTRAVENOUS | Status: DC | PRN

## 2020-09-30 MED ORDER — ESCITALOPRAM OXALATE 10 MG PO TABS
10.0000 mg | ORAL_TABLET | Freq: Every day | ORAL | Status: DC
  Administered 2020-09-30 – 2020-10-04 (×5): 10 mg via ORAL
  Filled 2020-09-30 (×5): qty 1

## 2020-09-30 MED ORDER — ASPIRIN EC 81 MG PO TBEC
81.0000 mg | DELAYED_RELEASE_TABLET | Freq: Every day | ORAL | Status: DC
  Administered 2020-09-30 – 2020-10-04 (×5): 81 mg via ORAL
  Filled 2020-09-30 (×5): qty 1

## 2020-09-30 MED ORDER — HYDROCODONE-ACETAMINOPHEN 5-325 MG PO TABS: 1.0000 | ORAL_TABLET | Freq: Four times a day (QID) | ORAL | Status: DC | PRN

## 2020-09-30 MED ORDER — ENOXAPARIN SODIUM 40 MG/0.4ML ~~LOC~~ SOLN
40.0000 mg | SUBCUTANEOUS | Status: DC
  Administered 2020-09-30 – 2020-10-03 (×3): 40 mg via SUBCUTANEOUS
  Filled 2020-09-30 (×5): qty 0.4

## 2020-09-30 MED ORDER — LIDOCAINE HCL 1 % IJ SOLN
INTRAMUSCULAR | Status: AC
  Filled 2020-09-30: qty 20

## 2020-09-30 MED ORDER — METOPROLOL TARTRATE 25 MG PO TABS
12.5000 mg | ORAL_TABLET | Freq: Two times a day (BID) | ORAL | Status: DC
  Administered 2020-09-30 – 2020-10-04 (×9): 12.5 mg via ORAL
  Filled 2020-09-30 (×9): qty 1

## 2020-09-30 MED ORDER — MORPHINE SULFATE (PF) 2 MG/ML IV SOLN
1.0000 mg | INTRAVENOUS | Status: DC | PRN
Start: 2020-09-30 — End: 2020-10-01
  Administered 2020-09-30 – 2020-10-01 (×3): 1 mg via INTRAVENOUS
  Filled 2020-09-30 (×4): qty 1

## 2020-09-30 MED ORDER — MORPHINE SULFATE (PF) 2 MG/ML IV SOLN
1.0000 mg | Freq: Once | INTRAVENOUS | Status: AC
  Administered 2020-09-30: 1 mg via INTRAVENOUS
  Filled 2020-09-30: qty 1

## 2020-09-30 MED ORDER — SODIUM CHLORIDE 0.9% FLUSH
3.0000 mL | Freq: Two times a day (BID) | INTRAVENOUS | Status: DC
  Administered 2020-09-30 – 2020-10-04 (×9): 3 mL via INTRAVENOUS

## 2020-09-30 NOTE — ED Notes (Signed)
Per pt daughter [name] will be going home. Requests phone call from RN or MD @ [phone] requests to be contacted for pt status/updates. [employer]

## 2020-09-30 NOTE — Progress Notes (Signed)
[facility])      This patient is a current hospice patient, admitted 09/29/20 with a terminal diagnosis of small cell lung cancer.  [facility] will continue to follow for any discharge planning needs and to coordinate continuation of hospice care.    Thank you for the opportunity to participate in this patient's care.     Domenic Moras, BSN, RN [facility] Liaison  [phone]  [phone] (24h on call)

## 2020-09-30 NOTE — Procedures (Signed)
Interventional Radiology Procedure Note  Procedure: US guided right thoracentesis.   ~900cc's removed. Due to discomfort, she asked [place] to stop the aspiration.   Findings:  US shows developing loculations/complexity  Complications: None  Recommendations:  - CXR now - routine wound care  Signed,  Dulcy Fanny. Earleen Newport, DO

## 2020-09-30 NOTE — ED Notes (Signed)
Pt daughter vicki is outside in car waiting. She states if we need her for anything to call her cell. ( number is on pt demographics)

## 2020-09-30 NOTE — H&P (Signed)
History and Physical        Hospital Admission Note Date: 09/30/2020  Patient name: Mary Merritt Medical record number: [id number] Date of birth: 1928-02-04 Age: 87 y.o. Gender: female  PCP: Katherina Mires, MD    Chief Complaint    Chief Complaint  Patient presents with  . 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 She was recently seen by her oncologist on 4/7 for review of her CT scans which unfortunately showed marked disease progression.  She was referred for hospice care and made DNR at the time.  She was also referred for palliative thoracentesis to be done on Monday however her symptoms became so severe last night that she presented to the ED before having this done.  She admits to epigastric pain with mildly productive cough without hemoptysis.  Denies fever, chills, night sweats, nausea or vomiting or any other complaints  ED Course: Afebrile, hypertensive, Placed on NRB. Notable Labs: Sodium 125, K4.7, chloride 80, CO2 30, glucose 114, BUN 9, creatinine 0.56, lactic acid 1.6, WBC 8.8, Hb 10.0, INR 1.0, COVID-19 and flu negative. Notable Imaging: CXR-large right pleural effusion with extensive right sided atelectasis, moderate left basilar atelectasis and/or infiltrate with small pleural effusion and findings consistent with known mediastinal soft tissue mass.   Vitals:   09/30/20 0700 09/30/20 0800  BP: (!) 170/117 (!) 165/99  Pulse: (!) 111 (!) 110  Resp: (!) 26 (!) 26  Temp:    SpO2: 97% 98%     Review of Systems:  Review of  Systems  All other systems reviewed and are negative.   Medical/Social/Family History   Past Medical History: Past Medical History:  Diagnosis Date  . AAA (abdominal aortic aneurysm) ([place])   . Anxiety   . Arthritis   . Dyspnea   . Dysrhythmia    bradycardia  . History of bronchitis   . Hyperlipidemia   . Hypertension   . Seasonal allergies     Past Surgical History:  Procedure Laterality Date  . ABDOMINAL AORTIC ANEURYSM REPAIR     2009, Dr. Scot Dock  . ABDOMINAL AORTOGRAM W/LOWER EXTREMITY Right 04/07/2020   Procedure: ABDOMINAL AORTOGRAM W/LOWER EXTREMITY;  Surgeon: Angelia Mould, MD;  Location: [place];  Service: Cardiovascular;  Laterality: Right;  . CARDIAC CATHETERIZATION     patient thinks it was in 09 with dr Scot Dock  . cataracts removed    . INCONTINENCE SURGERY    . IR THORACENTESIS ASP PLEURAL SPACE W/IMG GUIDE  06/07/2020  . IR THORACENTESIS ASP PLEURAL SPACE W/IMG GUIDE  06/14/2020  . IR THORACENTESIS ASP PLEURAL SPACE W/IMG GUIDE  07/24/2020  . IR THORACENTESIS ASP PLEURAL SPACE W/IMG GUIDE  08/14/2020  . THORACENTESIS N/A 05/16/2020   Procedure: Mathews Robinsons;  Surgeon: Lanier Clam, MD;  Location: [facility] ENDOS[facility];  Service: Pulmonary;  Laterality: N/A;  .  TOTAL HIP ARTHROPLASTY Right 08/01/2014   Procedure: RIGHT TOTAL HIP ARTHROPLASTY ANTERIOR APPROACH;  Surgeon: Marianna Payment, MD;  Location: [facility];  Service: Orthopedics;  Laterality: Right;  . TOTAL HIP ARTHROPLASTY Left 03/27/2016   Procedure: LEFT TOTAL HIP ARTHROPLASTY ANTERIOR APPROACH;  Surgeon: Leandrew Koyanagi, MD;  Location: [facility];  Service: Orthopedics;  Laterality: Left;    Medications: Prior to Admission medications   Medication Sig Start Date End Date Taking? Authorizing Provider  aspirin EC 81 MG tablet Take 81 mg by mouth daily.   Yes [provider]  diphenhydrAMINE (BENADRYL) 25 mg capsule Take 25 mg by mouth at bedtime as needed for sleep.   Yes [provider]  escitalopram (LEXAPRO) 10 MG tablet Take 10 mg by mouth daily.   Yes [provider]  feeding supplement (ENSURE ENLIVE / ENSURE PLUS) LIQD Take 237 mLs by mouth 3 (three) times daily between meals. 07/14/20  Yes Mercy Riding, MD  HYDROcodone-acetaminophen (NORCO) 5-325 MG tablet Take 1 tablet by mouth every 6 (six) hours as needed for moderate pain. 09/27/20  Yes Owens Shark, NP  ibuprofen (ADVIL) 200 MG tablet Take 400 mg by mouth every 4 (four) hours as needed for mild pain.   Yes [provider]  metoprolol tartrate (LOPRESSOR) 25 MG tablet Take 0.5 tablets (12.5 mg total) by mouth 2 (two) times daily. 12/27/19  Yes Hilty, Nadean Corwin, MD  polyethylene glycol (MIRALAX / GLYCOLAX) 17 g packet Take 17 g by mouth 2 (two) times daily as needed. Patient taking differently: Take 17 g by mouth 2 (two) times daily as needed for mild constipation. 07/14/20  Yes Mercy Riding, MD  chlorpheniramine-HYDROcodone (TUSSIONEX) 10-8 MG/5ML SUER Take 5 mLs by mouth every 12 (twelve) hours as needed for cough. 09/28/20   Owens Shark, NP    Allergies:   Allergies  Allergen Reactions  . Percocet [Oxycodone-Acetaminophen] Other (See Comments)    Makes head feel funny    Social History:  reports that she quit smoking about 36 years ago. She has a 30.00 pack-year smoking history. She has never used smokeless tobacco. She reports that she does not drink alcohol and does not use drugs.  Family History: Family History  Problem Relation Age of Onset  . Heart failure Father   . Stroke Mother      Objective   Physical Exam: Blood pressure (!) 165/99, pulse (!) 110, temperature 97.6 F (36.4 C), resp. rate (!) 26, SpO2 98 %.  Physical Exam Vitals and nursing note reviewed.  Constitutional:      Appearance: Normal appearance.  HENT:     Head: Normocephalic and atraumatic.  Eyes:     Conjunctiva/sclera: Conjunctivae normal.  Cardiovascular:     Rate and Rhythm: Regular  rhythm. Tachycardia present.  Pulmonary:     Effort: Pulmonary effort is normal.     Breath sounds: Examination of the right-upper field reveals decreased breath sounds and rales. Examination of the right-middle field reveals decreased breath sounds and rales. Examination of the right-lower field reveals decreased breath sounds and rales. Examination of the left-lower field reveals rales. Decreased breath sounds and rales present.  Abdominal:     General: Abdomen is flat.     Palpations: Abdomen is soft.  Musculoskeletal:        General: No swelling or tenderness.  Skin:    Coloration: Skin is not jaundiced or pale.  Neurological:     Mental Status: She is  alert. Mental status is at baseline.  Psychiatric:        Mood and Affect: Mood normal.        Behavior: Behavior normal.     LABS on Admission: I have personally reviewed all the labs and imaging below    Basic Metabolic Panel: Recent Labs  Lab 09/26/20 1417 09/30/20 0145  NA 133* 125*  K 3.6 4.7  CL 87* 80*  CO2 37* 33*  GLUCOSE 107* 114*  BUN 11 9  CREATININE 0.56 0.56  CALCIUM 9.3 8.6*   Liver Function Tests: Recent Labs  Lab 09/26/20 1417 09/30/20 0145  AST 18 58*  ALT 8 12  ALKPHOS 67 72  BILITOT 0.3 2.1*  PROT 6.4* 6.2*  ALBUMIN 3.4* 2.9*   No results for input(s): LIPASE, AMYLASE in the last 168 hours. No results for input(s): AMMONIA in the last 168 hours. CBC: Recent Labs  Lab 09/26/20 1417 09/30/20 0145  WBC 9.3 8.8  NEUTROABS 7.5 7.1  HGB 9.9* 10.0*  HCT 33.0* 31.4*  MCV 90.9 89.7  PLT 382 376   Cardiac Enzymes: No results for input(s): CKTOTAL, CKMB, CKMBINDEX, TROPONINI in the last 168 hours. BNP: Invalid input(s): POCBNP CBG: No results for input(s): GLUCAP in the last 168 hours.  Radiological Exams on Admission:  DG Chest 2 View  Result Date: 09/30/2020 CLINICAL DATA:  Shortness of breath x2 days. EXAM: CHEST - 2 VIEW COMPARISON:  August 14, 2020 FINDINGS: There is marked  severity opacification of the right hemithorax with a mild amount of aerated lung seen along the medial aspect of the mid and upper right lung. This is increased in severity when compared to the prior study. Moderate severity left basilar atelectasis and/or infiltrate is seen. There is a small left pleural effusion. No pneumothorax is identified. Mild to moderate severity enlargement of the cardiac silhouette is seen. An ill-defined lobulated opacity is seen along the superior mediastinum on the left. A chronic eighth right rib fracture is noted. IMPRESSION: 1. Large right pleural effusion with associated extensive right upper lobe, right middle lobe and right lower lobe atelectasis and/or infiltrate. 2. Moderate severity left basilar atelectasis and/or infiltrate with a small left pleural effusion. 3. Findings consistent with the patient's known prevascular/anterior mediastinal soft tissue mass. Electronically Signed   By: Virgina Norfolk M.D.   On: 09/30/2020 02:38      EKG: normal sinus rhythm, PAC's noted, left axis deviation, 1st degree AV block   A & P   Principal Problem:   Respiratory distress Active Problems:   Essential hypertension   Mixed hyperlipidemia   Hyponatremia   Small cell lung cancer (HCC)   Pleural effusion   1. Respiratory distress in the setting of chronic hypoxic respiratory failure secondary to recurrent malignant right pleural effusion and extensive right-sided and moderate left atelectasis  small cell lung cancer a. IR thoracentesis b. Pain management as needed c. Recently referred for hospice however has not had this set up just yet.  Palliative care consulted while inpatient to potentially help the process along  2. Sinus tachycardia  hypertension a. Continue home Lopressor  3. Hyponatremia a. Sodium 125, below baseline b. Likely from volume overload and possibly from poor p.o. intake c. Hold off on fluids for now to prevent further volume overload  and follow-up after thoracentesis  4. Hypertension a. As needed IV Lopressor  5. Anxiety a. Continue home Lexapro  6. Hyperlipidemia a. Not on home meds    DVT prophylaxis:  Lovenox   Code Status: DNR  Diet: N.p.o. pending procedure Family Communication: Admission, patients condition and plan of care including tests being ordered have been discussed with the patient who indicates understanding and agrees with the plan and Code Status. [name] was updated  Disposition Plan: The appropriate patient status for this patient is OBSERVATION. Observation status is judged to be reasonable and necessary in order to provide the required intensity of service to ensure the patient's safety. The patient's presenting symptoms, physical exam findings, and initial radiographic and laboratory data in the context of their medical condition is felt to place them at decreased risk for further clinical deterioration. Furthermore, it is anticipated that the patient will be medically stable for discharge from the hospital within 2 midnights of admission. The following factors support the patient status of observation.   " The patient's presenting symptoms include shortness of breath. " The physical exam findings include decreased right breath sounds with rales. " The initial radiographic and laboratory data are concerning for right pleural effusion.    Status is: Observation  The patient remains OBS appropriate and will d/c before 2 midnights.  Dispo: The patient is from: Home              Anticipated d/c is to: Home              Patient currently is not medically stable to d/c.   Difficult to place patient No         The medical decision making on this patient was of high complexity and the patient is at high risk for clinical deterioration, therefore this is a level 3  admission.  Consultants  . IR  Procedures  . None  Time Spent on Admission: 70 minutes    Harold Hedge,  DO [facility]  09/30/2020, 8:22 AM

## 2020-09-30 NOTE — ED Notes (Signed)
Patient transported to X-ray 

## 2020-09-30 NOTE — Progress Notes (Addendum)
Mary Merritt [address] [facility]) hospitalized hospice patient visit  Mary Merritt is a current hospice patient, admitted 09/29/20, with a terminal diagnosis of small cell lung cancer.  Family activated EMS after pt began to complain of severe shortness of breath with severe back pain.  Family contacted hospice before activating 911 but declined on call nurse visit in favor of transporting to emergency department.  The patient was admitted to [facility] on 09/30/20 with a diagnosis of respiratory distress. Per Dr. Konrad Dolores with [facility] this is a related hospital admission.  Visited patient at bedside in the emergency department; pt was holding for a bed on the floor at the time of visit.  Son Mary Merritt was at bedside and participated in the visit.  Pt  had undergone thoracentesis and had 900 cc removed per IR note.  Pt reports breathing improved after this and asked to go home.  Pt complained of back pain due to disease process compounded by twelve hours on the stretcher.  Pt reported that at home she would transfer into a recliner to relieve back pain.  Pt reports slight appetite, ate 50% of an ice pop; per son this is consistent with poor PO intake recently.  Report exchanged with RN and ED tech who very kindly assisted pt into a recliner.  Dr. Neysa Bonito made aware via secure chat of pt's wish to discharge home; per Dr. Neysa Bonito pt requires close monitoring s/p thoracentesis and would benefit from overnight hospitalization.  Pt and son verbalized understanding and agreement with this plan.    Patient remains appropriate for GIP due to need to reassess respiratory status s/p thoracentesis, likely with ultrasound or radiology, as well as need for IV pain medications.    Vital Signs: 97.55F oral, 125/81, 117 HR, 22 RR, 98% 4L [place] I&O's: not charted Abnormal Labs:  Na 125, Cl 80, CO2 33, Ca 8.6, Alb. 2.9, AST 58, tot prot 6.2, AST 58, tot bili 2.1, Hgb 10.0, TSH 5.829 Diagnostics:            DG Chest 2 View    Result Date: 09/30/2020 CLINICAL DATA:  Shortness of breath x2 days. EXAM: CHEST - 2 VIEW COMPARISON:  August 14, 2020 FINDINGS: There is marked severity opacification of the right hemithorax with a mild amount of aerated lung seen along the medial aspect of the mid and upper right lung. This is increased in severity when compared to the prior study. Moderate severity left basilar atelectasis and/or infiltrate is seen. There is a small left pleural effusion. No pneumothorax is identified. Mild to moderate severity enlargement of the cardiac silhouette is seen. An ill-defined lobulated opacity is seen along the superior mediastinum on the left. A chronic eighth right rib fracture is noted. IMPRESSION: 1. Large right pleural effusion with associated extensive right upper lobe, right middle lobe and right lower lobe atelectasis and/or infiltrate. 2. Moderate severity left basilar atelectasis and/or infiltrate with a small left pleural effusion. 3. Findings consistent with the patient's known prevascular/anterior mediastinal soft tissue mass. Electronically Signed   By: Virgina Norfolk M.D.   On: 09/30/2020 02:38             Interventional Radiology Procedure Note Procedure: US guided right thoracentesis.    ~900cc's removed. Due to discomfort, she asked [place] to stop the aspiration.    Findings:  US shows developing loculations/complexity   Complications: None   Recommendations:  - CXR now - routine wound care  IV/PRN meds:  morphine 46m PIV  q3h PRN pain (x1 dose), Vicodin 5-325 mg PO q6h PRN mild pain (x0 doses), Lopressor 52m PIV q6h PRN HTN (x0 doses)  Problem List: . Respiratory distress . Essential HTN . Mixed hyperlipidemia . Hyponatremia . Small cell lung cancer . Pleural effusion  Discharge Planning: Discharge tomorrow to home Family Contact: Met with son BKeenan Bachelorat bedside IDT: Updated Goals of Care: Pt wished to treat the treatable and to return home with hospice  support  Should patient need ambulance transport at discharge [facility]    Thank you for the opportunity to participate in this patient's care.  CDomenic Moras BSN, RN ABerstein Hilliker Hartzell Eye [facility] LLP D[facility] PaLiaison (listed on [facility])    [phone](24h on call)

## 2020-09-30 NOTE — ED Provider Notes (Signed)
[facility] DEPT Provider Note  CSN: [id number] Arrival date & time: 09/30/20 0114  Chief Complaint(s) Shortness of Breath  HPI Mary Merritt is a 87 y.o. female with a past medical history listed below including lung cancer and recurrent right pleural effusion who presents to the emergency department with 2 days of gradually worsening shortness of breath.  Mary Merritt is endorsing cough is nonproductive.  Shortness of breath worse with exertion.  Reports that Mary Merritt is scheduled for thoracentesis on Monday.  Denies any chest pain.  No nausea or vomiting.  No fevers or chills.  No abdominal pain.  Denies any other physical complaints.  Brought in by EMS and placed on nonrebreather due to increased work of breathing.  Normally uses 2 to 4 L nasal cannula at home at baseline.  HPI  Past Medical History Past Medical History:  Diagnosis Date  . AAA (abdominal aortic aneurysm) ([place])   . Anxiety   . Arthritis   . Dyspnea   . Dysrhythmia    bradycardia  . History of bronchitis   . Hyperlipidemia   . Hypertension   . Seasonal allergies    Patient Active Problem List   Diagnosis Date Noted  . Acute on chronic respiratory failure with hypoxia ([place]) 09/30/2020  . Pleural effusion 07/07/2020  . Small cell lung cancer ([place]) 06/05/2020  . Goals of care, counseling/discussion 06/05/2020  . Abnormal chest x-ray   . Pneumonia 05/14/2020  . Pain in right foot 11/17/2019  . Hyponatremia 11/22/2017  . Nondisplaced fracture of left tibial spine, subsequent encounter for closed fracture with routine healing 08/05/2017  . Chronic pain of left knee 07/21/2017  . Presence of left artificial hip joint 05/30/2016  . Hip joint replacement status 03/27/2016  . S/P hip replacement 07/27/2015  . Osteoarthritis of right hip 08/01/2014  . Essential hypertension 05/31/2014  . Mixed hyperlipidemia 05/31/2014  . S/P AAA repair 05/31/2014  . Bradycardia 05/31/2014  . Murmur 05/31/2014    Home Medication(s) Prior to Admission medications   Medication Sig Start Date End Date Taking? Authorizing Provider  aspirin EC 81 MG tablet Take 81 mg by mouth daily.   Yes [provider]  diphenhydrAMINE (BENADRYL) 25 mg capsule Take 25 mg by mouth at bedtime as needed for sleep.   Yes [provider]  escitalopram (LEXAPRO) 10 MG tablet Take 10 mg by mouth daily.   Yes [provider]  feeding supplement (ENSURE ENLIVE / ENSURE PLUS) LIQD Take 237 mLs by mouth 3 (three) times daily between meals. 07/14/20  Yes Mercy Riding, MD  HYDROcodone-acetaminophen (NORCO) 5-325 MG tablet Take 1 tablet by mouth every 6 (six) hours as needed for moderate pain. 09/27/20  Yes Owens Shark, NP  ibuprofen (ADVIL) 200 MG tablet Take 400 mg by mouth every 4 (four) hours as needed for mild pain.   Yes [provider]  metoprolol tartrate (LOPRESSOR) 25 MG tablet Take 0.5 tablets (12.5 mg total) by mouth 2 (two) times daily. 12/27/19  Yes Hilty, Nadean Corwin, MD  polyethylene glycol (MIRALAX / GLYCOLAX) 17 g packet Take 17 g by mouth 2 (two) times daily as needed. Patient taking differently: Take 17 g by mouth 2 (two) times daily as needed for mild constipation. 07/14/20  Yes Mercy Riding, MD  chlorpheniramine-HYDROcodone (TUSSIONEX) 10-8 MG/5ML SUER Take 5 mLs by mouth every 12 (twelve) hours as needed for cough. 09/28/20   Owens Shark, NP  Past Surgical History Past Surgical History:  Procedure Laterality Date  . ABDOMINAL AORTIC ANEURYSM REPAIR     2009, Dr. Scot Dock  . ABDOMINAL AORTOGRAM W/LOWER EXTREMITY Right 04/07/2020   Procedure: ABDOMINAL AORTOGRAM W/LOWER EXTREMITY;  Surgeon: Angelia Mould, MD;  Location: [facility];  Service: Cardiovascular;  Laterality: Right;  . CARDIAC CATHETERIZATION     patient thinks it was in 09  with dr Scot Dock  . cataracts removed    . INCONTINENCE SURGERY    . IR THORACENTESIS ASP PLEURAL SPACE W/IMG GUIDE  06/07/2020  . IR THORACENTESIS ASP PLEURAL SPACE W/IMG GUIDE  06/14/2020  . IR THORACENTESIS ASP PLEURAL SPACE W/IMG GUIDE  07/24/2020  . IR THORACENTESIS ASP PLEURAL SPACE W/IMG GUIDE  08/14/2020  . THORACENTESIS N/A 05/16/2020   Procedure: Mathews Robinsons;  Surgeon: Lanier Clam, MD;  Location: [facility];  Service: Pulmonary;  Laterality: N/A;  . TOTAL HIP ARTHROPLASTY Right 08/01/2014   Procedure: RIGHT TOTAL HIP ARTHROPLASTY ANTERIOR APPROACH;  Surgeon: Marianna Payment, MD;  Location: [facility];  Service: Orthopedics;  Laterality: Right;  . TOTAL HIP ARTHROPLASTY Left 03/27/2016   Procedure: LEFT TOTAL HIP ARTHROPLASTY ANTERIOR APPROACH;  Surgeon: Leandrew Koyanagi, MD;  Location: [facility];  Service: Orthopedics;  Laterality: Left;   Family History Family History  Problem Relation Age of Onset  . Heart failure Father   . Stroke Mother     Social History Social History   Tobacco Use  . Smoking status: Former Smoker    Packs/day: 1.00    Years: 30.00    Pack years: 30.00    Quit date: 05/31/1984    Years since quitting: 36.3  . Smokeless tobacco: Never Used  Vaping Use  . Vaping Use: Never used  Substance Use Topics  . Alcohol use: No    Alcohol/week: 0.0 standard drinks  . Drug use: No   Allergies Percocet [oxycodone-acetaminophen]  Review of Systems Review of Systems All other systems are reviewed and are negative for acute change except as noted in the HPI  Physical Exam Vital Signs  I have reviewed the triage vital signs BP (!) 170/117   Pulse (!) 111   Temp 97.6 F (36.4 C)   Resp (!) 26   SpO2 97%   Physical Exam Vitals reviewed.  Constitutional:      General: Mary Merritt is not in acute distress.    Appearance: Mary Merritt is well-developed. Mary Merritt is not diaphoretic.  HENT:     Head: Normocephalic and atraumatic.     Nose: Nose normal.  Eyes:      General: No scleral icterus.       Right eye: No discharge.        Left eye: No discharge.     Conjunctiva/sclera: Conjunctivae normal.     Pupils: Pupils are equal, round, and reactive to light.  Cardiovascular:     Rate and Rhythm: Normal rate and regular rhythm.     Heart sounds: No murmur heard. No friction rub. No gallop.   Pulmonary:     Effort: Tachypnea present.     Breath sounds: No stridor. Examination of the left-middle field reveals decreased breath sounds. Examination of the left-lower field reveals decreased breath sounds. Decreased breath sounds present. No rales.  Abdominal:     General: There is no distension.     Palpations: Abdomen is soft.     Tenderness: There is no abdominal tenderness.  Musculoskeletal:        General: No  tenderness.     Cervical back: Normal range of motion and neck supple.     Right lower leg: 1+ Pitting Edema present.     Left lower leg: 1+ Pitting Edema present.  Skin:    General: Skin is warm and dry.     Findings: No erythema or rash.  Neurological:     Mental Status: Mary Merritt is alert and oriented to person, place, and time.     ED Results and Treatments Labs (all labs ordered are listed, but only abnormal results are displayed) Labs Reviewed  COMPREHENSIVE METABOLIC PANEL - Abnormal; Notable for the following components:      Result Value   Sodium 125 (*)    Chloride 80 (*)    CO2 33 (*)    Glucose, Bld 114 (*)    Calcium 8.6 (*)    Total Protein 6.2 (*)    Albumin 2.9 (*)    AST 58 (*)    Total Bilirubin 2.1 (*)    All other components within normal limits  CBC WITH DIFFERENTIAL/PLATELET - Abnormal; Notable for the following components:   RBC 3.50 (*)    Hemoglobin 10.0 (*)    HCT 31.4 (*)    RDW 17.9 (*)    All other components within normal limits  RESP PANEL BY RT-PCR (FLU A&B, COVID) ARPGX2  LACTIC ACID, PLASMA  PROTIME-INR  BRAIN NATRIURETIC PEPTIDE                                                                                                                          EKG  EKG Interpretation  Date/Time:  Saturday September 30 2020 02:20:25 EDT Ventricular Rate:  94 PR Interval:  226 QRS Duration: 94 QT Interval:  391 QTC Calculation: 489 R Axis:   -21 Text Interpretation: Sinus rhythm Atrial premature complexes Prolonged PR interval Borderline left axis deviation Anteroseptal infarct, age indeterminate Otherwise no significant change Confirmed by Addison Lank [phone]) on 09/30/2020 2:25:10 AM      Radiology DG Chest 2 View  Result Date: 09/30/2020 CLINICAL DATA:  Shortness of breath x2 days. EXAM: CHEST - 2 VIEW COMPARISON:  August 14, 2020 FINDINGS: There is marked severity opacification of the right hemithorax with a mild amount of aerated lung seen along the medial aspect of the mid and upper right lung. This is increased in severity when compared to the prior study. Moderate severity left basilar atelectasis and/or infiltrate is seen. There is a small left pleural effusion. No pneumothorax is identified. Mild to moderate severity enlargement of the cardiac silhouette is seen. An ill-defined lobulated opacity is seen along the superior mediastinum on the left. A chronic eighth right rib fracture is noted. IMPRESSION: 1. Large right pleural effusion with associated extensive right upper lobe, right middle lobe and right lower lobe atelectasis and/or infiltrate. 2. Moderate severity left basilar atelectasis and/or infiltrate with a small left pleural effusion. 3. Findings consistent with the patient's known prevascular/anterior mediastinal soft tissue  mass. Electronically Signed   By: Virgina Norfolk M.D.   On: 09/30/2020 02:38    Pertinent labs & imaging results that were available during my care of the patient were reviewed by me and considered in my medical decision making (see chart for details).  Medications Ordered in ED Medications  morphine 2 MG/ML injection 1 mg (has no administration in time  range)                                                                                                                                    Procedures Procedures  (including critical care time)  Medical Decision Making / ED Course I have reviewed the nursing notes for this encounter and the patient's prior records (if available in EHR or on provided paperwork).   AMBRIANA SELWAY was evaluated in Emergency Department on 09/30/2020 for the symptoms described in the history of present illness. Mary Merritt was evaluated in the context of the global COVID-19 pandemic, which necessitated consideration that the patient might be at risk for infection with the SARS-CoV-2 virus that causes COVID-19. Institutional protocols and algorithms that pertain to the evaluation of patients at risk for COVID-19 are in a state of rapid change based on information released by regulatory bodies including the CDC and federal and state organizations. These policies and algorithms were followed during the patient's care in the ED.  Patient presents with shortness of breath.  Chest x-ray notable for worsening right pleural effusion.  Patient is afebrile and with no leukocytosis on CBC.  Doubt pneumonia at this time.  Patient admitted to medicine for thoracentesis.      Final Clinical Impression(s) / ED Diagnoses Final diagnoses:  Shortness of breath  Recurrent right pleural effusion      This chart was dictated using voice recognition software.  Despite best efforts to proofread,  errors can occur which can change the documentation meaning.   Fatima Blank, MD 09/30/20 [phone]

## 2020-09-30 NOTE — ED Triage Notes (Signed)
Patient here from home reporting SOB that started 2 days ago. States that she was covid tested today. Normally wears 2-4L of O2. Hx of lung cancer. Currently 100% on NR.

## 2020-10-01 ENCOUNTER — Observation Stay (HOSPITAL_COMMUNITY)

## 2020-10-01 DIAGNOSIS — E782 Mixed hyperlipidemia: Secondary | ICD-10-CM | POA: Diagnosis present

## 2020-10-01 DIAGNOSIS — D630 Anemia in neoplastic disease: Secondary | ICD-10-CM | POA: Diagnosis present

## 2020-10-01 DIAGNOSIS — Z87891 Personal history of nicotine dependence: Secondary | ICD-10-CM | POA: Diagnosis not present

## 2020-10-01 DIAGNOSIS — L8990 Pressure ulcer of unspecified site, unspecified stage: Secondary | ICD-10-CM | POA: Insufficient documentation

## 2020-10-01 DIAGNOSIS — J939 Pneumothorax, unspecified: Secondary | ICD-10-CM | POA: Diagnosis present

## 2020-10-01 DIAGNOSIS — Z66 Do not resuscitate: Secondary | ICD-10-CM | POA: Diagnosis present

## 2020-10-01 DIAGNOSIS — Z515 Encounter for palliative care: Secondary | ICD-10-CM | POA: Diagnosis not present

## 2020-10-01 DIAGNOSIS — Z7189 Other specified counseling: Secondary | ICD-10-CM

## 2020-10-01 DIAGNOSIS — Z885 Allergy status to narcotic agent status: Secondary | ICD-10-CM | POA: Diagnosis not present

## 2020-10-01 DIAGNOSIS — E871 Hypo-osmolality and hyponatremia: Secondary | ICD-10-CM | POA: Diagnosis present

## 2020-10-01 DIAGNOSIS — J90 Pleural effusion, not elsewhere classified: Secondary | ICD-10-CM | POA: Diagnosis not present

## 2020-10-01 DIAGNOSIS — R0603 Acute respiratory distress: Secondary | ICD-10-CM | POA: Diagnosis present

## 2020-10-01 DIAGNOSIS — Z96643 Presence of artificial hip joint, bilateral: Secondary | ICD-10-CM | POA: Diagnosis present

## 2020-10-01 DIAGNOSIS — R0602 Shortness of breath: Secondary | ICD-10-CM | POA: Diagnosis present

## 2020-10-01 DIAGNOSIS — J948 Other specified pleural conditions: Secondary | ICD-10-CM | POA: Diagnosis present

## 2020-10-01 DIAGNOSIS — E876 Hypokalemia: Secondary | ICD-10-CM | POA: Diagnosis present

## 2020-10-01 DIAGNOSIS — C3490 Malignant neoplasm of unspecified part of unspecified bronchus or lung: Secondary | ICD-10-CM | POA: Diagnosis not present

## 2020-10-01 DIAGNOSIS — Z7982 Long term (current) use of aspirin: Secondary | ICD-10-CM | POA: Diagnosis not present

## 2020-10-01 DIAGNOSIS — F32A Depression, unspecified: Secondary | ICD-10-CM | POA: Diagnosis present

## 2020-10-01 DIAGNOSIS — I10 Essential (primary) hypertension: Secondary | ICD-10-CM | POA: Diagnosis present

## 2020-10-01 DIAGNOSIS — Z8249 Family history of ischemic heart disease and other diseases of the circulatory system: Secondary | ICD-10-CM | POA: Diagnosis not present

## 2020-10-01 DIAGNOSIS — Z9221 Personal history of antineoplastic chemotherapy: Secondary | ICD-10-CM | POA: Diagnosis not present

## 2020-10-01 DIAGNOSIS — J910 Malignant pleural effusion: Secondary | ICD-10-CM | POA: Diagnosis present

## 2020-10-01 DIAGNOSIS — J9611 Chronic respiratory failure with hypoxia: Secondary | ICD-10-CM | POA: Diagnosis present

## 2020-10-01 DIAGNOSIS — F419 Anxiety disorder, unspecified: Secondary | ICD-10-CM | POA: Diagnosis present

## 2020-10-01 DIAGNOSIS — R000 Tachycardia, unspecified: Secondary | ICD-10-CM | POA: Diagnosis present

## 2020-10-01 DIAGNOSIS — Z79899 Other long term (current) drug therapy: Secondary | ICD-10-CM | POA: Diagnosis not present

## 2020-10-01 DIAGNOSIS — C3491 Malignant neoplasm of unspecified part of right bronchus or lung: Secondary | ICD-10-CM | POA: Diagnosis present

## 2020-10-01 DIAGNOSIS — Z20822 Contact with and (suspected) exposure to covid-19: Secondary | ICD-10-CM | POA: Diagnosis present

## 2020-10-01 LAB — BASIC METABOLIC PANEL
Anion gap: 14 (ref 5–15)
Anion gap: 11 (ref 5–15)
BUN: 7 mg/dL — ABNORMAL LOW (ref 8–23)
BUN: 8 mg/dL (ref 8–23)
CO2: 31 mmol/L (ref 22–32)
CO2: 36 mmol/L — ABNORMAL HIGH (ref 22–32)
Calcium: 8.4 mg/dL — ABNORMAL LOW (ref 8.9–10.3)
Calcium: 8.6 mg/dL — ABNORMAL LOW (ref 8.9–10.3)
Chloride: 80 mmol/L — ABNORMAL LOW (ref 98–111)
Chloride: 82 mmol/L — ABNORMAL LOW (ref 98–111)
Creatinine, Ser: 0.37 mg/dL — ABNORMAL LOW (ref 0.44–1.00)
Creatinine, Ser: 0.65 mg/dL (ref 0.44–1.00)
GFR, Estimated: >60 mL/min (ref >60)
GFR, Estimated: >60 mL/min (ref >60)
Glucose, Bld: 93 mg/dL (ref 70–99)
Glucose, Bld: 140 mg/dL — ABNORMAL HIGH (ref 70–99)
Potassium: 2.9 mmol/L — ABNORMAL LOW (ref 3.5–5.1)
Potassium: 3.2 mmol/L — ABNORMAL LOW (ref 3.5–5.1)
Sodium: 125 mmol/L — ABNORMAL LOW (ref 135–145)
Sodium: 129 mmol/L — ABNORMAL LOW (ref 135–145)

## 2020-10-01 LAB — CBC
HCT: 31.1 % — ABNORMAL LOW (ref 36.0–46.0)
Hemoglobin: 9.6 g/dL — ABNORMAL LOW (ref 12.0–15.0)
MCH: 28.2 pg (ref 26.0–34.0)
MCHC: 30.9 g/dL (ref 30.0–36.0)
MCV: 91.5 fL (ref 80.0–100.0)
Platelets: 301 10*3/uL (ref 150–400)
RBC: 3.4 MIL/uL — ABNORMAL LOW (ref 3.87–5.11)
RDW: 17.8 % — ABNORMAL HIGH (ref 11.5–15.5)
WBC: 9.3 10*3/uL (ref 4.0–10.5)
nRBC: 0 % (ref 0.0–0.2)

## 2020-10-01 MED ORDER — MORPHINE SULFATE 10 MG/5ML PO SOLN
5.0000 mg | ORAL | Status: DC | PRN
  Administered 2020-10-01 (×3): 5 mg via ORAL
  Filled 2020-10-01 (×3): qty 5

## 2020-10-01 MED ORDER — MORPHINE SULFATE (PF) 2 MG/ML IV SOLN
2.0000 mg | INTRAVENOUS | Status: DC | PRN
Start: 2020-10-01 — End: 2020-10-04
  Administered 2020-10-01 – 2020-10-03 (×8): 2 mg via INTRAVENOUS
  Filled 2020-10-01 (×9): qty 1

## 2020-10-01 MED ORDER — POTASSIUM CHLORIDE CRYS ER 20 MEQ PO TBCR
40.0000 meq | EXTENDED_RELEASE_TABLET | Freq: Two times a day (BID) | ORAL | Status: DC
  Administered 2020-10-01: 40 meq via ORAL
  Filled 2020-10-01: qty 2

## 2020-10-01 MED ORDER — POTASSIUM CHLORIDE 20 MEQ PO PACK
40.0000 meq | PACK | Freq: Two times a day (BID) | ORAL | Status: AC
  Administered 2020-10-01 – 2020-10-02 (×2): 40 meq via ORAL
  Filled 2020-10-01 (×2): qty 2

## 2020-10-01 MED ORDER — LORAZEPAM 2 MG/ML IJ SOLN
0.5000 mg | Freq: Once | INTRAMUSCULAR | Status: AC
  Administered 2020-10-01: 0.5 mg via INTRAVENOUS
  Filled 2020-10-01: qty 1

## 2020-10-01 NOTE — Progress Notes (Signed)
Mary Merritt [address] [facility]) hospitalized hospice patient visit  Mary Merritt is a current hospice patient, admitted 09/29/20, with a terminal diagnosis of small cell lung cancer.  Family activated EMS after pt began to complain of severe shortness of breath with severe back pain.  Family contacted hospice before activating 911 but declined on call nurse visit in favor of transporting to emergency department.  The patient was admitted to [facility] on 09/30/20 with a diagnosis of respiratory distress. Per Dr. Konrad Dolores with [facility] this is a related hospital admission.  Report exchanged. Visited patient at the bedside, daughter Mary Merritt present. Patient had received morphine around 1 pm which had alleviated her dyspnea and pain. Upon arrival @ 230, patient stated pain had returned and she was notable tachypneic. Bedside RN notified and provided additional morphine. Patient resting comfortably at conclusion of visit. Patient will go to IR on 4/11 for a pleurex drain to be placed.   V/S: 98 oral, 124/62, HR 108, RR 18, SPO2 97% on [place] @ 4 lpm I&O: 120/0 Labs: Na 129, K 3.2, chloride 82 Diagnostics: portable CXR showed slight increase in hydropneumothorax IVs/PRNs: morphine 5 mg suspension PO x 1, morphine 1 mg IV x 1, morphine 2 mg IV x 1  Problem List: - respiratory distress secondary to pleural effusion - thoracentesis on Saturday, O2 increased for comfort, morphine for dysnpea and pain - small cell lung cancer - was admitted onto hospice services in the home on 4/8 for this, patient then transported later that evening to the ED for dyspnea and severe pain - hyponatremia - today 129, no IVFs, may be chronic in nature  D/C planning: Ongoing. Pt lives alone, has a caregiver most hours of the day and her daughter Mary Merritt stays each night. Mary Merritt is concerned about her current mobility as she seems to have declined significantly. May need PT/OT consult after pleurex drain placed to see if she can  safely return back home. Family also requests hospital bed, [facility] will order. GOC: clear, return home with hospice support Family: updated at the bedside IDT: updated  Venia Carbon RN, BSN, [facility] Liaison

## 2020-10-01 NOTE — Progress Notes (Signed)
PROGRESS NOTE    Mary Merritt  SMO:[id number] DOB: Feb 03, 1928 DOA: 09/30/2020 PCP: Katherina Mires, MD   Brief Narrative:  The patient is a 87 year old elderly chronically ill-appearing Caucasian female with a past medical history significant for but not limited to AAA, bradycardia, hypertension, hyperlipidemia, chronic hypoxic respiratory failure on 2 to 4 L of supplemental oxygen at home, history of small cell cancer status post chemotherapy and recurrent malignant pleural effusions requiring multiple thoracentesis who presented to the ED with shortness breath for 2 days and had to be placed on nonrebreather.  She was recently seen by her oncologist on 09/28/2020 for review of her CT scans which unfortunately showed marked disease progression.  She is at that time referred for hospice care and made DNR at that time.  She is also referred for palliative thoracentesis to be done on Monday however symptoms became so severe that she presented to ED having this done.  She also admitted to epigastric pain and mildly productive cough without hemoptysis.  In the ED she was placed on nonrebreather and labs were reviewed and she had a sodium 125, potassium 4.7.  She was afebrile and had no leukocytosis.  Her chest x-ray did show very large right sided pleural effusion with extensive right-sided atelectasis and moderate left basilar atelectasis and/or infiltrate with small pleural effusions and findings consistent with known mediastinal soft tissue mass.  TRH was asked to admit this patient for her respiratory distress in the setting of chronic hypoxic respiratory failure due to recurrent malignant pleural effusions.  Interventional radiology was consulted for a thoracentesis and she underwent thoracentesis however subsequently after thoracentesis repeat chest x-ray showed pneumothorax likely ex vacuo.  She is tolerating her home supplemental oxygen but was observed overnight and today she is more tachypneic and repeat  chest x-ray shows worsening pleural effusion.  Interventional radiology was reconsulted for evaluation of thorax catheter and palliative care evaluated as well.  Assessment & Plan:   Principal Problem:   Respiratory distress Active Problems:   Essential hypertension   Mixed hyperlipidemia   Hyponatremia   Small cell lung cancer (HCC)   Pleural effusion   Pressure injury of skin  Acute Respiratory distress in the setting of chronic hypoxic respiratory failure secondary to recurrent malignant right pleural effusion and extensive right-sided and moderate left atelectasis  Small Cell Lung Cancer -IR thoracentesis yielded 900 cc but repeat CXR after showed "Right-sided ex vacuo hydropneumothorax is favored after thoracentesis performed.  Consolidative changes of the right lung in this patient with known metastatic small cell carcinoma. Left apical mass with nodular changes, compatible with known malignancy." -SpO2: 97 % and wearing 4 Liters  -Repeat CXR this AM "Slight interval increase in the moderate right hydropneumothorax. Persistent retrocardiac left base collapse/consolidation with probable small left pleural effusion. Left paramediastinal opacity in the upper left chest is compatible with the patient's known mass lesion. Bones are diffusely demineralized." -Will ask IR to see if the patient for Pleur-X Catheter drainage  -Pain management as needed and Palliative Consulted and she has Liquid Morphine and IV Morphine 2 mg IV q3hprn  Pain and SOB -Recently referred for hospice however has not had this set up just yet.  Palliative care consulted while inpatient to potentially help the process along  Sinus Tachycardia  Hypertension -See Below  Hypokalemia -Patient's K+ went from 4.7 -> 2.9 -> 3.2 -Replete with po KCl 40 mEQ BID -Check Mag Level in the AM  -Continue to Monitor and Replete as  Necessary  -Repeat CMP in the AM   Hyponatremia -Sodium again 125 this AM, below baseline  and repeat this AM was 129 -Likely from volume overload and possibly from poor p.o. intake -Continue Hold off on fluids for now to prevent further volume overload and follow-up after thoracentesis -Continue to Monitor and Trend  Normocytic Anemia -Patient's Hgb/Hct went from 10.0/31.4 -> 9.6/31.1 -In the setting of Malignancy -Continue to Monitor for S/Sx of Bleeding; No overt bleeding -Repeat CBC in the AM  Hypertension -C/w As needed IV Metoprolol Tartrate 5 mg IV q6hprn HBP -C/w Metoprolol 12.5 mg po BID  Depression and Anxiety -Continue home Escitalopram 10 mg po Daily  Hyperlipidemia -Not on home meds  GOC: DNR, poA -Palliative Care consulted and [place] Discussion happening -She will require a Pleur-X Catheter and Palliative Care has added Morphine Solution 5 mg po q3hprn Pain and SOB  DVT prophylaxis: Enoxaparin 40 mg sq q24h Code Status: DO NOT RESUSCITATE  Family Communication: Discussed with Daughter at bedside  Disposition Plan: Pending further clinical improvement and placement of Pleur-X  Status is: Observation  The patient will require care spanning > 2 midnights and should be moved to inpatient because: Unsafe d/c plan, IV treatments appropriate due to intensity of illness or inability to take PO and Inpatient level of care appropriate due to severity of illness  Dispo: The patient is from: Home              Anticipated d/c is to: Home with Hospice              Patient currently is not medically stable to d/c.   Difficult to place patient No  Consultants:   Palliative Care Medicine  Interventional Radiology   Procedures:  Sound guided right sided thoracentesis that removed 900 cc but due to patient discomfort she asked the physician to stop aspiration  Antimicrobials:  Anti-infectives (From admission, onward)   None        Subjective: Seen and examined at bedside and she was tachypneic and did not feel as well today.  Had a thoracentesis yesterday  but fluid has reaccumulated.  Denies any nausea or vomiting.  Had some chest discomfort.  No other concerns or complaints at this time.  Objective: Vitals:   09/30/20 1738 09/30/20 2020 10/01/20 0535 10/01/20 1351  BP: (!) 159/82 (!) 150/67 (!) 170/93 124/62  Pulse: 97 100 (!) 109 (!) 108  Resp: (!) 22 20 20 18   Temp: 98.2 F (36.8 C) 97.7 F (36.5 C) 98.6 F (37 C) 98 F (36.7 C)  TempSrc: Oral Oral Oral Oral  SpO2: 97% 98% 98% 97%    Intake/Output Summary (Last 24 hours) at 10/01/2020 1556 Last data filed at 09/30/2020 1900 Gross per 24 hour  Intake 120 ml  Output --  Net 120 ml   There were no vitals filed for this visit.  Examination: Physical Exam:  Constitutional: WN/WD chronically ill-appearing Caucasian elderly female currently in slight respiratory distress Eyes: Lids and conjunctivae normal, sclerae anicteric  ENMT: External Ears, Nose appear normal. Grossly normal hearing.  Neck: Appears normal, supple, no cervical masses, normal ROM, no appreciable thyromegaly; no JVD Respiratory: Diminished to auscultation bilaterally with coarse breath sounds at the bases worse on the right compared to left, no wheezing, rales, rhonchi or crackles.  Has a slightly increased respiratory effort and is tachypneic wearing supplemental oxygen via nasal cannula Cardiovascular: Tachycardic rate but regular rhythm, no murmurs / rubs / gallops. S1 and S2  auscultated.  Mild extremity edema Abdomen: Soft, non-tender, non-distended. Bowel sounds positive.  GU: Deferred. Musculoskeletal: No clubbing / cyanosis of digits/nails. No joint deformity upper and lower extremities.  Skin: No rashes, lesions, ulcers on limited skin evaluation. No induration; Warm and dry.  Neurologic: CN 2-12 grossly intact with no focal deficits. Romberg sign and cerebellar reflexes not assessed.  Psychiatric: Normal judgment and insight. Alert and oriented x 3. Anxious mood and appropriate affect.   Data Reviewed: I  have personally reviewed following labs and imaging studies  CBC: Recent Labs  Lab 09/26/20 1417 09/30/20 0145 10/01/20 0327  WBC 9.3 8.8 9.3  NEUTROABS 7.5 7.1  --   HGB 9.9* 10.0* 9.6*  HCT 33.0* 31.4* 31.1*  MCV 90.9 89.7 91.5  PLT 382 376 998   Basic Metabolic Panel: Recent Labs  Lab 09/26/20 1417 09/30/20 0145 10/01/20 0327 10/01/20 1431  NA 133* 125* 125* 129*  K 3.6 4.7 2.9* 3.2*  CL 87* 80* 80* 82*  CO2 37* 33* 31 36*  GLUCOSE 107* 114* 93 140*  BUN 11 9 7* 8  CREATININE 0.56 0.56 0.37* 0.65  CALCIUM 9.3 8.6* 8.4* 8.6*   GFR: Estimated Creatinine Clearance: 42.1 mL/min (by C-G formula based on SCr of 0.65 mg/dL). Liver Function Tests: Recent Labs  Lab 09/26/20 1417 09/30/20 0145  AST 18 58*  ALT 8 12  ALKPHOS 67 72  BILITOT 0.3 2.1*  PROT 6.4* 6.2*  ALBUMIN 3.4* 2.9*   No results for input(s): LIPASE, AMYLASE in the last 168 hours. No results for input(s): AMMONIA in the last 168 hours. Coagulation Profile: Recent Labs  Lab 09/30/20 0145  INR 1.0   Cardiac Enzymes: No results for input(s): CKTOTAL, CKMB, CKMBINDEX, TROPONINI in the last 168 hours. BNP (last 3 results) No results for input(s): PROBNP in the last 8760 hours. HbA1C: No results for input(s): HGBA1C in the last 72 hours. CBG: No results for input(s): GLUCAP in the last 168 hours. Lipid Profile: No results for input(s): CHOL, HDL, LDLCALC, TRIG, CHOLHDL, LDLDIRECT in the last 72 hours. Thyroid Function Tests: No results for input(s): TSH, T4TOTAL, FREET4, T3FREE, THYROIDAB in the last 72 hours. Anemia Panel: No results for input(s): VITAMINB12, FOLATE, FERRITIN, TIBC, IRON, RETICCTPCT in the last 72 hours. Sepsis Labs: Recent Labs  Lab 09/30/20 0142  LATICACIDVEN 1.6    Recent Results (from the past 240 hour(s))  SARS CORONAVIRUS 2 (TAT 6-24 HRS) Nasopharyngeal Nasopharyngeal Swab     Status: None   Collection Time: 09/29/20  1:31 PM   Specimen: Nasopharyngeal Swab   Result Value Ref Range Status   SARS Coronavirus 2 NEGATIVE NEGATIVE Final    Comment: (NOTE) SARS-CoV-2 target nucleic acids are NOT DETECTED.  The SARS-CoV-2 RNA is generally detectable in upper and lower respiratory specimens during the acute phase of infection. Negative results do not preclude SARS-CoV-2 infection, do not rule out co-infections with other pathogens, and should not be used as the sole basis for treatment or other patient management decisions. Negative results must be combined with clinical observations, patient history, and epidemiological information. The expected result is Negative.  Fact Sheet for Patients: SugarRoll.be  Fact Sheet for Healthcare Providers: https://www.woods-mathews.com/  This test is not yet approved or cleared by the [place] FDA and  has been authorized for detection and/or diagnosis of SARS-CoV-2 by FDA under an Emergency Use Authorization (EUA). This EUA will remain  in effect (meaning this test can be used) for the duration of the COVID-19  declaration under Se ction 564(b)(1) of the Act, 21 U.S.C. section 360bbb-3(b)(1), unless the authorization is terminated or revoked sooner.  Performed at [facility], [place] [address], [place], [place] [address]   Resp Panel by RT-PCR (Flu A&B, Covid) Nasopharyngeal Swab     Status: None   Collection Time: 09/30/20  2:26 AM   Specimen: Nasopharyngeal Swab; Nasopharyngeal(NP) swabs in vial transport medium  Result Value Ref Range Status   SARS Coronavirus 2 by RT PCR NEGATIVE NEGATIVE Final    Comment: (NOTE) SARS-CoV-2 target nucleic acids are NOT DETECTED.  The SARS-CoV-2 RNA is generally detectable in upper respiratory specimens during the acute phase of infection. The lowest concentration of SARS-CoV-2 viral copies this assay can detect is 138 copies/mL. A negative result does not preclude SARS-Cov-2 infection and should not be used as  the sole basis for treatment or other patient management decisions. A negative result may occur with  improper specimen collection/handling, submission of specimen other than nasopharyngeal swab, presence of viral mutation(s) within the areas targeted by this assay, and inadequate number of viral copies(<138 copies/mL). A negative result must be combined with clinical observations, patient history, and epidemiological information. The expected result is Negative.  Fact Sheet for Patients:  EntrepreneurPulse.com.au  Fact Sheet for Healthcare Providers:  IncredibleEmployment.be  This test is no t yet approved or cleared by the [place] FDA and  has been authorized for detection and/or diagnosis of SARS-CoV-2 by FDA under an Emergency Use Authorization (EUA). This EUA will remain  in effect (meaning this test can be used) for the duration of the COVID-19 declaration under Section 564(b)(1) of the Act, 21 U.S.C.section 360bbb-3(b)(1), unless the authorization is terminated  or revoked sooner.       Influenza A by PCR NEGATIVE NEGATIVE Final   Influenza B by PCR NEGATIVE NEGATIVE Final    Comment: (NOTE) The Xpert Xpress SARS-CoV-2/FLU/RSV plus assay is intended as an aid in the diagnosis of influenza from Nasopharyngeal swab specimens and should not be used as a sole basis for treatment. Nasal washings and aspirates are unacceptable for Xpert Xpress SARS-CoV-2/FLU/RSV testing.  Fact Sheet for Patients: EntrepreneurPulse.com.au  Fact Sheet for Healthcare Providers: IncredibleEmployment.be  This test is not yet approved or cleared by the [place] FDA and has been authorized for detection and/or diagnosis of SARS-CoV-2 by FDA under an Emergency Use Authorization (EUA). This EUA will remain in effect (meaning this test can be used) for the duration of the COVID-19 declaration under Section 564(b)(1) of  the Act, 21 U.S.C. section 360bbb-3(b)(1), unless the authorization is terminated or revoked.  Performed at [facility], [place] [address] [place], [place] [address]      RN Pressure Injury Documentation: Pressure Injury 09/30/20 Sacrum Mid Stage 2 -  Partial thickness loss of dermis presenting as a shallow open injury with a red, pink wound bed without slough. 1cmx1cmx0cm pink (Active)  09/30/20 1600  Location: Sacrum  Location Orientation: Mid  Staging: Stage 2 -  Partial thickness loss of dermis presenting as a shallow open injury with a red, pink wound bed without slough.  Wound Description (Comments): 1cmx1cmx0cm pink  Present on Admission: Yes     Estimated body mass index is 23.63 kg/m as calculated from the following:   Height as of 09/28/20: 5\' 5"  (1.651 m).   Weight as of 09/28/20: 64.4 kg.  Malnutrition Type:   Malnutrition Characteristics:   Nutrition Interventions:    Radiology Studies: DG Chest 2  View  Result Date: 09/30/2020 CLINICAL DATA:  Shortness of breath x2 days. EXAM: CHEST - 2 VIEW COMPARISON:  August 14, 2020 FINDINGS: There is marked severity opacification of the right hemithorax with a mild amount of aerated lung seen along the medial aspect of the mid and upper right lung. This is increased in severity when compared to the prior study. Moderate severity left basilar atelectasis and/or infiltrate is seen. There is a small left pleural effusion. No pneumothorax is identified. Mild to moderate severity enlargement of the cardiac silhouette is seen. An ill-defined lobulated opacity is seen along the superior mediastinum on the left. A chronic eighth right rib fracture is noted. IMPRESSION: 1. Large right pleural effusion with associated extensive right upper lobe, right middle lobe and right lower lobe atelectasis and/or infiltrate. 2. Moderate severity left basilar atelectasis and/or infiltrate with a small left pleural effusion. 3. Findings  consistent with the patient's known prevascular/anterior mediastinal soft tissue mass. Electronically Signed   By: Virgina Norfolk M.D.   On: 09/30/2020 02:38   DG CHEST PORT 1 VIEW  Result Date: 10/01/2020 CLINICAL DATA:  Pneumothorax.  Shortness of breath. EXAM: PORTABLE CHEST 1 VIEW COMPARISON:  09/30/2020 FINDINGS: Slight interval increase in the moderate right hydropneumothorax. Persistent retrocardiac left base collapse/consolidation with probable small left pleural effusion. Left paramediastinal opacity in the upper left chest is compatible with the patient's known mass lesion. Bones are diffusely demineralized. IMPRESSION: 1. Slight interval increase in the moderate right hydropneumothorax. 2. Otherwise stable exam. Electronically Signed   By: Misty Stanley M.D.   On: 10/01/2020 11:00   DG Chest Port 1 View  Result Date: 09/30/2020 CLINICAL DATA:  87 year old female with recurrent right-sided pleural effusion secondary to known advanced small cell carcinoma, referred for thoracentesis EXAM: PORTABLE CHEST 1 VIEW COMPARISON:  CT 09/27/2020, chest x-ray 08/14/2020, chest x-ray 09/30/2020 FINDINGS: Cardiomediastinal silhouette likely unchanged, borderline enlarged heart border. Right-sided hydropneumothorax status post right-sided thoracentesis with significant reduction in the volume right-sided pleural fluid. Consolidative changes in the right lung, compatible with known pleural/parenchymal metastatic disease. Opacity at the left lung apex, suprahilar region, compatible with known mediastinal/pleural involvement on prior CT. Nodular changes of the left lung compatible with known malignancy and better seen on prior CT. IMPRESSION: Right-sided ex vacuo hydropneumothorax is favored after thoracentesis performed. Consolidative changes of the right lung in this patient with known metastatic small cell carcinoma. Left apical mass with nodular changes, compatible with known malignancy. Electronically Signed    By: Corrie Mckusick D.O.   On: 09/30/2020 12:01   US THORACENTESIS ASP PLEURAL SPACE W/IMG GUIDE  Result Date: 09/30/2020 INDICATION: 87 year old female with a history of small cell carcinoma and recurrent right-sided pleural effusion EXAM: ULTRASOUND GUIDED RIGHT THORACENTESIS MEDICATIONS: None. COMPLICATIONS: None PROCEDURE: An ultrasound guided thoracentesis was thoroughly discussed with the patient and questions answered. The benefits, risks, alternatives and complications were also discussed. The patient understands and wishes to proceed with the procedure. Written consent was obtained. Ultrasound was performed to localize and mark an adequate pocket of fluid in the right chest. The area was then prepped and draped in the normal sterile fashion. 1% Lidocaine was used for local anesthesia. Under ultrasound guidance a 8 Fr Safe-T-Centesis catheter was introduced. Thoracentesis was performed. The catheter was removed when the patient complained of some chest discomfort and a dressing applied. FINDINGS: A total of approximately 900 cc of thin amber fluid was removed. IMPRESSION: Status post ultrasound-guided right-sided thoracentesis. Signed, Dulcy Fanny. Earleen Newport, DO, RPVI  [facility] Radiology Electronically Signed   By: Corrie Mckusick D.O.   On: 09/30/2020 13:11   Scheduled Meds: . aspirin EC  81 mg Oral Daily  . enoxaparin (LOVENOX) injection  40 mg Subcutaneous Q24H  . escitalopram  10 mg Oral Daily  . metoprolol tartrate  12.5 mg Oral BID  . potassium chloride  40 mEq Oral BID  . sodium chloride flush  3 mL Intravenous Q12H   Continuous Infusions:   LOS: 0 days   Kerney Elbe, DO [facility] PAGER is on AMION  If 7PM-7AM, please contact night-coverage [email or web address]

## 2020-10-01 NOTE — Consult Note (Signed)
Palliative care consult note  Reason for consult: Shortness of breath with recurrent pleural effusion in light of small cell lung cancer  Palliative care consult received.  Chart reviewed yesterday including personal review of pertinent labs and imaging.  Reviewed chart for of overnight events.  I received a call from Dr. Alfredia Ferguson that Mary Merritt became dyspneic again this morning and concern for recurrent pleural effusion.  CXR reveals fluid beginning to return.  I saw and examined Mary Merritt today.  Her daughter was at the bedside as well.  She was sitting in bed in moderate distress from shortness of breath and was tachypnic.  She reports feeling short of breath.  States that she feels how she dis prior to thoracentesis.  We discussed recurrent effusions and potential options including consideration for pleurex catheter.  Also discussed option of forgoing pleurex or further thoracentesis and increasing opioids understanding that he would quickly worsen.  - DNR/DNI - She would like to pursue pleurex placement if it it possible.  Notified Dr. Alfredia Ferguson and discussed with liaison from [facility]. - Plan for trial of oral morphine to see if it will improve her dyspnea.  I also left backup dose of IV medication to be used of oral medication is ineffective to relieve symptoms.  Start time: 1215 End time: 1300 Total time: 45 minutes  Greater than 50%  of this time was spent counseling and coordinating care related to the above assessment and plan.  [name], MD [facility] Team [phone]

## 2020-10-01 NOTE — Progress Notes (Signed)
Palliative Care Consult received  Chart reviewed.  Briefly, Mary Merritt is a 87 year old female who is currently admitted to hospice with terminal diagnosis of small cell lung cancer.  She was scheduled to receive thoracentesis early next week, however, she developed worsening shortness of breath and EMS was activated.  In the ED she was found to have fluid accumulation and had thoracentesis with 900 cc of fluid removed before her asking to abort procedure due to discomfort.  She is reported to be feeling much better after procedure and indicated desire to discharge home.  Admitting provider recommended observation overnight due to acuity of her condition at presentation and having thoracentesis performed.  Patient and son noted to have verbalized understanding and agreement with plan for likely discharge home tomorrow.  -DNR/DNI -Mary Merritt is a hospice patient with [facility].  Plan is for her to return home with hospice services tomorrow. -No specific palliative recommendations at this point.  I discussed with hospice liaison who has seen Mary Merritt today.  We will hold on formal consult as symptoms appear controlled at this point and plan is clear for transition back home with hospice services when she is ready for discharge.  Will hold on formal consult at this time.  Please call if there are needs with which our team can be of assistance and we would be happy to engage further.  [name], MD [facility] Team [phone]  NO CHARGE NOTE

## 2020-10-01 NOTE — Consult Note (Signed)
WOC Nurse Consult Note: Reason for Consult:Stage 2 PI to sacrum \\Wound  type:Pressure Pressure Injury POA: Yes Measurement:1cm x 1cm x 0.1cm Wound KVQ:QVZD, moist Drainage (amount, consistency, odor) scant serous Periwound:intact Dressing procedure/placement/frequency: Turning is in place.  Silicone foam placed and is intact and this is appropriate. Patient to return home with hospice.   [facility] nursing team will not follow, but will remain available to this patient, the nursing and medical teams.  Please re-consult if needed. Thanks, Maudie Flakes, MSN, RN, [place], Arther Abbott  Pager# [phone]

## 2020-10-01 NOTE — TOC Initial Note (Addendum)
Transition of Care [facility]) - Initial/Assessment Note    Patient Details  Name: Mary Merritt MRN: [id number] Date of Birth: [birth year]  Transition of Care [facility]) CM/SW Contact:    [name] Phone Number:  [phone] 10/01/2020, 4:10 PM  Clinical Narrative:                  CSW spoke patient's daughter [name] [phone].  CSW explained role of TOC in patient care and went over the SNF placement and HH process and estimated timeline.  Ms, [name] stated the patientwent to SNF in January and was familiar with process. Ms. [name] stated she is concerned about the patient recovering at home and stated she is considering long-term, ALF options for the patient.  CSW and [name] spoke about Medicaid eligibility and applicaiton process. CSW gave [name] and [email or web address] information.     Patient Goals and CMS Choice        Expected Discharge Plan and Services                                                Prior Living Arrangements/Services                       Activities of Daily Living Home Assistive Devices/Equipment: Wheelchair,Hearing aid,Walker (specify type) ADL Screening (condition at time of admission) Patient's cognitive ability adequate to safely complete daily activities?: Yes Is the patient deaf or have difficulty hearing?: No Does the patient have difficulty seeing, even when wearing glasses/contacts?: No Does the patient have difficulty concentrating, remembering, or making decisions?: No Patient able to express need for assistance with ADLs?: Yes Does the patient have difficulty dressing or bathing?: Yes Independently performs ADLs?: No Communication: Independent Dressing (OT): Needs assistance Is this a change from baseline?: Pre-admission baseline Grooming: Independent Feeding: Independent Bathing: Needs assistance Is this a change from baseline?: Pre-admission baseline Toileting: Needs assistance Is this  a change from baseline?: Pre-admission baseline In/Out Bed: Needs assistance Is this a change from baseline?: Pre-admission baseline Walks in Home: Independent with device (comment) (walker per pt) Does the patient have difficulty walking or climbing stairs?: Yes Weakness of Legs: Both Weakness of Arms/Hands: None  Permission Sought/Granted                  Emotional Assessment              Admission diagnosis:  Shortness of breath [R06.02] Pleural effusion [J90] Pneumothorax on right [J93.9] Recurrent right pleural effusion [J90] Acute on chronic respiratory failure with hypoxia ([place]) [J96.21] Patient Active Problem List   Diagnosis Date Noted  . Pressure injury of skin 10/01/2020  . Respiratory distress 09/30/2020  . Pleural effusion 07/07/2020  . Small cell lung cancer ([place]) 06/05/2020  . Goals of care, counseling/discussion 06/05/2020  . Abnormal chest x-ray   . Pneumonia 05/14/2020  . Pain in right foot 11/17/2019  . Hyponatremia 11/22/2017  . Nondisplaced fracture of left tibial spine, subsequent encounter for closed fracture with routine healing 08/05/2017  . Chronic pain of left knee 07/21/2017  . Presence of left artificial hip joint 05/30/2016  . Hip joint replacement status 03/27/2016  . S/P hip replacement 07/27/2015  . Osteoarthritis of right hip 08/01/2014  . Essential hypertension 05/31/2014  . Mixed hyperlipidemia 05/31/2014  . S/P AAA repair  05/31/2014  . Bradycardia 05/31/2014  . Murmur 05/31/2014   PCP:  Katherina Mires, MD Pharmacy:   [facility] [address] [place]), [place] - [address] [place] [address] Phone: [phone] Fax: [phone]     Social Determinants of Health (SDOH) Interventions    Readmission Risk Interventions No flowsheet data found.

## 2020-10-02 ENCOUNTER — Inpatient Hospital Stay (HOSPITAL_COMMUNITY)

## 2020-10-02 ENCOUNTER — Ambulatory Visit (HOSPITAL_COMMUNITY): Admission: RE | Admit: 2020-10-02 | Payer: Medicare Other | Source: Ambulatory Visit

## 2020-10-02 DIAGNOSIS — R0602 Shortness of breath: Secondary | ICD-10-CM | POA: Diagnosis not present

## 2020-10-02 DIAGNOSIS — Z515 Encounter for palliative care: Secondary | ICD-10-CM | POA: Diagnosis not present

## 2020-10-02 DIAGNOSIS — E876 Hypokalemia: Secondary | ICD-10-CM

## 2020-10-02 DIAGNOSIS — J948 Other specified pleural conditions: Secondary | ICD-10-CM | POA: Diagnosis not present

## 2020-10-02 DIAGNOSIS — E871 Hypo-osmolality and hyponatremia: Secondary | ICD-10-CM | POA: Diagnosis not present

## 2020-10-02 DIAGNOSIS — R0603 Acute respiratory distress: Secondary | ICD-10-CM | POA: Diagnosis not present

## 2020-10-02 DIAGNOSIS — E782 Mixed hyperlipidemia: Secondary | ICD-10-CM | POA: Diagnosis not present

## 2020-10-02 DIAGNOSIS — I10 Essential (primary) hypertension: Secondary | ICD-10-CM | POA: Diagnosis not present

## 2020-10-02 HISTORY — PX: IR PERC PLEURAL DRAIN W/INDWELL CATH W/IMG GUIDE: IMG5383

## 2020-10-02 LAB — CBC WITH DIFFERENTIAL/PLATELET
Abs Immature Granulocytes: 0.05 10*3/uL (ref 0.00–0.07)
Basophils Absolute: 0 10*3/uL (ref 0.0–0.1)
Basophils Relative: 0 %
Eosinophils Absolute: 0.1 10*3/uL (ref 0.0–0.5)
Eosinophils Relative: 1 %
HCT: 31.1 % — ABNORMAL LOW (ref 36.0–46.0)
Hemoglobin: 9.5 g/dL — ABNORMAL LOW (ref 12.0–15.0)
Immature Granulocytes: 1 %
Lymphocytes Relative: 10 %
Lymphs Abs: 0.8 10*3/uL (ref 0.7–4.0)
MCH: 28.4 pg (ref 26.0–34.0)
MCHC: 30.5 g/dL (ref 30.0–36.0)
MCV: 93.1 fL (ref 80.0–100.0)
Monocytes Absolute: 1.1 10*3/uL — ABNORMAL HIGH (ref 0.1–1.0)
Monocytes Relative: 14 %
Neutro Abs: 5.7 10*3/uL (ref 1.7–7.7)
Neutrophils Relative %: 74 %
Platelets: 346 10*3/uL (ref 150–400)
RBC: 3.34 MIL/uL — ABNORMAL LOW (ref 3.87–5.11)
RDW: 17.9 % — ABNORMAL HIGH (ref 11.5–15.5)
WBC: 7.8 10*3/uL (ref 4.0–10.5)
nRBC: 0 % (ref 0.0–0.2)

## 2020-10-02 LAB — COMPREHENSIVE METABOLIC PANEL
ALT: 11 U/L (ref 0–44)
AST: 24 U/L (ref 15–41)
Albumin: 2.8 g/dL — ABNORMAL LOW (ref 3.5–5.0)
Alkaline Phosphatase: 64 U/L (ref 38–126)
Anion gap: 12 (ref 5–15)
BUN: 8 mg/dL (ref 8–23)
CO2: 34 mmol/L — ABNORMAL HIGH (ref 22–32)
Calcium: 9.1 mg/dL (ref 8.9–10.3)
Chloride: 82 mmol/L — ABNORMAL LOW (ref 98–111)
Creatinine, Ser: 0.43 mg/dL — ABNORMAL LOW (ref 0.44–1.00)
GFR, Estimated: >60 mL/min (ref >60)
Glucose, Bld: 108 mg/dL — ABNORMAL HIGH (ref 70–99)
Potassium: 3.6 mmol/L (ref 3.5–5.1)
Sodium: 128 mmol/L — ABNORMAL LOW (ref 135–145)
Total Bilirubin: 0.6 mg/dL (ref 0.3–1.2)
Total Protein: 5.9 g/dL — ABNORMAL LOW (ref 6.5–8.1)

## 2020-10-02 LAB — PHOSPHORUS: Phosphorus: 2.3 mg/dL — ABNORMAL LOW (ref 2.5–4.6)

## 2020-10-02 LAB — MAGNESIUM: Magnesium: 1.1 mg/dL — ABNORMAL LOW (ref 1.7–2.4)

## 2020-10-02 MED ORDER — FUROSEMIDE 10 MG/ML IJ SOLN
40.0000 mg | Freq: Once | INTRAMUSCULAR | Status: AC
  Administered 2020-10-02: 40 mg via INTRAVENOUS
  Filled 2020-10-02: qty 4

## 2020-10-02 MED ORDER — MIDAZOLAM HCL 2 MG/2ML IJ SOLN
INTRAMUSCULAR | Status: AC
  Filled 2020-10-02: qty 2

## 2020-10-02 MED ORDER — FENTANYL CITRATE (PF) 100 MCG/2ML IJ SOLN
INTRAMUSCULAR | Status: AC
  Filled 2020-10-02: qty 2

## 2020-10-02 MED ORDER — IPRATROPIUM BROMIDE 0.02 % IN SOLN
0.5000 mg | Freq: Four times a day (QID) | RESPIRATORY_TRACT | Status: DC
  Administered 2020-10-02 – 2020-10-04 (×6): 0.5 mg via RESPIRATORY_TRACT
  Filled 2020-10-02 (×6): qty 2.5

## 2020-10-02 MED ORDER — GUAIFENESIN ER 600 MG PO TB12
1200.0000 mg | ORAL_TABLET | Freq: Two times a day (BID) | ORAL | Status: DC
  Administered 2020-10-02 – 2020-10-04 (×4): 1200 mg via ORAL
  Filled 2020-10-02 (×4): qty 2

## 2020-10-02 MED ORDER — LEVALBUTEROL HCL 0.63 MG/3ML IN NEBU
0.6300 mg | INHALATION_SOLUTION | Freq: Four times a day (QID) | RESPIRATORY_TRACT | Status: DC
  Administered 2020-10-02 – 2020-10-04 (×6): 0.63 mg via RESPIRATORY_TRACT
  Filled 2020-10-02 (×6): qty 3

## 2020-10-02 MED ORDER — MAGNESIUM SULFATE 4 GM/100ML IV SOLN
4.0000 g | Freq: Once | INTRAVENOUS | Status: AC
  Administered 2020-10-02: 4 g via INTRAVENOUS
  Filled 2020-10-02: qty 100

## 2020-10-02 MED ORDER — FENTANYL CITRATE (PF) 100 MCG/2ML IJ SOLN
INTRAMUSCULAR | Status: AC | PRN
  Administered 2020-10-02 (×2): 25 ug via INTRAVENOUS

## 2020-10-02 MED ORDER — K PHOS MONO-SOD PHOS DI & MONO 155-852-130 MG PO TABS
500.0000 mg | ORAL_TABLET | Freq: Two times a day (BID) | ORAL | Status: AC
  Administered 2020-10-02 (×2): 500 mg via ORAL
  Filled 2020-10-02 (×2): qty 2

## 2020-10-02 MED ORDER — LIDOCAINE HCL 1 % IJ SOLN
INTRAMUSCULAR | Status: AC
  Filled 2020-10-02: qty 20

## 2020-10-02 MED ORDER — MIDAZOLAM HCL 2 MG/2ML IJ SOLN
INTRAMUSCULAR | Status: AC | PRN
  Administered 2020-10-02: 0.5 mg via INTRAVENOUS

## 2020-10-02 NOTE — Procedures (Signed)
Interventional Radiology Procedure:   Indications: Malignant right hydropneumothorax and respiratory distress  Procedure: Placement of tunneled pleural catheter  Findings: Loculated right hydropneumothorax.   Removed 550 ml of blood tinged pleural fluid.  Complications: None     EBL: less than 10 ml  Plan: Portable CXR after tube placement.    Keaden Gunnoe R. Anselm Pancoast, MD  Pager: [phone]

## 2020-10-02 NOTE — Progress Notes (Signed)
Daily Progress Note   Patient Name: Mary Merritt       Date: 10/02/2020 DOB: 03/08/1928  Age: 87 y.o. MRN#: [id number] Attending Physician: Kerney Elbe, DO Primary Care Physician: Katherina Mires, MD Admit Date: 09/30/2020  Reason for Consultation/Follow-up: Non pain symptom management  Subjective: I met with Mary Merritt and her daughter.  She reports being more short of breath today than yesterday.   We discussed medications and she feels morphine does help with her shortness of breath.  She does not find a significant therapist pending oral and IV medication but currently thinks the IV medication is easier to take when she is acutely feeling short of breath.  We discussed for plan for Pleurx placement, hopefully later today.  Reviewed chest x-ray with her daughter per her request.  She appreciated further clarification on reaccumulation of fluid and reasoning for Pleurx.  Length of Stay: 1  Current Medications: Scheduled Meds:  . aspirin EC  81 mg Oral Daily  . enoxaparin (LOVENOX) injection  40 mg Subcutaneous Q24H  . escitalopram  10 mg Oral Daily  . guaiFENesin  1,200 mg Oral BID  . ipratropium  0.5 mg Nebulization Q6H  . levalbuterol  0.63 mg Nebulization Q6H  . metoprolol tartrate  12.5 mg Oral BID  . sodium chloride flush  3 mL Intravenous Q12H    Continuous Infusions:   PRN Meds: metoprolol tartrate, morphine, morphine injection, polyethylene glycol  Physical Exam         General: Alert, awake, in mild respiratory distress.  HEENT: No bruits, no goiter, no JVD Heart: Regular rate and rhythm. No murmur appreciated. Lungs: Decreased air movement, particularly on right Abdomen: Soft, nontender, nondistended, positive bowel sounds.  Ext: No significant  edema Skin: Warm and dry Neuro: Grossly intact, nonfocal.  Vital Signs: BP 114/68 (BP Location: Right Arm)   Pulse 90   Temp 97.8 F (36.6 C) (Oral)   Resp (!) 21   SpO2 95%  SpO2: SpO2: 95 % O2 Device: O2 Device: Nasal Cannula O2 Flow Rate: O2 Flow Rate (L/min): 5 L/min  Intake/output summary: No intake or output data in the 24 hours ending 10/02/20 2153 LBM: Last BM Date: 09/30/20 Baseline Weight:   Most recent weight:         Palliative Assessment/Data:  Flowsheet Rows   Flowsheet Row Most Recent Value  Intake Tab   Referral Department Hospitalist  Unit at Time of Referral Med/Surg Unit  Palliative Care Primary Diagnosis Cancer  Date Notified 09/30/20  Palliative Care Type New Palliative care  Reason for referral Non-pain Symptom  Date of Admission 09/30/20  Date first seen by Palliative Care 10/01/20  # of days Palliative referral response time 1 Day(s)  # of days IP prior to Palliative referral 0  Clinical Assessment   Palliative Performance Scale Score 40%  Psychosocial & Spiritual Assessment   Palliative Care Outcomes       Patient Active Problem List   Diagnosis Date Noted  . Pressure injury of skin 10/01/2020  . Respiratory distress 09/30/2020  . Pleural effusion 07/07/2020  . Small cell lung cancer ([place]) 06/05/2020  . Goals of care, counseling/discussion 06/05/2020  . Abnormal chest x-ray   . Pneumonia 05/14/2020  . Pain in right foot 11/17/2019  . Hyponatremia 11/22/2017  . Nondisplaced fracture of left tibial spine, subsequent encounter for closed fracture with routine healing 08/05/2017  . Chronic pain of left knee 07/21/2017  . Presence of left artificial hip joint 05/30/2016  . Hip joint replacement status 03/27/2016  . S/P hip replacement 07/27/2015  . Osteoarthritis of right hip 08/01/2014  . Essential hypertension 05/31/2014  . Mixed hyperlipidemia 05/31/2014  . S/P AAA repair 05/31/2014  . Bradycardia 05/31/2014  . Murmur  05/31/2014    Palliative Care Assessment & Plan   Patient Profile: 87 year old female on hospice with [facility] due to small cell lung cancer who was admitted secondary to shortness of breath from recurrent pleural effusion.  Plan for Pleurx catheter  Recommendations/Plan:  DNR/DNI  Plan for Pleurx catheter placement today  Morphine p.o. or IV as needed for shortness of breath.  Would recommend discharge home with morphine 5 mg p.o. every 4 hours as needed for shortness of breath.  Plan for discharge home with hospice when she is medically ready  Code Status:    Code Status Orders  (From admission, onward)         Start     Ordered   09/30/20 0808  Do not attempt resuscitation (DNR)  Continuous       Question Answer Comment  In the event of cardiac or respiratory ARREST Do not call a "code blue"   In the event of cardiac or respiratory ARREST Do not perform Intubation, CPR, defibrillation or ACLS   In the event of cardiac or respiratory ARREST Use medication by any route, position, wound care, and other measures to relive pain and suffering. May use oxygen, suction and manual treatment of airway obstruction as needed for comfort.      09/30/20 0807        Code Status History    Date Active Date Inactive Code Status Order ID Comments User Context   07/14/2020 1141 07/14/2020 2232 DNR [id number]  Mercy Riding, MD Inpatient   07/07/2020 2209 07/14/2020 1141 Partial Code [id number]  Elwyn Reach, MD ED   05/14/2020 1839 05/16/2020 2356 Partial Code [id number]  Gifford Shave, MD ED   04/07/2020 1321 04/07/2020 2233 Full Code [id number]  Angelia Mould, MD Inpatient   11/22/2017 1815 11/24/2017 1558 Full Code [id number]  Mariel Aloe, MD Inpatient   11/22/2017 0825 11/22/2017 1815 DNR [id number]  Mariel Aloe, MD Inpatient   03/27/2016 1837 03/29/2016 1611 Full Code [id number]  Leandrew Koyanagi, MD Inpatient   08/01/2014  1703 08/03/2014 1647 Full Code [id number]  Marianna Payment, MD Inpatient   Advance Care Planning Activity       Prognosis:   < 6 months  Discharge Planning:  Home with Hospice  Care plan was discussed with patient, daughter  Thank you for allowing the Palliative Medicine Team to assist in the care of this patient.   Total Time 20 Prolonged Time Billed No      Greater than 50%  of this time was spent counseling and coordinating care related to the above assessment and plan.  [name], MD  Please contact Palliative Medicine Team phone at [phone] for questions and concerns.

## 2020-10-02 NOTE — Consult Note (Signed)
Chief Complaint: Patient was seen in consultation today for right tunneled pleural catheter placement with moderate sedation.  Referring Physician(s): Raiford Noble, MD  Supervising Physician: Markus Daft  Patient Status: [facility] - In-pt  History of Present Illness: Mary Merritt is a 87 y.o. female with a past medical history significant for anxiety, arthritis, AAA, HTN, bradycardia, HLD and small cell lung cancer with recurrent malignant pleural effusions requiring multiple thoracentesis in IR (09/30/20, 08/14/20, 07/24/20, 07/10/20) who presented to [facility] ED on 09/30/20 with complaints of worsening dyspnea x 2 days. She had been seen 2 days prior by her oncologist and imaging showed marked disease progression, as such she was made DNR and referred to hospice. On admission she required supplemental oxygen via NRB and reported mild epigastric pain and productive cough. She underwent therapeutic thoracentesis in IR on 4/9 which yielded 900 mL of pleural fluid, post procedure CXR showed likely ex vacuo pneumothorax. She was admitted to hospice care on 4/8. She is currently being followed by the palliative care team during this admission. IR has been asked to place a tunneled pleural catheter for outpatient management of recurrent malignant right pleural effusion.   Mary Merritt seen at bedside today, she reports dyspnea but denies any other complaints. She states that the procedure has been discussed with her by other services and she would like to proceed with placement. She tells me she last had something to drink last night and something to eat the day before. Procedure also discussed with daughter Mary Merritt via telephone who is also in agreement to proceed.  Past Medical History:  Diagnosis Date  . AAA (abdominal aortic aneurysm) ([place])   . Anxiety   . Arthritis   . Dyspnea   . Dysrhythmia    bradycardia  . History of bronchitis   . Hyperlipidemia   . Hypertension   . Seasonal allergies      Past Surgical History:  Procedure Laterality Date  . ABDOMINAL AORTIC ANEURYSM REPAIR     2009, Dr. Scot Dock  . ABDOMINAL AORTOGRAM W/LOWER EXTREMITY Right 04/07/2020   Procedure: ABDOMINAL AORTOGRAM W/LOWER EXTREMITY;  Surgeon: Angelia Mould, MD;  Location: [place] [facility];  Service: Cardiovascular;  Laterality: Right;  . CARDIAC CATHETERIZATION     patient thinks it was in 09 with dr Scot Dock  . cataracts removed    . INCONTINENCE SURGERY    . IR THORACENTESIS ASP PLEURAL SPACE W/IMG GUIDE  06/07/2020  . IR THORACENTESIS ASP PLEURAL SPACE W/IMG GUIDE  06/14/2020  . IR THORACENTESIS ASP PLEURAL SPACE W/IMG GUIDE  07/24/2020  . IR THORACENTESIS ASP PLEURAL SPACE W/IMG GUIDE  08/14/2020  . THORACENTESIS N/A 05/16/2020   Procedure: Mathews Robinsons;  Surgeon: Lanier Clam, MD;  Location: [facility] ENDOSCOPY;  Service: Pulmonary;  Laterality: N/A;  . TOTAL HIP ARTHROPLASTY Right 08/01/2014   Procedure: RIGHT TOTAL HIP ARTHROPLASTY ANTERIOR APPROACH;  Surgeon: Marianna Payment, MD;  Location: [place];  Service: Orthopedics;  Laterality: Right;  . TOTAL HIP ARTHROPLASTY Left 03/27/2016   Procedure: LEFT TOTAL HIP ARTHROPLASTY ANTERIOR APPROACH;  Surgeon: Leandrew Koyanagi, MD;  Location: [place];  Service: Orthopedics;  Laterality: Left;    Allergies: Percocet [oxycodone-acetaminophen]  Medications: Prior to Admission medications   Medication Sig Start Date End Date Taking? Authorizing Provider  aspirin EC 81 MG tablet Take 81 mg by mouth daily.   Yes [provider]  diphenhydrAMINE (BENADRYL) 25 mg capsule Take 25 mg by mouth at bedtime as needed for  sleep.   Yes [provider]  escitalopram (LEXAPRO) 10 MG tablet Take 10 mg by mouth daily.   Yes [provider]  feeding supplement (ENSURE ENLIVE / ENSURE PLUS) LIQD Take 237 mLs by mouth 3 (three) times daily between meals. 07/14/20  Yes Mercy Riding, MD  HYDROcodone-acetaminophen (NORCO) 5-325 MG tablet  Take 1 tablet by mouth every 6 (six) hours as needed for moderate pain. 09/27/20  Yes Owens Shark, NP  ibuprofen (ADVIL) 200 MG tablet Take 400 mg by mouth every 4 (four) hours as needed for mild pain.   Yes [provider]  metoprolol tartrate (LOPRESSOR) 25 MG tablet Take 0.5 tablets (12.5 mg total) by mouth 2 (two) times daily. 12/27/19  Yes Hilty, Nadean Corwin, MD  polyethylene glycol (MIRALAX / GLYCOLAX) 17 g packet Take 17 g by mouth 2 (two) times daily as needed. Patient taking differently: Take 17 g by mouth 2 (two) times daily as needed for mild constipation. 07/14/20  Yes Mercy Riding, MD  chlorpheniramine-HYDROcodone (TUSSIONEX) 10-8 MG/5ML SUER Take 5 mLs by mouth every 12 (twelve) hours as needed for cough. 09/28/20   Owens Shark, NP     Family History  Problem Relation Age of Onset  . Heart failure Father   . Stroke Mother     Social History   Socioeconomic History  . Marital status: Widowed    Spouse name: Not on file  . Number of children: Not on file  . Years of education: Not on file  . Highest education level: Not on file  Occupational History  . Not on file  Tobacco Use  . Smoking status: Former Smoker    Packs/day: 1.00    Years: 30.00    Pack years: 30.00    Quit date: 05/31/1984    Years since quitting: 36.3  . Smokeless tobacco: Never Used  Vaping Use  . Vaping Use: Never used  Substance and Sexual Activity  . Alcohol use: No    Alcohol/week: 0.0 standard drinks  . Drug use: No  . Sexual activity: Not on file  Other Topics Concern  . Not on file  Social History Narrative  . Not on file   Social Determinants of Health   Financial Resource Strain: Not on file  Food Insecurity: Not on file  Transportation Needs: Not on file  Physical Activity: Not on file  Stress: Not on file  Social Connections: Not on file     Review of Systems: A 12 point ROS discussed and pertinent positives are indicated in the HPI above.  All other systems are  negative.  Review of Systems  Constitutional: Positive for fatigue. Negative for chills and fever.  Respiratory: Positive for cough and shortness of breath.   Cardiovascular: Negative for chest pain.  Gastrointestinal: Negative for abdominal pain, diarrhea, nausea and vomiting.  Skin: Negative for color change.  Neurological: Negative for dizziness, numbness and headaches.    Vital Signs: BP (!) 152/68 (BP Location: Right Arm)   Pulse 85   Temp 98.5 F (36.9 C) (Oral)   Resp 15   SpO2 97%   Physical Exam Vitals and nursing note reviewed.  Constitutional:      General: She is in acute distress.     Appearance: She is ill-appearing.     Comments: Able to speak in short sentences 2/2 dyspnea  HENT:     Mouth/Throat:     Mouth: Mucous membranes are moist.     Pharynx:  Oropharynx is clear. No oropharyngeal exudate or posterior oropharyngeal erythema.  Cardiovascular:     Rate and Rhythm: Normal rate and regular rhythm.  Pulmonary:     Comments: Dyspneic, decreased breath sounds bilaterally R>L, 6L O2 via [place]. Abdominal:     General: There is no distension.     Palpations: Abdomen is soft.     Tenderness: There is no abdominal tenderness.  Skin:    General: Skin is warm and dry.  Neurological:     Mental Status: She is alert and oriented to person, place, and time.  Psychiatric:        Mood and Affect: Mood normal.        Behavior: Behavior normal.        Thought Content: Thought content normal.        Judgment: Judgment normal.      MD Evaluation Airway: WNL Heart: WNL Abdomen: WNL Chest/ Lungs: Other (comments) Chest/ lungs comments: Decreased lung sounds bilaterally, 6L O2 via [place] ASA  Classification: 3 Mallampati/Airway Score: Two   Imaging: DG Chest 2 View  Result Date: 09/30/2020 CLINICAL DATA:  Shortness of breath x2 days. EXAM: CHEST - 2 VIEW COMPARISON:  August 14, 2020 FINDINGS: There is marked severity opacification of the right hemithorax with a  mild amount of aerated lung seen along the medial aspect of the mid and upper right lung. This is increased in severity when compared to the prior study. Moderate severity left basilar atelectasis and/or infiltrate is seen. There is a small left pleural effusion. No pneumothorax is identified. Mild to moderate severity enlargement of the cardiac silhouette is seen. An ill-defined lobulated opacity is seen along the superior mediastinum on the left. A chronic eighth right rib fracture is noted. IMPRESSION: 1. Large right pleural effusion with associated extensive right upper lobe, right middle lobe and right lower lobe atelectasis and/or infiltrate. 2. Moderate severity left basilar atelectasis and/or infiltrate with a small left pleural effusion. 3. Findings consistent with the patient's known prevascular/anterior mediastinal soft tissue mass. Electronically Signed   By: Virgina Norfolk M.D.   On: 09/30/2020 02:38   CT CHEST W CONTRAST  Result Date: 09/28/2020 CLINICAL DATA:  Small cell lung cancer restaging EXAM: CT CHEST WITH CONTRAST TECHNIQUE: Multidetector CT imaging of the chest was performed during intravenous contrast administration. CONTRAST:  31mL OMNIPAQUE IOHEXOL 300 MG/ML  SOLN COMPARISON:  07/07/2020 FINDINGS: Cardiovascular: Aortic atherosclerosis. Cardiomegaly. Left and right coronary artery calcifications. No pericardial effusion. Mediastinum/Nodes: Significant interval enlargement of a bulky prevascular/anterior mediastinal soft tissue mass which encases the left carotid and subclavian artery origins as well as the left brachiocephalic vein, measuring 6.4 x 5.4 cm, previously 4.5 x 3.4 cm when measured similarly (series 2, image 35). Additional enlarged lower cervical lymph nodes, incompletely imaged. There are newly enlarged abnormal right axillary lymph nodes, largest measuring 2.8 x 1.9 cm, previously 2.5 x 1.0 cm (series 2, image 53). Numerous enlarged right epicardial lymph nodes  measuring up to 1.8 x 1.0 cm, previously 1.5 x 0.9 cm (series 2, image 100). Thyroid gland, trachea, and esophagus demonstrate no significant findings. Lungs/Pleura: Large right pleural effusion and associated atelectasis or consolidation, significantly increased compared to prior examination. There is extensive, nodular pleural thickening throughout the right hemithorax and to a lesser extent the left hemithorax. There is dense, masslike consolidation of the suprahilar right upper lobe and the right upper lobe is almost completely consolidated. Discrete mass cannot easily be measured in the right lung. There are  multiple new and enlarged pulmonary nodules of the left lung, an index nodule of the anterior left upper lobe measuring 1.3 x 1.0 cm, previously no greater than 3 mm (series 4, image 31). Upper Abdomen: There are numerous new and enlarged bulky lymph nodes and soft tissue nodules about the right adrenal gland, celiac axis, and portacaval nodes, overall conglomerate in this vicinity measuring approximately 7.6 x 4.4 cm (series 2, image 145). Nodule of the left adrenal gland measuring 1.4 x 1.4 cm. Musculoskeletal: No chest wall mass or suspicious bone lesions identified. IMPRESSION: 1. Significant interval enlargement of a bulky prevascular/anterior mediastinal soft tissue mass which encases the left carotid and subclavian artery origins as well as the left brachiocephalic vein, as well as numerous lower cervical, epicardial, and upper abdominal lymph nodes. 2. Large right pleural effusion and associated atelectasis or consolidation, significantly increased compared to prior examination. Discrete mass cannot be easily measured in the right lung. 3. There is extensive, nodular pleural thickening throughout the right hemithorax and to a lesser extent the left hemithorax. 4. There are multiple new and enlarged pulmonary nodules of the left lung. 5. Nodule of the left adrenal gland measuring 1.4 x 1.4 cm, not  previously imaged and suspicious for adrenal metastasis. 6. Constellation of findings is consistent with interval worsening of lung malignancy, presumably of the right upper lobe, with extensive nodal, bilateral pleural, and pulmonary metastatic disease. 7. Cardiomegaly and coronary artery disease. Aortic Atherosclerosis (ICD10-I70.0). Electronically Signed   By: Eddie Candle M.D.   On: 09/28/2020 14:52   DG CHEST PORT 1 VIEW  Result Date: 10/02/2020 CLINICAL DATA:  Dyspnea EXAM: PORTABLE CHEST 1 VIEW COMPARISON:  10/01/2020 FINDINGS: Large right hydropneumothorax is again identified with progressive, now near complete, collapse of the right lung. Small left pleural effusion is unchanged. Multiple peripheral pulmonary nodules are again identified within the left lung. Left apical paramediastinal mass again identified. Cardiac size within normal limits. No acute bone abnormality. IMPRESSION: Large right hydropneumothorax with progressive, now near complete collapse of the right lung. Multiple pulmonary nodules within the left lung in keeping with pulmonary metastatic disease. Stable small left pleural effusion. Electronically Signed   By: Fidela Salisbury MD   On: 10/02/2020 05:18   DG CHEST PORT 1 VIEW  Result Date: 10/01/2020 CLINICAL DATA:  Pneumothorax.  Shortness of breath. EXAM: PORTABLE CHEST 1 VIEW COMPARISON:  09/30/2020 FINDINGS: Slight interval increase in the moderate right hydropneumothorax. Persistent retrocardiac left base collapse/consolidation with probable small left pleural effusion. Left paramediastinal opacity in the upper left chest is compatible with the patient's known mass lesion. Bones are diffusely demineralized. IMPRESSION: 1. Slight interval increase in the moderate right hydropneumothorax. 2. Otherwise stable exam. Electronically Signed   By: Misty Stanley M.D.   On: 10/01/2020 11:00   DG Chest Port 1 View  Result Date: 09/30/2020 CLINICAL DATA:  87 year old female with  recurrent right-sided pleural effusion secondary to known advanced small cell carcinoma, referred for thoracentesis EXAM: PORTABLE CHEST 1 VIEW COMPARISON:  CT 09/27/2020, chest x-ray 08/14/2020, chest x-ray 09/30/2020 FINDINGS: Cardiomediastinal silhouette likely unchanged, borderline enlarged heart border. Right-sided hydropneumothorax status post right-sided thoracentesis with significant reduction in the volume right-sided pleural fluid. Consolidative changes in the right lung, compatible with known pleural/parenchymal metastatic disease. Opacity at the left lung apex, suprahilar region, compatible with known mediastinal/pleural involvement on prior CT. Nodular changes of the left lung compatible with known malignancy and better seen on prior CT. IMPRESSION: Right-sided ex vacuo hydropneumothorax is favored  after thoracentesis performed. Consolidative changes of the right lung in this patient with known metastatic small cell carcinoma. Left apical mass with nodular changes, compatible with known malignancy. Electronically Signed   By: Corrie Mckusick D.O.   On: 09/30/2020 12:01   US THORACENTESIS ASP PLEURAL SPACE W/IMG GUIDE  Result Date: 09/30/2020 INDICATION: 87 year old female with a history of small cell carcinoma and recurrent right-sided pleural effusion EXAM: ULTRASOUND GUIDED RIGHT THORACENTESIS MEDICATIONS: None. COMPLICATIONS: None PROCEDURE: An ultrasound guided thoracentesis was thoroughly discussed with the patient and questions answered. The benefits, risks, alternatives and complications were also discussed. The patient understands and wishes to proceed with the procedure. Written consent was obtained. Ultrasound was performed to localize and mark an adequate pocket of fluid in the right chest. The area was then prepped and draped in the normal sterile fashion. 1% Lidocaine was used for local anesthesia. Under ultrasound guidance a 8 Fr Safe-T-Centesis catheter was introduced. Thoracentesis was  performed. The catheter was removed when the patient complained of some chest discomfort and a dressing applied. FINDINGS: A total of approximately 900 cc of thin amber fluid was removed. IMPRESSION: Status post ultrasound-guided right-sided thoracentesis. Signed, Dulcy Fanny. Dellia Nims, RPVI [facility] Electronically Signed   By: Corrie Mckusick D.O.   On: 09/30/2020 13:11    Labs:  CBC: Recent Labs    09/26/20 1417 09/30/20 0145 10/01/20 0327 10/02/20 0323  WBC 9.3 8.8 9.3 7.8  HGB 9.9* 10.0* 9.6* 9.5*  HCT 33.0* 31.4* 31.1* 31.1*  PLT 382 376 301 346    COAGS: Recent Labs    07/07/20 1837 09/30/20 0145  INR 1.0 1.0    BMP: Recent Labs    09/30/20 0145 10/01/20 0327 10/01/20 1431 10/02/20 0323  NA 125* 125* 129* 128*  K 4.7 2.9* 3.2* 3.6  CL 80* 80* 82* 82*  CO2 33* 31 36* 34*  GLUCOSE 114* 93 140* 108*  BUN 9 7* 8 8  CALCIUM 8.6* 8.4* 8.6* 9.1  CREATININE 0.56 0.37* 0.65 0.43*  GFRNONAA >60 >60 >60 >60    LIVER FUNCTION TESTS: Recent Labs    08/30/20 1113 09/26/20 1417 09/30/20 0145 10/02/20 0323  BILITOT 0.3 0.3 2.1* 0.6  AST 16 18 58* 24  ALT 9 8 12 11   ALKPHOS 91 67 72 64  PROT 6.4* 6.4* 6.2* 5.9*  ALBUMIN 2.5* 3.4* 2.9* 2.8*    TUMOR MARKERS: No results for input(s): AFPTM, CEA, CA199, CHROMGRNA in the last 8760 hours.  Assessment and Plan:  87 y/o F with history of small cell lung cancer and recurrent malignant pleural effusion s/p multiple thoracentesis in IR who presented to [facility] ED on 4/9 with complaints of worsening dyspnea, imaging showed recurrent large right and small left pleural effusion with associated atelectasis. She was admitted and underwent thoracentesis that same day with Dr. Earleen Newport which yielded 900 mL of pleural fluid, post procedure CXR showed likely ex vacuo pneumothorax and has been monitored with serial CXRs - most recent imaging this AM showed large right  hydropneumothorax with now near complete collapse of the right lung. IR has been asked to place a tunneled right pleural catheter for outpatient symptom management. Patient is currently DNR and was admitted to hospice on 4/8 - she is being followed by [facility] and palliative care who are both in agreement with pleural catheter placement.  Discussed procedure with both patient and her daughter today - they are in agreement to proceed. Patient tentatively planned  for placement today in IR, per patient last PO intake last night and RN has held AM Lovenox. Afebrile, COVID (-) 4/9, WBC 7.8, hgb 9.5, plt 346, INR 1.0.   Risks and benefits discussed with the patient including bleeding, infection, damage to adjacent structures, malfunction of the catheter with need for additional procedures.  All of the patient's questions were answered, patient is agreeable to proceed.  Consent signed and in IR control room.   Thank you for this interesting consult.  I greatly enjoyed meeting Mary Merritt and look forward to participating in their care.  A copy of this report was sent to the requesting provider on this date.  Electronically Signed: Joaquim Nam, PA-C 10/02/2020, 9:54 AM   I spent a total of 40 Minutes in face to face in clinical consultation, greater than 50% of which was counseling/coordinating care for tunneled right pleural catheter placement.

## 2020-10-02 NOTE — Progress Notes (Signed)
PROGRESS NOTE    Mary Merritt  FMB:[id number] DOB: [birth year] DOA: 09/30/2020 PCP: Katherina Mires, MD   Brief Narrative:  The patient is a 87 year old elderly chronically ill-appearing Caucasian female with a past medical history significant for but not limited to AAA, bradycardia, hypertension, hyperlipidemia, chronic hypoxic respiratory failure on 2 to 4 L of supplemental oxygen at home, history of small cell cancer status post chemotherapy and recurrent malignant pleural effusions requiring multiple thoracentesis who presented to the ED with shortness breath for 2 days and had to be placed on nonrebreather.  She was recently seen by her oncologist on 09/28/2020 for review of her CT scans which unfortunately showed marked disease progression.  She is at that time referred for hospice care and made DNR at that time.  She is also referred for palliative thoracentesis to be done on Monday however symptoms became so severe that she presented to ED having this done.  She also admitted to epigastric pain and mildly productive cough without hemoptysis.  In the ED she was placed on nonrebreather and labs were reviewed and she had a sodium 125, potassium 4.7.  She was afebrile and had no leukocytosis.  Her chest x-ray did show very large right sided pleural effusion with extensive right-sided atelectasis and moderate left basilar atelectasis and/or infiltrate with small pleural effusions and findings consistent with known mediastinal soft tissue mass.  TRH was asked to admit this patient for her respiratory distress in the setting of chronic hypoxic respiratory failure due to recurrent malignant pleural effusions.  Interventional radiology was consulted for a thoracentesis and she underwent thoracentesis however subsequently after thoracentesis repeat chest x-ray showed pneumothorax likely ex vacuo.  She is tolerating her home supplemental oxygen but was observed overnight and today she is more tachypneic and repeat  chest x-ray shows worsening pleural effusion.  Interventional radiology was reconsulted for evaluation of thorax catheter and palliative care evaluated as well.  The plan is for a pleural catheter placement today in IR.  PT OT and are recommending SNF  Assessment & Plan:   Principal Problem:   Respiratory distress Active Problems:   Essential hypertension   Mixed hyperlipidemia   Hyponatremia   Small cell lung cancer (HCC)   Pleural effusion   Pressure injury of skin  Acute Respiratory distress in the setting of chronic hypoxic respiratory failure secondary to recurrent malignant right pleural effusion and extensive right-sided and moderate left atelectasis  Small Cell Lung Cancer -IR thoracentesis yielded 900 cc but repeat CXR after showed "Right-sided ex vacuo hydropneumothorax is favored after thoracentesis performed.  Consolidative changes of the right lung in this patient with known metastatic small cell carcinoma. Left apical mass with nodular changes, compatible with known malignancy." -SpO2: 97 % O2 Flow Rate (L/min): 5 L/min  -Repeat CXR yesterday AM "Slight interval increase in the moderate right hydropneumothorax. Persistent retrocardiac left base collapse/consolidation with probable small left pleural effusion. Left paramediastinal opacity in the upper left chest is compatible with the patient's known mass lesion. Bones are diffusely demineralized." -CXR this AM showed "Large right hydropneumothorax with progressive, now near complete collapse of the right lung. Multiple pulmonary nodules within the left lung in keeping with pulmonary metastatic disease. Stable small left pleural effusion." -Will ask IR to see if the patient for Pleur-X Catheter drainage and plan is to be done today  -Pain management as needed and Palliative Consulted and she has Liquid Morphine and IV Morphine 2 mg IV q3hprn  Pain and SOB -  Recently referred for hospice however has not had this set up just yet.   Palliative care consulted while inpatient to potentially help the process along  Sinus Tachycardia  Hypertension -See Below -HR was ranging from 85-108  Hypokalemia, improving  -Patient's K+ went from 4.7 -> 2.9 -> 3.2 -> 3.6 -Replete with po KCl 40 mEQ and po K Phos 500 mg BID x2 -Check Mag Level and was 1.1 -Continue to Monitor and Replete as Necessary  -Repeat CMP in the AM   Hypomagnesemia -Patient's Mag Level was 1.1 -Replete with IV Mag Sulfate 4 Grams -Continue to Monitor and Replete as Necessary -Repeat Mag Level in the AM   Hyponatremia -Sodium has gone from 125 -> 129 -> 128 -Likely from volume overload and possibly from poor p.o. intake -Continue Hold off on fluids for now to prevent further volume overload and follow-up after thoracentesis -Continue to Monitor and Trend  Normocytic Anemia -Patient's Hgb/Hct went from 10.0/31.4 -> 9.6/31.1 -> 9.5/31.1 -In the setting of Malignancy -Continue to Monitor for S/Sx of Bleeding; No overt bleeding -Repeat CBC in the AM  Hypertension -C/w As needed IV Metoprolol Tartrate 5 mg IV q6hprn HBP -C/w Metoprolol 12.5 mg po BID  Depression and Anxiety -Continue home Escitalopram 10 mg po Daily  Hyperlipidemia -Not on home meds  GOC: DNR, poA -Palliative Care consulted and [place] Discussion happening -She will require a Pleur-X Catheter and Palliative Care has added Morphine Solution 5 mg po q3hprn Pain and SOB  DVT prophylaxis: Enoxaparin 40 mg sq q24h Code Status: DO NOT RESUSCITATE  Family Communication: No family present at bedside  Disposition Plan: Pending further clinical improvement and placement of Pleur-X  Status is: Observation  The patient will require care spanning > 2 midnights and should be moved to inpatient because: Unsafe d/c plan, IV treatments appropriate due to intensity of illness or inability to take PO and Inpatient level of care appropriate due to severity of illness  Dispo: The patient is  from: Home              Anticipated d/c is to: Home with Hospice              Patient currently is not medically stable to d/c.   Difficult to place patient No  Consultants:   Palliative Care Medicine  Interventional Radiology   Procedures:  Sound guided right sided thoracentesis that removed 900 cc but due to patient discomfort she asked the physician to stop aspiration  Antimicrobials:  Anti-infectives (From admission, onward)   None        Subjective: Seen and examined at bedside and she is feeling very dyspneic today and complaining of shortness of breath and states that she is not feeling very well at all.  Her chest x-ray shows almost complete occlusion on the right side and so she is scheduled for Pleurx catheter later today.  Palliative care evaluated and added morphine for dyspnea.  No other concerns or complaints at this time.  Objective: Vitals:   10/01/20 1351 10/01/20 2151 10/02/20 0455 10/02/20 0842  BP: 124/62 122/68 (!) 152/68   Pulse: (!) 108 92 91 85  Resp: 18 20 15    Temp: 98 F (36.7 C) 99 F (37.2 C) 98.5 F (36.9 C)   TempSrc: Oral Oral Oral   SpO2: 97% 93% 98% 97%    Intake/Output Summary (Last 24 hours) at 10/02/2020 1358 Last data filed at 10/01/2020 1830 Gross per 24 hour  Intake 600 ml  Output --  Net 600 ml   There were no vitals filed for this visit.  Examination: Physical Exam:  Constitutional: WN/WD chronically ill-appearing Caucasian elderly female in moderate respiratory distress Eyes: Lids and conjunctivae normal, sclerae anicteric  ENMT: External Ears, Nose appear normal. Grossly normal hearing. Neck: Appears normal, supple, no cervical masses, normal ROM, no appreciable thyromegaly; no JVD Respiratory: Diminished to auscultation bilaterally with diminished breath sounds at the bases and coarse sounds and crackles No appreciable wheezing. Increased respiratory effort and patient is tachypenic.Wearing supplemental O2 via  [place] Cardiovascular: RRR, no murmurs / rubs / gallops. S1 and S2 auscultated. No extremity edema.  Abdomen: Soft, non-tender, non-distended. Bowel sounds positive.  GU: Deferred. Musculoskeletal: No clubbing / cyanosis of digits/nails. No joint deformity upper and lower extremities.  Skin: No rashes, lesions, ulcers on a limited skin evaluation. No induration; Warm and dry.  Neurologic: CN 2-12 grossly intact with no focal deficits. Romberg sign and cerebellar reflexes not assessed.  Psychiatric: Normal judgment and insight. Alert and oriented x 3. Anxious mood and appropriate affect.   Data Reviewed: I have personally reviewed following labs and imaging studies  CBC: Recent Labs  Lab 09/26/20 1417 09/30/20 0145 10/01/20 0327 10/02/20 0323  WBC 9.3 8.8 9.3 7.8  NEUTROABS 7.5 7.1  --  5.7  HGB 9.9* 10.0* 9.6* 9.5*  HCT 33.0* 31.4* 31.1* 31.1*  MCV 90.9 89.7 91.5 93.1  PLT 382 376 301 025   Basic Metabolic Panel: Recent Labs  Lab 09/26/20 1417 09/30/20 0145 10/01/20 0327 10/01/20 1431 10/02/20 0323  NA 133* 125* 125* 129* 128*  K 3.6 4.7 2.9* 3.2* 3.6  CL 87* 80* 80* 82* 82*  CO2 37* 33* 31 36* 34*  GLUCOSE 107* 114* 93 140* 108*  BUN 11 9 7* 8 8  CREATININE 0.56 0.56 0.37* 0.65 0.43*  CALCIUM 9.3 8.6* 8.4* 8.6* 9.1  MG  --   --   --   --  1.1*  PHOS  --   --   --   --  2.3*   GFR: Estimated Creatinine Clearance: 42.1 mL/min (A) (by C-G formula based on SCr of 0.43 mg/dL (L)). Liver Function Tests: Recent Labs  Lab 09/26/20 1417 09/30/20 0145 10/02/20 0323  AST 18 58* 24  ALT 8 12 11   ALKPHOS 67 72 64  BILITOT 0.3 2.1* 0.6  PROT 6.4* 6.2* 5.9*  ALBUMIN 3.4* 2.9* 2.8*   No results for input(s): LIPASE, AMYLASE in the last 168 hours. No results for input(s): AMMONIA in the last 168 hours. Coagulation Profile: Recent Labs  Lab 09/30/20 0145  INR 1.0   Cardiac Enzymes: No results for input(s): CKTOTAL, CKMB, CKMBINDEX, TROPONINI in the last 168 hours. BNP  (last 3 results) No results for input(s): PROBNP in the last 8760 hours. HbA1C: No results for input(s): HGBA1C in the last 72 hours. CBG: No results for input(s): GLUCAP in the last 168 hours. Lipid Profile: No results for input(s): CHOL, HDL, LDLCALC, TRIG, CHOLHDL, LDLDIRECT in the last 72 hours. Thyroid Function Tests: No results for input(s): TSH, T4TOTAL, FREET4, T3FREE, THYROIDAB in the last 72 hours. Anemia Panel: No results for input(s): VITAMINB12, FOLATE, FERRITIN, TIBC, IRON, RETICCTPCT in the last 72 hours. Sepsis Labs: Recent Labs  Lab 09/30/20 0142  LATICACIDVEN 1.6    Recent Results (from the past 240 hour(s))  SARS CORONAVIRUS 2 (TAT 6-24 HRS) Nasopharyngeal Nasopharyngeal Swab     Status: None   Collection Time: 09/29/20  1:31 PM  Specimen: Nasopharyngeal Swab  Result Value Ref Range Status   SARS Coronavirus 2 NEGATIVE NEGATIVE Final    Comment: (NOTE) SARS-CoV-2 target nucleic acids are NOT DETECTED.  The SARS-CoV-2 RNA is generally detectable in upper and lower respiratory specimens during the acute phase of infection. Negative results do not preclude SARS-CoV-2 infection, do not rule out co-infections with other pathogens, and should not be used as the sole basis for treatment or other patient management decisions. Negative results must be combined with clinical observations, patient history, and epidemiological information. The expected result is Negative.  Fact Sheet for Patients: SugarRoll.be  Fact Sheet for Healthcare Providers: https://www.woods-mathews.com/  This test is not yet approved or cleared by the [place] FDA and  has been authorized for detection and/or diagnosis of SARS-CoV-2 by FDA under an Emergency Use Authorization (EUA). This EUA will remain  in effect (meaning this test can be used) for the duration of the COVID-19 declaration under Se ction 564(b)(1) of the Act, 21  U.S.C. section 360bbb-3(b)(1), unless the authorization is terminated or revoked sooner.  Performed at [facility], [place] [address] [place],  [address]   Resp Panel by RT-PCR (Flu A&B, Covid) Nasopharyngeal Swab     Status: None   Collection Time: 09/30/20  2:26 AM   Specimen: Nasopharyngeal Swab; Nasopharyngeal(NP) swabs in vial transport medium  Result Value Ref Range Status   SARS Coronavirus 2 by RT PCR NEGATIVE NEGATIVE Final    Comment: (NOTE) SARS-CoV-2 target nucleic acids are NOT DETECTED.  The SARS-CoV-2 RNA is generally detectable in upper respiratory specimens during the acute phase of infection. The lowest concentration of SARS-CoV-2 viral copies this assay can detect is 138 copies/mL. A negative result does not preclude SARS-Cov-2 infection and should not be used as the sole basis for treatment or other patient management decisions. A negative result may occur with  improper specimen collection/handling, submission of specimen other than nasopharyngeal swab, presence of viral mutation(s) within the areas targeted by this assay, and inadequate number of viral copies(<138 copies/mL). A negative result must be combined with clinical observations, patient history, and epidemiological information. The expected result is Negative.  Fact Sheet for Patients:  EntrepreneurPulse.com.au  Fact Sheet for Healthcare Providers:  IncredibleEmployment.be  This test is no t yet approved or cleared by the [place] FDA and  has been authorized for detection and/or diagnosis of SARS-CoV-2 by FDA under an Emergency Use Authorization (EUA). This EUA will remain  in effect (meaning this test can be used) for the duration of the COVID-19 declaration under Section 564(b)(1) of the Act, 21 U.S.C.section 360bbb-3(b)(1), unless the authorization is terminated  or revoked sooner.       Influenza A by PCR NEGATIVE NEGATIVE Final    Influenza B by PCR NEGATIVE NEGATIVE Final    Comment: (NOTE) The Xpert Xpress SARS-CoV-2/FLU/RSV plus assay is intended as an aid in the diagnosis of influenza from Nasopharyngeal swab specimens and should not be used as a sole basis for treatment. Nasal washings and aspirates are unacceptable for Xpert Xpress SARS-CoV-2/FLU/RSV testing.  Fact Sheet for Patients: EntrepreneurPulse.com.au  Fact Sheet for Healthcare Providers: IncredibleEmployment.be  This test is not yet approved or cleared by the [place] FDA and has been authorized for detection and/or diagnosis of SARS-CoV-2 by FDA under an Emergency Use Authorization (EUA). This EUA will remain in effect (meaning this test can be used) for the duration of the COVID-19 declaration under Section 564(b)(1) of the Act, 21 U.S.C.  section 360bbb-3(b)(1), unless the authorization is terminated or revoked.  Performed at [facility], [place] [address], [place], [place] [address]      RN Pressure Injury Documentation: Pressure Injury 09/30/20 Sacrum Mid Stage 2 -  Partial thickness loss of dermis presenting as a shallow open injury with a red, pink wound bed without slough. 1cmx1cmx0cm pink (Active)  09/30/20 1600  Location: Sacrum  Location Orientation: Mid  Staging: Stage 2 -  Partial thickness loss of dermis presenting as a shallow open injury with a red, pink wound bed without slough.  Wound Description (Comments): 1cmx1cmx0cm pink  Present on Admission: Yes     Estimated body mass index is 23.63 kg/m as calculated from the following:   Height as of 09/28/20: 5\' 5"  (1.651 m).   Weight as of 09/28/20: 64.4 kg.  Malnutrition Type:   Malnutrition Characteristics:   Nutrition Interventions:   Radiology Studies: DG CHEST PORT 1 VIEW  Result Date: 10/02/2020 CLINICAL DATA:  Dyspnea EXAM: PORTABLE CHEST 1 VIEW COMPARISON:  10/01/2020 FINDINGS: Large right hydropneumothorax  is again identified with progressive, now near complete, collapse of the right lung. Small left pleural effusion is unchanged. Multiple peripheral pulmonary nodules are again identified within the left lung. Left apical paramediastinal mass again identified. Cardiac size within normal limits. No acute bone abnormality. IMPRESSION: Large right hydropneumothorax with progressive, now near complete collapse of the right lung. Multiple pulmonary nodules within the left lung in keeping with pulmonary metastatic disease. Stable small left pleural effusion. Electronically Signed   By: Fidela Salisbury MD   On: 10/02/2020 05:18   DG CHEST PORT 1 VIEW  Result Date: 10/01/2020 CLINICAL DATA:  Pneumothorax.  Shortness of breath. EXAM: PORTABLE CHEST 1 VIEW COMPARISON:  09/30/2020 FINDINGS: Slight interval increase in the moderate right hydropneumothorax. Persistent retrocardiac left base collapse/consolidation with probable small left pleural effusion. Left paramediastinal opacity in the upper left chest is compatible with the patient's known mass lesion. Bones are diffusely demineralized. IMPRESSION: 1. Slight interval increase in the moderate right hydropneumothorax. 2. Otherwise stable exam. Electronically Signed   By: Misty Stanley M.D.   On: 10/01/2020 11:00   Scheduled Meds: . aspirin EC  81 mg Oral Daily  . enoxaparin (LOVENOX) injection  40 mg Subcutaneous Q24H  . escitalopram  10 mg Oral Daily  . metoprolol tartrate  12.5 mg Oral BID  . phosphorus  500 mg Oral BID  . sodium chloride flush  3 mL Intravenous Q12H   Continuous Infusions:   LOS: 1 day   Kerney Elbe, DO [facility] PAGER is on AMION  If 7PM-7AM, please contact night-coverage [email or web address]

## 2020-10-02 NOTE — Progress Notes (Signed)
PT Cancellation Note  Patient Details Name: Mary Merritt MRN: [id number] DOB: August 28, 1927   Cancelled Treatment:    Reason Eval/Treat Not Completed: Patient at procedure or test/unavailable. Pt to go for procedure with IR. Will hold PT for now and will check back as schedule allows. Thanks.    [facility]  Office: [phone] Pager: [phone]

## 2020-10-02 NOTE — Progress Notes (Signed)
Mary Merritt [address] Mary Merritt) hospitalized hospice patient visit  Mary Merritt is a current hospice patient, admitted 09/29/20, with a terminal diagnosis of small cell lung cancer. Family activated EMS after pt began to complain of severe shortness of breath with severe back pain. Family contacted hospice before activating 911 but declined on call nurse visit in favor of transporting to emergency department. The patient was admitted to [facility] on 09/30/20 with a diagnosis of respiratory distress. Per Dr. Konrad Dolores with [facility] this is a related hospital admission.  Report exchanged with hospital staff. Patient to go to IR for placement of pleurx drain at some point today. Mary Merritt and Mary Merritt at the bedside. Discussed that Mary Merritt clinically looks worse today, her CXR reveals near collapse of her right lung. Patient sleeps through much of the conversation. Discussed with family possible d/c options, for now they would like to take her back home and possibly hire additional paid caregivers.  V/S: 98.5 oral, 152/58, HR 91, RR 20, SPO2 98% on O2 via [place] @ 5 lpm I&O: 600/0 Labs: Na 128, chloride 82, phos 2.3, mag 1.1 Diagnostics: CXR - Large right hydropneumothorax with progressive, now near complete collapse of the right lung IVs/PRNs: mag sulfate 4 mg IV x 1, morphine 2 mg IV x 3 within last 12 hours  Problem List: - respiratory distress secondary to pleural effusion - thoracentesis on Saturday, O2 increased for comfort, morphine for dysnpea and pain. Plan for pleurx drain to be placed Monday for relief of dysnpea.  - small cell lung cancer - was admitted onto hospice services in the home on 4/8 for this, patient then transported later that evening to the ED for dyspnea and severe pain - hyponatremia - today 129, no IVFs, may be chronic in nature  D/C planning: Ongoing. Pt lives alone, has a caregiver most hours of the day and her Mary Merritt Mary Merritt stays each night. Mary Merritt is  concerned about her current mobility as she seems to have declined significantly. PT/OT to see on Tuesday to help with determining if she can get out of bed > BSC, which will help determine if she can d/c back home. Family also requests hospital bed, [facility] will order once PT/OT eval completed and the plan is definitely to return home with hospice. GOC: clear, comfort focused Family: updated at the bedside IDT: updated  Venia Carbon RN, BSN, [facility] Liaison

## 2020-10-02 NOTE — Progress Notes (Signed)
OT Cancellation Note  Patient Details Name: Mary Merritt MRN: [id number] DOB: 02/15/1928   Cancelled Treatment:    Reason Eval/Treat Not Completed: Medical issues which prohibited therapy;Patient not medically ready.  Met with pt's home hospice nurse outside pt's room.  Pt began hospice care just this past Friday. Hospice nurse reports that pt has been too short of breath and in too much pain to mobilize out of bed since onset of worsening dyspnea.   Pt has pleural catheter placement procedure pending today. Last time Hospice nurse was told would be ~1400 today.  Requested that PT/OT hold for today and check back in tomorrow, after procedure is completed with hope that pt will be better able to tolerate light activity.  Hospice nurse clarified that an evaluation by PT/OT will be helpful in determining whether pt can return home for continued home hospice care, vs transition to a residential facility with hospice.    Jennifer  Cason 10/02/2020, 11:56 AM 

## 2020-10-02 NOTE — Plan of Care (Signed)

## 2020-10-03 ENCOUNTER — Inpatient Hospital Stay (HOSPITAL_COMMUNITY)

## 2020-10-03 DIAGNOSIS — Z7189 Other specified counseling: Secondary | ICD-10-CM | POA: Diagnosis not present

## 2020-10-03 DIAGNOSIS — E871 Hypo-osmolality and hyponatremia: Secondary | ICD-10-CM | POA: Diagnosis not present

## 2020-10-03 DIAGNOSIS — I10 Essential (primary) hypertension: Secondary | ICD-10-CM | POA: Diagnosis not present

## 2020-10-03 DIAGNOSIS — R0603 Acute respiratory distress: Secondary | ICD-10-CM | POA: Diagnosis not present

## 2020-10-03 DIAGNOSIS — Z515 Encounter for palliative care: Secondary | ICD-10-CM | POA: Diagnosis not present

## 2020-10-03 DIAGNOSIS — E782 Mixed hyperlipidemia: Secondary | ICD-10-CM | POA: Diagnosis not present

## 2020-10-03 DIAGNOSIS — R0602 Shortness of breath: Secondary | ICD-10-CM | POA: Diagnosis not present

## 2020-10-03 LAB — COMPREHENSIVE METABOLIC PANEL
ALT: 12 U/L (ref 0–44)
AST: 23 U/L (ref 15–41)
Albumin: 2.9 g/dL — ABNORMAL LOW (ref 3.5–5.0)
Alkaline Phosphatase: 67 U/L (ref 38–126)
Anion gap: 14 (ref 5–15)
BUN: 10 mg/dL (ref 8–23)
CO2: 35 mmol/L — ABNORMAL HIGH (ref 22–32)
Calcium: 9 mg/dL (ref 8.9–10.3)
Chloride: 81 mmol/L — ABNORMAL LOW (ref 98–111)
Creatinine, Ser: 0.48 mg/dL (ref 0.44–1.00)
GFR, Estimated: >60 mL/min (ref >60)
Glucose, Bld: 152 mg/dL — ABNORMAL HIGH (ref 70–99)
Potassium: 3.9 mmol/L (ref 3.5–5.1)
Sodium: 130 mmol/L — ABNORMAL LOW (ref 135–145)
Total Bilirubin: 0.6 mg/dL (ref 0.3–1.2)
Total Protein: 6.2 g/dL — ABNORMAL LOW (ref 6.5–8.1)

## 2020-10-03 LAB — CBC WITH DIFFERENTIAL/PLATELET
Abs Immature Granulocytes: 0.08 10*3/uL — ABNORMAL HIGH (ref 0.00–0.07)
Basophils Absolute: 0 10*3/uL (ref 0.0–0.1)
Basophils Relative: 0 %
Eosinophils Absolute: 0 10*3/uL (ref 0.0–0.5)
Eosinophils Relative: 0 %
HCT: 34.2 % — ABNORMAL LOW (ref 36.0–46.0)
Hemoglobin: 10.2 g/dL — ABNORMAL LOW (ref 12.0–15.0)
Immature Granulocytes: 1 %
Lymphocytes Relative: 3 %
Lymphs Abs: 0.5 10*3/uL — ABNORMAL LOW (ref 0.7–4.0)
MCH: 28.2 pg (ref 26.0–34.0)
MCHC: 29.8 g/dL — ABNORMAL LOW (ref 30.0–36.0)
MCV: 94.5 fL (ref 80.0–100.0)
Monocytes Absolute: 1.3 10*3/uL — ABNORMAL HIGH (ref 0.1–1.0)
Monocytes Relative: 8 %
Neutro Abs: 13 10*3/uL — ABNORMAL HIGH (ref 1.7–7.7)
Neutrophils Relative %: 88 %
Platelets: 357 10*3/uL (ref 150–400)
RBC: 3.62 MIL/uL — ABNORMAL LOW (ref 3.87–5.11)
RDW: 18.1 % — ABNORMAL HIGH (ref 11.5–15.5)
WBC: 14.9 10*3/uL — ABNORMAL HIGH (ref 4.0–10.5)
nRBC: 0 % (ref 0.0–0.2)

## 2020-10-03 LAB — MAGNESIUM: Magnesium: 1.5 mg/dL — ABNORMAL LOW (ref 1.7–2.4)

## 2020-10-03 LAB — PHOSPHORUS: Phosphorus: 3.9 mg/dL (ref 2.5–4.6)

## 2020-10-03 MED ORDER — FUROSEMIDE 10 MG/ML IJ SOLN
INTRAMUSCULAR | Status: AC
  Filled 2020-10-03: qty 8

## 2020-10-03 MED ORDER — SODIUM CHLORIDE 0.9 % IV SOLN
2.0000 g | INTRAVENOUS | Status: DC
  Administered 2020-10-03 – 2020-10-04 (×2): 2 g via INTRAVENOUS
  Filled 2020-10-03 (×2): qty 2

## 2020-10-03 MED ORDER — SODIUM CHLORIDE 0.9 % IV SOLN
500.0000 mg | INTRAVENOUS | Status: DC
  Administered 2020-10-03: 500 mg via INTRAVENOUS
  Filled 2020-10-03 (×2): qty 500

## 2020-10-03 MED ORDER — FUROSEMIDE 10 MG/ML IJ SOLN
60.0000 mg | Freq: Once | INTRAMUSCULAR | Status: AC
  Administered 2020-10-03: 60 mg via INTRAVENOUS

## 2020-10-03 MED ORDER — MAGNESIUM SULFATE 4 GM/100ML IV SOLN
4.0000 g | Freq: Once | INTRAVENOUS | Status: AC
  Administered 2020-10-03: 4 g via INTRAVENOUS
  Filled 2020-10-03: qty 100

## 2020-10-03 NOTE — Progress Notes (Signed)
OT Cancellation and discharge Note  Patient Details Name: Mary Merritt MRN: [id number] DOB: [birth year]   Cancelled Treatment:    Reason Eval/Treat Not Completed: Medical issues which prohibited therapy;Patient not medically ready: Completed: OT screened pt. Pt being seen this morning by staff for correct connections for pleurX catheter.  Per Dr. Rowe Pavy, no physical therapy needs prior to d/c. OT will follow with recommendation for therapy to sign off.  Plan is currently d/c with hospice. Please re-consult OT if pt needs to be assessed for home vs residential facility hospice care.  SNF is not appropriate at this time.   Julien Girt 10/03/2020, 1:10 PM

## 2020-10-03 NOTE — Progress Notes (Signed)
PROGRESS NOTE    Mary Merritt  KZS:[id number] DOB: 17-Aug-1927 DOA: 09/30/2020 PCP: Katherina Mires, MD   Brief Narrative:  The patient is a 87 year old elderly chronically ill-appearing Caucasian female with a past medical history significant for but not limited to AAA, bradycardia, hypertension, hyperlipidemia, chronic hypoxic respiratory failure on 2 to 4 L of supplemental oxygen at home, history of small cell cancer status post chemotherapy and recurrent malignant pleural effusions requiring multiple thoracentesis who presented to the ED with shortness breath for 2 days and had to be placed on nonrebreather.  She was recently seen by her oncologist on 09/28/2020 for review of her CT scans which unfortunately showed marked disease progression.  She is at that time referred for hospice care and made DNR at that time.  She is also referred for palliative thoracentesis to be done on Monday however symptoms became so severe that she presented to ED having this done.  She also admitted to epigastric pain and mildly productive cough without hemoptysis.  In the ED she was placed on nonrebreather and labs were reviewed and she had a sodium 125, potassium 4.7.  She was afebrile and had no leukocytosis.  Her chest x-ray did show very large right sided pleural effusion with extensive right-sided atelectasis and moderate left basilar atelectasis and/or infiltrate with small pleural effusions and findings consistent with known mediastinal soft tissue mass.  TRH was asked to admit this patient for her respiratory distress in the setting of chronic hypoxic respiratory failure due to recurrent malignant pleural effusions.  Interventional radiology was consulted for a thoracentesis and she underwent thoracentesis however subsequently after thoracentesis repeat chest x-ray showed pneumothorax likely ex vacuo.  She is tolerating her home supplemental oxygen but was observed overnight and today she is more tachypneic and repeat  chest x-ray shows worsening pleural effusion.  Interventional radiology was reconsulted for evaluation of thorax catheter and palliative care evaluated as well.  The plan is for a pleural catheter placement today in IR.  PT OT and are recommending SNF if not going home with hospice  Patient had a Pleurx catheter placed yesterday and early this morning is very difficult to maintain her saturations so she ended up being placed on a nonrebreather.  Pulmonary was consulted for further evaluation and recommendations and they recommended suction and attachment to the drainage system to be the most beneficial in the setting of her pneumothorax.  They are going to repeat her x-ray later this afternoon this evening palliative care is following and she is supposed to go home with hospice and case management is assisting with getting the patient's Pleurx supplies.  Oncology evaluated and there continue to recommend comfort care measures to include treatment of the pleural effusion pneumothorax and narcotic analgesics.  She has a very poor prognosis and is not medically stable to be discharged currently.  Her right Pleurx is now attached to the Pleur-evac wall suction via adapter kit and they are recommending to drain effusion as needed.  IR feels that the lung is likely trapped and may not fully inflate even with the pleuraVAC use.  Assessment & Plan:   Principal Problem:   Respiratory distress Active Problems:   Essential hypertension   Mixed hyperlipidemia   Hyponatremia   Small cell lung cancer (HCC)   Pleural effusion   Pressure injury of skin  Acute Respiratory distress in the setting of chronic hypoxic respiratory failure secondary to recurrent malignant right pleural effusion and extensive right-sided and moderate left  atelectasis  Small Cell Lung Cancer -IR thoracentesis yielded 900 cc but repeat CXR after showed "Right-sided ex vacuo hydropneumothorax is favored after thoracentesis performed.   Consolidative changes of the right lung in this patient with known metastatic small cell carcinoma. Left apical mass with nodular changes, compatible with known malignancy." -SpO2: 90 % O2 Flow Rate (L/min): 11 L/min and her oxygen requirements are worsening -CXR yesterday AM showed "Large right hydropneumothorax with progressive, now near complete collapse of the right lung. Multiple pulmonary nodules within the left lung in keeping with pulmonary metastatic disease. Stable small left pleural effusion." -Will ask IR to see if the patient for Pleur-X Catheter drainage and plan is to be done today; Pleurx catheter was placed on 10/02/2020 -This morning she went into respiratory distress and had to be placed on nonrebreather and was saturating 60%; she was given IV Lasix 60 mg x 1 and given Xopenex and Atrovent breathing treatments. -Pulmonary was consulted for further evaluation recommendations and they are connecting the patient's Pleurx catheter to wall drainage system in the setting of her pneumothorax and repeating a x-ray later this afternoon -Pain management as needed and Palliative Consulted and she has Liquid Morphine and IV Morphine 2 mg IV q3hprn  Pain and SOB -Palliative care recommending discharging the patient home with morphine 5 mg every 4 hours medically stable -Hospice referral and palliative care consulted; she may be a [facility] candidate if she fails to improve go home -Medical oncology recommends continuing comfort measures  Sinus Tachycardia  Hypertension -See Below -HR was ranging from 58-113  Hypokalemia, improving  -Patient's K+ went from 4.7 -> 2.9 -> 3.2 -> 3.6 today was 3.9 -Checked Mag Level and was 1.5 -Continue to Monitor and Replete as Necessary  -Repeat CMP in the AM   Hypomagnesemia -Patient's Mag Level was 1.1 and improved to 1.5 -Replete with IV Mag Sulfate 4 Grams again -Continue to Monitor and Replete as Necessary -Repeat Mag Level in the AM    Hyponatremia -Sodium has gone from 125 -> 129 -> 128 -> 130 -Likely from volume overload and possibly from poor p.o. intake -Continue Hold off on fluids for now to prevent further volume overload and follow-up after thoracentesis -Continue to Monitor and Trend  Leukocytosis -Likely reactive in the setting of above but will need to rule out pneumonia so we have added IV ceftriaxone and IV azithromycin for postobstructive pneumonia next-continue with breathing treatments as above  -Repeat chest x-ray later this afternoon/evening -BC went from 7.8 is now 14.9 50 monitor and trend and repeat CBC in a.m.  Normocytic Anemia -Patient's Hgb/Hct went from 10.0/31.4 -> 9.6/31.1 -> 9.5/31.1 today it is 10.2/34.2 -In the setting of Malignancy -Continue to Monitor for S/Sx of Bleeding; No overt bleeding -Repeat CBC in the AM  Hypertension -C/w As needed IV Metoprolol Tartrate 5 mg IV q6hprn HBP -C/w Metoprolol 12.5 mg po BID  Depression and Anxiety -Continue home Escitalopram 10 mg po Daily  Hyperlipidemia -Not on home meds  GOC: DNR, poA -Palliative Care consulted and [place] Discussion happening -She will require a Pleur-X Catheter and Palliative Care has added Morphine Solution 5 mg po q3hprn Pain and SOB  DVT prophylaxis: Enoxaparin 40 mg sq q24h Code Status: DO NOT RESUSCITATE  Family Communication: No family present at bedside  Disposition Plan: Pending further clinical improvement and placement of Pleur-X  Status is: Inpatient  Remains inpatient appropriate because:Unsafe d/c plan, IV treatments appropriate due to intensity of illness or inability to  take PO and Inpatient level of care appropriate due to severity of illness   Dispo: The patient is from: Home              Anticipated d/c is to: Home vs Residential Hospice              Patient currently is not medically stable to d/c.   Difficult to place patient No  Consultants:   Palliative Care Medicine  Interventional  Radiology  Pulmonary  Medical Oncology    Procedures:  Sound guided right sided thoracentesis that removed 900 cc but due to patient discomfort she asked the physician to stop aspiration  Antimicrobials:  Anti-infectives (From admission, onward)   Start     Dose/Rate Route Frequency Ordered Stop   10/03/20 1000  azithromycin (ZITHROMAX) 500 mg in sodium chloride 0.9 % 250 mL IVPB        500 mg 250 mL/hr over 60 Minutes Intravenous Every 24 hours 10/03/20 0818     10/03/20 1000  cefTRIAXone (ROCEPHIN) 2 g in sodium chloride 0.9 % 100 mL IVPB        2 g 200 mL/hr over 30 Minutes Intravenous Every 24 hours 10/03/20 0818          Subjective: Seen and examined at bedside and she had a rapid response earlier this morning and she was saturating 60% on a nonrebreather and felt very dyspneic.  She is given IV Lasix 60 mg x 1 and a breathing treatment and she is ordered to have her Pleurx catheter drained however they connected to suction for wall suction and attempt to help her hydropneumothorax.  We will be repeating a chest x-ray this afternoon.  She still feels very dyspneic and short of breath and continues to have the morphine as needed.  Medical oncology continues to recommend comfort care and possible transition to residential hospice if she cannot go home with home hospice.  Objective: Vitals:   10/03/20 0522 10/03/20 0744 10/03/20 1248 10/03/20 1347  BP: (!) 127/58 (!) 174/99  115/65  Pulse: 78 (!) 113  97  Resp: 18   20  Temp: 97.6 F (36.4 C)   98.2 F (36.8 C)  TempSrc: Oral   Oral  SpO2: (!) 87% 92% 95% 90%  Weight:      Height:        Intake/Output Summary (Last 24 hours) at 10/03/2020 1412 Last data filed at 10/03/2020 0600 Gross per 24 hour  Intake 400 ml  Output 650 ml  Net -250 ml   Filed Weights   10/02/20 2327  Weight: 64.4 kg   Examination: Physical Exam:  Constitutional: WN/WD chronically ill-appearing Caucasian elderly female in moderate respiratory  distress this morning  Eyes: Lids and conjunctivae normal, sclerae anicteric  ENMT: External Ears, Nose appear normal. Grossly normal hearing.  Neck: Appears normal, supple, no cervical masses, normal ROM, no appreciable thyromegaly; no appreciable JVD Respiratory: Significantly diminished to auscultation bilaterally with coarse breath sounds on the right compared to left and appreciable crackles patient is tachypneic and using accessory muscles to breathe and wearing supplemental oxygen via nasal cannula now and she is being transitioned off of the nonrebreather Cardiovascular: Tachycardic rate but regular rhythm, no murmurs / rubs / gallops. S1 and S2 auscultated.  Has trace lower extremity edema Abdomen: Soft, non-tender, Distended 2/2 body habitus. Bowel sounds positive.  GU: Deferred. Musculoskeletal: No clubbing / cyanosis of digits/nails. No joint deformity upper and lower extremities.  Skin: No rashes, lesions,  ulcers on limited skin evaluation. No induration; Warm and dry.  Neurologic: CN 2-12 grossly intact with no focal deficits. Romberg sign and cerebellar reflexes not assessed.  Psychiatric: Normal judgment and insight. Alert and oriented x 3.  Anxious mood and appropriate affect.   Data Reviewed: I have personally reviewed following labs and imaging studies  CBC: Recent Labs  Lab 09/26/20 1417 09/30/20 0145 10/01/20 0327 10/02/20 0323 10/03/20 0616  WBC 9.3 8.8 9.3 7.8 14.9*  NEUTROABS 7.5 7.1  --  5.7 13.0*  HGB 9.9* 10.0* 9.6* 9.5* 10.2*  HCT 33.0* 31.4* 31.1* 31.1* 34.2*  MCV 90.9 89.7 91.5 93.1 94.5  PLT 382 376 301 346 161   Basic Metabolic Panel: Recent Labs  Lab 09/30/20 0145 10/01/20 0327 10/01/20 1431 10/02/20 0323 10/03/20 0616  NA 125* 125* 129* 128* 130*  K 4.7 2.9* 3.2* 3.6 3.9  CL 80* 80* 82* 82* 81*  CO2 33* 31 36* 34* 35*  GLUCOSE 114* 93 140* 108* 152*  BUN 9 7* 8 8 10   CREATININE 0.56 0.37* 0.65 0.43* 0.48  CALCIUM 8.6* 8.4* 8.6* 9.1 9.0   MG  --   --   --  1.1* 1.5*  PHOS  --   --   --  2.3* 3.9   GFR: Estimated Creatinine Clearance: 42.1 mL/min (by C-G formula based on SCr of 0.48 mg/dL). Liver Function Tests: Recent Labs  Lab 09/26/20 1417 09/30/20 0145 10/02/20 0323 10/03/20 0616  AST 18 58* 24 23  ALT 8 12 11 12   ALKPHOS 67 72 64 67  BILITOT 0.3 2.1* 0.6 0.6  PROT 6.4* 6.2* 5.9* 6.2*  ALBUMIN 3.4* 2.9* 2.8* 2.9*   No results for input(s): LIPASE, AMYLASE in the last 168 hours. No results for input(s): AMMONIA in the last 168 hours. Coagulation Profile: Recent Labs  Lab 09/30/20 0145  INR 1.0   Cardiac Enzymes: No results for input(s): CKTOTAL, CKMB, CKMBINDEX, TROPONINI in the last 168 hours. BNP (last 3 results) No results for input(s): PROBNP in the last 8760 hours. HbA1C: No results for input(s): HGBA1C in the last 72 hours. CBG: No results for input(s): GLUCAP in the last 168 hours. Lipid Profile: No results for input(s): CHOL, HDL, LDLCALC, TRIG, CHOLHDL, LDLDIRECT in the last 72 hours. Thyroid Function Tests: No results for input(s): TSH, T4TOTAL, FREET4, T3FREE, THYROIDAB in the last 72 hours. Anemia Panel: No results for input(s): VITAMINB12, FOLATE, FERRITIN, TIBC, IRON, RETICCTPCT in the last 72 hours. Sepsis Labs: Recent Labs  Lab 09/30/20 0142  LATICACIDVEN 1.6    Recent Results (from the past 240 hour(s))  SARS CORONAVIRUS 2 (TAT 6-24 HRS) Nasopharyngeal Nasopharyngeal Swab     Status: None   Collection Time: 09/29/20  1:31 PM   Specimen: Nasopharyngeal Swab  Result Value Ref Range Status   SARS Coronavirus 2 NEGATIVE NEGATIVE Final    Comment: (NOTE) SARS-CoV-2 target nucleic acids are NOT DETECTED.  The SARS-CoV-2 RNA is generally detectable in upper and lower respiratory specimens during the acute phase of infection. Negative results do not preclude SARS-CoV-2 infection, do not rule out co-infections with other pathogens, and should not be used as the sole basis for  treatment or other patient management decisions. Negative results must be combined with clinical observations, patient history, and epidemiological information. The expected result is Negative.  Fact Sheet for Patients: SugarRoll.be  Fact Sheet for Healthcare Providers: https://www.woods-mathews.com/  This test is not yet approved or cleared by the [place] FDA and  has  been authorized for detection and/or diagnosis of SARS-CoV-2 by FDA under an Emergency Use Authorization (EUA). This EUA will remain  in effect (meaning this test can be used) for the duration of the COVID-19 declaration under Se ction 564(b)(1) of the Act, 21 U.S.C. section 360bbb-3(b)(1), unless the authorization is terminated or revoked sooner.  Performed at [facility], [place] [address], [place], [place] [address]   Resp Panel by RT-PCR (Flu A&B, Covid) Nasopharyngeal Swab     Status: None   Collection Time: 09/30/20  2:26 AM   Specimen: Nasopharyngeal Swab; Nasopharyngeal(NP) swabs in vial transport medium  Result Value Ref Range Status   SARS Coronavirus 2 by RT PCR NEGATIVE NEGATIVE Final    Comment: (NOTE) SARS-CoV-2 target nucleic acids are NOT DETECTED.  The SARS-CoV-2 RNA is generally detectable in upper respiratory specimens during the acute phase of infection. The lowest concentration of SARS-CoV-2 viral copies this assay can detect is 138 copies/mL. A negative result does not preclude SARS-Cov-2 infection and should not be used as the sole basis for treatment or other patient management decisions. A negative result may occur with  improper specimen collection/handling, submission of specimen other than nasopharyngeal swab, presence of viral mutation(s) within the areas targeted by this assay, and inadequate number of viral copies(<138 copies/mL). A negative result must be combined with clinical observations, patient history, and  epidemiological information. The expected result is Negative.  Fact Sheet for Patients:  EntrepreneurPulse.com.au  Fact Sheet for Healthcare Providers:  IncredibleEmployment.be  This test is no t yet approved or cleared by the [place] FDA and  has been authorized for detection and/or diagnosis of SARS-CoV-2 by FDA under an Emergency Use Authorization (EUA). This EUA will remain  in effect (meaning this test can be used) for the duration of the COVID-19 declaration under Section 564(b)(1) of the Act, 21 U.S.C.section 360bbb-3(b)(1), unless the authorization is terminated  or revoked sooner.       Influenza A by PCR NEGATIVE NEGATIVE Final   Influenza B by PCR NEGATIVE NEGATIVE Final    Comment: (NOTE) The Xpert Xpress SARS-CoV-2/FLU/RSV plus assay is intended as an aid in the diagnosis of influenza from Nasopharyngeal swab specimens and should not be used as a sole basis for treatment. Nasal washings and aspirates are unacceptable for Xpert Xpress SARS-CoV-2/FLU/RSV testing.  Fact Sheet for Patients: EntrepreneurPulse.com.au  Fact Sheet for Healthcare Providers: IncredibleEmployment.be  This test is not yet approved or cleared by the [place] FDA and has been authorized for detection and/or diagnosis of SARS-CoV-2 by FDA under an Emergency Use Authorization (EUA). This EUA will remain in effect (meaning this test can be used) for the duration of the COVID-19 declaration under Section 564(b)(1) of the Act, 21 U.S.C. section 360bbb-3(b)(1), unless the authorization is terminated or revoked.  Performed at [facility], [place] [address] [place], [place] [address]      RN Pressure Injury Documentation: Pressure Injury 09/30/20 Sacrum Mid Stage 2 -  Partial thickness loss of dermis presenting as a shallow open injury with a red, pink wound bed without slough. 1cmx1cmx0cm pink  (Active)  09/30/20 1600  Location: Sacrum  Location Orientation: Mid  Staging: Stage 2 -  Partial thickness loss of dermis presenting as a shallow open injury with a red, pink wound bed without slough.  Wound Description (Comments): 1cmx1cmx0cm pink  Present on Admission: Yes     Estimated body mass index is 23.63 kg/m as calculated from the following:  Height as of this encounter: 5' 5"  (1.651 m).   Weight as of this encounter: 64.4 kg.  Malnutrition Type:   Malnutrition Characteristics:   Nutrition Interventions:   Radiology Studies: DG CHEST PORT 1 VIEW  Result Date: 10/03/2020 CLINICAL DATA:  Shortness of breath.  Pneumothorax EXAM: PORTABLE CHEST 1 VIEW COMPARISON:  October 02, 2020 FINDINGS: Chest tube remains on the right with moderate pneumothorax on the right which appears loculated superiorly, similar to 1 day prior. There is now effusion on the right with apparent hydropneumothorax on the right currently. There is consolidation in the left base with small left pleural effusion. There is a stable medial left upper lobe mass. Heart is upper normal in size with pulmonary vascularity grossly normal. There is aortic atherosclerosis. No bone lesions. There is carotid artery calcification bilaterally. IMPRESSION: PleurX catheter now present on the right with reaccumulation of pleural fluid in the right base. Pneumothorax remains on the right which may be partially loculated. No tension component. Consolidation left lower lobe with small left pleural effusion, stable. Left medial upper lobe/apical mass, grossly stable. Stable cardiac silhouette. Carotid artery calcification bilaterally. Aortic Atherosclerosis (ICD10-I70.0). Electronically Signed   By: Lowella Grip III M.D.   On: 10/03/2020 07:56   DG Chest Port 1 View  Result Date: 10/02/2020 CLINICAL DATA:  Right hydropneumothorax follow-up status post PleurX catheter placement. EXAM: PORTABLE CHEST 1 VIEW COMPARISON:  Chest x-ray  from same day at 0419 hours. FINDINGS: Interval placement of a right-sided PleurX catheter with essentially resolved right pleural effusion. Decreased now moderate right pneumothorax with partial re-expansion of the right lung. Patchy consolidation in the right lung related to underlying tumor. Unchanged left apical paramediastinal mass and multiple pulmonary nodules in the left lung. Unchanged small left pleural effusion. Stable heart size. No acute osseous abnormality. IMPRESSION: 1. Interval placement of a right-sided PleurX catheter with essentially resolved right pleural effusion, decreased now moderate right pneumothorax, and partial re-expansion of the right lung. 2. Unchanged pulmonary metastatic disease. Electronically Signed   By: Titus Dubin M.D.   On: 10/02/2020 16:27   DG CHEST PORT 1 VIEW  Result Date: 10/02/2020 CLINICAL DATA:  Dyspnea EXAM: PORTABLE CHEST 1 VIEW COMPARISON:  10/01/2020 FINDINGS: Large right hydropneumothorax is again identified with progressive, now near complete, collapse of the right lung. Small left pleural effusion is unchanged. Multiple peripheral pulmonary nodules are again identified within the left lung. Left apical paramediastinal mass again identified. Cardiac size within normal limits. No acute bone abnormality. IMPRESSION: Large right hydropneumothorax with progressive, now near complete collapse of the right lung. Multiple pulmonary nodules within the left lung in keeping with pulmonary metastatic disease. Stable small left pleural effusion. Electronically Signed   By: Fidela Salisbury MD   On: 10/02/2020 05:18   IR PERC PLEURAL DRAIN W/INDWELL CATH W/IMG GUIDE  Result Date: 10/02/2020 INDICATION: 87 year old with history of small cell lung cancer and recurrent malignant pleural effusion. Patient presented with respiratory distress and a large right pleural effusion. Thoracentesis was performed and the follow-up chest radiograph demonstrated a ex vacuo  pneumothorax compatible with a trapped right lung. Subsequent chest radiograph demonstrated increased collapse of the right lung. Patient is receiving palliative care and primary service requested placement of a tunneled pleural catheter. EXAM: PLACEMENT OF RIGHT CHEST TUNNELED PLEURAL CATHETER WITH ULTRASOUND AND FLUOROSCOPIC GUIDANCE MEDICATIONS: Moderate sedation ANESTHESIA/SEDATION: Fentanyl 50 mcg IV; Versed 0.5 mg IV Moderate Sedation Time:  25 minutes The patient was continuously monitored during the procedure by  the interventional radiology nurse under my direct supervision. FLUOROSCOPY TIME:  42 seconds, 14 mGy COMPLICATIONS: None immediate. PROCEDURE: Informed consent was obtained for a tunneled pleural catheter. A timeout was performed prior to the initiation of the procedure. Patient was placed on the interventional table. Ultrasound demonstrated a loculated right pleural effusion along with right pleural air. The right side of the chest was prepped and draped in sterile fashion. Maximal barrier sterile technique was utilized including caps, mask, sterile gowns, sterile gloves, sterile drape, hand hygiene and skin antiseptic. Skin was anesthetized using 1% lidocaine. Small incision was made along the right lateral lower chest. Using ultrasound guidance, a Yueh catheter was directed into the loculated pleural effusion and amber colored fluid was aspirated. A second incision was made more medially in the right lower chest. PleurX catheter was tunneled between the 2 incisions. The cuff was placed underneath the skin. A superstiff Amplatz wire was advanced into the right pleural space using fluoroscopic guidance. Catheter was cut to an appropriate length. The peel-away sheath was placed over the wire and the PleurX catheter was advanced into the pleural space. 550 mL of amber/blood tinged fluid was removed from the right pleural space along with a large amount of air. The pleural entrance skin site was  closed using absorbable suture and Dermabond. Catheter exit site was secured to skin with suture. Dressing was placed. FINDINGS: Ultrasound demonstrated a loculated right pleural effusion. Large amount of pleural air on fluoroscopy. PleurX catheter was successfully placed in the right pleural space and 550 mL of fluid was removed. IMPRESSION: Successful placement of a right chest tunneled pleural catheter using ultrasound and fluoroscopic guidance. Portable chest radiograph was performed following catheter placement and demonstrated improved aeration in the right lung but a persistent large right pneumothorax. This is compatible with an ex vacuo pneumothorax and trapped right lung secondary to the patient's malignancy. Suspect this is the baseline aeration for the right lung. Electronically Signed   By: Markus Daft M.D.   On: 10/02/2020 17:19   Scheduled Meds: . aspirin EC  81 mg Oral Daily  . enoxaparin (LOVENOX) injection  40 mg Subcutaneous Q24H  . escitalopram  10 mg Oral Daily  . furosemide      . guaiFENesin  1,200 mg Oral BID  . ipratropium  0.5 mg Nebulization Q6H  . levalbuterol  0.63 mg Nebulization Q6H  . metoprolol tartrate  12.5 mg Oral BID  . sodium chloride flush  3 mL Intravenous Q12H   Continuous Infusions: . azithromycin 500 mg (10/03/20 1100)  . cefTRIAXone (ROCEPHIN)  IV 2 g (10/03/20 1021)     LOS: 2 days   Kerney Elbe, DO [facility] PAGER is on [place]  If 7PM-7AM, please contact night-coverage [email or web address]

## 2020-10-03 NOTE — TOC Progression Note (Addendum)
Transition of Care [facility]) - Progression Note    Patient Details  Name: Mary Merritt MRN: [id number] Date of Birth: [birth year]  Transition of Care [facility]) CM/SW Contact  Joaquin Courts, RN Phone Number: 10/03/2020, 10:05 AM  Clinical Narrative:    Patient is active with hospice at home through [place].  CM spoke with [facility] rep Krislyn regarding pleurex.  ACC to manage pleurex in the home and arrange all supplies.  Plan to send one box of drain kits with patient at discharge until home supply is delivered, unit secretary to order.    Expected Discharge Plan: Home w Hospice Care Barriers to Discharge: No Barriers Identified  Expected Discharge Plan and Services Expected Discharge Plan: [place]   Discharge Planning Services: CM Consult   Living arrangements for the past 2 months: Single Family Home                                       Social Determinants of Health (SDOH) Interventions    Readmission Risk Interventions No flowsheet data found.

## 2020-10-03 NOTE — Progress Notes (Signed)
Daily Progress Note   Patient Name: Mary Merritt       Date: 10/03/2020 DOB: 20-Jan-1928  Age: 87 y.o. MRN#: [id number] Attending Physician: Kerney Elbe, DO Primary Care Physician: Katherina Mires, MD Admit Date: 09/30/2020  Reason for Consultation/Follow-up: Non pain symptom management  Subjective: Discussed with patient, discussed with [facility] attending, discussed with bedside nursing staff.  Events this morning noted.  Patient noted to have become acutely dyspneic and hypoxic.  She required nonrebreather mask.  She had oxygen saturations in the 60s and 70s.  Currently back on nasal cannula.  Length of Stay: 2  Current Medications: Scheduled Meds:  . aspirin EC  81 mg Oral Daily  . enoxaparin (LOVENOX) injection  40 mg Subcutaneous Q24H  . escitalopram  10 mg Oral Daily  . furosemide      . guaiFENesin  1,200 mg Oral BID  . ipratropium  0.5 mg Nebulization Q6H  . levalbuterol  0.63 mg Nebulization Q6H  . metoprolol tartrate  12.5 mg Oral BID  . sodium chloride flush  3 mL Intravenous Q12H    Continuous Infusions: . azithromycin 500 mg (10/03/20 1100)  . cefTRIAXone (ROCEPHIN)  IV 2 g (10/03/20 1021)    PRN Meds: metoprolol tartrate, morphine, morphine injection, polyethylene glycol  Physical Exam         General: resting in bed, awakens some, noted to be in mild to moderate respiratory distress.  HEENT: No bruits, no goiter, no JVD Heart: Regular rate and rhythm. No murmur appreciated. Lungs:  coarse breath sounds anteriorly Abdomen: Soft, nontender, nondistended, positive bowel sounds.  Ext: No significant edema Skin: Warm and dry Neuro: Grossly intact, nonfocal.  Vital Signs: BP (!) 174/99 (BP Location: Left Arm)   Pulse (!) 113   Temp 97.6 F (36.4 C) (Oral)    Resp 18   Ht 5\' 5"  (1.651 m)   Wt 64.4 kg   SpO2 92%   BMI 23.63 kg/m  SpO2: SpO2: 92 % O2 Device: O2 Device: Nasal Cannula O2 Flow Rate: O2 Flow Rate (L/min): 10 L/min  Intake/output summary:   Intake/Output Summary (Last 24 hours) at 10/03/2020 1107 Last data filed at 10/03/2020 0600 Gross per 24 hour  Intake 400 ml  Output 650 ml  Net -250 ml   LBM: Last BM Date: 10/01/19  Baseline Weight: Weight: 64.4 kg Most recent weight: Weight: 64.4 kg       Palliative Assessment/Data:    Flowsheet Rows   Flowsheet Row Most Recent Value  Intake Tab   Referral Department Hospitalist  Unit at Time of Referral Med/Surg Unit  Palliative Care Primary Diagnosis Cancer  Date Notified 09/30/20  Palliative Care Type New Palliative care  Reason for referral Non-pain Symptom  Date of Admission 09/30/20  Date first seen by Palliative Care 10/01/20  # of days Palliative referral response time 1 Day(s)  # of days IP prior to Palliative referral 0  Clinical Assessment   Palliative Performance Scale Score 40%  Psychosocial & Spiritual Assessment   Palliative Care Outcomes       Patient Active Problem List   Diagnosis Date Noted  . Pressure injury of skin 10/01/2020  . Respiratory distress 09/30/2020  . Pleural effusion 07/07/2020  . Small cell lung cancer ([place]) 06/05/2020  . Goals of care, counseling/discussion 06/05/2020  . Abnormal chest x-ray   . Pneumonia 05/14/2020  . Pain in right foot 11/17/2019  . Hyponatremia 11/22/2017  . Nondisplaced fracture of left tibial spine, subsequent encounter for closed fracture with routine healing 08/05/2017  . Chronic pain of left knee 07/21/2017  . Presence of left artificial hip joint 05/30/2016  . Hip joint replacement status 03/27/2016  . S/P hip replacement 07/27/2015  . Osteoarthritis of right hip 08/01/2014  . Essential hypertension 05/31/2014  . Mixed hyperlipidemia 05/31/2014  . S/P AAA repair 05/31/2014  . Bradycardia  05/31/2014  . Murmur 05/31/2014    Palliative Care Assessment & Plan   Patient Profile: 87 year old female on hospice with [facility] due to small cell lung cancer who was admitted secondary to shortness of breath from recurrent pleural effusion.  Plan for Pleurx catheter  Recommendations/Plan:  DNR/DNI  Patient underwent placement of tunneled pleural catheter on the right side on 10-02-20, 550 mL of blood-tinged pleural fluid was removed.   Morphine p.o. or IV as needed for shortness of breath.  Would recommend discharge home with morphine 5 mg p.o. every 4 hours as needed for shortness of breath.  Discussed with bedside nursing staff about using morphine for symptom management.  Plan for discharge home with hospice when she is medically ready.   Code Status:    Code Status Orders  (From admission, onward)         Start     Ordered   09/30/20 0808  Do not attempt resuscitation (DNR)  Continuous       Question Answer Comment  In the event of cardiac or respiratory ARREST Do not call a "code blue"   In the event of cardiac or respiratory ARREST Do not perform Intubation, CPR, defibrillation or ACLS   In the event of cardiac or respiratory ARREST Use medication by any route, position, wound care, and other measures to relive pain and suffering. May use oxygen, suction and manual treatment of airway obstruction as needed for comfort.      09/30/20 0807        Code Status History    Date Active Date Inactive Code Status Order ID Comments User Context   07/14/2020 1141 07/14/2020 2232 DNR [id number]  Mercy Riding, MD Inpatient   07/07/2020 2209 07/14/2020 1141 Partial Code [id number]  Elwyn Reach, MD ED   05/14/2020 1839 05/16/2020 2356 Partial Code [id number]  Gifford Shave, MD ED   04/07/2020 1321 04/07/2020 2233 Full Code [id number]  Scot Dock,  Judeth Cornfield, MD Inpatient   11/22/2017 1815 11/24/2017 1558 Full Code [id number]  Mariel Aloe, MD Inpatient   11/22/2017  0825 11/22/2017 1815 DNR [id number]  Mariel Aloe, MD Inpatient   03/27/2016 1837 03/29/2016 1611 Full Code [id number]  Leandrew Koyanagi, MD Inpatient   08/01/2014 1703 08/03/2014 1647 Full Code [id number]  Marianna Payment, MD Inpatient   Advance Care Planning Activity       Prognosis:   < 6 months  Discharge Planning:  Home with Hospice  Care plan was discussed with patient, [facility] attending and bedside nursing staff.  Thank you for allowing the Palliative Medicine Team to assist in the care of this patient.   Total Time 25 Prolonged Time Billed No      Greater than 50%  of this time was spent counseling and coordinating care related to the above assessment and plan.  Loistine Chance, MD  Please contact Palliative Medicine Team phone at [phone] for questions and concerns.

## 2020-10-03 NOTE — Progress Notes (Signed)
Referring Physician(s): [place]  Supervising Physician: Jacqulynn Cadet  Patient Status:  [facility] - In-pt  Chief Complaint:  Lung cancer, dyspnea, recurrent malignant right pleural effusion  Subjective: Pt s/p rt pleurx cath placement with removal of 550 cc pleural fluid yesterday; has known rt hydroptx, likely trapped lung; pt drowsy but arousable; denies CP; developed some worsening dyspnea/increased O2 requirements this am; pleurx now placed to pleuravac system/wall suction   Allergies: Percocet [oxycodone-acetaminophen]  Medications: Prior to Admission medications   Medication Sig Start Date End Date Taking? Authorizing Provider  aspirin EC 81 MG tablet Take 81 mg by mouth daily.   Yes [provider]  diphenhydrAMINE (BENADRYL) 25 mg capsule Take 25 mg by mouth at bedtime as needed for sleep.   Yes [provider]  escitalopram (LEXAPRO) 10 MG tablet Take 10 mg by mouth daily.   Yes [provider]  feeding supplement (ENSURE ENLIVE / ENSURE PLUS) LIQD Take 237 mLs by mouth 3 (three) times daily between meals. 07/14/20  Yes Mercy Riding, MD  HYDROcodone-acetaminophen (NORCO) 5-325 MG tablet Take 1 tablet by mouth every 6 (six) hours as needed for moderate pain. 09/27/20  Yes Owens Shark, NP  ibuprofen (ADVIL) 200 MG tablet Take 400 mg by mouth every 4 (four) hours as needed for mild pain.   Yes [provider]  metoprolol tartrate (LOPRESSOR) 25 MG tablet Take 0.5 tablets (12.5 mg total) by mouth 2 (two) times daily. 12/27/19  Yes Hilty, Nadean Corwin, MD  polyethylene glycol (MIRALAX / GLYCOLAX) 17 g packet Take 17 g by mouth 2 (two) times daily as needed. Patient taking differently: Take 17 g by mouth 2 (two) times daily as needed for mild constipation. 07/14/20  Yes Mercy Riding, MD  chlorpheniramine-HYDROcodone (TUSSIONEX) 10-8 MG/5ML SUER Take 5 mLs by mouth every 12 (twelve) hours as needed for cough. 09/28/20   Owens Shark, NP      Vital Signs: BP (!) 174/99 (BP Location: Left Arm)   Pulse (!) 113   Temp 97.6 F (36.4 C) (Oral)   Resp 18   Ht 5' 5"  (1.651 m)   Wt 141 lb 15.6 oz (64.4 kg)   SpO2 92%   BMI 23.63 kg/m   Physical Exam awake, sl drowsy; answers simple questions ok;rt pleurx cath intact; bandage clean and dry; now attached to pleuravac/wall suction  Imaging: DG Chest 2 View  Result Date: 09/30/2020 CLINICAL DATA:  Shortness of breath x2 days. EXAM: CHEST - 2 VIEW COMPARISON:  August 14, 2020 FINDINGS: There is marked severity opacification of the right hemithorax with a mild amount of aerated lung seen along the medial aspect of the mid and upper right lung. This is increased in severity when compared to the prior study. Moderate severity left basilar atelectasis and/or infiltrate is seen. There is a small left pleural effusion. No pneumothorax is identified. Mild to moderate severity enlargement of the cardiac silhouette is seen. An ill-defined lobulated opacity is seen along the superior mediastinum on the left. A chronic eighth right rib fracture is noted. IMPRESSION: 1. Large right pleural effusion with associated extensive right upper lobe, right middle lobe and right lower lobe atelectasis and/or infiltrate. 2. Moderate severity left basilar atelectasis and/or infiltrate with a small left pleural effusion. 3. Findings consistent with the patient's known prevascular/anterior mediastinal soft tissue mass. Electronically Signed   By: Virgina Norfolk M.D.   On: 09/30/2020 02:38   DG CHEST PORT 1 VIEW  Result  Date: 10/03/2020 CLINICAL DATA:  Shortness of breath.  Pneumothorax EXAM: PORTABLE CHEST 1 VIEW COMPARISON:  October 02, 2020 FINDINGS: Chest tube remains on the right with moderate pneumothorax on the right which appears loculated superiorly, similar to 1 day prior. There is now effusion on the right with apparent hydropneumothorax on the right currently. There is consolidation in the left base  with small left pleural effusion. There is a stable medial left upper lobe mass. Heart is upper normal in size with pulmonary vascularity grossly normal. There is aortic atherosclerosis. No bone lesions. There is carotid artery calcification bilaterally. IMPRESSION: PleurX catheter now present on the right with reaccumulation of pleural fluid in the right base. Pneumothorax remains on the right which may be partially loculated. No tension component. Consolidation left lower lobe with small left pleural effusion, stable. Left medial upper lobe/apical mass, grossly stable. Stable cardiac silhouette. Carotid artery calcification bilaterally. Aortic Atherosclerosis (ICD10-I70.0). Electronically Signed   By: Lowella Grip III M.D.   On: 10/03/2020 07:56   DG Chest Port 1 View  Result Date: 10/02/2020 CLINICAL DATA:  Right hydropneumothorax follow-up status post PleurX catheter placement. EXAM: PORTABLE CHEST 1 VIEW COMPARISON:  Chest x-ray from same day at 0419 hours. FINDINGS: Interval placement of a right-sided PleurX catheter with essentially resolved right pleural effusion. Decreased now moderate right pneumothorax with partial re-expansion of the right lung. Patchy consolidation in the right lung related to underlying tumor. Unchanged left apical paramediastinal mass and multiple pulmonary nodules in the left lung. Unchanged small left pleural effusion. Stable heart size. No acute osseous abnormality. IMPRESSION: 1. Interval placement of a right-sided PleurX catheter with essentially resolved right pleural effusion, decreased now moderate right pneumothorax, and partial re-expansion of the right lung. 2. Unchanged pulmonary metastatic disease. Electronically Signed   By: Titus Dubin M.D.   On: 10/02/2020 16:27   DG CHEST PORT 1 VIEW  Result Date: 10/02/2020 CLINICAL DATA:  Dyspnea EXAM: PORTABLE CHEST 1 VIEW COMPARISON:  10/01/2020 FINDINGS: Large right hydropneumothorax is again identified with  progressive, now near complete, collapse of the right lung. Small left pleural effusion is unchanged. Multiple peripheral pulmonary nodules are again identified within the left lung. Left apical paramediastinal mass again identified. Cardiac size within normal limits. No acute bone abnormality. IMPRESSION: Large right hydropneumothorax with progressive, now near complete collapse of the right lung. Multiple pulmonary nodules within the left lung in keeping with pulmonary metastatic disease. Stable small left pleural effusion. Electronically Signed   By: Fidela Salisbury MD   On: 10/02/2020 05:18   DG CHEST PORT 1 VIEW  Result Date: 10/01/2020 CLINICAL DATA:  Pneumothorax.  Shortness of breath. EXAM: PORTABLE CHEST 1 VIEW COMPARISON:  09/30/2020 FINDINGS: Slight interval increase in the moderate right hydropneumothorax. Persistent retrocardiac left base collapse/consolidation with probable small left pleural effusion. Left paramediastinal opacity in the upper left chest is compatible with the patient's known mass lesion. Bones are diffusely demineralized. IMPRESSION: 1. Slight interval increase in the moderate right hydropneumothorax. 2. Otherwise stable exam. Electronically Signed   By: Misty Stanley M.D.   On: 10/01/2020 11:00   DG Chest Port 1 View  Result Date: 09/30/2020 CLINICAL DATA:  87 year old female with recurrent right-sided pleural effusion secondary to known advanced small cell carcinoma, referred for thoracentesis EXAM: PORTABLE CHEST 1 VIEW COMPARISON:  CT 09/27/2020, chest x-ray 08/14/2020, chest x-ray 09/30/2020 FINDINGS: Cardiomediastinal silhouette likely unchanged, borderline enlarged heart border. Right-sided hydropneumothorax status post right-sided thoracentesis with significant reduction in the volume right-sided  pleural fluid. Consolidative changes in the right lung, compatible with known pleural/parenchymal metastatic disease. Opacity at the left lung apex, suprahilar region,  compatible with known mediastinal/pleural involvement on prior CT. Nodular changes of the left lung compatible with known malignancy and better seen on prior CT. IMPRESSION: Right-sided ex vacuo hydropneumothorax is favored after thoracentesis performed. Consolidative changes of the right lung in this patient with known metastatic small cell carcinoma. Left apical mass with nodular changes, compatible with known malignancy. Electronically Signed   By: Corrie Mckusick D.O.   On: 09/30/2020 12:01   IR PERC PLEURAL DRAIN W/INDWELL CATH W/IMG GUIDE  Result Date: 10/02/2020 INDICATION: 87 year old with history of small cell lung cancer and recurrent malignant pleural effusion. Patient presented with respiratory distress and a large right pleural effusion. Thoracentesis was performed and the follow-up chest radiograph demonstrated a ex vacuo pneumothorax compatible with a trapped right lung. Subsequent chest radiograph demonstrated increased collapse of the right lung. Patient is receiving palliative care and primary service requested placement of a tunneled pleural catheter. EXAM: PLACEMENT OF RIGHT CHEST TUNNELED PLEURAL CATHETER WITH ULTRASOUND AND FLUOROSCOPIC GUIDANCE MEDICATIONS: Moderate sedation ANESTHESIA/SEDATION: Fentanyl 50 mcg IV; Versed 0.5 mg IV Moderate Sedation Time:  25 minutes The patient was continuously monitored during the procedure by the interventional radiology nurse under my direct supervision. FLUOROSCOPY TIME:  42 seconds, 14 mGy COMPLICATIONS: None immediate. PROCEDURE: Informed consent was obtained for a tunneled pleural catheter. A timeout was performed prior to the initiation of the procedure. Patient was placed on the interventional table. Ultrasound demonstrated a loculated right pleural effusion along with right pleural air. The right side of the chest was prepped and draped in sterile fashion. Maximal barrier sterile technique was utilized including caps, mask, sterile gowns, sterile  gloves, sterile drape, hand hygiene and skin antiseptic. Skin was anesthetized using 1% lidocaine. Small incision was made along the right lateral lower chest. Using ultrasound guidance, a Yueh catheter was directed into the loculated pleural effusion and amber colored fluid was aspirated. A second incision was made more medially in the right lower chest. PleurX catheter was tunneled between the 2 incisions. The cuff was placed underneath the skin. A superstiff Amplatz wire was advanced into the right pleural space using fluoroscopic guidance. Catheter was cut to an appropriate length. The peel-away sheath was placed over the wire and the PleurX catheter was advanced into the pleural space. 550 mL of amber/blood tinged fluid was removed from the right pleural space along with a large amount of air. The pleural entrance skin site was closed using absorbable suture and Dermabond. Catheter exit site was secured to skin with suture. Dressing was placed. FINDINGS: Ultrasound demonstrated a loculated right pleural effusion. Large amount of pleural air on fluoroscopy. PleurX catheter was successfully placed in the right pleural space and 550 mL of fluid was removed. IMPRESSION: Successful placement of a right chest tunneled pleural catheter using ultrasound and fluoroscopic guidance. Portable chest radiograph was performed following catheter placement and demonstrated improved aeration in the right lung but a persistent large right pneumothorax. This is compatible with an ex vacuo pneumothorax and trapped right lung secondary to the patient's malignancy. Suspect this is the baseline aeration for the right lung. Electronically Signed   By: Markus Daft M.D.   On: 10/02/2020 17:19   US THORACENTESIS ASP PLEURAL SPACE W/IMG GUIDE  Result Date: 09/30/2020 INDICATION: 87 year old female with a history of small cell carcinoma and recurrent right-sided pleural effusion EXAM: ULTRASOUND GUIDED RIGHT THORACENTESIS MEDICATIONS:  None. COMPLICATIONS: None PROCEDURE: An ultrasound guided thoracentesis was thoroughly discussed with the patient and questions answered. The benefits, risks, alternatives and complications were also discussed. The patient understands and wishes to proceed with the procedure. Written consent was obtained. Ultrasound was performed to localize and mark an adequate pocket of fluid in the right chest. The area was then prepped and draped in the normal sterile fashion. 1% Lidocaine was used for local anesthesia. Under ultrasound guidance a 8 Fr Safe-T-Centesis catheter was introduced. Thoracentesis was performed. The catheter was removed when the patient complained of some chest discomfort and a dressing applied. FINDINGS: A total of approximately 900 cc of thin amber fluid was removed. IMPRESSION: Status post ultrasound-guided right-sided thoracentesis. Signed, Dulcy Fanny. Dellia Nims, RPVI [facility] Electronically Signed   By: Corrie Mckusick D.O.   On: 09/30/2020 13:11    Labs:  CBC: Recent Labs    09/30/20 0145 10/01/20 0327 10/02/20 0323 10/03/20 0616  WBC 8.8 9.3 7.8 14.9*  HGB 10.0* 9.6* 9.5* 10.2*  HCT 31.4* 31.1* 31.1* 34.2*  PLT 376 301 346 357    COAGS: Recent Labs    07/07/20 1837 09/30/20 0145  INR 1.0 1.0    BMP: Recent Labs    10/01/20 0327 10/01/20 1431 10/02/20 0323 10/03/20 0616  NA 125* 129* 128* 130*  K 2.9* 3.2* 3.6 3.9  CL 80* 82* 82* 81*  CO2 31 36* 34* 35*  GLUCOSE 93 140* 108* 152*  BUN 7* 8 8 10   CALCIUM 8.4* 8.6* 9.1 9.0  CREATININE 0.37* 0.65 0.43* 0.48  GFRNONAA >60 >60 >60 >60    LIVER FUNCTION TESTS: Recent Labs    09/26/20 1417 09/30/20 0145 10/02/20 0323 10/03/20 0616  BILITOT 0.3 2.1* 0.6 0.6  AST 18 58* 24 23  ALT 8 12 11 12   ALKPHOS 67 72 64 67  PROT 6.4* 6.2* 5.9* 6.2*  ALBUMIN 3.4* 2.9* 2.8* 2.9*    Assessment and Plan: Pt with hx met small cell lung cancer with  recurrent malignant rt pleural effusion, s/p rt pleurx cath placement 4/11; now transitioned to hospice/DNR; afebrile; O2 sats 92% 10 l N/C; WBC 14.9, hgb 10.2, creat nl;  CXR this am:  PleurX catheter now present on the right with reaccumulation of pleural fluid in the right base. Pneumothorax remains on the right which may be partially loculated. No tension component.  Consolidation left lower lobe with small left pleural effusion, stable. Left medial upper lobe/apical mass, grossly stable. Stable cardiac silhouette.  Carotid artery calcification bilaterally. Aortic Atherosclerosis  Rt pleurx now attached to pleuravac/wall suction via adapter kit; drain effusion prn; lung likely trapped and may not fully inflate even with pleuravac use; pulm also following; for f/u CXR this pm   Electronically Signed: D. Rowe Robert, PA-C 10/03/2020, 11:20 AM   I spent a total of 20 minutes at the the patient's bedside AND on the patient's hospital floor or unit, greater than 50% of which was counseling/coordinating care for right chest pleurx    Patient ID: Mary Merritt, female   DOB: [birth year], 87 y.o.   MRN: [id number]

## 2020-10-03 NOTE — Progress Notes (Signed)
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 Family activated EMS after pt began to complain of severe shortness of breath with severe back pain. Family contacted hospice before activating 911 but declined on call nurse visit in favor of transporting to emergency department. The patient was admitted to [facility] on 09/30/20 with a diagnosis of respiratory distress. Per Dr. Konrad Dolores with [place] this is a related hospital admission.  Visited patient bedside and spoke with son. Received report from bedside RN. Patient is alert an oriented to person and place, dyspneic and receiving a nebulizer treatment at time of visit. Following treatment, she appears slightly more comfortable but unable to speak. She has pleurx drain attached to the Pleur-evac wall suction. IR feels that the lung is likely trapped and may not fully inflate even with the pleuraVAC use. RN reports that earlier patient was unable to maintain oxygent sats and was placed on nonrebreather. Since that time she has been put back on 11Lnc.   VS: 98.2, 115/65, 97, 20, 90% on 11Lnc I/O: 400(IV)/650 Labs:  10/03/2020 06:16 Chloride: 81 (L) CO2: 35 (H) Glucose: 152 (H) Magnesium: 1.5 (L) Albumin: 2.9 (L) Total Protein: 6.2 (L) WBC: 14.9 (H) RBC: 3.62 (L) Hemoglobin: 10.2 (L) HCT: 34.2 (L) MCHC: 29.8 (L) RDW: 18.1 (H) NEUT#: 13.0 (H) Lymphocyte #: 0.5 (L) Monocyte #: 1.3 (H) Abs Immature Granulocytes: 0.08 (H)  Epic Imaging CXR FINDINGS: Stable right chest PleurX catheter position. Unchanged moderate right pneumothorax and residual right pleural effusion. Stable bilateral airspace disease/consolidation pattern. Unchanged left effusion. Heart is enlarged but the contours are obscured. IMPRESSION: Stable moderate right hydropneumothorax related to incomplete re-expansion of the right  lung related to underlying malignancy. Stable right PleurX chest tube position Unchanged bilateral consolidative airspace process and left pleural effusion as well.  IV/PRNs:  Zithromax 500mg  IVPB QD, Rocephin 2g IVPB QD, Magnesium sulfate 4g IVPB completed today Morphine 2mg  IV PRN @ 0442  Problem List: - respiratory distress secondary to pleural effusion - thoracentesis on Saturday, O2 increased for comfort, morphine for dysnpea and pain. Plan for pleurx drain placed attached to the Pleur-evac wall suction Monday for relief of dysnpea. attached to the Pleur-evac wall suction - small cell lung cancer - was admitted onto hospice services in the home on 4/8 for this, patient then transported later that evening to the ED for dyspnea and severe pain - hyponatremia - today 130 today, no IVFs, may be chronic in nature  DC planning: Spoke with son at bedside. Son states originally family wanted to take her home but due to decline over past few days they would prefer she transfer to [facility] for better symptom management and end of life care.  GOC: Clear. Comfort care only  Family: updated at the bedside IDT: updated  Farrel Gordon, RN, [place] (listed on [facility] under [facility])    [phone]

## 2020-10-03 NOTE — Progress Notes (Addendum)
HEMATOLOGY-ONCOLOGY PROGRESS NOTE  SUBJECTIVE: Mary Merritt is followed by [place] for small cell lung cancer.  She was seen in our office in outpatient on 4/7.  At that visit, a restaging CT scan was reviewed with the patient and her family which showed evidence of disease progression following 3 cycles of systemic therapy.  The patient was given the option of salvage systemic therapy versus referral to hospice.  She opted to enroll in hospice.  The patient developed worsening shortness of breath and presented to the emergency room.  Chest x-ray showed a large right pleural effusion.  She underwent an ultrasound-guided thoracentesis on 4/9 and 900 cc of fluid removed. She had a Pleurx catheter placed by IR on 4/11.  The patient is more hypoxic this morning.  She is visibly short of breath.  Chest x-ray performed after Pleurx catheter placement showed a moderate right pneumothorax.  Was seen by PCCM prior to my visit who recommends attachment to drainage system.  Oncology History  Small cell lung cancer ([place])  06/05/2020 Initial Diagnosis   Small cell lung cancer ([place])   06/14/2020 - 08/31/2020 Chemotherapy          PHYSICAL EXAMINATION:  Vitals:   10/03/20 0522 10/03/20 0744  BP: (!) 127/58 (!) 174/99  Pulse: 78 (!) 113  Resp: 18   Temp: 97.6 F (36.4 C)   SpO2: (!) 87% 92%   Filed Weights   10/02/20 2327  Weight: 64.4 kg    Intake/Output from previous day: 04/11 0701 - 04/12 0700 In: 400 [P.O.:400] Out: 650 [Urine:650]  GENERAL: Medically ill-appearing female, appears visibly short of breath LUNGS: Coarse breath sounds bilaterally, tachypneic, on O2 at 10 L via nasal cannula HEART: regular rate & rhythm and no murmurs and 1+ lower extremity edema ABDOMEN:abdomen soft, non-tender and normal bowel sounds  LABORATORY DATA:  I have reviewed the data as listed CMP Latest Ref Rng & Units 10/03/2020 10/02/2020 10/01/2020  Glucose 70 - 99 mg/dL 152(H) 108(H) 140(H)  BUN 8 - 23 mg/dL 10 8  8   Creatinine 0.44 - 1.00 mg/dL 0.48 0.43(L) 0.65  Sodium 135 - 145 mmol/L 130(L) 128(L) 129(L)  Potassium 3.5 - 5.1 mmol/L 3.9 3.6 3.2(L)  Chloride 98 - 111 mmol/L 81(L) 82(L) 82(L)  CO2 22 - 32 mmol/L 35(H) 34(H) 36(H)  Calcium 8.9 - 10.3 mg/dL 9.0 9.1 8.6(L)  Total Protein 6.5 - 8.1 g/dL 6.2(L) 5.9(L) -  Total Bilirubin 0.3 - 1.2 mg/dL 0.6 0.6 -  Alkaline Phos 38 - 126 U/L 67 64 -  AST 15 - 41 U/L 23 24 -  ALT 0 - 44 U/L 12 11 -    Lab Results  Component Value Date   WBC 14.9 (H) 10/03/2020   HGB 10.2 (L) 10/03/2020   HCT 34.2 (L) 10/03/2020   MCV 94.5 10/03/2020   PLT 357 10/03/2020   NEUTROABS 13.0 (H) 10/03/2020    DG Chest 2 View  Result Date: 09/30/2020 CLINICAL DATA:  Shortness of breath x2 days. EXAM: CHEST - 2 VIEW COMPARISON:  August 14, 2020 FINDINGS: There is marked severity opacification of the right hemithorax with a mild amount of aerated lung seen along the medial aspect of the mid and upper right lung. This is increased in severity when compared to the prior study. Moderate severity left basilar atelectasis and/or infiltrate is seen. There is a small left pleural effusion. No pneumothorax is identified. Mild to moderate severity enlargement of the cardiac silhouette is seen. An ill-defined lobulated  opacity is seen along the superior mediastinum on the left. A chronic eighth right rib fracture is noted. IMPRESSION: 1. Large right pleural effusion with associated extensive right upper lobe, right middle lobe and right lower lobe atelectasis and/or infiltrate. 2. Moderate severity left basilar atelectasis and/or infiltrate with a small left pleural effusion. 3. Findings consistent with the patient's known prevascular/anterior mediastinal soft tissue mass. Electronically Signed   By: Virgina Norfolk M.D.   On: 09/30/2020 02:38   CT CHEST W CONTRAST  Result Date: 09/28/2020 CLINICAL DATA:  Small cell lung cancer restaging EXAM: CT CHEST WITH CONTRAST TECHNIQUE:  Multidetector CT imaging of the chest was performed during intravenous contrast administration. CONTRAST:  37mL OMNIPAQUE IOHEXOL 300 MG/ML  SOLN COMPARISON:  07/07/2020 FINDINGS: Cardiovascular: Aortic atherosclerosis. Cardiomegaly. Left and right coronary artery calcifications. No pericardial effusion. Mediastinum/Nodes: Significant interval enlargement of a bulky prevascular/anterior mediastinal soft tissue mass which encases the left carotid and subclavian artery origins as well as the left brachiocephalic vein, measuring 6.4 x 5.4 cm, previously 4.5 x 3.4 cm when measured similarly (series 2, image 35). Additional enlarged lower cervical lymph nodes, incompletely imaged. There are newly enlarged abnormal right axillary lymph nodes, largest measuring 2.8 x 1.9 cm, previously 2.5 x 1.0 cm (series 2, image 53). Numerous enlarged right epicardial lymph nodes measuring up to 1.8 x 1.0 cm, previously 1.5 x 0.9 cm (series 2, image 100). Thyroid gland, trachea, and esophagus demonstrate no significant findings. Lungs/Pleura: Large right pleural effusion and associated atelectasis or consolidation, significantly increased compared to prior examination. There is extensive, nodular pleural thickening throughout the right hemithorax and to a lesser extent the left hemithorax. There is dense, masslike consolidation of the suprahilar right upper lobe and the right upper lobe is almost completely consolidated. Discrete mass cannot easily be measured in the right lung. There are multiple new and enlarged pulmonary nodules of the left lung, an index nodule of the anterior left upper lobe measuring 1.3 x 1.0 cm, previously no greater than 3 mm (series 4, image 31). Upper Abdomen: There are numerous new and enlarged bulky lymph nodes and soft tissue nodules about the right adrenal gland, celiac axis, and portacaval nodes, overall conglomerate in this vicinity measuring approximately 7.6 x 4.4 cm (series 2, image 145). Nodule of  the left adrenal gland measuring 1.4 x 1.4 cm. Musculoskeletal: No chest wall mass or suspicious bone lesions identified. IMPRESSION: 1. Significant interval enlargement of a bulky prevascular/anterior mediastinal soft tissue mass which encases the left carotid and subclavian artery origins as well as the left brachiocephalic vein, as well as numerous lower cervical, epicardial, and upper abdominal lymph nodes. 2. Large right pleural effusion and associated atelectasis or consolidation, significantly increased compared to prior examination. Discrete mass cannot be easily measured in the right lung. 3. There is extensive, nodular pleural thickening throughout the right hemithorax and to a lesser extent the left hemithorax. 4. There are multiple new and enlarged pulmonary nodules of the left lung. 5. Nodule of the left adrenal gland measuring 1.4 x 1.4 cm, not previously imaged and suspicious for adrenal metastasis. 6. Constellation of findings is consistent with interval worsening of lung malignancy, presumably of the right upper lobe, with extensive nodal, bilateral pleural, and pulmonary metastatic disease. 7. Cardiomegaly and coronary artery disease. Aortic Atherosclerosis (ICD10-I70.0). Electronically Signed   By: Eddie Candle M.D.   On: 09/28/2020 14:52   DG CHEST PORT 1 VIEW  Result Date: 10/03/2020 CLINICAL DATA:  Shortness of breath.  Pneumothorax EXAM: PORTABLE CHEST 1 VIEW COMPARISON:  October 02, 2020 FINDINGS: Chest tube remains on the right with moderate pneumothorax on the right which appears loculated superiorly, similar to 1 day prior. There is now effusion on the right with apparent hydropneumothorax on the right currently. There is consolidation in the left base with small left pleural effusion. There is a stable medial left upper lobe mass. Heart is upper normal in size with pulmonary vascularity grossly normal. There is aortic atherosclerosis. No bone lesions. There is carotid artery  calcification bilaterally. IMPRESSION: PleurX catheter now present on the right with reaccumulation of pleural fluid in the right base. Pneumothorax remains on the right which may be partially loculated. No tension component. Consolidation left lower lobe with small left pleural effusion, stable. Left medial upper lobe/apical mass, grossly stable. Stable cardiac silhouette. Carotid artery calcification bilaterally. Aortic Atherosclerosis (ICD10-I70.0). Electronically Signed   By: Lowella Grip III M.D.   On: 10/03/2020 07:56   DG Chest Port 1 View  Result Date: 10/02/2020 CLINICAL DATA:  Right hydropneumothorax follow-up status post PleurX catheter placement. EXAM: PORTABLE CHEST 1 VIEW COMPARISON:  Chest x-ray from same day at 0419 hours. FINDINGS: Interval placement of a right-sided PleurX catheter with essentially resolved right pleural effusion. Decreased now moderate right pneumothorax with partial re-expansion of the right lung. Patchy consolidation in the right lung related to underlying tumor. Unchanged left apical paramediastinal mass and multiple pulmonary nodules in the left lung. Unchanged small left pleural effusion. Stable heart size. No acute osseous abnormality. IMPRESSION: 1. Interval placement of a right-sided PleurX catheter with essentially resolved right pleural effusion, decreased now moderate right pneumothorax, and partial re-expansion of the right lung. 2. Unchanged pulmonary metastatic disease. Electronically Signed   By: Titus Dubin M.D.   On: 10/02/2020 16:27   DG CHEST PORT 1 VIEW  Result Date: 10/02/2020 CLINICAL DATA:  Dyspnea EXAM: PORTABLE CHEST 1 VIEW COMPARISON:  10/01/2020 FINDINGS: Large right hydropneumothorax is again identified with progressive, now near complete, collapse of the right lung. Small left pleural effusion is unchanged. Multiple peripheral pulmonary nodules are again identified within the left lung. Left apical paramediastinal mass again  identified. Cardiac size within normal limits. No acute bone abnormality. IMPRESSION: Large right hydropneumothorax with progressive, now near complete collapse of the right lung. Multiple pulmonary nodules within the left lung in keeping with pulmonary metastatic disease. Stable small left pleural effusion. Electronically Signed   By: Fidela Salisbury MD   On: 10/02/2020 05:18   DG CHEST PORT 1 VIEW  Result Date: 10/01/2020 CLINICAL DATA:  Pneumothorax.  Shortness of breath. EXAM: PORTABLE CHEST 1 VIEW COMPARISON:  09/30/2020 FINDINGS: Slight interval increase in the moderate right hydropneumothorax. Persistent retrocardiac left base collapse/consolidation with probable small left pleural effusion. Left paramediastinal opacity in the upper left chest is compatible with the patient's known mass lesion. Bones are diffusely demineralized. IMPRESSION: 1. Slight interval increase in the moderate right hydropneumothorax. 2. Otherwise stable exam. Electronically Signed   By: Misty Stanley M.D.   On: 10/01/2020 11:00   DG Chest Port 1 View  Result Date: 09/30/2020 CLINICAL DATA:  87 year old female with recurrent right-sided pleural effusion secondary to known advanced small cell carcinoma, referred for thoracentesis EXAM: PORTABLE CHEST 1 VIEW COMPARISON:  CT 09/27/2020, chest x-ray 08/14/2020, chest x-ray 09/30/2020 FINDINGS: Cardiomediastinal silhouette likely unchanged, borderline enlarged heart border. Right-sided hydropneumothorax status post right-sided thoracentesis with significant reduction in the volume right-sided pleural fluid. Consolidative changes in the right lung, compatible  with known pleural/parenchymal metastatic disease. Opacity at the left lung apex, suprahilar region, compatible with known mediastinal/pleural involvement on prior CT. Nodular changes of the left lung compatible with known malignancy and better seen on prior CT. IMPRESSION: Right-sided ex vacuo hydropneumothorax is favored after  thoracentesis performed. Consolidative changes of the right lung in this patient with known metastatic small cell carcinoma. Left apical mass with nodular changes, compatible with known malignancy. Electronically Signed   By: Corrie Mckusick D.O.   On: 09/30/2020 12:01   IR PERC PLEURAL DRAIN W/INDWELL CATH W/IMG GUIDE  Result Date: 10/02/2020 INDICATION: 87 year old with history of small cell lung cancer and recurrent malignant pleural effusion. Patient presented with respiratory distress and a large right pleural effusion. Thoracentesis was performed and the follow-up chest radiograph demonstrated a ex vacuo pneumothorax compatible with a trapped right lung. Subsequent chest radiograph demonstrated increased collapse of the right lung. Patient is receiving palliative care and primary service requested placement of a tunneled pleural catheter. EXAM: PLACEMENT OF RIGHT CHEST TUNNELED PLEURAL CATHETER WITH ULTRASOUND AND FLUOROSCOPIC GUIDANCE MEDICATIONS: Moderate sedation ANESTHESIA/SEDATION: Fentanyl 50 mcg IV; Versed 0.5 mg IV Moderate Sedation Time:  25 minutes The patient was continuously monitored during the procedure by the interventional radiology nurse under my direct supervision. FLUOROSCOPY TIME:  42 seconds, 14 mGy COMPLICATIONS: None immediate. PROCEDURE: Informed consent was obtained for a tunneled pleural catheter. A timeout was performed prior to the initiation of the procedure. Patient was placed on the interventional table. Ultrasound demonstrated a loculated right pleural effusion along with right pleural air. The right side of the chest was prepped and draped in sterile fashion. Maximal barrier sterile technique was utilized including caps, mask, sterile gowns, sterile gloves, sterile drape, hand hygiene and skin antiseptic. Skin was anesthetized using 1% lidocaine. Small incision was made along the right lateral lower chest. Using ultrasound guidance, a Yueh catheter was directed into the  loculated pleural effusion and amber colored fluid was aspirated. A second incision was made more medially in the right lower chest. PleurX catheter was tunneled between the 2 incisions. The cuff was placed underneath the skin. A superstiff Amplatz wire was advanced into the right pleural space using fluoroscopic guidance. Catheter was cut to an appropriate length. The peel-away sheath was placed over the wire and the PleurX catheter was advanced into the pleural space. 550 mL of amber/blood tinged fluid was removed from the right pleural space along with a large amount of air. The pleural entrance skin site was closed using absorbable suture and Dermabond. Catheter exit site was secured to skin with suture. Dressing was placed. FINDINGS: Ultrasound demonstrated a loculated right pleural effusion. Large amount of pleural air on fluoroscopy. PleurX catheter was successfully placed in the right pleural space and 550 mL of fluid was removed. IMPRESSION: Successful placement of a right chest tunneled pleural catheter using ultrasound and fluoroscopic guidance. Portable chest radiograph was performed following catheter placement and demonstrated improved aeration in the right lung but a persistent large right pneumothorax. This is compatible with an ex vacuo pneumothorax and trapped right lung secondary to the patient's malignancy. Suspect this is the baseline aeration for the right lung. Electronically Signed   By: Markus Daft M.D.   On: 10/02/2020 17:19   US THORACENTESIS ASP PLEURAL SPACE W/IMG GUIDE  Result Date: 09/30/2020 INDICATION: 87 year old female with a history of small cell carcinoma and recurrent right-sided pleural effusion EXAM: ULTRASOUND GUIDED RIGHT THORACENTESIS MEDICATIONS: None. COMPLICATIONS: None PROCEDURE: An ultrasound guided thoracentesis was  thoroughly discussed with the patient and questions answered. The benefits, risks, alternatives and complications were also discussed. The patient  understands and wishes to proceed with the procedure. Written consent was obtained. Ultrasound was performed to localize and mark an adequate pocket of fluid in the right chest. The area was then prepped and draped in the normal sterile fashion. 1% Lidocaine was used for local anesthesia. Under ultrasound guidance a 8 Fr Safe-T-Centesis catheter was introduced. Thoracentesis was performed. The catheter was removed when the patient complained of some chest discomfort and a dressing applied. FINDINGS: A total of approximately 900 cc of thin amber fluid was removed. IMPRESSION: Status post ultrasound-guided right-sided thoracentesis. Signed, Dulcy Fanny. Dellia Nims, RPVI [facility] Electronically Signed   By: Corrie Mckusick D.O.   On: 09/30/2020 13:11    ASSESSMENT AND PLAN: 1. Right lung mass concerning for malignancy -05/15/2020-CT chest with contrast showed a 7.2 x 5.9 cm right upper lobe lung mass with invasion into the right hilum and mediastinum, extensive mediastinal and right hilar lymphadenopathy, extensive interstitial spread of tumor in the right lung and large loculated right pleural fluid collection likely secondary to pleural metastatic disease, scattered pulmonary nodules in the left lung suspicious for metastatic disease, 13 x 10.5 mm spiculated lesion in the lingula which could be a second primary. -Cytology on pleural fluid 11/23/2021positivefor malignant cells;cluster of cells positive forMOC31, TTF-1,synaptophysinand CD 56; possibility of small cell carcinoma raised -Cytology on pleural fluid 05/26/2020-malignant cells present with immunophenotype raising possibility of small cell carcinoma -Biopsy right supraclavicular lymph node 06/09/2020-small cell carcinoma -Cycle 1 modified Carboplatin/Etoposide plus atezolizumab 06/14/2020 -CT chest 07/07/2020-large tumor in the right chest surrounding the right mainstem bronchus and  narrowing the right upper lobe bronchus, unchanged large prevascular node -Palliative radiation to the right chest beginning 07/11/2020-07/31/2020 -Cycle 2 modified Carboplatin/Etoposide plus atezolizumab 08/02/2020 -Cycle 3 modified carboplatin/etoposide plus atezolizumab 08/30/2020 -CTs 09/27/2020-significant interval enlargement of bulky prevascular/anterior mediastinal soft tissue mass which encases the left carotid and subclavian artery origins as well as the left brachiocephalic vein, numerous lower cervical, epicardial and upper abdominal lymph nodes.  Large right pleural effusion and associated atelectasis or consolidation significantly increased compared to prior study.  Discrete mass cannot be easily measured in the right lung.  Extensive nodular pleural thickening throughout the right hemithorax and to a lesser extent the left hemithorax.  Multiple new and enlarged pulmonary nodules at the left lung.  Nodule of the left adrenal gland.  Numerous new enlarged bulky lymph nodes and soft tissue nodules about the right adrenal gland, celiac axis and portacaval nodes. 2. Pleural effusion-malignant 3.Postobstructive pneumonia? 4. Hyponatremia 5. Mild normocytic anemia 6. Thrombocytosis 7. Hypertension 8. AAA status post repair in 2009 9. PAD 10. Hyperlipidemia 11. Depression  12. 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 Hospital admission 07/07/2020-dyspnea secondary to lung tumor and pleural effusion 16.  Hospital admission 09/30/2020-acute on chronic respiratory failure, pleural effusion  Mary Merritt was admitted secondary to shortness of breath.  She was found to have a pleural effusion and underwent thoracentesis subsequently had a Pleurx catheter placed.  She continues to remain short of breath and hypoxic this morning.  Chest x-ray shows recurrent pleural effusion and pneumothorax.  Has been seen by PCCM recommends suction and attachment to drainage system.  Appreciate their  assistance.  The patient plans to charged home with hospice once stable.  She is a DNR/DNI.  Recommendations: 1.  Management of pleural effusion/pneumothorax poor pulmonary medicine 2.  She  will discharge home with hospice or transfer to [facility] once medically stable. 3.  DNR/DNI.    LOS: 2 days   Mikey Bussing, DNP, AGPCNP-BC, AOCNP 10/03/20 Mary Merritt was interviewed and examined.  She enrolled in hospice care after a CT on 09/27/2020 revealed disease progression.  She was admitted with increased dyspnea on 09/30/2020.  She underwent a thoracentesis and placement of a Pleurx catheter.  She appeared dyspneic and had significant hypoxia when I saw her earlier this morning.  She has been evaluated by pulmonary medicine and interventional radiology.  I recommend continuing comfort care measures to include treatment of the pleural effusion/pneumothorax, and narcotic analgesics.  Mary Merritt has a poor prognosis for survival beyond weeks.  She is a candidate for [employer] [facility] if she and her family desire placement with hospice.  I am available to speak with her family.  I was present for greater than 50% of today's visit.  I performed medical decision making.

## 2020-10-03 NOTE — Progress Notes (Signed)
PT Cancellation Note  Patient Details Name: Mary Merritt MRN: [id number] DOB: [birth year]   Cancelled Treatment:    Reason Eval/Treat Not Completed: PT screened, no needs identified, will sign off  Pt being seen this morning by staff for correct connections for pleurX catheter.  Per Dr. Rowe Pavy, no physical therapy needs prior to d/c.  Plan is currently d/c with hospice.  PT to sign off.   Emilija Bohman,KATHrine E 10/03/2020, 11:37 AM Jannette Spanner PT, DPT Acute Rehabilitation Services Pager: [phone] Office: [phone]

## 2020-10-03 NOTE — Consult Note (Signed)
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  She she was admitted 4/9 with worsening dyspnea she had a therapeutic thoracentesis on 4/9 and ex vacuo pneumothorax.  IR was consulted 4/11 and placed right Pleurx catheter.  She has worsening dyspnea and oxygen requirement this morning and PCCM consulted for recommendations prior to going home with hospice.  Pleurx has not been attached to drainage system or suction.  Pertinent  Medical History   has a past medical history of AAA (abdominal aortic aneurysm) ([place]), Anxiety, Arthritis, Dyspnea, Dysrhythmia, History of bronchitis, Hyperlipidemia, Hypertension, and Seasonal allergies.   Significant Hospital Events: Including procedures, antibiotic start and stop dates in addition to other pertinent events   . 4/9, admit to hospitalist, therapeutic thoracentesis . 4/11 Pleurx catheter placed by IR . 4/12 PCCM consult for continued dyspnea, plan to go home with hospice  Interim History / Subjective:  Oxygen requirement increased from 5 L overnight to 10 L this morning, patient tachypneic with increased respiratory effort  Objective   Blood pressure (!) 174/99, pulse (!) 113, temperature 97.6 F (36.4 C), temperature source Oral, resp. rate 18, height 5\' 5"  (1.651 m), weight 64.4 kg, SpO2 92 %.        Intake/Output Summary (Last 24 hours) at 10/03/2020 0900 Last data filed at 10/03/2020 0600 Gross per 24 hour  Intake 400 ml  Output 650 ml  Net -250 ml   Filed Weights   10/02/20 2327  Weight: 64.4 kg    General:  Elderly, chronically ill and uncomfortable appearing female in mild respiratory distress HEENT: MM pink/moist, sclera nonicteric Neuro: Alert, nodding to questions and without obvious neuro deficits CV: s1s2 RRR, no m/r/g PULM: Coarse bilaterally, accessory muscle use, tachypnea, difficulty speaking in full sentences, 10 L nasal cannula GI: soft, bsx4 active  Extremities: warm/dry, 1+ edema  Skin: no rashes or lesions   Labs/imaging that I havepersonally reviewed  (right click and "Reselect all SmartList Selections" daily)  CXR 4/12>> continued right pneumothorax  Resolved Hospital Problem list     Assessment & Plan:    Acute hypoxic respiratory failure secondary to right hydropneumothorax in the setting of small cell lung cancer and recurrent effusions  Pleurx placed yesterday, has not been attached to collection system or drainage Plan to discharge home with hospice P: -Suction and attachment to drainage system would be most beneficial in the setting of pneumothorax, orders placed -Repeat CXR this afternoon -CM following, patient going home with hospice through [facility], case management assisting with arranging Pleurx home supplies    Labs   CBC: Recent Labs  Lab 09/26/20 1417 09/30/20 0145 10/01/20 0327 10/02/20 0323 10/03/20 0616  WBC 9.3 8.8 9.3 7.8 14.9*  NEUTROABS 7.5 7.1  --  5.7 13.0*  HGB 9.9* 10.0* 9.6* 9.5* 10.2*  HCT 33.0* 31.4* 31.1* 31.1* 34.2*  MCV 90.9 89.7 91.5 93.1 94.5  PLT 382 376 301 346 559    Basic Metabolic Panel: Recent Labs  Lab 09/30/20 0145 10/01/20 0327 10/01/20 1431 10/02/20 0323 10/03/20 0616  NA 125* 125* 129* 128* 130*  K 4.7 2.9* 3.2* 3.6 3.9  CL 80* 80* 82* 82* 81*  CO2 33* 31 36* 34* 35*  GLUCOSE 114* 93 140* 108* 152*  BUN 9 7* 8 8 10   CREATININE 0.56 0.37* 0.65 0.43* 0.48  CALCIUM 8.6* 8.4* 8.6* 9.1 9.0  MG  --   --   --  1.1* 1.5*  PHOS  --   --   --  2.3* 3.9   GFR: Estimated Creatinine Clearance: 42.1  mL/min (by C-G formula based on SCr of 0.48 mg/dL). Recent Labs  Lab 09/30/20 0142 09/30/20 0145 10/01/20 0327 10/02/20 0323 10/03/20 0616  WBC  --  8.8 9.3 7.8 14.9*  LATICACIDVEN 1.6  --   --   --   --     Liver Function Tests: Recent Labs  Lab 09/26/20 1417 09/30/20 0145 10/02/20 0323 10/03/20 0616  AST 18 58* 24 23  ALT 8 12 11 12   ALKPHOS 67 72 64 67  BILITOT 0.3 2.1* 0.6 0.6  PROT 6.4* 6.2* 5.9* 6.2*  ALBUMIN 3.4* 2.9* 2.8* 2.9*   No results for input(s): LIPASE, AMYLASE in the last 168 hours. No results for input(s): AMMONIA in the last 168 hours.  ABG    Component Value Date/Time   PHART 7.443 (H) 09/01/2008 0341   PCO2ART 33.9 (L) 09/01/2008 0341   PO2ART 65.0 (L) 09/01/2008 0341   HCO3 23.2 09/01/2008 0341   TCO2 26 04/07/2020 1013   O2SAT 93.0 09/01/2008 0341     Coagulation Profile: Recent Labs  Lab 09/30/20 0145  INR 1.0    Cardiac Enzymes: No results for input(s): CKTOTAL, CKMB, CKMBINDEX, TROPONINI in the last 168 hours.  HbA1C: No results found for: HGBA1C  CBG: No results for input(s): GLUCAP in the last 168 hours.  Review of Systems:   Review of Systems - History obtained from child, chart review and the patient General ROS: negative positive for  - malaise and patient very dyspneic, negative for chest pain other ROS as noted in HPI   Past Medical History:  She,  has a past medical history of AAA (abdominal aortic aneurysm) ([place]), Anxiety, Arthritis, Dyspnea, Dysrhythmia, History of bronchitis, Hyperlipidemia, Hypertension, and Seasonal allergies.   Surgical History:   Past Surgical History:  Procedure Laterality Date  . ABDOMINAL AORTIC ANEURYSM REPAIR     2009, Dr. Scot Dock  . ABDOMINAL AORTOGRAM W/LOWER EXTREMITY Right 04/07/2020   Procedure: ABDOMINAL AORTOGRAM W/LOWER EXTREMITY;  Surgeon: Angelia Mould, MD;  Location: [facility];  Service: Cardiovascular;  Laterality: Right;  . CARDIAC CATHETERIZATION      patient thinks it was in 09 with dr Scot Dock  . cataracts removed    . INCONTINENCE SURGERY    . IR PERC PLEURAL DRAIN W/INDWELL CATH W/IMG GUIDE  10/02/2020  . IR THORACENTESIS ASP PLEURAL SPACE W/IMG GUIDE  06/07/2020  . IR THORACENTESIS ASP PLEURAL SPACE W/IMG GUIDE  06/14/2020  . IR THORACENTESIS ASP PLEURAL SPACE W/IMG GUIDE  07/24/2020  . IR THORACENTESIS ASP PLEURAL SPACE W/IMG GUIDE  08/14/2020  . THORACENTESIS N/A 05/16/2020   Procedure: Mathews Robinsons;  Surgeon: Lanier Clam, MD;  Location[facility];  Service: Pulmonary;  Laterality: N/A;  . TOTAL HIP ARTHROPLASTY Right 08/01/2014   Procedure: RIGHT TOTAL HIP ARTHROPLASTY ANTERIOR APPROACH;  Surgeon: Marianna Payment, MD;  Location: [facility];  Service: Orthopedics;  Laterality: Right;  . TOTAL HIP ARTHROPLASTY Left 03/27/2016   Procedure: LEFT TOTAL HIP ARTHROPLASTY ANTERIOR APPROACH;  Surgeon: Leandrew Koyanagi, MD;  Location: [facility];  Service: Orthopedics;  Laterality: Left;     Social History:   reports that she quit smoking about 36 years ago. She has a 30.00 pack-year smoking history. She has never used smokeless tobacco. She reports that she does not drink alcohol and does not use drugs.   Family History:  Her family history includes Heart failure in her father; Stroke in her mother.   Allergies Allergies  Allergen Reactions  . Percocet [Oxycodone-Acetaminophen] Other (See Comments)    Makes head feel funny     Home Medications  Prior to Admission medications   Medication Sig Start Date End Date Taking? Authorizing Provider  aspirin EC 81 MG tablet Take 81 mg by mouth daily.   Yes [provider]  diphenhydrAMINE (BENADRYL) 25 mg capsule Take 25 mg by mouth at bedtime as needed for sleep.   Yes [provider]  escitalopram (LEXAPRO) 10 MG tablet Take 10 mg by mouth daily.   Yes [provider]  feeding supplement (ENSURE ENLIVE / ENSURE PLUS) LIQD Take 237 mLs by mouth 3 (three) times daily  between meals. 07/14/20  Yes Mercy Riding, MD  HYDROcodone-acetaminophen (NORCO) 5-325 MG tablet Take 1 tablet by mouth every 6 (six) hours as needed for moderate pain. 09/27/20  Yes Owens Shark, NP  ibuprofen (ADVIL) 200 MG tablet Take 400 mg by mouth every 4 (four) hours as needed for mild pain.   Yes [provider]  metoprolol tartrate (LOPRESSOR) 25 MG tablet Take 0.5 tablets (12.5 mg total) by mouth 2 (two) times daily. 12/27/19  Yes Hilty, Nadean Corwin, MD  polyethylene glycol (MIRALAX / GLYCOLAX) 17 g packet Take 17 g by mouth 2 (two) times daily as needed. Patient taking differently: Take 17 g by mouth 2 (two) times daily as needed for mild constipation. 07/14/20  Yes Mercy Riding, MD  chlorpheniramine-HYDROcodone (TUSSIONEX) 10-8 MG/5ML SUER Take 5 mLs by mouth every 12 (twelve) hours as needed for cough. 09/28/20   Owens Shark, NP     Critical care time:  40 minutes       Otilio Carpen Joyanna Kleman, PA-C [facility] See Amion for pager If no response to pager , please call [phone] until 7pm After 7:00 pm call Elink  [phone]?[place]

## 2020-10-03 NOTE — Plan of Care (Signed)
  Problem: Activity: Goal: Risk for activity intolerance will decrease Outcome: Progressing   Problem: Coping: Goal: Level of anxiety will decrease Outcome: Progressing   Problem: Pain Managment: Goal: General experience of comfort will improve Outcome: Progressing   

## 2020-10-04 ENCOUNTER — Inpatient Hospital Stay (HOSPITAL_COMMUNITY)

## 2020-10-04 DIAGNOSIS — R0603 Acute respiratory distress: Secondary | ICD-10-CM | POA: Diagnosis not present

## 2020-10-04 DIAGNOSIS — E871 Hypo-osmolality and hyponatremia: Secondary | ICD-10-CM | POA: Diagnosis not present

## 2020-10-04 DIAGNOSIS — Z515 Encounter for palliative care: Secondary | ICD-10-CM | POA: Diagnosis not present

## 2020-10-04 DIAGNOSIS — I10 Essential (primary) hypertension: Secondary | ICD-10-CM | POA: Diagnosis not present

## 2020-10-04 LAB — CBC WITH DIFFERENTIAL/PLATELET
Abs Immature Granulocytes: 0.08 10*3/uL — ABNORMAL HIGH (ref 0.00–0.07)
Basophils Absolute: 0 10*3/uL (ref 0.0–0.1)
Basophils Relative: 0 %
Eosinophils Absolute: 0 10*3/uL (ref 0.0–0.5)
Eosinophils Relative: 0 %
HCT: 33.5 % — ABNORMAL LOW (ref 36.0–46.0)
Hemoglobin: 10.2 g/dL — ABNORMAL LOW (ref 12.0–15.0)
Immature Granulocytes: 1 %
Lymphocytes Relative: 3 %
Lymphs Abs: 0.5 10*3/uL — ABNORMAL LOW (ref 0.7–4.0)
MCH: 28.6 pg (ref 26.0–34.0)
MCHC: 30.4 g/dL (ref 30.0–36.0)
MCV: 93.8 fL (ref 80.0–100.0)
Monocytes Absolute: 1 10*3/uL (ref 0.1–1.0)
Monocytes Relative: 6 %
Neutro Abs: 15.7 10*3/uL — ABNORMAL HIGH (ref 1.7–7.7)
Neutrophils Relative %: 90 %
Platelets: 372 10*3/uL (ref 150–400)
RBC: 3.57 MIL/uL — ABNORMAL LOW (ref 3.87–5.11)
RDW: 18.2 % — ABNORMAL HIGH (ref 11.5–15.5)
WBC: 17.3 10*3/uL — ABNORMAL HIGH (ref 4.0–10.5)
nRBC: 0 % (ref 0.0–0.2)

## 2020-10-04 LAB — MAGNESIUM: Magnesium: 1.8 mg/dL (ref 1.7–2.4)

## 2020-10-04 LAB — COMPREHENSIVE METABOLIC PANEL
ALT: 14 U/L (ref 0–44)
AST: 24 U/L (ref 15–41)
Albumin: 3 g/dL — ABNORMAL LOW (ref 3.5–5.0)
Alkaline Phosphatase: 68 U/L (ref 38–126)
Anion gap: 15 (ref 5–15)
BUN: 13 mg/dL (ref 8–23)
CO2: 40 mmol/L — ABNORMAL HIGH (ref 22–32)
Calcium: 9.3 mg/dL (ref 8.9–10.3)
Chloride: 79 mmol/L — ABNORMAL LOW (ref 98–111)
Creatinine, Ser: 0.61 mg/dL (ref 0.44–1.00)
GFR, Estimated: >60 mL/min (ref >60)
Glucose, Bld: 129 mg/dL — ABNORMAL HIGH (ref 70–99)
Potassium: 3.2 mmol/L — ABNORMAL LOW (ref 3.5–5.1)
Sodium: 134 mmol/L — ABNORMAL LOW (ref 135–145)
Total Bilirubin: 0.9 mg/dL (ref 0.3–1.2)
Total Protein: 6.5 g/dL (ref 6.5–8.1)

## 2020-10-04 LAB — PHOSPHORUS: Phosphorus: 2.8 mg/dL (ref 2.5–4.6)

## 2020-10-04 MED ORDER — MORPHINE SULFATE 10 MG/5ML PO SOLN: 5.0000 mg | ORAL | 0 refills | Status: AC | PRN

## 2020-10-04 MED ORDER — POTASSIUM CHLORIDE 10 MEQ/100ML IV SOLN
10.0000 meq | INTRAVENOUS | Status: AC
  Administered 2020-10-04 (×3): 10 meq via INTRAVENOUS
  Filled 2020-10-04 (×3): qty 100

## 2020-10-04 NOTE — Progress Notes (Signed)
Daily Progress Note   Patient Name: Mary Merritt       Date: 10/04/2020 DOB: Jan 09, 1928  Age: 87 y.o. MRN#: [id number] Attending Physician: Patrecia Pour, MD Primary Care Physician: Katherina Mires, MD Admit Date: 09/30/2020  Reason for Consultation/Follow-up: Non pain symptom management  Subjective: Patient is resting in bed, not awake, not alert, son [name] at bedside, discussed with him about end of life signs and symptoms and the type of care that is given inside a residential hospice setting.    Length of Stay: 3  Current Medications: Scheduled Meds:  . aspirin EC  81 mg Oral Daily  . enoxaparin (LOVENOX) injection  40 mg Subcutaneous Q24H  . escitalopram  10 mg Oral Daily  . guaiFENesin  1,200 mg Oral BID  . ipratropium  0.5 mg Nebulization Q6H  . levalbuterol  0.63 mg Nebulization Q6H  . metoprolol tartrate  12.5 mg Oral BID  . sodium chloride flush  3 mL Intravenous Q12H    Continuous Infusions: . azithromycin 500 mg (10/03/20 1100)  . cefTRIAXone (ROCEPHIN)  IV 2 g (10/04/20 0943)  . potassium chloride 10 mEq (10/04/20 0948)    PRN Meds: metoprolol tartrate, morphine, morphine injection, polyethylene glycol  Physical Exam         General: resting in bed, awakens some, noted to be in mild to moderate respiratory distress.  HEENT: No bruits, no goiter, no JVD Heart: Regular rate and rhythm. No murmur appreciated. Lungs:  coarse breath sounds anteriorly Abdomen: Soft, nontender, nondistended, positive bowel sounds.  Ext: No significant edema Skin: Warm and dry Neuro: Grossly intact, nonfocal.  Vital Signs: BP 126/68   Pulse 84   Temp 97.7 F (36.5 C) (Oral)   Resp 20   Ht 5\' 5"  (1.651 m)   Wt 64.4 kg   SpO2 94%   BMI 23.63 kg/m  SpO2: SpO2: 94 % O2 Device:  O2 Device: High Flow Nasal Cannula O2 Flow Rate: O2 Flow Rate (L/min): 11 L/min  Intake/output summary:   Intake/Output Summary (Last 24 hours) at 10/04/2020 1058 Last data filed at 10/04/2020 1000 Gross per 24 hour  Intake 1371.85 ml  Output 490 ml  Net 881.85 ml   LBM: Last BM Date: 09/30/20 Baseline Weight: Weight: 64.4 kg Most recent weight: Weight: 64.4 kg  Palliative Assessment/Data:    Flowsheet Rows   Flowsheet Row Most Recent Value  Intake Tab   Referral Department Hospitalist  Unit at Time of Referral Med/Surg Unit  Palliative Care Primary Diagnosis Cancer  Date Notified 09/30/20  Palliative Care Type New Palliative care  Reason for referral Non-pain Symptom  Date of Admission 09/30/20  Date first seen by Palliative Care 10/01/20  # of days Palliative referral response time 1 Day(s)  # of days IP prior to Palliative referral 0  Clinical Assessment   Palliative Performance Scale Score 40%  Psychosocial & Spiritual Assessment   Palliative Care Outcomes       Patient Active Problem List   Diagnosis Date Noted  . Hospice care patient 10/04/2020  . Pressure injury of skin 10/01/2020  . Respiratory distress 09/30/2020  . Pleural effusion 07/07/2020  . Small cell lung cancer ([place]) 06/05/2020  . Goals of care, counseling/discussion 06/05/2020  . Abnormal chest x-ray   . Pneumonia 05/14/2020  . Pain in right foot 11/17/2019  . Hyponatremia 11/22/2017  . Nondisplaced fracture of left tibial spine, subsequent encounter for closed fracture with routine healing 08/05/2017  . Chronic pain of left knee 07/21/2017  . Presence of left artificial hip joint 05/30/2016  . Hip joint replacement status 03/27/2016  . S/P hip replacement 07/27/2015  . Osteoarthritis of right hip 08/01/2014  . Essential hypertension 05/31/2014  . Mixed hyperlipidemia 05/31/2014  . S/P AAA repair 05/31/2014  . Bradycardia 05/31/2014  . Murmur 05/31/2014    Palliative Care  Assessment & Plan   Patient Profile: 87 year old female on hospice with [facility] due to small cell lung cancer who was admitted secondary to shortness of breath from recurrent pleural effusion.  Plan for Pleurx catheter  Recommendations/Plan:  DNR/DNI  Patient underwent placement of tunneled pleural catheter on the right side on 10-02-20, 550 mL of blood-tinged pleural fluid was removed.   Morphine p.o. or IV as needed for shortness of breath.     Residential hospice  Comfort measures.   Code Status:    Code Status Orders  (From admission, onward)         Start     Ordered   09/30/20 0808  Do not attempt resuscitation (DNR)  Continuous       Question Answer Comment  In the event of cardiac or respiratory ARREST Do not call a "code blue"   In the event of cardiac or respiratory ARREST Do not perform Intubation, CPR, defibrillation or ACLS   In the event of cardiac or respiratory ARREST Use medication by any route, position, wound care, and other measures to relive pain and suffering. May use oxygen, suction and manual treatment of airway obstruction as needed for comfort.      09/30/20 0807        Code Status History    Date Active Date Inactive Code Status Order ID Comments User Context   07/14/2020 1141 07/14/2020 2232 DNR [id number]  Mercy Riding, MD Inpatient   07/07/2020 2209 07/14/2020 1141 Partial Code [id number]  Elwyn Reach, MD ED   05/14/2020 1839 05/16/2020 2356 Partial Code [id number]  Gifford Shave, MD ED   04/07/2020 1321 04/07/2020 2233 Full Code [id number]  Angelia Mould, MD Inpatient   11/22/2017 1815 11/24/2017 1558 Full Code [id number]  Mariel Aloe, MD Inpatient   11/22/2017 0825 11/22/2017 1815 DNR [id number]  Mariel Aloe, MD Inpatient   03/27/2016 1837 03/29/2016 1611 Full Code [id number]  Frankey Shown  M, MD Inpatient   08/01/2014 1703 08/03/2014 1647 Full Code [id number]  Marianna Payment, MD Inpatient   Advance Care Planning  Activity       Prognosis:   hours to days.   Discharge Planning:  residential Hospice  Care plan was discussed with patient's son.  Thank you for allowing the Palliative Medicine Team to assist in the care of this patient.   Total Time 25 Prolonged Time Billed No      Greater than 50%  of this time was spent counseling and coordinating care related to the above assessment and plan.  Loistine Chance, MD  Please contact Palliative Medicine Team phone at [phone] for questions and concerns.

## 2020-10-04 NOTE — Progress Notes (Signed)
Manufacturing engineer [facility]) Hospital Liaison note.     This patient is approved to transfer to [facility] today.    Please use [facility] as they are contracted to transport [facility] active hospice patients.   RN please call report to [phone].   Thank you,     Farrel Gordon, RN, [facility]       [place] (listed on AMION under Hospice/[facility])     [phone]

## 2020-10-04 NOTE — Discharge Summary (Signed)
Physician Discharge Summary  Mary Merritt YWV:[id number] DOB: 1928-05-20 DOA: 09/30/2020  PCP: Katherina Mires, MD  Admit date: 09/30/2020 Discharge date: 10/04/2020  Admitted From: Home Disposition: Hospice   Recommendations for Outpatient Follow-up:  1. Comfort measures 2. Pleurx catheter management  Home Health: N/A Equipment/Devices: Pleurx Discharge Condition: Stable for transport CODE STATUS: DNR Diet recommendation: As tolerated  Brief/Interim Summary: Mary Merritt is a 87 y.o. female with a history of small cell lung cancer with evidence of disease progression on restaging CT scan following 3 cycles of systemic therapy.  The patient was given the option of salvage systemic therapy versus referral to hospice.  She opted to enroll in hospice.  The patient developed worsening shortness of breath and presented to the emergency room.  Chest x-ray showed a large right pleural effusion.  She underwent an ultrasound-guided thoracentesis on 4/9 and 900 cc of fluid removed. She had a Pleurx catheter placed by IR on 4/11 with subseuqent hypoxia and CXR after Pleurx catheter placement showing a moderate right pneumothorax.  Was seen by PCCM and cathter placed to suction with no appreciable improvement in PTX nor symptoms. Palliative care has been involved in her care. The patient and family opt for hospice placement for comfort measures at end-of-life.   Discharge Diagnoses:  Principal Problem:   Respiratory distress Active Problems:   Essential hypertension   Mixed hyperlipidemia   Hyponatremia   Small cell lung cancer (HCC)   Pleural effusion   Pressure injury of skin   Hospice care patient  Acute Respiratory distress in the setting of chronic hypoxic respiratory failure secondary to recurrent malignant right pleural effusion and extensive right-sided and moderate left atelectasisSmall Cell Lung Cancer -IR thoracentesis yielded 900 cc but repeat CXR after showed "Right-sided ex vacuo  hydropneumothorax is favored after thoracentesis performed.  Consolidative changes of the right lung in this patient with known metastatic small cell carcinoma. Left apical mass with nodular changes, compatible with known malignancy." -Medical oncology recommends continuing comfort measures  Hypokalemia, Hypomagnesemia, Hyponatremia: No further monitoring/Tx.  Leukocytosis: Covered with antibiotics empirically while admitted, though suspected to be a reactive process.  Normocytic Anemia No further monitoring planned.  Hypertension No Tx planned  Depression and Anxiety -Continue home Escitalopram 10 mg po Daily  Hyperlipidemia -Not on home meds  GOC: DNR, poA. -Palliative Care has added Morphine Solution 5 mg po q3hprn Pain and SOB  Estimated body mass index is 23.63 kg/m as calculated from the following:   Height as of this encounter: 5\' 5"  (1.651 m).   Weight as of this encounter: 64.4 kg.  Malnutrition Type:      Malnutrition Characteristics:      Nutrition Interventions:     RN Pressure Injury Documentation: Pressure Injury 09/30/20 Sacrum Mid Stage 2 -  Partial thickness loss of dermis presenting as a shallow open injury with a red, pink wound bed without slough. 1cmx1cmx0cm pink (Active)  09/30/20 1600  Location: Sacrum  Location Orientation: Mid  Staging: Stage 2 -  Partial thickness loss of dermis presenting as a shallow open injury with a red, pink wound bed without slough.  Wound Description (Comments): 1cmx1cmx0cm pink  Present on Admission: Yes    PCCM Palliative Oncology IR  Discharge Instructions Discharge Instructions    Ambulatory Pleural Drainage Schedule   Complete by: As directed    Drain daily, up to max of 1L until patient is only able to drain out 1108ml. If <123ml for 3 consecutive drains every  other day, then call Interventional Radiology [phone]) for evaluation and possible removal.   No wound care   Complete by: As  directed    Pleural Drainage Schedule   Complete by: As directed    Pleural drainage schedule to start 10/03/20 . Drain daily, up to max of 1L until patient is only able to drain out 117ml. If <145ml for 3 consecutive drains then drain every other day. If <155ml for 3 consecutive drains every other day then call the practice that inserted the catheter for evaluation and possible removal. [facility] office [phone]) or Interventional Radiology [phone]) or Dr. Genevive Bi [phone] or [facility] [phone]).     Allergies as of 10/04/2020      Reactions   Percocet [oxycodone-acetaminophen] Other (See Comments)   Makes head feel funny      Medication List    STOP taking these medications   aspirin EC 81 MG tablet   HYDROcodone-acetaminophen 5-325 MG tablet Commonly known as: Norco   ibuprofen 200 MG tablet Commonly known as: ADVIL     TAKE these medications   chlorpheniramine-HYDROcodone 10-8 MG/5ML Suer Commonly known as: TUSSIONEX Take 5 mLs by mouth every 12 (twelve) hours as needed for cough.   diphenhydrAMINE 25 mg capsule Commonly known as: BENADRYL Take 25 mg by mouth at bedtime as needed for sleep.   escitalopram 10 MG tablet Commonly known as: LEXAPRO Take 10 mg by mouth daily.   feeding supplement Liqd Take 237 mLs by mouth 3 (three) times daily between meals.   metoprolol tartrate 25 MG tablet Commonly known as: LOPRESSOR Take 0.5 tablets (12.5 mg total) by mouth 2 (two) times daily.   morphine 10 MG/5ML solution Take 2.5 mLs (5 mg total) by mouth every 3 (three) hours as needed (pain or shortness of breath).   polyethylene glycol 17 g packet Commonly known as: MIRALAX / GLYCOLAX Take 17 g by mouth 2 (two) times daily as needed. What changed: reasons to take this       Follow-up Information    Briscoe, Jannifer Rodney, MD Follow up.   Specialty: Family Medicine Contact information: [place]  [address] [phone]        Pixie Casino, MD .   Specialty: Cardiology Contact information: [address] [place] [address] [phone]              Allergies  Allergen Reactions  . Percocet [Oxycodone-Acetaminophen] Other (See Comments)    Makes head feel funny    Consultations:  See above  Procedures/Studies: DG Chest 2 View  Result Date: 09/30/2020 CLINICAL DATA:  Shortness of breath x2 days. EXAM: CHEST - 2 VIEW COMPARISON:  August 14, 2020 FINDINGS: There is marked severity opacification of the right hemithorax with a mild amount of aerated lung seen along the medial aspect of the mid and upper right lung. This is increased in severity when compared to the prior study. Moderate severity left basilar atelectasis and/or infiltrate is seen. There is a small left pleural effusion. No pneumothorax is identified. Mild to moderate severity enlargement of the cardiac silhouette is seen. An ill-defined lobulated opacity is seen along the superior mediastinum on the left. A chronic eighth right rib fracture is noted. IMPRESSION: 1. Large right pleural effusion with associated extensive right upper lobe, right middle lobe and right lower lobe atelectasis and/or infiltrate. 2. Moderate severity left basilar atelectasis and/or infiltrate with a small left pleural effusion. 3. Findings consistent with the  patient's known prevascular/anterior mediastinal soft tissue mass. Electronically Signed   By: Virgina Norfolk M.D.   On: 09/30/2020 02:38   CT CHEST W CONTRAST  Result Date: 09/28/2020 CLINICAL DATA:  Small cell lung cancer restaging EXAM: CT CHEST WITH CONTRAST TECHNIQUE: Multidetector CT imaging of the chest was performed during intravenous contrast administration. CONTRAST:  70mL OMNIPAQUE IOHEXOL 300 MG/ML  SOLN COMPARISON:  07/07/2020 FINDINGS: Cardiovascular: Aortic atherosclerosis. Cardiomegaly. Left and right coronary artery calcifications. No pericardial  effusion. Mediastinum/Nodes: Significant interval enlargement of a bulky prevascular/anterior mediastinal soft tissue mass which encases the left carotid and subclavian artery origins as well as the left brachiocephalic vein, measuring 6.4 x 5.4 cm, previously 4.5 x 3.4 cm when measured similarly (series 2, image 35). Additional enlarged lower cervical lymph nodes, incompletely imaged. There are newly enlarged abnormal right axillary lymph nodes, largest measuring 2.8 x 1.9 cm, previously 2.5 x 1.0 cm (series 2, image 53). Numerous enlarged right epicardial lymph nodes measuring up to 1.8 x 1.0 cm, previously 1.5 x 0.9 cm (series 2, image 100). Thyroid gland, trachea, and esophagus demonstrate no significant findings. Lungs/Pleura: Large right pleural effusion and associated atelectasis or consolidation, significantly increased compared to prior examination. There is extensive, nodular pleural thickening throughout the right hemithorax and to a lesser extent the left hemithorax. There is dense, masslike consolidation of the suprahilar right upper lobe and the right upper lobe is almost completely consolidated. Discrete mass cannot easily be measured in the right lung. There are multiple new and enlarged pulmonary nodules of the left lung, an index nodule of the anterior left upper lobe measuring 1.3 x 1.0 cm, previously no greater than 3 mm (series 4, image 31). Upper Abdomen: There are numerous new and enlarged bulky lymph nodes and soft tissue nodules about the right adrenal gland, celiac axis, and portacaval nodes, overall conglomerate in this vicinity measuring approximately 7.6 x 4.4 cm (series 2, image 145). Nodule of the left adrenal gland measuring 1.4 x 1.4 cm. Musculoskeletal: No chest wall mass or suspicious bone lesions identified. IMPRESSION: 1. Significant interval enlargement of a bulky prevascular/anterior mediastinal soft tissue mass which encases the left carotid and subclavian artery origins as  well as the left brachiocephalic vein, as well as numerous lower cervical, epicardial, and upper abdominal lymph nodes. 2. Large right pleural effusion and associated atelectasis or consolidation, significantly increased compared to prior examination. Discrete mass cannot be easily measured in the right lung. 3. There is extensive, nodular pleural thickening throughout the right hemithorax and to a lesser extent the left hemithorax. 4. There are multiple new and enlarged pulmonary nodules of the left lung. 5. Nodule of the left adrenal gland measuring 1.4 x 1.4 cm, not previously imaged and suspicious for adrenal metastasis. 6. Constellation of findings is consistent with interval worsening of lung malignancy, presumably of the right upper lobe, with extensive nodal, bilateral pleural, and pulmonary metastatic disease. 7. Cardiomegaly and coronary artery disease. Aortic Atherosclerosis (ICD10-I70.0). Electronically Signed   By: Eddie Candle M.D.   On: 09/28/2020 14:52   DG CHEST PORT 1 VIEW  Result Date: 10/04/2020 CLINICAL DATA:  Shortness of breath EXAM: PORTABLE CHEST 1 VIEW COMPARISON:  October 03, 2020 FINDINGS: PleurX catheter again noted on the right. Essentially stable hydropneumothorax on the right without tension component. There is a left pleural effusion with atelectasis in the left base. Interstitial prominence in the left upper lobe may in part be due to redistribution of blood flow to viable lung segments.  There is stable cardiac prominence. Pulmonary vascularity grossly normal. There is aortic atherosclerosis. No appreciable adenopathy by radiography. No bone lesions. IMPRESSION: PleurX catheter again noted on the right with hydropneumothorax. Stable left pleural effusion with atelectasis left base. There is also atelectasis on the right. Stable cardiac prominence. Aortic Atherosclerosis (ICD10-I70.0). Electronically Signed   By: Lowella Grip III M.D.   On: 10/04/2020 08:02   DG CHEST PORT 1  VIEW  Result Date: 10/03/2020 CLINICAL DATA:  Small-cell lung cancer, pleural effusions, right PleurX chest catheter EXAM: PORTABLE CHEST 1 VIEW COMPARISON:  10/02/2020 FINDINGS: Stable right chest PleurX catheter position. Unchanged moderate right pneumothorax and residual right pleural effusion. Stable bilateral airspace disease/consolidation pattern. Unchanged left effusion. Heart is enlarged but the contours are obscured. Aorta atherosclerotic.  Degenerative changes of the spine. IMPRESSION: Stable moderate right hydropneumothorax related to incomplete re-expansion of the right lung related to underlying malignancy. Stable right PleurX chest tube position Unchanged bilateral consolidative airspace process and left pleural effusion as well. Electronically Signed   By: Jerilynn Mages.  Shick M.D.   On: 10/03/2020 15:41   DG CHEST PORT 1 VIEW  Result Date: 10/03/2020 CLINICAL DATA:  Shortness of breath.  Pneumothorax EXAM: PORTABLE CHEST 1 VIEW COMPARISON:  October 02, 2020 FINDINGS: Chest tube remains on the right with moderate pneumothorax on the right which appears loculated superiorly, similar to 1 day prior. There is now effusion on the right with apparent hydropneumothorax on the right currently. There is consolidation in the left base with small left pleural effusion. There is a stable medial left upper lobe mass. Heart is upper normal in size with pulmonary vascularity grossly normal. There is aortic atherosclerosis. No bone lesions. There is carotid artery calcification bilaterally. IMPRESSION: PleurX catheter now present on the right with reaccumulation of pleural fluid in the right base. Pneumothorax remains on the right which may be partially loculated. No tension component. Consolidation left lower lobe with small left pleural effusion, stable. Left medial upper lobe/apical mass, grossly stable. Stable cardiac silhouette. Carotid artery calcification bilaterally. Aortic Atherosclerosis (ICD10-I70.0).  Electronically Signed   By: Lowella Grip III M.D.   On: 10/03/2020 07:56   DG Chest Port 1 View  Result Date: 10/02/2020 CLINICAL DATA:  Right hydropneumothorax follow-up status post PleurX catheter placement. EXAM: PORTABLE CHEST 1 VIEW COMPARISON:  Chest x-ray from same day at 0419 hours. FINDINGS: Interval placement of a right-sided PleurX catheter with essentially resolved right pleural effusion. Decreased now moderate right pneumothorax with partial re-expansion of the right lung. Patchy consolidation in the right lung related to underlying tumor. Unchanged left apical paramediastinal mass and multiple pulmonary nodules in the left lung. Unchanged small left pleural effusion. Stable heart size. No acute osseous abnormality. IMPRESSION: 1. Interval placement of a right-sided PleurX catheter with essentially resolved right pleural effusion, decreased now moderate right pneumothorax, and partial re-expansion of the right lung. 2. Unchanged pulmonary metastatic disease. Electronically Signed   By: Titus Dubin M.D.   On: 10/02/2020 16:27   DG CHEST PORT 1 VIEW  Result Date: 10/02/2020 CLINICAL DATA:  Dyspnea EXAM: PORTABLE CHEST 1 VIEW COMPARISON:  10/01/2020 FINDINGS: Large right hydropneumothorax is again identified with progressive, now near complete, collapse of the right lung. Small left pleural effusion is unchanged. Multiple peripheral pulmonary nodules are again identified within the left lung. Left apical paramediastinal mass again identified. Cardiac size within normal limits. No acute bone abnormality. IMPRESSION: Large right hydropneumothorax with progressive, now near complete collapse of the right lung.  Multiple pulmonary nodules within the left lung in keeping with pulmonary metastatic disease. Stable small left pleural effusion. Electronically Signed   By: Fidela Salisbury MD   On: 10/02/2020 05:18   DG CHEST PORT 1 VIEW  Result Date: 10/01/2020 CLINICAL DATA:  Pneumothorax.   Shortness of breath. EXAM: PORTABLE CHEST 1 VIEW COMPARISON:  09/30/2020 FINDINGS: Slight interval increase in the moderate right hydropneumothorax. Persistent retrocardiac left base collapse/consolidation with probable small left pleural effusion. Left paramediastinal opacity in the upper left chest is compatible with the patient's known mass lesion. Bones are diffusely demineralized. IMPRESSION: 1. Slight interval increase in the moderate right hydropneumothorax. 2. Otherwise stable exam. Electronically Signed   By: Misty Stanley M.D.   On: 10/01/2020 11:00   DG Chest Port 1 View  Result Date: 09/30/2020 CLINICAL DATA:  87 year old female with recurrent right-sided pleural effusion secondary to known advanced small cell carcinoma, referred for thoracentesis EXAM: PORTABLE CHEST 1 VIEW COMPARISON:  CT 09/27/2020, chest x-ray 08/14/2020, chest x-ray 09/30/2020 FINDINGS: Cardiomediastinal silhouette likely unchanged, borderline enlarged heart border. Right-sided hydropneumothorax status post right-sided thoracentesis with significant reduction in the volume right-sided pleural fluid. Consolidative changes in the right lung, compatible with known pleural/parenchymal metastatic disease. Opacity at the left lung apex, suprahilar region, compatible with known mediastinal/pleural involvement on prior CT. Nodular changes of the left lung compatible with known malignancy and better seen on prior CT. IMPRESSION: Right-sided ex vacuo hydropneumothorax is favored after thoracentesis performed. Consolidative changes of the right lung in this patient with known metastatic small cell carcinoma. Left apical mass with nodular changes, compatible with known malignancy. Electronically Signed   By: Corrie Mckusick D.O.   On: 09/30/2020 12:01   IR PERC PLEURAL DRAIN W/INDWELL CATH W/IMG GUIDE  Result Date: 10/02/2020 INDICATION: 87 year old with history of small cell lung cancer and recurrent malignant pleural effusion. Patient  presented with respiratory distress and a large right pleural effusion. Thoracentesis was performed and the follow-up chest radiograph demonstrated a ex vacuo pneumothorax compatible with a trapped right lung. Subsequent chest radiograph demonstrated increased collapse of the right lung. Patient is receiving palliative care and primary service requested placement of a tunneled pleural catheter. EXAM: PLACEMENT OF RIGHT CHEST TUNNELED PLEURAL CATHETER WITH ULTRASOUND AND FLUOROSCOPIC GUIDANCE MEDICATIONS: Moderate sedation ANESTHESIA/SEDATION: Fentanyl 50 mcg IV; Versed 0.5 mg IV Moderate Sedation Time:  25 minutes The patient was continuously monitored during the procedure by the interventional radiology nurse under my direct supervision. FLUOROSCOPY TIME:  42 seconds, 14 mGy COMPLICATIONS: None immediate. PROCEDURE: Informed consent was obtained for a tunneled pleural catheter. A timeout was performed prior to the initiation of the procedure. Patient was placed on the interventional table. Ultrasound demonstrated a loculated right pleural effusion along with right pleural air. The right side of the chest was prepped and draped in sterile fashion. Maximal barrier sterile technique was utilized including caps, mask, sterile gowns, sterile gloves, sterile drape, hand hygiene and skin antiseptic. Skin was anesthetized using 1% lidocaine. Small incision was made along the right lateral lower chest. Using ultrasound guidance, a Yueh catheter was directed into the loculated pleural effusion and amber colored fluid was aspirated. A second incision was made more medially in the right lower chest. PleurX catheter was tunneled between the 2 incisions. The cuff was placed underneath the skin. A superstiff Amplatz wire was advanced into the right pleural space using fluoroscopic guidance. Catheter was cut to an appropriate length. The peel-away sheath was placed over the wire and the PleurX  catheter was advanced into the  pleural space. 550 mL of amber/blood tinged fluid was removed from the right pleural space along with a large amount of air. The pleural entrance skin site was closed using absorbable suture and Dermabond. Catheter exit site was secured to skin with suture. Dressing was placed. FINDINGS: Ultrasound demonstrated a loculated right pleural effusion. Large amount of pleural air on fluoroscopy. PleurX catheter was successfully placed in the right pleural space and 550 mL of fluid was removed. IMPRESSION: Successful placement of a right chest tunneled pleural catheter using ultrasound and fluoroscopic guidance. Portable chest radiograph was performed following catheter placement and demonstrated improved aeration in the right lung but a persistent large right pneumothorax. This is compatible with an ex vacuo pneumothorax and trapped right lung secondary to the patient's malignancy. Suspect this is the baseline aeration for the right lung. Electronically Signed   By: Markus Daft M.D.   On: 10/02/2020 17:19   US THORACENTESIS ASP PLEURAL SPACE W/IMG GUIDE  Result Date: 09/30/2020 INDICATION: 87 year old female with a history of small cell carcinoma and recurrent right-sided pleural effusion EXAM: ULTRASOUND GUIDED RIGHT THORACENTESIS MEDICATIONS: None. COMPLICATIONS: None PROCEDURE: An ultrasound guided thoracentesis was thoroughly discussed with the patient and questions answered. The benefits, risks, alternatives and complications were also discussed. The patient understands and wishes to proceed with the procedure. Written consent was obtained. Ultrasound was performed to localize and mark an adequate pocket of fluid in the right chest. The area was then prepped and draped in the normal sterile fashion. 1% Lidocaine was used for local anesthesia. Under ultrasound guidance a 8 Fr Safe-T-Centesis catheter was introduced. Thoracentesis was performed. The catheter was removed when the patient complained of some chest  discomfort and a dressing applied. FINDINGS: A total of approximately 900 cc of thin amber fluid was removed. IMPRESSION: Status post ultrasound-guided right-sided thoracentesis. Signed, Dulcy Fanny. Dellia Nims, RPVI [facility] Electronically Signed   By: Corrie Mckusick D.O.   On: 09/30/2020 13:11     Subjective: No new complaints. Symptoms controlled. Family in agreement to hospice transfer  Discharge Exam: Vitals:   10/04/20 0827 10/04/20 0830  BP:  126/68  Pulse:  84  Resp:    Temp:    SpO2: 94%    General: Chronically ill-appearing female in no acute distress.  Labs: BNP (last 3 results) No results for input(s): BNP in the last 8760 hours. Basic Metabolic Panel: Recent Labs  Lab 10/01/20 0327 10/01/20 1431 10/02/20 0323 10/03/20 0616 10/04/20 0335  NA 125* 129* 128* 130* 134*  K 2.9* 3.2* 3.6 3.9 3.2*  CL 80* 82* 82* 81* 79*  CO2 31 36* 34* 35* 40*  GLUCOSE 93 140* 108* 152* 129*  BUN 7* 8 8 10 13   CREATININE 0.37* 0.65 0.43* 0.48 0.61  CALCIUM 8.4* 8.6* 9.1 9.0 9.3  MG  --   --  1.1* 1.5* 1.8  PHOS  --   --  2.3* 3.9 2.8   Liver Function Tests: Recent Labs  Lab 09/30/20 0145 10/02/20 0323 10/03/20 0616 10/04/20 0335  AST 58* 24 23 24   ALT 12 11 12 14   ALKPHOS 72 64 67 68  BILITOT 2.1* 0.6 0.6 0.9  PROT 6.2* 5.9* 6.2* 6.5  ALBUMIN 2.9* 2.8* 2.9* 3.0*   No results for input(s): LIPASE, AMYLASE in the last 168 hours. No results for input(s): AMMONIA in the last 168 hours. CBC: Recent Labs  Lab 09/30/20 0145 10/01/20 0327 10/02/20  1025 10/03/20 0616 10/04/20 0335  WBC 8.8 9.3 7.8 14.9* 17.3*  NEUTROABS 7.1  --  5.7 13.0* 15.7*  HGB 10.0* 9.6* 9.5* 10.2* 10.2*  HCT 31.4* 31.1* 31.1* 34.2* 33.5*  MCV 89.7 91.5 93.1 94.5 93.8  PLT 376 301 346 357 372   Cardiac Enzymes: No results for input(s): CKTOTAL, CKMB, CKMBINDEX, TROPONINI in the last 168 hours. BNP: Invalid input(s): POCBNP CBG: No  results for input(s): GLUCAP in the last 168 hours. D-Dimer No results for input(s): DDIMER in the last 72 hours. Hgb A1c No results for input(s): HGBA1C in the last 72 hours. Lipid Profile No results for input(s): CHOL, HDL, LDLCALC, TRIG, CHOLHDL, LDLDIRECT in the last 72 hours. Thyroid function studies No results for input(s): TSH, T4TOTAL, T3FREE, THYROIDAB in the last 72 hours.  Invalid input(s): FREET3 Anemia work up No results for input(s): VITAMINB12, FOLATE, FERRITIN, TIBC, IRON, RETICCTPCT in the last 72 hours. Urinalysis    Component Value Date/Time   COLORURINE YELLOW 12/23/2018 2140   APPEARANCEUR HAZY (A) 12/23/2018 2140   LABSPEC 1.013 12/23/2018 2140   PHURINE 6.0 12/23/2018 2140   GLUCOSEU NEGATIVE 12/23/2018 2140   HGBUR SMALL (A) 12/23/2018 2140   BILIRUBINUR NEGATIVE 12/23/2018 2140   KETONESUR NEGATIVE 12/23/2018 2140   PROTEINUR NEGATIVE 12/23/2018 2140   UROBILINOGEN 0.2 07/21/2014 1356   NITRITE POSITIVE (A) 12/23/2018 2140   LEUKOCYTESUR MODERATE (A) 12/23/2018 2140    Microbiology Recent Results (from the past 240 hour(s))  SARS CORONAVIRUS 2 (TAT 6-24 HRS) Nasopharyngeal Nasopharyngeal Swab     Status: None   Collection Time: 09/29/20  1:31 PM   Specimen: Nasopharyngeal Swab  Result Value Ref Range Status   SARS Coronavirus 2 NEGATIVE NEGATIVE Final    Comment: (NOTE) SARS-CoV-2 target nucleic acids are NOT DETECTED.  The SARS-CoV-2 RNA is generally detectable in upper and lower respiratory specimens during the acute phase of infection. Negative results do not preclude SARS-CoV-2 infection, do not rule out co-infections with other pathogens, and should not be used as the sole basis for treatment or other patient management decisions. Negative results must be combined with clinical observations, patient history, and epidemiological information. The expected result is Negative.  Fact Sheet for  Patients: SugarRoll.be  Fact Sheet for Healthcare Providers: https://www.woods-mathews.com/  This test is not yet approved or cleared by the [place] FDA and  has been authorized for detection and/or diagnosis of SARS-CoV-2 by FDA under an Emergency Use Authorization (EUA). This EUA will remain  in effect (meaning this test can be used) for the duration of the COVID-19 declaration under Se ction 564(b)(1) of the Act, 21 U.S.C. section 360bbb-3(b)(1), unless the authorization is terminated or revoked sooner.  Performed at [facility], [place] [address] [place], [place] [address]   Resp Panel by RT-PCR (Flu A&B, Covid) Nasopharyngeal Swab     Status: None   Collection Time: 09/30/20  2:26 AM   Specimen: Nasopharyngeal Swab; Nasopharyngeal(NP) swabs in vial transport medium  Result Value Ref Range Status   SARS Coronavirus 2 by RT PCR NEGATIVE NEGATIVE Final    Comment: (NOTE) SARS-CoV-2 target nucleic acids are NOT DETECTED.  The SARS-CoV-2 RNA is generally detectable in upper respiratory specimens during the acute phase of infection. The lowest concentration of SARS-CoV-2 viral copies this assay can detect is 138 copies/mL. A negative result does not preclude SARS-Cov-2 infection and should not be used as the sole basis for treatment or other patient management decisions. A negative result  may occur with  improper specimen collection/handling, submission of specimen other than nasopharyngeal swab, presence of viral mutation(s) within the areas targeted by this assay, and inadequate number of viral copies(<138 copies/mL). A negative result must be combined with clinical observations, patient history, and epidemiological information. The expected result is Negative.  Fact Sheet for Patients:  EntrepreneurPulse.com.au  Fact Sheet for Healthcare Providers:  IncredibleEmployment.be  This test is  no t yet approved or cleared by the [place] FDA and  has been authorized for detection and/or diagnosis of SARS-CoV-2 by FDA under an Emergency Use Authorization (EUA). This EUA will remain  in effect (meaning this test can be used) for the duration of the COVID-19 declaration under Section 564(b)(1) of the Act, 21 U.S.C.section 360bbb-3(b)(1), unless the authorization is terminated  or revoked sooner.       Influenza A by PCR NEGATIVE NEGATIVE Final   Influenza B by PCR NEGATIVE NEGATIVE Final    Comment: (NOTE) The Xpert Xpress SARS-CoV-2/FLU/RSV plus assay is intended as an aid in the diagnosis of influenza from Nasopharyngeal swab specimens and should not be used as a sole basis for treatment. Nasal washings and aspirates are unacceptable for Xpert Xpress SARS-CoV-2/FLU/RSV testing.  Fact Sheet for Patients: EntrepreneurPulse.com.au  Fact Sheet for Healthcare Providers: IncredibleEmployment.be  This test is not yet approved or cleared by the [place] FDA and has been authorized for detection and/or diagnosis of SARS-CoV-2 by FDA under an Emergency Use Authorization (EUA). This EUA will remain in effect (meaning this test can be used) for the duration of the COVID-19 declaration under Section 564(b)(1) of the Act, 21 U.S.C. section 360bbb-3(b)(1), unless the authorization is terminated or revoked.  Performed at [facility], [place] [address] [place], [place] [address]     Time coordinating discharge: Approximately 40 minutes  Patrecia Pour, MD  [facility] 10/04/2020, 10:17 AM

## 2020-10-04 NOTE — TOC Transition Note (Signed)
Transition of Care [facility]) - CM/SW Discharge Note   Patient Details  Name: Mary Merritt MRN: [id number] Date of Birth: 10/25/1927  [facility] CM/SW Contact:  Ross Ludwig, LCSW Phone Number: 10/04/2020, 10:31 AM   Clinical Narrative:     Patient to be d/c'ed today to [facility].  Patient and family agreeable to plans will transport via ems RN to call report to [phone].     Final next level of care: [place] Barriers to Discharge: Barriers Resolved   Patient Goals and CMS Choice Patient states their goals for this hospitalization and ongoing recovery are:: To go to [facility] for end of life care. CMS Medicare.gov Compare Post Acute Care list provided to:: Patient Represenative (must comment) Choice offered to / list presented to : Adult Children  Discharge Placement                Patient to be transferred to facility by: [facility] Name of family member notified: [name] is aware of patient discharging today. Patient and family notified of of transfer: 10/04/20  Discharge Plan and Services   Discharge Planning Services: CM Consult                                 Social Determinants of Health (SDOH) Interventions     Readmission Risk Interventions No flowsheet data found.

## 2020-10-05 ENCOUNTER — Inpatient Hospital Stay: Admission: RE | Admit: 2020-10-05 | Payer: Medicare Other | Source: Ambulatory Visit

## 2020-10-07 DIAGNOSIS — C3411 Malignant neoplasm of upper lobe, right bronchus or lung: Secondary | ICD-10-CM | POA: Diagnosis not present

## 2020-10-19 ENCOUNTER — Ambulatory Visit: Payer: Medicare Other | Admitting: Oncology

## 2020-10-22 DEATH — deceased

## 2021-04-05 ENCOUNTER — Ambulatory Visit: Payer: Medicare Other | Admitting: Orthopaedic Surgery

## 2021-04-12 ENCOUNTER — Ambulatory Visit: Payer: Medicare Other | Admitting: Orthopaedic Surgery

## 2021-08-22 IMAGING — CT CT ANGIO CHEST
2 of 6 series · 18 of 36 positions shown · IV contrast (omnipaque)
Comparison: Chest CT 05/15/2020

CLINICAL DATA: Chest pain or shortness of breath.  Lung masses

EXAM:
CT ANGIOGRAPHY CHEST WITH CONTRAST
TECHNIQUE: Multidetector CT imaging of the chest was performed using the
standard protocol during bolus administration of intravenous
contrast. Multiplanar CT image reconstructions and MIPs were
obtained to evaluate the vascular anatomy.
CONTRAST:  75mL OMNIPAQUE IOHEXOL 350 MG/ML SOLN

[Series 5: thins · axial · 0.78mm/px · z∈[-226,+20]mm · 17 of 278 slices shown]
[im 16/278  lung]
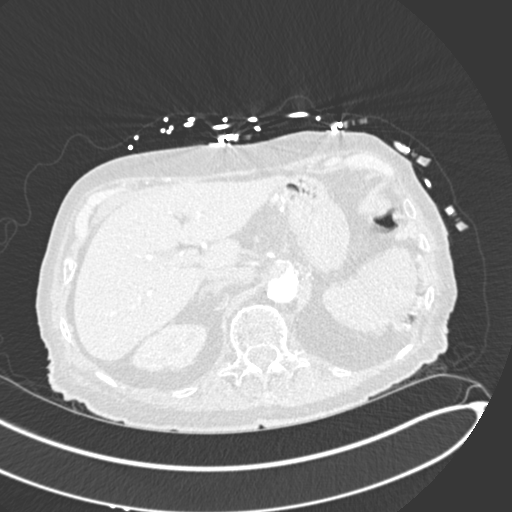
[im 31/278  mediastinal]
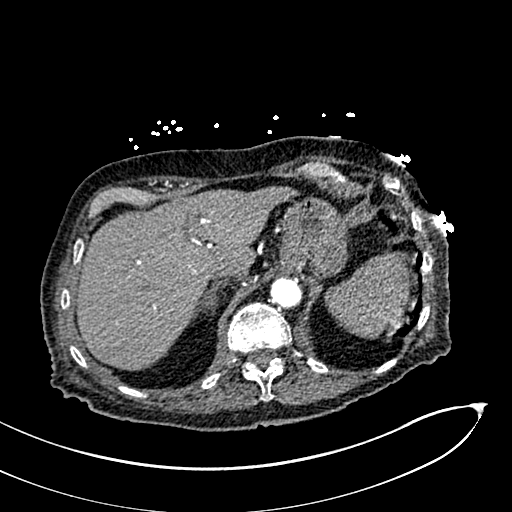
[im 47/278  lung]
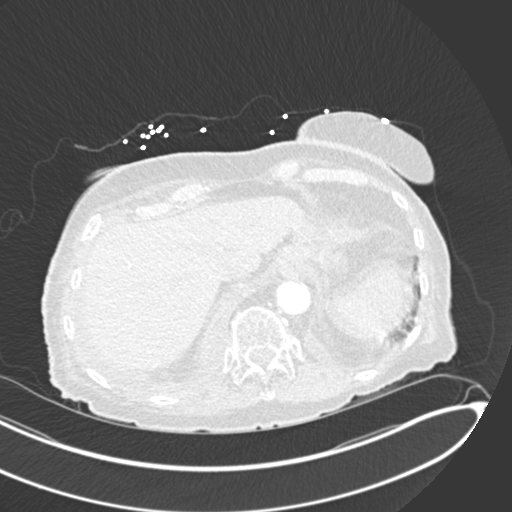
[im 62/278  mediastinal]
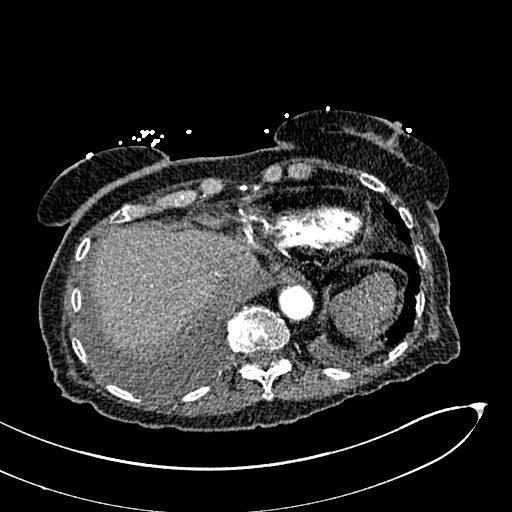
[im 77/278  lung]
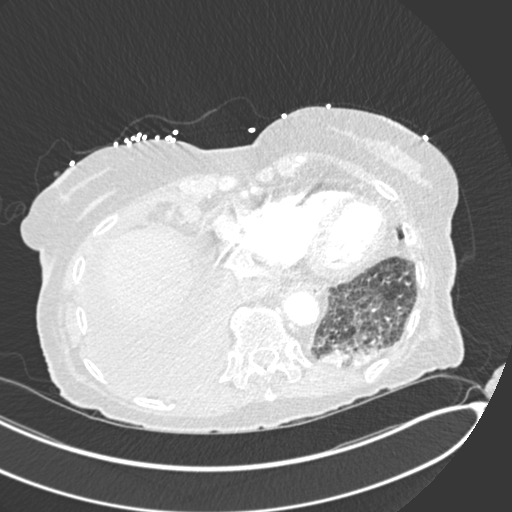
[im 93/278  mediastinal]
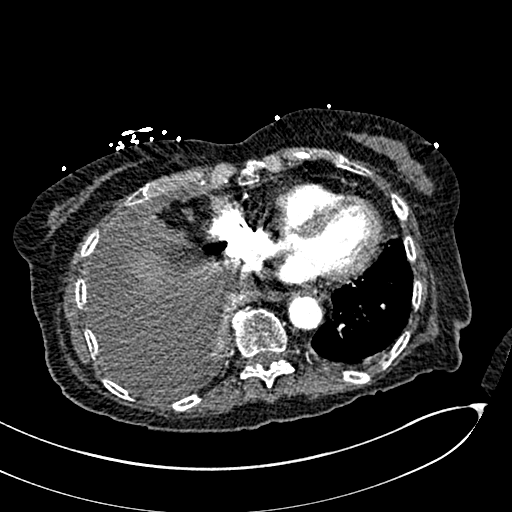
[im 108/278  lung]
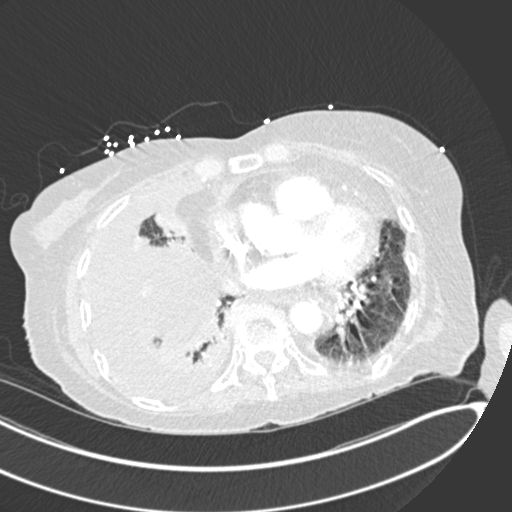
[im 124/278  mediastinal]
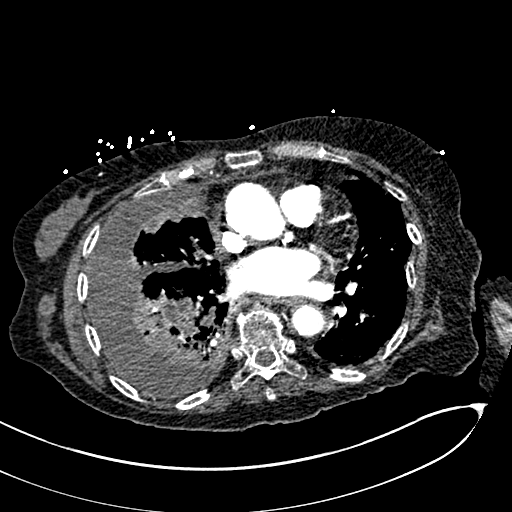
[im 139/278  lung]
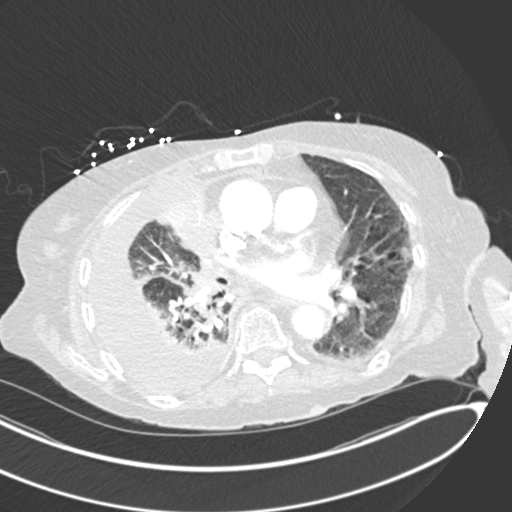
[im 154/278  mediastinal]
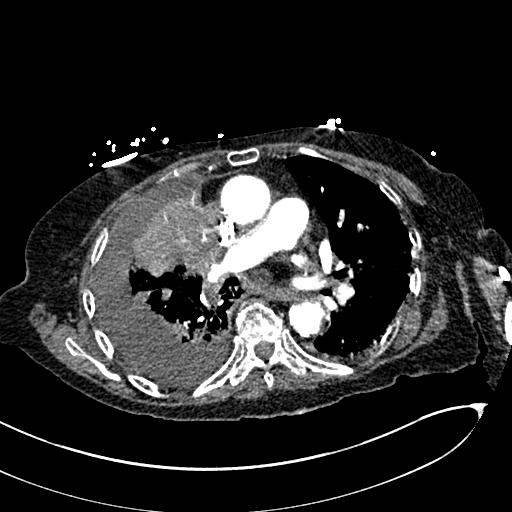
[im 170/278  lung]
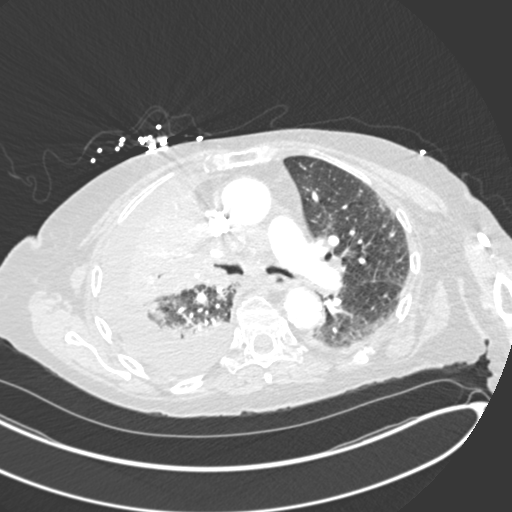
[im 185/278  mediastinal]
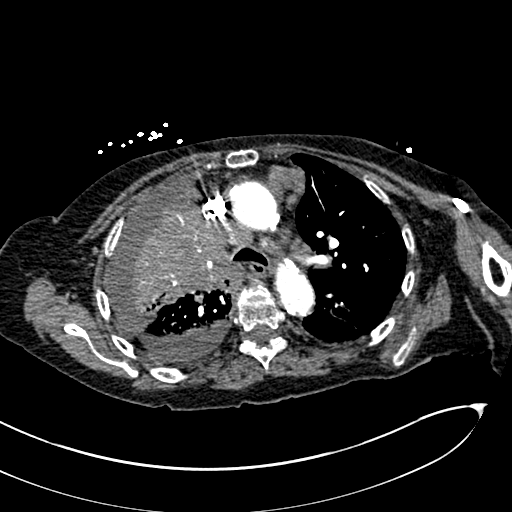
[im 201/278  lung]
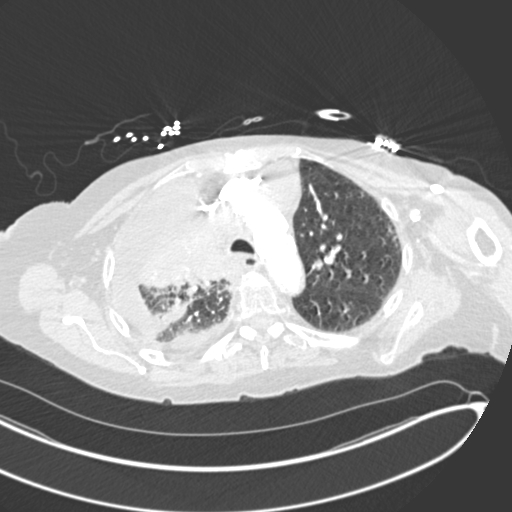
[im 216/278  mediastinal]
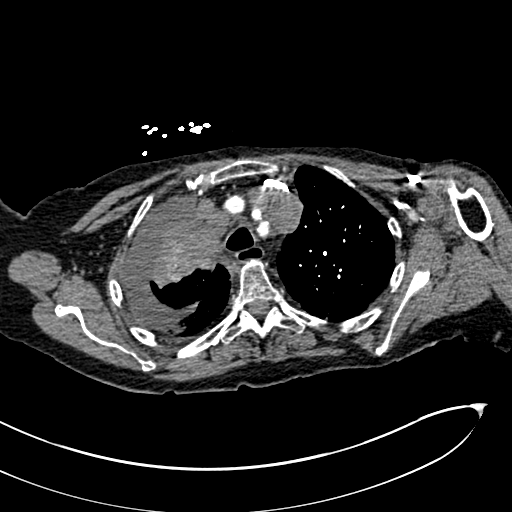
[im 231/278  lung]
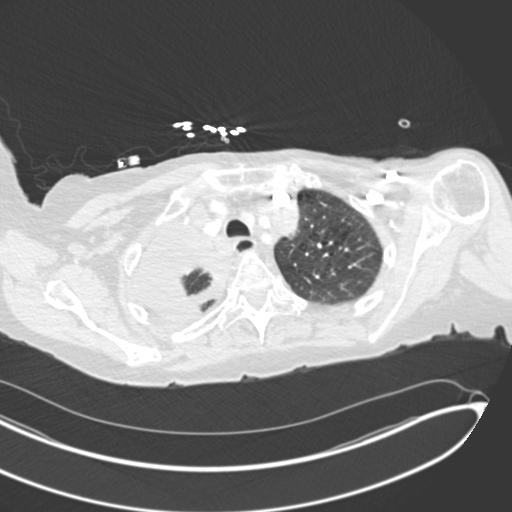
[im 247/278  mediastinal]
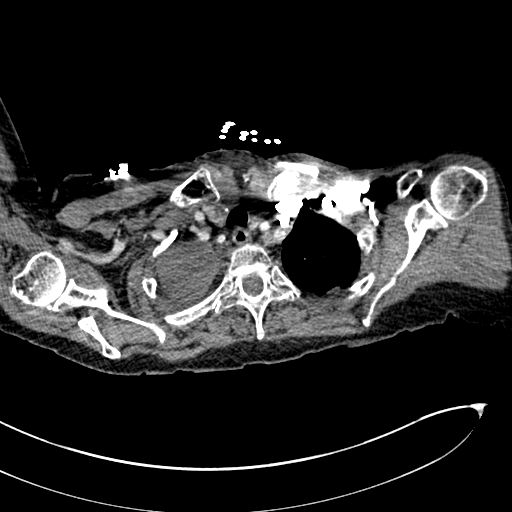
[im 262/278  lung]
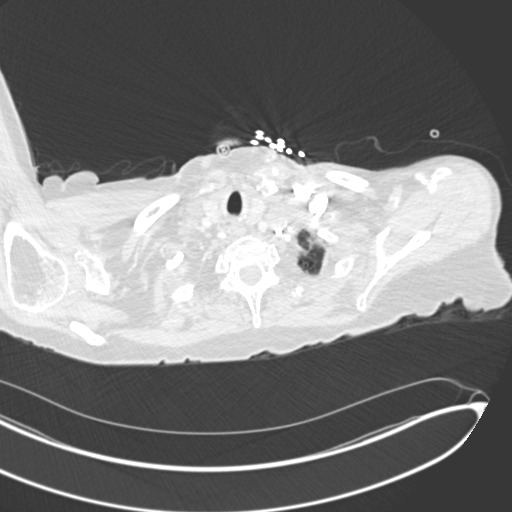

[Series 7: coronal mpr · coronal · 0.60mm/px · 1 of 129 slices shown]
[im 65/129  mediastinal]
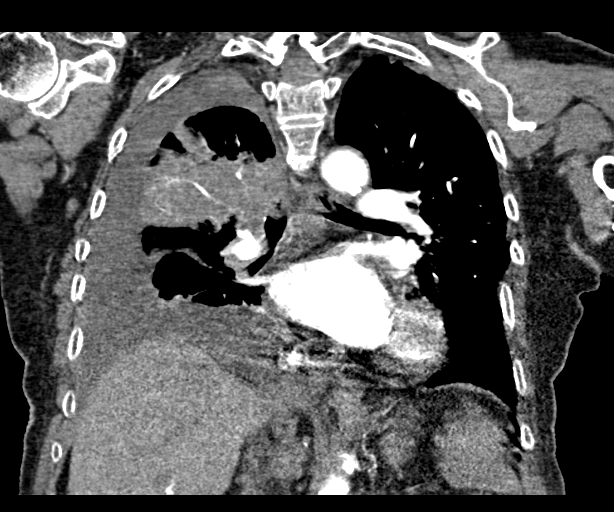

[18 of 36 positions shown; findings below may reference images not displayed]

FINDINGS: Cardiovascular: Contrast injection is sufficient to demonstrate
satisfactory opacification of the pulmonary arteries to the
segmental level. There is no pulmonary embolus or evidence of right
heart strain. The size of the main pulmonary artery is normal. Heart
size is normal, with no pericardial effusion. The course and caliber
of the aorta are normal. There is mild atherosclerosis. No acute
finding.

Mediastinum/Nodes: Unchanged appearance of large prevascular node
measuring 2.5 cm.

Lungs/Pleura: Large tumor in the right lung with surrounding pleural
fluid. There is pleural nodularity along the right minor fissure.
Multiple nodular opacities at the right lower lobe. Tumor surrounds
the right mainstem bronchus and severely narrows the right upper
lobe bronchus. The right pulmonary artery is also encased. Tumor
size is increased.

Upper Abdomen: Contrast bolus timing is not optimized for evaluation
of the abdominal organs. The visualized portions of the organs of
the upper abdomen are normal.

Musculoskeletal: No chest wall abnormality. No bony spinal canal
stenosis.

Review of the MIP images confirms the above findings.
IMPRESSION: 1. No pulmonary embolus or acute aortic syndrome.
2. Large tumor in the right lung with surrounding pleural fluid.
Tumor surrounds the right mainstem bronchus and severely narrows the
right upper lobe bronchus. The right pulmonary artery is also
encased.
3. Multiple nodular opacities at the right lung base, concerning for
metastatic disease versus areas of consolidation.
4. Unchanged appearance of large prevascular node.

Aortic Atherosclerosis ([id number]).
